# Patient Record
Sex: Female | Born: 1940
Health system: Southern US, Community
[De-identification: ages and names within clinical notes are randomized; demographics above are authoritative.]

## PROBLEM LIST (undated history)

## (undated) DIAGNOSIS — G8929 Other chronic pain: Secondary | ICD-10-CM

## (undated) DIAGNOSIS — E785 Hyperlipidemia, unspecified: Secondary | ICD-10-CM

## (undated) DIAGNOSIS — K219 Gastro-esophageal reflux disease without esophagitis: Secondary | ICD-10-CM

## (undated) DIAGNOSIS — M545 Low back pain, unspecified: Secondary | ICD-10-CM

## (undated) DIAGNOSIS — M810 Age-related osteoporosis without current pathological fracture: Secondary | ICD-10-CM

## (undated) DIAGNOSIS — C801 Malignant (primary) neoplasm, unspecified: Secondary | ICD-10-CM

## (undated) DIAGNOSIS — J449 Chronic obstructive pulmonary disease, unspecified: Secondary | ICD-10-CM

## (undated) DIAGNOSIS — I10 Essential (primary) hypertension: Secondary | ICD-10-CM

## (undated) DIAGNOSIS — R911 Solitary pulmonary nodule: Secondary | ICD-10-CM

## (undated) HISTORY — PX: ABDOMINAL HYSTERECTOMY: SHX81

## (undated) HISTORY — DX: Other chronic pain: G89.29

## (undated) HISTORY — DX: Chronic obstructive pulmonary disease, unspecified: J44.9

## (undated) HISTORY — DX: Essential (primary) hypertension: I10

## (undated) HISTORY — PX: TOTAL HIP ARTHROPLASTY: SHX124

## (undated) HISTORY — DX: Low back pain: M54.5

## (undated) HISTORY — DX: Low back pain, unspecified: M54.50

## (undated) HISTORY — DX: Age-related osteoporosis without current pathological fracture: M81.0

## (undated) HISTORY — DX: Hyperlipidemia, unspecified: E78.5

## (undated) HISTORY — DX: Gastro-esophageal reflux disease without esophagitis: K21.9

---

## 2012-12-15 ENCOUNTER — Telehealth: Payer: Self-pay | Admitting: Nurse Practitioner

## 2012-12-15 NOTE — Telephone Encounter (Signed)
Mmm to address 

## 2012-12-15 NOTE — Telephone Encounter (Signed)
Have patient call pharmacy and send fax- no list of meds in epic to order

## 2012-12-15 NOTE — Telephone Encounter (Signed)
Pt will have pharmacy send over request for refills.

## 2012-12-16 ENCOUNTER — Other Ambulatory Visit: Payer: Self-pay | Admitting: Nurse Practitioner

## 2013-01-21 ENCOUNTER — Encounter: Payer: Self-pay | Admitting: Nurse Practitioner

## 2013-01-21 ENCOUNTER — Ambulatory Visit (INDEPENDENT_AMBULATORY_CARE_PROVIDER_SITE_OTHER): Payer: Medicare Other | Admitting: Nurse Practitioner

## 2013-01-21 VITALS — BP 141/76 | HR 98 | Temp 98.0°F | Ht 61.0 in | Wt 180.0 lb

## 2013-01-21 DIAGNOSIS — I10 Essential (primary) hypertension: Secondary | ICD-10-CM | POA: Insufficient documentation

## 2013-01-21 MED ORDER — AMLODIPINE BESYLATE 2.5 MG PO TABS: 2.5000 mg | ORAL_TABLET | Freq: Every day | ORAL | Status: DC

## 2013-01-21 NOTE — Patient Instructions (Addendum)

## 2013-01-21 NOTE — Progress Notes (Signed)
  Subjective:    Patient ID: Jacqueline Kidd, female    DOB: [birth year], 72 y.o.   MRN: [id number]  Hypertension This is a chronic problem. The current episode started more than 1 year ago. The problem has been gradually improving since onset. The problem is controlled. Associated symptoms include palpitations. Pertinent negatives include no anxiety, blurred vision or shortness of breath. Risk factors for coronary artery disease include post-menopausal state. Past treatments include calcium channel blockers. The current treatment provides moderate improvement. There is no history of heart failure or a thyroid problem.      Review of Systems  Eyes: Negative for blurred vision.  Respiratory: Negative for shortness of breath.   Cardiovascular: Positive for palpitations.  All other systems reviewed and are negative.       Objective:   Physical Exam  Constitutional: She is oriented to person, place, and time. She appears well-developed and well-nourished.  HENT:  Head: Normocephalic.  Right Ear: External ear normal.  Left Ear: External ear normal.  Mouth/Throat: Oropharynx is clear and moist.  Eyes: Pupils are equal, round, and reactive to light.  Neck: Normal range of motion. Neck supple. No thyromegaly present.  Cardiovascular: Normal rate, regular rhythm, normal heart sounds and intact distal pulses.   Pulmonary/Chest: Effort normal and breath sounds normal.  Abdominal: Soft. Bowel sounds are normal. She exhibits no distension. There is no tenderness.  Musculoskeletal: Normal range of motion. She exhibits edema (trace amt of on LLE). She exhibits no tenderness.  Neurological: She is alert and oriented to person, place, and time.  Skin: Skin is warm and dry.  Psychiatric: She has a normal mood and affect. Her behavior is normal. Judgment and thought content normal.     BP 141/76  Pulse 98  Temp(Src) 98 F (36.7 C) (Oral)  Ht 5\' 1"  (1.549 m)  Wt 180 lb (81.647 kg)  BMI 34.03  kg/m2      Assessment & Plan:  1. Hypertension Low Na+ diet Low fat diet and exercise - amLODipine (NORVASC) 2.5 MG tablet; Take 1 tablet (2.5 mg total) by mouth daily.  Dispense: 90 tablet; Refill: 1 - COMPLETE METABOLIC PANEL WITH GFR - NMR Lipoprofile with Lipids  Mary-Margaret Daphine Deutscher, FNP

## 2013-01-22 LAB — COMPLETE METABOLIC PANEL WITH GFR
AST: 20 U/L (ref 0–37)
Albumin: 4.2 g/dL (ref 3.5–5.2)
Alkaline Phosphatase: 91 U/L (ref 39–117)
BUN: 9 mg/dL (ref 6–23)
Creat: 0.78 mg/dL (ref 0.50–1.10)
GFR, Est Non African American: 76 mL/min
Glucose, Bld: 120 mg/dL — ABNORMAL HIGH (ref 70–99)
Total Bilirubin: 0.6 mg/dL (ref 0.3–1.2)

## 2013-01-25 LAB — NMR LIPOPROFILE WITH LIPIDS
Cholesterol, Total: 171 mg/dL (ref <200)
HDL Size: 8.8 nm — ABNORMAL LOW (ref >=9.2)
LDL (calc): 83 mg/dL (ref <100)
LDL Particle Number: 1204 nmol/L — ABNORMAL HIGH (ref <1000)
LP-IR Score: 57 — ABNORMAL HIGH (ref <=45)
Large VLDL-P: 4.3 nmol/L — ABNORMAL HIGH (ref <=2.7)
Triglycerides: 230 mg/dL — ABNORMAL HIGH (ref <150)
VLDL Size: 43.8 nm (ref <=46.6)

## 2013-09-14 ENCOUNTER — Other Ambulatory Visit: Payer: Self-pay | Admitting: Nurse Practitioner

## 2013-09-16 NOTE — Telephone Encounter (Signed)
Last seen 07/14

## 2013-09-16 NOTE — Telephone Encounter (Signed)
Patient NTBS for follow up and lab work  

## 2013-12-13 ENCOUNTER — Telehealth: Payer: Self-pay | Admitting: Nurse Practitioner

## 2013-12-13 NOTE — Telephone Encounter (Signed)
Appt scheduled. Patient aware. 

## 2013-12-21 ENCOUNTER — Ambulatory Visit (INDEPENDENT_AMBULATORY_CARE_PROVIDER_SITE_OTHER): Payer: Medicare Other | Admitting: Nurse Practitioner

## 2013-12-21 ENCOUNTER — Encounter: Payer: Self-pay | Admitting: Nurse Practitioner

## 2013-12-21 VITALS — BP 132/70 | HR 98 | Temp 98.9°F | Ht 61.0 in | Wt 175.4 lb

## 2013-12-21 DIAGNOSIS — I10 Essential (primary) hypertension: Secondary | ICD-10-CM

## 2013-12-21 DIAGNOSIS — Z713 Dietary counseling and surveillance: Secondary | ICD-10-CM

## 2013-12-21 DIAGNOSIS — Z6833 Body mass index (BMI) 33.0-33.9, adult: Secondary | ICD-10-CM

## 2013-12-21 DIAGNOSIS — Z13820 Encounter for screening for osteoporosis: Secondary | ICD-10-CM

## 2013-12-21 MED ORDER — AMLODIPINE BESYLATE 2.5 MG PO TABS: ORAL_TABLET | ORAL | Status: DC

## 2013-12-21 NOTE — Progress Notes (Signed)
  Subjective:    Patient ID: Jacqueline Kidd, female    DOB: 05-Nov-1940, 73 y.o.   MRN: [id number]  Patient here today for follow up of chronic medical problems. No complaints today.  Hypertension This is a chronic problem. The current episode started more than 1 year ago. The problem has been gradually improving since onset. The problem is controlled. Associated symptoms include palpitations. Pertinent negatives include no anxiety, blurred vision or shortness of breath. Risk factors for coronary artery disease include post-menopausal state. Past treatments include calcium channel blockers. The current treatment provides moderate improvement. There is no history of heart failure or a thyroid problem.      Review of Systems  Eyes: Negative for blurred vision.  Respiratory: Negative for shortness of breath.   Cardiovascular: Positive for palpitations.  All other systems reviewed and are negative.      Objective:   Physical Exam  Constitutional: She is oriented to person, place, and time. She appears well-developed and well-nourished.  HENT:  Head: Normocephalic.  Right Ear: External ear normal.  Left Ear: External ear normal.  Mouth/Throat: Oropharynx is clear and moist.  Eyes: Pupils are equal, round, and reactive to light.  Neck: Normal range of motion. Neck supple. No thyromegaly present.  Cardiovascular: Normal rate, regular rhythm, normal heart sounds and intact distal pulses.   Pulmonary/Chest: Effort normal and breath sounds normal.  Abdominal: Soft. Bowel sounds are normal. She exhibits no distension. There is no tenderness.  Musculoskeletal: Normal range of motion. She exhibits edema (trace amt of on LLE). She exhibits no tenderness.  Neurological: She is alert and oriented to person, place, and time.  Skin: Skin is warm and dry.  Psychiatric: She has a normal mood and affect. Her behavior is normal. Judgment and thought content normal.     BP 132/70  Pulse 98  Temp(Src)  98.9 F (37.2 C) (Oral)  Ht _0  (1.549 m)  Wt 175 lb 6.4 oz (79.561 kg)  BMI 33.16 kg/m2      Assessment & Plan:   1. Hypertension   2. Osteoporosis screening   3. BMI 33.0-33.9,adult   4. Weight loss counseling, encounter for    Orders Placed This Encounter  Procedures  . DG Bone Density    Standing Status: Future     Number of Occurrences:      Standing Expiration Date: 02/20/2015    Order Specific Question:  Reason for Exam (SYMPTOM  OR DIAGNOSIS REQUIRED)    Answer:  screening    Order Specific Question:  Preferred imaging location?    Answer:  Internal  . CMP14+EGFR  . NMR, lipoprofile   Meds ordered this encounter  Medications  . amLODipine (NORVASC) 2.5 MG tablet    Sig: TAKE 1 TABLET (2.5 MG TOTAL) BY MOUTH DAILY.    Dispense:  90 tablet    Refill:  1    Order Specific Question:  Supervising Provider    Answer:  Joycelyn Man   To schedule mammo on way out Labs pending Health maintenance reviewed Diet and exercise encouraged Continue all meds Follow up  In 3 months    [place], FNP

## 2013-12-21 NOTE — Patient Instructions (Signed)
Osteoporosis Throughout your life, your body breaks down old bone and replaces it with new bone. As you get older, your body does not replace bone as quickly as it breaks it down. By the age of 30 years, most people begin to gradually lose bone because of the imbalance between bone loss and replacement. Some people lose more bone than others. Bone loss beyond a specified normal degree is considered osteoporosis.  Osteoporosis affects the strength and durability of your bones. The inside of the ends of your bones and your flat bones, like the bones of your pelvis, look like honeycomb, filled with tiny open spaces. As bone loss occurs, your bones become less dense. This means that the open spaces inside your bones become bigger and the walls between these spaces become thinner. This makes your bones weaker. Bones of a person with osteoporosis can become so weak that they can break (fracture) during minor accidents, such as a simple fall. CAUSES  The following factors have been associated with the development of osteoporosis:  Smoking.  Drinking more than 2 alcoholic drinks several days per week.  Long-term use of certain medicines:  Corticosteroids.  Chemotherapy medicines.  Thyroid medicines.  Antiepileptic medicines.  Gonadal hormone suppression medicine.  Immunosuppression medicine.  Being underweight.  Lack of physical activity.  Lack of exposure to the sun. This can lead to vitamin D deficiency.  Certain medical conditions:  Certain inflammatory bowel diseases, such as Crohn disease and ulcerative colitis.  Diabetes.  Hyperthyroidism.  Hyperparathyroidism. RISK FACTORS Anyone can develop osteoporosis. However, the following factors can increase your risk of developing osteoporosis:  Gender--Women are at higher risk than men.  Age--Being older than 50 years increases your risk.  Ethnicity--White and Asian people have an increased risk.  Weight --Being extremely  underweight can increase your risk of osteoporosis.  Family history of osteoporosis--Having a family member who has developed osteoporosis can increase your risk. SYMPTOMS  Usually, people with osteoporosis have no symptoms.  DIAGNOSIS  Signs during a physical exam that may prompt your caregiver to suspect osteoporosis include:  Decreased height. This is usually caused by the compression of the bones that form your spine (vertebrae) because they have weakened and become fractured.  A curving or rounding of the upper back (kyphosis). To confirm signs of osteoporosis, your caregiver may request a procedure that uses 2 low-dose X-ray beams with different levels of energy to measure your bone mineral density (dual-energy X-ray absorptiometry [DXA]). Also, your caregiver may check your level of vitamin D. TREATMENT  The goal of osteoporosis treatment is to strengthen bones in order to decrease the risk of bone fractures. There are different types of medicines available to help achieve this goal. Some of these medicines work by slowing the processes of bone loss. Some medicines work by increasing bone density. Treatment also involves making sure that your levels of calcium and vitamin D are adequate. PREVENTION  There are things you can do to help prevent osteoporosis. Adequate intake of calcium and vitamin D can help you achieve optimal bone mineral density. Regular exercise can also help, especially resistance and weight-bearing activities. If you smoke, quitting smoking is an important part of osteoporosis prevention. MAKE SURE YOU:  Understand these instructions.  Will watch your condition.  Will get help right away if you are not doing well or get worse. FOR MORE INFORMATION www.osteo.org and www.nof.org Document Released: 04/02/2005 Document Revised: 10/18/2012 Document Reviewed: 06/07/2011 [employer] Patient Information 2015 [employer]. This information is not   intended to replace advice  given to you by your health care provider. Make sure you discuss any questions you have with your health care provider.  

## 2013-12-22 LAB — CMP14+EGFR
A/G RATIO: 1.4 (ref 1.1–2.5)
ALT: 15 IU/L (ref 0–32)
AST: 20 IU/L (ref 0–40)
Albumin: 4.3 g/dL (ref 3.5–4.8)
Alkaline Phosphatase: 93 IU/L (ref 39–117)
BILIRUBIN TOTAL: 0.4 mg/dL (ref 0.0–1.2)
BUN/Creatinine Ratio: 12 (ref 11–26)
BUN: 10 mg/dL (ref 8–27)
CO2: 24 mmol/L (ref 18–29)
CREATININE: 0.84 mg/dL (ref 0.57–1.00)
Calcium: 9.3 mg/dL (ref 8.7–10.3)
Chloride: 100 mmol/L (ref 97–108)
GFR, EST AFRICAN AMERICAN: 80 mL/min/{1.73_m2} (ref >59)
GFR, EST NON AFRICAN AMERICAN: 69 mL/min/{1.73_m2} (ref >59)
GLOBULIN, TOTAL: 3.1 g/dL (ref 1.5–4.5)
GLUCOSE: 102 mg/dL — AB (ref 65–99)
POTASSIUM: 4.2 mmol/L (ref 3.5–5.2)
Sodium: 141 mmol/L (ref 134–144)
TOTAL PROTEIN: 7.4 g/dL (ref 6.0–8.5)

## 2013-12-22 LAB — NMR, LIPOPROFILE
CHOLESTEROL: 158 mg/dL (ref 100–199)
HDL Cholesterol by NMR: 39 mg/dL — ABNORMAL LOW (ref >39)
HDL PARTICLE NUMBER: 30.2 umol/L — AB (ref >=30.5)
LDL Particle Number: 959 nmol/L (ref <1000)
LDL SIZE: 20.8 nm (ref >20.5)
LDLC SERPL CALC-MCNC: 77 mg/dL (ref 0–99)
LP-IR Score: 50 — ABNORMAL HIGH (ref <=45)
SMALL LDL PARTICLE NUMBER: 463 nmol/L (ref <=527)
TRIGLYCERIDES BY NMR: 212 mg/dL — AB (ref 0–149)

## 2014-01-17 ENCOUNTER — Ambulatory Visit: Payer: Medicare Other

## 2014-05-27 ENCOUNTER — Ambulatory Visit: Payer: Medicare Other

## 2014-06-21 ENCOUNTER — Ambulatory Visit (INDEPENDENT_AMBULATORY_CARE_PROVIDER_SITE_OTHER): Payer: Medicare Other

## 2014-06-21 ENCOUNTER — Ambulatory Visit (INDEPENDENT_AMBULATORY_CARE_PROVIDER_SITE_OTHER): Payer: Medicare Other | Admitting: Pharmacist

## 2014-06-21 ENCOUNTER — Ambulatory Visit: Payer: Medicare Other | Admitting: *Deleted

## 2014-06-21 ENCOUNTER — Encounter: Payer: Self-pay | Admitting: Pharmacist

## 2014-06-21 VITALS — BP 130/72 | HR 77 | Ht 61.0 in | Wt 172.0 lb

## 2014-06-21 DIAGNOSIS — Z23 Encounter for immunization: Secondary | ICD-10-CM

## 2014-06-21 DIAGNOSIS — I10 Essential (primary) hypertension: Secondary | ICD-10-CM

## 2014-06-21 DIAGNOSIS — Z13820 Encounter for screening for osteoporosis: Secondary | ICD-10-CM

## 2014-06-21 DIAGNOSIS — M810 Age-related osteoporosis without current pathological fracture: Secondary | ICD-10-CM | POA: Insufficient documentation

## 2014-06-21 LAB — HM DEXA SCAN

## 2014-06-21 MED ORDER — AMLODIPINE BESYLATE 2.5 MG PO TABS: ORAL_TABLET | ORAL | Status: DC

## 2014-06-21 NOTE — Progress Notes (Signed)
Patient ID: Jacqueline Kidd, female   DOB: 1941-02-02, 73 y.o.   MRN: [id number]  [facility] Current Height: Height: 5\' 1"  (154.9 cm)      Max Lifetime Height:  5\' 2"  Current Weight: Weight: 172 lb (78.019 kg)       Ethnicity:Caucasian    HPI: Patient with ostoeporosis who has refused treatment in past.  She is here today to have DEXA repeated.    Back Pain?  No       Kyphosis?  No Prior fracture?  Yes - clavicle and spine / vertebrae Med(s) for Osteoporosis/Osteopenia:  none Med(s) previously tried for Osteoporosis/Osteopenia:  Fosamax - caused bone / muscle pain                                                             PMH: Age at menopause:  45's Hysterectomy?  Yes Oophorectomy?  Yes HRT? No Steroid Use?  No Thyroid med?  No History of cancer?  No History of digestive disorders (ie Crohn's)?  No Current or previous eating disorders?  No Last Vitamin D Result:  47 (2014) Last GFR Result:  69 (2015)   FH/SH: Family history of osteoporosis?  No Parent with history of hip fracture?  No Family history of breast cancer?  No Exercise?  No Smoking?  No - denies but smells like smoke - possbily second hand smoke Alcohol?  No    Calcium Assessment Calcium Intake  # of servings/day  Calcium mg  Milk (8 oz) 0  x  300  = 0  Yogurt (4 oz) 0 x  200 = 0  Cheese (1 oz) 0 x  200 = 0  Other Calcium sources   250mg   Ca supplement tid = 1500mg    Estimated calcium intake per day 1570mg     DEXA Results Date of Test T-Score for AP Spine L1-L4 T-Score for Total Left Hip T-Score for Total Right Hip  06/21/2014 -2.7 -2.8 --  06/16/2012 -2.7 -2.8 --  05/29/2010 -2.7 -2.8 --        Assessment: Osteoporosis - BMD stable but high risk for fracture  Recommendations: 1.  Discussed treatment options.  Patient continues to decline treatment for osteoporosis 2.  continue calcium 1200mg  daily through supplementation or diet.  3.  recommend weight bearing exercise - 30 minutes at  least 4 days per week.   4.  Counseled and educated about fall risk and prevention. 5.   Influenza and Prevnar 13 vaccines given in office today   Recheck DEXA:  2 years  Time spent counseling patient:  30 minutes   Cherre Robins, PharmD, CPP

## 2014-06-21 NOTE — Patient Instructions (Signed)

## 2014-09-13 ENCOUNTER — Other Ambulatory Visit: Payer: Self-pay | Admitting: Pharmacist

## 2014-12-19 ENCOUNTER — Encounter: Payer: Self-pay | Admitting: Family

## 2014-12-19 ENCOUNTER — Ambulatory Visit (INDEPENDENT_AMBULATORY_CARE_PROVIDER_SITE_OTHER): Payer: Medicare Other | Admitting: Family

## 2014-12-19 VITALS — BP 140/89 | HR 97 | Temp 98.0°F | Ht 61.0 in | Wt 177.8 lb

## 2014-12-19 DIAGNOSIS — I10 Essential (primary) hypertension: Secondary | ICD-10-CM | POA: Diagnosis not present

## 2014-12-19 DIAGNOSIS — M810 Age-related osteoporosis without current pathological fracture: Secondary | ICD-10-CM | POA: Diagnosis not present

## 2014-12-19 MED ORDER — AMLODIPINE BESYLATE 2.5 MG PO TABS: ORAL_TABLET | ORAL | Status: DC

## 2014-12-19 NOTE — Patient Instructions (Addendum)
Health Maintenance Adopting a healthy lifestyle and getting preventive care can go a long way to promote health and wellness. Talk with your health care provider about what schedule of regular examinations is right for you. This is a good chance for you to check in with your provider about disease prevention and staying healthy. In between checkups, there are plenty of things you can do on your own. Experts have done a lot of research about which lifestyle changes and preventive measures are most likely to keep you healthy. Ask your health care provider for more information. WEIGHT AND DIET  Eat a healthy diet  Be sure to include plenty of vegetables, fruits, low-fat dairy products, and lean protein.  Do not eat a lot of foods high in solid fats, added sugars, or salt.  Get regular exercise. This is one of the most important things you can do for your health.  Most adults should exercise for at least 150 minutes each week. The exercise should increase your heart rate and make you sweat (moderate-intensity exercise).  Most adults should also do strengthening exercises at least twice a week. This is in addition to the moderate-intensity exercise.  Maintain a healthy weight  Body mass index (BMI) is a measurement that can be used to identify possible weight problems. It estimates body fat based on height and weight. Your health care provider can help determine your BMI and help you achieve or maintain a healthy weight.  For females 25 years of age and older:   A BMI below 18.5 is considered underweight.  A BMI of 18.5 to 24.9 is normal.  A BMI of 25 to 29.9 is considered overweight.  A BMI of 30 and above is considered obese.  Watch levels of cholesterol and blood lipids  You should start having your blood tested for lipids and cholesterol at 75 years of age, then have this test every 5 years.  You may need to have your cholesterol levels checked more often if:  Your lipid or  cholesterol levels are high.  You are older than 75 years of age.  You are at high risk for heart disease.  CANCER SCREENING   Lung Cancer  Lung cancer screening is recommended for adults 97-92 years old who are at high risk for lung cancer because of a history of smoking.  A yearly low-dose CT scan of the lungs is recommended for people who:  Currently smoke.  Have quit within the past 15 years.  Have at least a 30-pack-year history of smoking. A pack year is smoking an average of one pack of cigarettes a day for 1 year.  Yearly screening should continue until it has been 15 years since you quit.  Yearly screening should stop if you develop a health problem that would prevent you from having lung cancer treatment.  Breast Cancer  Practice breast self-awareness. This means understanding how your breasts normally appear and feel.  It also means doing regular breast self-exams. Let your health care provider know about any changes, no matter how small.  If you are in your 20s or 30s, you should have a clinical breast exam (CBE) by a health care provider every 1-3 years as part of a regular health exam.  If you are 76 or older, have a CBE every year. Also consider having a breast X-ray (mammogram) every year.  If you have a family history of breast cancer, talk to your health care provider about genetic screening.  If you are  at high risk for breast cancer, talk to your health care provider about having an MRI and a mammogram every year.  Breast cancer gene (BRCA) assessment is recommended for women who have family members with BRCA-related cancers. BRCA-related cancers include:  Breast.  Ovarian.  Tubal.  Peritoneal cancers.  Results of the assessment will determine the need for genetic counseling and BRCA1 and BRCA2 testing. Cervical Cancer Routine pelvic examinations to screen for cervical cancer are no longer recommended for nonpregnant women who are considered low  risk for cancer of the pelvic organs (ovaries, uterus, and vagina) and who do not have symptoms. A pelvic examination may be necessary if you have symptoms including those associated with pelvic infections. Ask your health care provider if a screening pelvic exam is right for you.   The Pap test is the screening test for cervical cancer for women who are considered at risk.  If you had a hysterectomy for a problem that was not cancer or a condition that could lead to cancer, then you no longer need Pap tests.  If you are older than 65 years, and you have had normal Pap tests for the past 10 years, you no longer need to have Pap tests.  If you have had past treatment for cervical cancer or a condition that could lead to cancer, you need Pap tests and screening for cancer for at least 20 years after your treatment.  If you no longer get a Pap test, assess your risk factors if they change (such as having a new sexual partner). This can affect whether you should start being screened again.  Some women have medical problems that increase their chance of getting cervical cancer. If this is the case for you, your health care provider may recommend more frequent screening and Pap tests.  The human papillomavirus (HPV) test is another test that may be used for cervical cancer screening. The HPV test looks for the virus that can cause cell changes in the cervix. The cells collected during the Pap test can be tested for HPV.  The HPV test can be used to screen women 30 years of age and older. Getting tested for HPV can extend the interval between normal Pap tests from three to five years.  An HPV test also should be used to screen women of any age who have unclear Pap test results.  After 75 years of age, women should have HPV testing as often as Pap tests.  Colorectal Cancer  This type of cancer can be detected and often prevented.  Routine colorectal cancer screening usually begins at 75 years of  age and continues through 75 years of age.  Your health care provider may recommend screening at an earlier age if you have risk factors for colon cancer.  Your health care provider may also recommend using home test kits to check for hidden blood in the stool.  A small camera at the end of a tube can be used to examine your colon directly (sigmoidoscopy or colonoscopy). This is done to check for the earliest forms of colorectal cancer.  Routine screening usually begins at age 50.  Direct examination of the colon should be repeated every 5-10 years through 75 years of age. However, you may need to be screened more often if early forms of precancerous polyps or small growths are found. Skin Cancer  Check your skin from head to toe regularly.  Tell your health care provider about any new moles or changes in   moles, especially if there is a change in a mole's shape or color.  Also tell your health care provider if you have a mole that is larger than the size of a pencil eraser.  Always use sunscreen. Apply sunscreen liberally and repeatedly throughout the day.  Protect yourself by wearing long sleeves, pants, a wide-brimmed hat, and sunglasses whenever you are outside. HEART DISEASE, DIABETES, AND HIGH BLOOD PRESSURE   Have your blood pressure checked at least every 1-2 years. High blood pressure causes heart disease and increases the risk of stroke.  If you are between 75 years and 42 years old, ask your health care provider if you should take aspirin to prevent strokes.  Have regular diabetes screenings. This involves taking a blood sample to check your fasting blood sugar level.  If you are at a normal weight and have a low risk for diabetes, have this test once every three years after 75 years of age.  If you are overweight and have a high risk for diabetes, consider being tested at a younger age or more often. PREVENTING INFECTION  Hepatitis B  If you have a higher risk for  hepatitis B, you should be screened for this virus. You are considered at high risk for hepatitis B if:  You were born in a country where hepatitis B is common. Ask your health care provider which countries are considered high risk.  Your parents were born in a high-risk country, and you have not been immunized against hepatitis B (hepatitis B vaccine).  You have HIV or AIDS.  You use needles to inject street drugs.  You live with someone who has hepatitis B.  You have had sex with someone who has hepatitis B.  You get hemodialysis treatment.  You take certain medicines for conditions, including cancer, organ transplantation, and autoimmune conditions. Hepatitis C  Blood testing is recommended for:  Everyone born from [birth year] through 1966.  Anyone with known risk factors for hepatitis C. Sexually transmitted infections (STIs)  You should be screened for sexually transmitted infections (STIs) including gonorrhea and chlamydia if:  You are sexually active and are younger than 75 years of age.  You are older than 75 years of age and your health care provider tells you that you are at risk for this type of infection.  Your sexual activity has changed since you were last screened and you are at an increased risk for chlamydia or gonorrhea. Ask your health care provider if you are at risk.  If you do not have HIV, but are at risk, it may be recommended that you take a prescription medicine daily to prevent HIV infection. This is called pre-exposure prophylaxis (PrEP). You are considered at risk if:  You are sexually active and do not regularly use condoms or know the HIV status of your partner(s).  You take drugs by injection.  You are sexually active with a partner who has HIV. Talk with your health care provider about whether you are at high risk of being infected with HIV. If you choose to begin PrEP, you should first be tested for HIV. You should then be tested every 3 months for  as long as you are taking PrEP.  PREGNANCY   If you are premenopausal and you may become pregnant, ask your health care provider about preconception counseling.  If you may become pregnant, take 400 to 800 micrograms (mcg) of folic acid every day.  If you want to prevent pregnancy, talk to your  health care provider about birth control (contraception). OSTEOPOROSIS AND MENOPAUSE   Osteoporosis is a disease in which the bones lose minerals and strength with aging. This can result in serious bone fractures. Your risk for osteoporosis can be identified using a bone density scan.  If you are 65 years of age or older, or if you are at risk for osteoporosis and fractures, ask your health care provider if you should be screened.  Ask your health care provider whether you should take a calcium or vitamin D supplement to lower your risk for osteoporosis.  Menopause may have certain physical symptoms and risks.  Hormone replacement therapy may reduce some of these symptoms and risks. Talk to your health care provider about whether hormone replacement therapy is right for you.  HOME CARE INSTRUCTIONS   Schedule regular health, dental, and eye exams.  Stay current with your immunizations.   Do not use any tobacco products including cigarettes, chewing tobacco, or electronic cigarettes.  If you are pregnant, do not drink alcohol.  If you are breastfeeding, limit how much and how often you drink alcohol.  Limit alcohol intake to no more than 1 drink per day for nonpregnant women. One drink equals 12 ounces of beer, 5 ounces of wine, or 1 ounces of hard liquor.  Do not use street drugs.  Do not share needles.  Ask your health care provider for help if you need support or information about quitting drugs.  Tell your health care provider if you often feel depressed.  Tell your health care provider if you have ever been abused or do not feel safe at home. Document Released: 01/06/2011  Document Revised: 11/07/2013 Document Reviewed: 05/25/2013 Exit[employer] Patient Information 2015 [employer]. This information is not intended to replace advice given to you by your health care provider. Make sure you discuss any questions you have with your health care provider. Hypertension Hypertension, commonly called high blood pressure, is when the force of blood pumping through your arteries is too strong. Your arteries are the blood vessels that carry blood from your heart throughout your body. A blood pressure reading consists of a higher number over a lower number, such as 110/72. The higher number (systolic) is the pressure inside your arteries when your heart pumps. The lower number (diastolic) is the pressure inside your arteries when your heart relaxes. Ideally you want your blood pressure below 120/80. Hypertension forces your heart to work harder to pump blood. Your arteries may become narrow or stiff. Having hypertension puts you at risk for heart disease, stroke, and other problems.  RISK FACTORS Some risk factors for high blood pressure are controllable. Others are not.  Risk factors you cannot control include:   Race. You may be at higher risk if you are African American.  Age. Risk increases with age.  Gender. Men are at higher risk than women before age 45 years. After age 65, women are at higher risk than men. Risk factors you can control include:  Not getting enough exercise or physical activity.  Being overweight.  Getting too much fat, sugar, calories, or salt in your diet.  Drinking too much alcohol. SIGNS AND SYMPTOMS Hypertension does not usually cause signs or symptoms. Extremely high blood pressure (hypertensive crisis) may cause headache, anxiety, shortness of breath, and nosebleed. DIAGNOSIS  To check if you have hypertension, your health care provider will measure your blood pressure while you are seated, with your arm held at the level of your heart. It    should be measured at least twice using the same arm. Certain conditions can cause a difference in blood pressure between your right and left arms. A blood pressure reading that is higher than normal on one occasion does not mean that you need treatment. If one blood pressure reading is high, ask your health care provider about having it checked again. TREATMENT  Treating high blood pressure includes making lifestyle changes and possibly taking medicine. Living a healthy lifestyle can help lower high blood pressure. You may need to change some of your habits. Lifestyle changes may include:  Following the DASH diet. This diet is high in fruits, vegetables, and whole grains. It is low in salt, red meat, and added sugars.  Getting at least 2 hours of brisk physical activity every week.  Losing weight if necessary.  Not smoking.  Limiting alcoholic beverages.  Learning ways to reduce stress. If lifestyle changes are not enough to get your blood pressure under control, your health care provider may prescribe medicine. You may need to take more than one. Work closely with your health care provider to understand the risks and benefits. HOME CARE INSTRUCTIONS  Have your blood pressure rechecked as directed by your health care provider.   Take medicines only as directed by your health care provider. Follow the directions carefully. Blood pressure medicines must be taken as prescribed. The medicine does not work as well when you skip doses. Skipping doses also puts you at risk for problems.   Do not smoke.   Monitor your blood pressure at home as directed by your health care provider. SEEK MEDICAL CARE IF:   You think you are having a reaction to medicines taken.  You have recurrent headaches or feel dizzy.  You have swelling in your ankles.  You have trouble with your vision. SEEK IMMEDIATE MEDICAL CARE IF:  You develop a severe headache or confusion.  You have unusual weakness,  numbness, or feel faint.  You have severe chest or abdominal pain.  You vomit repeatedly.  You have trouble breathing. MAKE SURE YOU:   Understand these instructions.  Will watch your condition.  Will get help right away if you are not doing well or get worse. Document Released: 06/23/2005 Document Revised: 11/07/2013 Document Reviewed: 04/15/2013 Exit[employer] Patient Information 2015 [employer]. This information is not intended to replace advice given to you by your health care provider. Make sure you discuss any questions you have with your health care provider.  

## 2014-12-19 NOTE — Progress Notes (Signed)
   Subjective:    Patient ID: Jacqueline Kidd, female    DOB: [birth year], 74 y.o.   MRN: [id number]  Pt presents to the office today for chronic follow up.  Hypertension This is a chronic problem. The current episode started more than 1 year ago. The problem has been waxing and waning since onset. The problem is uncontrolled. Associated symptoms include peripheral edema ("at times"). Pertinent negatives include no anxiety, headaches, palpitations or shortness of breath. Risk factors for coronary artery disease include obesity and post-menopausal state. Past treatments include calcium channel blockers. The current treatment provides mild improvement. There is no history of kidney disease, CAD/MI, CVA, heart failure or a thyroid problem. There is no history of sleep apnea.      Review of Systems  Constitutional: Negative.   HENT: Negative.   Eyes: Negative.   Respiratory: Negative.  Negative for shortness of breath.   Cardiovascular: Negative.  Negative for palpitations.  Gastrointestinal: Negative.   Endocrine: Negative.   Genitourinary: Negative.   Musculoskeletal: Negative.   Neurological: Negative.  Negative for headaches.  Hematological: Negative.   Psychiatric/Behavioral: Negative.   All other systems reviewed and are negative.      Objective:   Physical Exam  Constitutional: She is oriented to person, place, and time. She appears well-developed and well-nourished. No distress.  HENT:  Head: Normocephalic and atraumatic.  Right Ear: External ear normal.  Left Ear: External ear normal.  Nose: Nose normal.  Mouth/Throat: Oropharynx is clear and moist.  Eyes: Pupils are equal, round, and reactive to light.  Neck: Normal range of motion. Neck supple. No thyromegaly present.  Cardiovascular: Normal rate, regular rhythm, normal heart sounds and intact distal pulses.   No murmur heard. Pulmonary/Chest: Effort normal and breath sounds normal. No respiratory distress. She has no  wheezes.  Abdominal: Soft. Bowel sounds are normal. She exhibits no distension. There is no tenderness.  Musculoskeletal: Normal range of motion. She exhibits edema. She exhibits no tenderness.  Neurological: She is alert and oriented to person, place, and time. She has normal reflexes. No cranial nerve deficit.  Skin: Skin is warm and dry.  Psychiatric: She has a normal mood and affect. Her behavior is normal. Judgment and thought content normal.  Vitals reviewed.   BP 140/89 mmHg  Pulse 97  Temp(Src) 98 F (36.7 C) (Oral)  Ht $R'5\' 1"'VF$  (1.549 m)  Wt 177 lb 12.8 oz (80.65 kg)  BMI 33.61 kg/m2       Assessment & Plan:  1. Essential hypertension - CMP14+EGFR - amLODipine (NORVASC) 2.5 MG tablet; TAKE 1 TABLET (2.5 MG TOTAL) BY MOUTH DAILY.  Dispense: 90 tablet; Refill: 4  2. Osteoporosis - CMP14+EGFR - Vit D  25 hydroxy (rtn osteoporosis monitoring)   Continue all meds Labs pending Health Maintenance reviewed Diet and exercise encouraged RTO 1 year  Evelina Dun, FNP

## 2014-12-20 LAB — CMP14+EGFR
ALT: 16 IU/L (ref 0–32)
AST: 20 IU/L (ref 0–40)
Albumin/Globulin Ratio: 1.4 (ref 1.1–2.5)
Albumin: 4.3 g/dL (ref 3.5–4.8)
Alkaline Phosphatase: 87 IU/L (ref 39–117)
BUN / CREAT RATIO: 13 (ref 11–26)
BUN: 10 mg/dL (ref 8–27)
Bilirubin Total: 0.4 mg/dL (ref 0.0–1.2)
CO2: 26 mmol/L (ref 18–29)
CREATININE: 0.8 mg/dL (ref 0.57–1.00)
Calcium: 9.8 mg/dL (ref 8.7–10.3)
Chloride: 100 mmol/L (ref 97–108)
GFR calc Af Amer: 84 mL/min/{1.73_m2} (ref >59)
GFR, EST NON AFRICAN AMERICAN: 73 mL/min/{1.73_m2} (ref >59)
GLOBULIN, TOTAL: 3 g/dL (ref 1.5–4.5)
Glucose: 105 mg/dL — ABNORMAL HIGH (ref 65–99)
Potassium: 4.6 mmol/L (ref 3.5–5.2)
Sodium: 141 mmol/L (ref 134–144)
Total Protein: 7.3 g/dL (ref 6.0–8.5)

## 2014-12-20 LAB — VITAMIN D 25 HYDROXY (VIT D DEFICIENCY, FRACTURES): Vit D, 25-Hydroxy: 38.3 ng/mL (ref 30.0–100.0)

## 2015-06-14 ENCOUNTER — Other Ambulatory Visit: Payer: Self-pay | Admitting: *Deleted

## 2015-06-14 DIAGNOSIS — Z1211 Encounter for screening for malignant neoplasm of colon: Secondary | ICD-10-CM

## 2015-08-07 ENCOUNTER — Other Ambulatory Visit: Payer: Medicare Other

## 2015-08-07 DIAGNOSIS — Z1231 Encounter for screening mammogram for malignant neoplasm of breast: Secondary | ICD-10-CM | POA: Diagnosis not present

## 2015-08-07 DIAGNOSIS — Z1212 Encounter for screening for malignant neoplasm of rectum: Secondary | ICD-10-CM | POA: Diagnosis not present

## 2015-08-07 LAB — HM MAMMOGRAPHY: HM Mammogram: NEGATIVE

## 2015-08-10 ENCOUNTER — Encounter: Payer: Self-pay | Admitting: *Deleted

## 2015-08-11 LAB — FECAL OCCULT BLOOD, IMMUNOCHEMICAL: Fecal Occult Bld: NEGATIVE

## 2016-03-09 ENCOUNTER — Other Ambulatory Visit: Payer: Self-pay | Admitting: Family

## 2016-03-09 DIAGNOSIS — I10 Essential (primary) hypertension: Secondary | ICD-10-CM

## 2016-03-21 ENCOUNTER — Encounter: Payer: Self-pay | Admitting: Nurse Practitioner

## 2016-03-21 ENCOUNTER — Ambulatory Visit (INDEPENDENT_AMBULATORY_CARE_PROVIDER_SITE_OTHER): Payer: Medicare Other | Admitting: Nurse Practitioner

## 2016-03-21 VITALS — BP 130/75 | HR 86 | Temp 97.8°F | Ht 61.0 in | Wt 176.0 lb

## 2016-03-21 DIAGNOSIS — I10 Essential (primary) hypertension: Secondary | ICD-10-CM

## 2016-03-21 DIAGNOSIS — R609 Edema, unspecified: Secondary | ICD-10-CM

## 2016-03-21 DIAGNOSIS — M810 Age-related osteoporosis without current pathological fracture: Secondary | ICD-10-CM

## 2016-03-21 DIAGNOSIS — Z6833 Body mass index (BMI) 33.0-33.9, adult: Secondary | ICD-10-CM

## 2016-03-21 MED ORDER — FUROSEMIDE 20 MG PO TABS
20.0000 mg | ORAL_TABLET | Freq: Every day | ORAL | 3 refills | Status: DC
Start: 2016-03-21 — End: 2016-07-19

## 2016-03-21 MED ORDER — AMLODIPINE BESYLATE 2.5 MG PO TABS: ORAL_TABLET | ORAL | 1 refills | Status: DC

## 2016-03-21 NOTE — Patient Instructions (Signed)

## 2016-03-21 NOTE — Addendum Note (Signed)
Addended by: Chevis Pretty on: 03/21/2016 03:17 PM   Modules accepted: Orders

## 2016-03-21 NOTE — Progress Notes (Signed)
Subjective:    Patient ID: [name], female    DOB: 03/03/1941, 75 y.o.   MRN: [id number]   Patient here today for follow up of chronic medical problems. No changes since last visit. No complaints  Outpatient Encounter Prescriptions as of 03/21/2016  Medication Sig  . amLODipine (NORVASC) 2.5 MG tablet TAKE 1 TABLET (2.5 MG TOTAL) BY MOUTH DAILY.  . calcium-vitamin D (CALCIUM + D) 250-125 MG-UNIT per tablet Take 1 tablet by mouth 3 (three) times daily.  . Omega-3 Fatty Acids (FISH OIL) 1000 MG CAPS Take 1 capsule by mouth daily.   No facility-administered encounter medications on file as of 03/21/2016.     Hypertension  This is a chronic problem. The current episode started more than 1 year ago. The problem is unchanged. The problem is controlled. Associated symptoms include malaise/fatigue. Pertinent negatives include no anxiety, chest pain or peripheral edema. There are no associated agents to hypertension. Risk factors for coronary artery disease include sedentary lifestyle. Past treatments include calcium channel blockers. The current treatment provides moderate improvement. Compliance problems include diet and exercise.  There is no history of CAD/MI, CVA or heart failure.  Osteoporosis   Patient is suppose to be on fosamax- was having trouble remembering to take sometimes, so she just quit taking it all together. Does very little exercise. Last dexa was 06/21/14.    Review of Systems  Constitutional: Positive for malaise/fatigue.  HENT: Negative.   Eyes: Negative.   Respiratory: Negative.   Cardiovascular: Negative.  Negative for chest pain.  Gastrointestinal: Negative.   Genitourinary: Negative.   Musculoskeletal: Negative for back pain.  Neurological: Negative.   Psychiatric/Behavioral: Negative.   All other systems reviewed and are negative.      Objective:   Physical Exam  Constitutional: She is oriented to person, place, and time. She appears well-developed and  well-nourished.  HENT:  Nose: Nose normal.  Mouth/Throat: Oropharynx is clear and moist.  Eyes: EOM are normal.  Neck: Trachea normal, normal range of motion and full passive range of motion without pain. Neck supple. No JVD present. Carotid bruit is not present. No thyromegaly present.  Cardiovascular: Normal rate, regular rhythm, normal heart sounds and intact distal pulses.  Exam reveals no gallop and no friction rub.   No murmur heard. Pulmonary/Chest: Effort normal and breath sounds normal.  Abdominal: Soft. Bowel sounds are normal. She exhibits no distension and no mass. There is no tenderness.  Musculoskeletal: Normal range of motion. She exhibits edema (1+ edema bil lower ext).  Lymphadenopathy:    She has no cervical adenopathy.  Neurological: She is alert and oriented to person, place, and time. She has normal reflexes.  Skin: Skin is warm and dry.  Psychiatric: She has a normal mood and affect. Her behavior is normal. Judgment and thought content normal.   BP 130/75   Pulse 86   Temp 97.8 F (36.6 C) (Oral)   Ht '5\' 1"'$  (1.549 m)   Wt 176 lb (79.8 kg)   BMI 33.25 kg/m        Assessment & Plan:  1. Essential hypertension Do not add salt to diet - amLODipine (NORVASC) 2.5 MG tablet; TAKE 1 TABLET (2.5 MG TOTAL) BY MOUTH DAILY.  Dispense: 90 tablet; Refill: 1  2. Osteoporosis Weight bearing exercises  3. BMI 33.0-33.9,adult Discussed diet and exercise for person with BMI >25 Will recheck weight in 3-6 months  4. Peripheral edema elevate legs when sitting - furosemide (LASIX) 20 MG  tablet; Take 1 tablet (20 mg total) by mouth daily.  Dispense: 30 tablet; Refill: 3    Labs pending Health maintenance reviewed Diet and exercise encouraged Continue all meds Follow up  In 6 month   [place], FNP

## 2016-03-22 LAB — CMP14+EGFR
A/G RATIO: 1.3 (ref 1.2–2.2)
ALBUMIN: 4.1 g/dL (ref 3.5–4.8)
ALK PHOS: 124 IU/L — AB (ref 39–117)
ALT: 15 IU/L (ref 0–32)
AST: 23 IU/L (ref 0–40)
BILIRUBIN TOTAL: 0.6 mg/dL (ref 0.0–1.2)
BUN / CREAT RATIO: 13 (ref 12–28)
BUN: 11 mg/dL (ref 8–27)
CHLORIDE: 102 mmol/L (ref 96–106)
CO2: 24 mmol/L (ref 18–29)
Calcium: 9.2 mg/dL (ref 8.7–10.3)
Creatinine, Ser: 0.83 mg/dL (ref 0.57–1.00)
GFR calc Af Amer: 79 mL/min/{1.73_m2} (ref >59)
GFR calc non Af Amer: 69 mL/min/{1.73_m2} (ref >59)
GLOBULIN, TOTAL: 3.2 g/dL (ref 1.5–4.5)
Glucose: 88 mg/dL (ref 65–99)
POTASSIUM: 4.3 mmol/L (ref 3.5–5.2)
SODIUM: 142 mmol/L (ref 134–144)
Total Protein: 7.3 g/dL (ref 6.0–8.5)

## 2016-03-22 LAB — LIPID PANEL
CHOLESTEROL TOTAL: 152 mg/dL (ref 100–199)
Chol/HDL Ratio: 3.5 ratio units (ref 0.0–4.4)
HDL: 43 mg/dL (ref >39)
LDL CALC: 64 mg/dL (ref 0–99)
Triglycerides: 227 mg/dL — ABNORMAL HIGH (ref 0–149)
VLDL Cholesterol Cal: 45 mg/dL — ABNORMAL HIGH (ref 5–40)

## 2016-03-24 ENCOUNTER — Other Ambulatory Visit: Payer: Self-pay | Admitting: *Deleted

## 2016-03-24 MED ORDER — FENOFIBRATE 145 MG PO TABS: 145.0000 mg | ORAL_TABLET | Freq: Every day | ORAL | 5 refills | Status: DC

## 2016-07-04 ENCOUNTER — Encounter: Payer: Self-pay | Admitting: Pediatrics

## 2016-07-04 ENCOUNTER — Encounter (INDEPENDENT_AMBULATORY_CARE_PROVIDER_SITE_OTHER): Payer: Self-pay

## 2016-07-04 ENCOUNTER — Ambulatory Visit (INDEPENDENT_AMBULATORY_CARE_PROVIDER_SITE_OTHER): Payer: Medicare Other | Admitting: Pediatrics

## 2016-07-04 VITALS — BP 133/80 | HR 96 | Temp 98.1°F | Ht 61.0 in | Wt 176.2 lb

## 2016-07-04 DIAGNOSIS — R52 Pain, unspecified: Secondary | ICD-10-CM

## 2016-07-04 DIAGNOSIS — R079 Chest pain, unspecified: Secondary | ICD-10-CM

## 2016-07-04 LAB — URINALYSIS, COMPLETE
BILIRUBIN UA: NEGATIVE
Glucose, UA: NEGATIVE
KETONES UA: NEGATIVE
Nitrite, UA: NEGATIVE
PROTEIN UA: NEGATIVE
SPEC GRAV UA: 1.015 (ref 1.005–1.030)
Urobilinogen, Ur: 0.2 mg/dL (ref 0.2–1.0)
pH, UA: 7.5 (ref 5.0–7.5)

## 2016-07-04 LAB — CBC WITH DIFFERENTIAL/PLATELET
BASOS ABS: 0 10*3/uL (ref 0.0–0.2)
Basos: 0 %
EOS (ABSOLUTE): 0.1 10*3/uL (ref 0.0–0.4)
Eos: 1 %
HEMOGLOBIN: 14.8 g/dL (ref 11.1–15.9)
Hematocrit: 43.9 % (ref 34.0–46.6)
Immature Grans (Abs): 0 10*3/uL (ref 0.0–0.1)
Immature Granulocytes: 0 %
LYMPHS ABS: 3.2 10*3/uL — AB (ref 0.7–3.1)
Lymphs: 36 %
MCH: 32.9 pg (ref 26.6–33.0)
MCHC: 33.7 g/dL (ref 31.5–35.7)
MCV: 98 fL — ABNORMAL HIGH (ref 79–97)
MONOCYTES: 7 %
MONOS ABS: 0.6 10*3/uL (ref 0.1–0.9)
NEUTROS PCT: 56 %
Neutrophils Absolute: 4.9 10*3/uL (ref 1.4–7.0)
Platelets: 242 10*3/uL (ref 150–379)
RBC: 4.5 x10E6/uL (ref 3.77–5.28)
RDW: 13.5 % (ref 12.3–15.4)
WBC: 8.8 10*3/uL (ref 3.4–10.8)

## 2016-07-04 LAB — MICROSCOPIC EXAMINATION: Renal Epithel, UA: NONE SEEN /hpf

## 2016-07-04 LAB — CMP14+EGFR
A/G RATIO: 1.2 (ref 1.2–2.2)
ALBUMIN: 4 g/dL (ref 3.5–4.8)
ALK PHOS: 109 IU/L (ref 39–117)
ALT: 27 IU/L (ref 0–32)
AST: 38 IU/L (ref 0–40)
BILIRUBIN TOTAL: 0.5 mg/dL (ref 0.0–1.2)
BUN / CREAT RATIO: 9 — AB (ref 12–28)
BUN: 7 mg/dL — ABNORMAL LOW (ref 8–27)
CHLORIDE: 101 mmol/L (ref 96–106)
CO2: 25 mmol/L (ref 18–29)
CREATININE: 0.75 mg/dL (ref 0.57–1.00)
Calcium: 9.8 mg/dL (ref 8.7–10.3)
GFR calc Af Amer: 90 mL/min/{1.73_m2} (ref >59)
GFR calc non Af Amer: 78 mL/min/{1.73_m2} (ref >59)
GLOBULIN, TOTAL: 3.4 g/dL (ref 1.5–4.5)
Glucose: 97 mg/dL (ref 65–99)
POTASSIUM: 4.1 mmol/L (ref 3.5–5.2)
SODIUM: 143 mmol/L (ref 134–144)
Total Protein: 7.4 g/dL (ref 6.0–8.5)

## 2016-07-04 NOTE — Progress Notes (Signed)
  Subjective:   Patient ID: [name], female    DOB: 1941-04-21, 75 y.o.   MRN: [id number] CC: Generalized Body Aches (Intermittent)  HPI: SEYMONE FORLENZA is a 75 y.o. female presenting for Generalized Body Aches (Intermittent)  Arm aching for a few days Had some aching in her chest down both arms for about ten minutes last week, was workin [employer] when it first started Has aching in both arms off and on since then Two nights ago aching all night, couldn't sleep, couldn't get comfortable in bed Random when it starts, sometimes when she is walking, sometimes at rest Sometimes walking helps it get better Cant concentrate on what she is doing when it happens No SOB, no nausea, no jaw pain No chest pain or aching in arms now No fevers, congestion, coughing, URI sx Otherwise feels well  Stopped fenofibrate and lasix, didn't help No h/o stroke/heart attacks  Relevant past medical, surgical, family and social history reviewed. Allergies and medications reviewed and updated. History  Smoking Status  . Former Smoker  Smokeless Tobacco  . Never Used   ROS: Per HPI   Objective:    BP 133/80   Pulse 96   Temp 98.1 F (36.7 C) (Oral)   Ht '5\' 1"'$  (1.549 m)   Wt 176 lb 3.2 oz (79.9 kg)   BMI 33.29 kg/m   Wt Readings from Last 3 Encounters:  07/04/16 176 lb 3.2 oz (79.9 kg)  03/21/16 176 lb (79.8 kg)  12/19/14 177 lb 12.8 oz (80.6 kg)    Gen: NAD, alert, cooperative with exam, NCAT EYES: EOMI, no conjunctival injection, or no icterus ENT:  TMs pearly gray b/l, OP without erythema LYMPH: no cervical LAD CV: NRRR, normal S1/S2, no murmur, distal pulses 2+ b/l Resp: CTABL, no wheezes, normal WOB Ext: No edema, warm Neuro: Alert and oriented, strength equal b/l UE and LE, coordination grossly normal MSK: normal ROM shoulders b/l, no TTP chest wall, shoulders  EKG: NSR, no Twave inversions, qwaves lead II, poor R wave progression  Assessment & Plan:  Kalina was seen today  for generalized body aches.  Diagnoses and all orders for this visit:  Aching pain -     CMP14+EGFR -     CBC with Differential/Platelet -     Urinalysis, Complete -     EKG 12-Lead  Chest pain, unspecified type Atypical chest pain, EKG with poor R wave progression, q waves inf leads No chest pain now Will refer to cardiology for CAD evaluation H/o HTN, SBP 130s today, no DM2, h/o smoking, not a current smoker Discussed return precautions -     Ambulatory referral to Cardiology  Follow up plan: 2 weeks Assunta Found, MD [place]

## 2016-07-19 ENCOUNTER — Other Ambulatory Visit: Payer: Self-pay | Admitting: Nurse Practitioner

## 2016-07-19 DIAGNOSIS — R609 Edema, unspecified: Secondary | ICD-10-CM

## 2016-08-11 NOTE — Progress Notes (Signed)
Cardiology Office Note   Date:  08/12/2016   ID:  Jacqueline Kidd, Jacqueline Kidd Feb 24, 1941, MRN [id number]  PCP:  Chevis Pretty, FNP  Cardiologist:   Jenkins Rouge, MD   No chief complaint on file.     History of Present Illness: Jacqueline Kidd is a 76 y.o. female who presents for evaluation for chest pain.  Seen by [facility] 07/04/16 for generalized Body aches.  Radiated to both arms.  Not related to exertion. Stopped fenofibrate and lasix with no help  CRF;s previous smoking , HTN Concern that ECG was abnormal  Labs ok but no TSH, no CPK, no ESR  Still with myalgias in both arms after stopping fenofibrate and lasix. No other arthritis or systemic connective tissue Disease Started smoking again. Denies palpitations dyspnea Compliant with meds  She has less chest pain and more arms now Pain worse at night in   better when she sits up      Past Medical History:  Diagnosis Date  . Chronic low back pain   . COPD (chronic obstructive pulmonary disease) ([place])   . GERD (gastroesophageal reflux disease)   . Hyperlipidemia   . Hypertension   . Osteoporosis     Past Surgical History:  Procedure Laterality Date  . ABDOMINAL HYSTERECTOMY    . TOTAL HIP ARTHROPLASTY     right     Current Outpatient Prescriptions  Medication Sig Dispense Refill  . amLODipine (NORVASC) 2.5 MG tablet TAKE 1 TABLET (2.5 MG TOTAL) BY MOUTH DAILY. 90 tablet 1  . aspirin EC 81 MG tablet Take 81 mg by mouth daily.    . cholecalciferol (VITAMIN D) 1000 units tablet Take 1,000 Units by mouth daily.    . Omega-3 Fatty Acids (FISH OIL) 1000 MG CAPS Take 1 capsule by mouth daily.     No current facility-administered medications for this visit.     Allergies:   Fosamax [alendronate] and Lisinopril-hydrochlorothiazide    Social History:  The patient  reports that she has quit smoking. She has never used smokeless tobacco. She reports that she does not drink alcohol or use drugs.   Family History:  The  patient's family history includes Cancer in her father and mother.    ROS:  Please see the history of present illness.   Otherwise, review of systems are positive for none.   All other systems are reviewed and negative.    PHYSICAL EXAM: VS:  BP 128/70   Pulse 95   Ht _0  (1.549 m)   Wt 175 lb (79.4 kg)   SpO2 96%   BMI 33.07 kg/m  , BMI Body mass index is 33.07 kg/m. Affect appropriate Healthy:  appears stated age 82: normal Neck supple with no adenopathy JVP normal no bruits no thyromegaly Lungs clear with no wheezing and good diaphragmatic motion Heart:  S1/S2 no murmur, no rub, gallop or click PMI normal Abdomen: benighn, BS positve, no tenderness, no AAA no bruit.  No HSM or HJR Distal pulses intact with no bruits No edema Neuro non-focal Skin warm and dry No muscular weakness    EKG:   SR low voltage Q in lead 3 no old to compare    Recent Labs: 07/04/2016: ALT 27; BUN 7; Creatinine, Ser 0.75; Platelets 242; Potassium 4.1; Sodium 143    Lipid Panel    Component Value Date/Time   CHOL 152 03/21/2016 1518   CHOL 171 01/21/2013 1526   TRIG 227 (H) 03/21/2016 1518  TRIG 212 (H) 12/21/2013 1658   TRIG 230 (H) 01/21/2013 1526   HDL 43 03/21/2016 1518   HDL 39 (L) 12/21/2013 1658   HDL 42 01/21/2013 1526   CHOLHDL 3.5 03/21/2016 1518   LDLCALC 64 03/21/2016 1518   LDLCALC 77 12/21/2013 1658   LDLCALC 83 01/21/2013 1526      Wt Readings from Last 3 Encounters:  08/12/16 175 lb (79.4 kg)  07/04/16 176 lb 3.2 oz (79.9 kg)  03/21/16 176 lb (79.8 kg)      Other studies Reviewed: Additional studies/ records that were reviewed today include: Notes primary ECG and labs .    ASSESSMENT AND PLAN:  1.  Chest Pain atypical abnormal ECG smoker f/u lexiscan myovue  2. Abnormal ECG doubt old MI f/u myovue with EF 3. HTN Well controlled.  Continue current medications and low sodium Dash type diet.   4. Myalgias Check TSH, ESR, CPK and RF, ANA  5. Chol:   LDL 64 Triglycerides 227   Current medicines are reviewed at length with the patient today.  The patient does not have concerns regarding medicines.  The following changes have been made:  no change  Labs/ tests ordered today include: see above   Orders Placed This Encounter  Procedures  . US Carotid Duplex Bilateral  . NM Myocar Multi W/Spect W/Wall Motion / EF  . Sed Rate (ESR)  . TSH  . Antinuclear Antib (ANA)  . CKMB     Disposition:   FU with cards PRN     Signed, Jenkins Rouge, MD  08/12/2016 1:41 PM    [facility] [place], [place], [place]  [address] Phone: [phone]; Fax: [phone]

## 2016-08-12 ENCOUNTER — Ambulatory Visit (INDEPENDENT_AMBULATORY_CARE_PROVIDER_SITE_OTHER): Payer: Medicare Other | Admitting: Cardiovascular Disease

## 2016-08-12 ENCOUNTER — Encounter: Payer: Self-pay | Admitting: Cardiovascular Disease

## 2016-08-12 ENCOUNTER — Other Ambulatory Visit (HOSPITAL_COMMUNITY)
Admission: RE | Admit: 2016-08-12 | Discharge: 2016-08-12 | Disposition: A | Discharge status: Home / Self Care | Payer: Medicare Other | Source: Ambulatory Visit | Attending: Cardiovascular Disease | Admitting: Cardiovascular Disease

## 2016-08-12 VITALS — BP 128/70 | HR 95 | Ht 61.0 in | Wt 175.0 lb

## 2016-08-12 DIAGNOSIS — R002 Palpitations: Secondary | ICD-10-CM | POA: Insufficient documentation

## 2016-08-12 DIAGNOSIS — R079 Chest pain, unspecified: Secondary | ICD-10-CM

## 2016-08-12 DIAGNOSIS — R0989 Other specified symptoms and signs involving the circulatory and respiratory systems: Secondary | ICD-10-CM | POA: Diagnosis not present

## 2016-08-12 LAB — CK TOTAL AND CKMB (NOT AT ARMC)
CK, MB: 1.5 ng/mL (ref 0.5–5.0)
Relative Index: INVALID (ref 0.0–2.5)
Total CK: 61 U/L (ref 38–234)

## 2016-08-12 LAB — SEDIMENTATION RATE: SED RATE: 24 mm/h — AB (ref 0–22)

## 2016-08-12 LAB — TSH: TSH: 1.003 u[IU]/mL (ref 0.350–4.500)

## 2016-08-12 NOTE — Patient Instructions (Signed)
Medication Instructions:  Your physician recommends that you continue on your current medications as directed. Please refer to the Current Medication list given to you today.   Labwork: Your physician recommends that you return for lab work in: TODAY   Testing/Procedures: Your physician has requested that you have a lexiscan myoview. For further information please visit [email or web address]. Please follow instruction sheet, as given.  Your physician has requested that you have a carotid duplex. This test is an ultrasound of the carotid arteries in your neck. It looks at blood flow through these arteries that supply the brain with blood. Allow one hour for this exam. There are no restrictions or special instructions.    Follow-Up: Your physician recommends that you schedule a follow-up appointment in: TO BE DETERMINED WILL CALL WITH TEST RESULTS   Any Other Special Instructions Will Be Listed Below (If Applicable).     If you need a refill on your cardiac medications before your next appointment, please call your pharmacy.

## 2016-08-13 LAB — FANA STAINING PATTERNS: Speckled Pattern: 1:160 {titer} — ABNORMAL HIGH

## 2016-08-13 LAB — ANTINUCLEAR ANTIBODIES, IFA: ANA Ab, IFA: POSITIVE — AB

## 2016-08-21 ENCOUNTER — Encounter (HOSPITAL_BASED_OUTPATIENT_CLINIC_OR_DEPARTMENT_OTHER)
Admission: RE | Admit: 2016-08-21 | Discharge: 2016-08-21 | Disposition: A | Discharge status: Home / Self Care | Payer: Medicare Other | Source: Ambulatory Visit | Attending: Cardiovascular Disease | Admitting: Cardiovascular Disease

## 2016-08-21 ENCOUNTER — Ambulatory Visit (HOSPITAL_COMMUNITY)
Admission: RE | Admit: 2016-08-21 | Discharge: 2016-08-21 | Disposition: A | Discharge status: Home / Self Care | Payer: Medicare Other | Source: Ambulatory Visit | Attending: Cardiovascular Disease | Admitting: Cardiovascular Disease

## 2016-08-21 ENCOUNTER — Encounter (HOSPITAL_COMMUNITY): Payer: Self-pay

## 2016-08-21 ENCOUNTER — Inpatient Hospital Stay (HOSPITAL_COMMUNITY): Admission: RE | Admit: 2016-08-21 | Payer: Medicare Other | Source: Ambulatory Visit

## 2016-08-21 DIAGNOSIS — R0989 Other specified symptoms and signs involving the circulatory and respiratory systems: Secondary | ICD-10-CM | POA: Diagnosis not present

## 2016-08-21 DIAGNOSIS — R079 Chest pain, unspecified: Secondary | ICD-10-CM | POA: Diagnosis not present

## 2016-08-21 DIAGNOSIS — I6529 Occlusion and stenosis of unspecified carotid artery: Secondary | ICD-10-CM | POA: Insufficient documentation

## 2016-08-21 DIAGNOSIS — I6523 Occlusion and stenosis of bilateral carotid arteries: Secondary | ICD-10-CM | POA: Diagnosis not present

## 2016-08-21 LAB — NM MYOCAR MULTI W/SPECT W/WALL MOTION / EF
CHL CUP NUCLEAR SDS: 0
CHL CUP NUCLEAR SSS: 1
LHR: 0.51
LV dias vol: 54 mL (ref 46–106)
LV sys vol: 11 mL
Peak HR: 107 {beats}/min
Rest HR: 81 {beats}/min
SRS: 1
TID: 1.04

## 2016-08-21 MED ORDER — TECHNETIUM TC 99M TETROFOSMIN IV KIT
10.0000 | PACK | Freq: Once | INTRAVENOUS | Status: AC | PRN
  Administered 2016-08-21: 10.5 via INTRAVENOUS

## 2016-08-21 MED ORDER — REGADENOSON 0.4 MG/5ML IV SOLN
INTRAVENOUS | Status: AC
  Administered 2016-08-21: 0.4 mg via INTRAVENOUS
  Filled 2016-08-21: qty 5

## 2016-08-21 MED ORDER — SODIUM CHLORIDE 0.9% FLUSH
INTRAVENOUS | Status: AC
  Administered 2016-08-21: 10 mL via INTRAVENOUS
  Filled 2016-08-21: qty 10

## 2016-08-21 MED ORDER — TECHNETIUM TC 99M TETROFOSMIN IV KIT
30.0000 | PACK | Freq: Once | INTRAVENOUS | Status: AC | PRN
  Administered 2016-08-21: 29.1 via INTRAVENOUS

## 2016-09-02 DIAGNOSIS — H2513 Age-related nuclear cataract, bilateral: Secondary | ICD-10-CM | POA: Diagnosis not present

## 2016-09-02 DIAGNOSIS — H40033 Anatomical narrow angle, bilateral: Secondary | ICD-10-CM | POA: Diagnosis not present

## 2016-09-12 DIAGNOSIS — H2512 Age-related nuclear cataract, left eye: Secondary | ICD-10-CM | POA: Diagnosis not present

## 2016-09-12 DIAGNOSIS — H2511 Age-related nuclear cataract, right eye: Secondary | ICD-10-CM | POA: Diagnosis not present

## 2016-09-15 ENCOUNTER — Telehealth: Payer: Self-pay

## 2016-09-15 ENCOUNTER — Other Ambulatory Visit: Payer: Self-pay | Admitting: Nurse Practitioner

## 2016-09-15 DIAGNOSIS — I10 Essential (primary) hypertension: Secondary | ICD-10-CM

## 2016-09-15 NOTE — Telephone Encounter (Signed)
Patient is scheduled for Cataract surgery tomorrow at Dr. Trena Platt office [facility] ). Patient is inquiring about the fax that was sent giving her permission to have this surgery. Please advise.

## 2016-09-16 DIAGNOSIS — H2512 Age-related nuclear cataract, left eye: Secondary | ICD-10-CM | POA: Diagnosis not present

## 2016-09-16 NOTE — Telephone Encounter (Signed)
She is fine to have cataract surgery

## 2016-09-16 NOTE — Telephone Encounter (Signed)
Will fax note to [facility] at [phone].

## 2016-09-23 DIAGNOSIS — H04123 Dry eye syndrome of bilateral lacrimal glands: Secondary | ICD-10-CM | POA: Diagnosis not present

## 2016-10-09 DIAGNOSIS — H2511 Age-related nuclear cataract, right eye: Secondary | ICD-10-CM | POA: Diagnosis not present

## 2016-10-16 ENCOUNTER — Other Ambulatory Visit: Payer: Self-pay | Admitting: Pediatrics

## 2016-10-16 DIAGNOSIS — I10 Essential (primary) hypertension: Secondary | ICD-10-CM

## 2016-12-14 ENCOUNTER — Other Ambulatory Visit: Payer: Self-pay | Admitting: Pediatrics

## 2016-12-14 DIAGNOSIS — I10 Essential (primary) hypertension: Secondary | ICD-10-CM

## 2017-01-12 ENCOUNTER — Other Ambulatory Visit: Payer: Self-pay | Admitting: Pediatrics

## 2017-01-12 DIAGNOSIS — I10 Essential (primary) hypertension: Secondary | ICD-10-CM

## 2017-01-12 NOTE — Telephone Encounter (Signed)
Approving med, needs appt to be seen for further refills

## 2017-01-12 NOTE — Telephone Encounter (Signed)
Last seen 07/04/16  Dr Evette Doffing

## 2017-01-13 ENCOUNTER — Other Ambulatory Visit: Payer: Self-pay | Admitting: Pediatrics

## 2017-01-13 DIAGNOSIS — I10 Essential (primary) hypertension: Secondary | ICD-10-CM

## 2017-01-13 NOTE — Telephone Encounter (Signed)
Pt aware rx is approved and she scheduled a f/u appt with Dr Evette Doffing 7/26 at 2:10.

## 2017-01-14 NOTE — Telephone Encounter (Signed)
Last seen 07/04/17  Dr Evette Doffing

## 2017-01-29 ENCOUNTER — Ambulatory Visit (INDEPENDENT_AMBULATORY_CARE_PROVIDER_SITE_OTHER): Payer: Medicare Other | Admitting: Pediatrics

## 2017-01-29 ENCOUNTER — Encounter: Payer: Self-pay | Admitting: Pediatrics

## 2017-01-29 ENCOUNTER — Encounter (INDEPENDENT_AMBULATORY_CARE_PROVIDER_SITE_OTHER): Payer: Self-pay

## 2017-01-29 VITALS — BP 134/75 | HR 93 | Temp 97.7°F | Ht 61.0 in | Wt 173.2 lb

## 2017-01-29 DIAGNOSIS — R490 Dysphonia: Secondary | ICD-10-CM | POA: Diagnosis not present

## 2017-01-29 DIAGNOSIS — Z720 Tobacco use: Secondary | ICD-10-CM

## 2017-01-29 DIAGNOSIS — I10 Essential (primary) hypertension: Secondary | ICD-10-CM

## 2017-01-29 DIAGNOSIS — E785 Hyperlipidemia, unspecified: Secondary | ICD-10-CM

## 2017-01-29 MED ORDER — AMLODIPINE BESYLATE 2.5 MG PO TABS: 2.5000 mg | ORAL_TABLET | Freq: Every day | ORAL | 1 refills | Status: DC

## 2017-01-29 MED ORDER — CETIRIZINE HCL 10 MG PO TABS: 10.0000 mg | ORAL_TABLET | Freq: Every day | ORAL | 11 refills | Status: DC

## 2017-01-29 NOTE — Progress Notes (Signed)
  Subjective:   Patient ID: Jacqueline Kidd, female    DOB: June 25, 1941, 76 y.o.   MRN: [id number] CC: Follow-up and Hoarse  HPI: Jacqueline Kidd is a 76 y.o. female presenting for Follow-up and Hoarse  About a week ago started feeling more hoarse If she talks loud she loses her voice No ear pain, sore throat, runny nose  Had recent negative stress test Carotid u/s with plaque no stenosis   Recently spread pine mulch bark   Smoking 3-4 cig a day Says she could quit if she wants to, she doesn't want to  Doesn't want to be on cholesterol medicine  No Cp, no SOB BPs at home sometimes 110s/50s  Relevant past medical, surgical, family and social history reviewed. Allergies and medications reviewed and updated. History  Smoking Status  . Former Smoker  Smokeless Tobacco  . Never Used   ROS: Per HPI   Objective:    BP 134/75   Pulse 93   Temp 97.7 F (36.5 C) (Oral)   Ht 5\' 1"  (1.549 m)   Wt 173 lb 3.2 oz (78.6 kg)   BMI 32.73 kg/m   Wt Readings from Last 3 Encounters:  01/29/17 173 lb 3.2 oz (78.6 kg)  08/12/16 175 lb (79.4 kg)  07/04/16 176 lb 3.2 oz (79.9 kg)    Gen: NAD, alert, cooperative with exam, NCAT EYES: EOMI, no conjunctival injection, or no icterus ENT:  TMs slightly injected b/l, OP without erythema LYMPH: no cervical LAD CV: NRRR, normal S1/S2, no murmur, distal pulses 2+ b/l Resp: CTABL, no wheezes, normal WOB Ext: No edema, warm Neuro: Alert and oriented  Assessment & Plan:  Jacqueline Kidd was seen today for follow-up and hoarse.  Diagnoses and all orders for this visit:  Hoarseness Trial of below, if not improving will send to ENT -     cetirizine (ZYRTEC) 10 MG tablet; Take 1 tablet (10 mg total) by mouth daily.  Essential hypertension Adequate control today, improved at home -     amLODipine (NORVASC) 2.5 MG tablet; Take 1 tablet (2.5 mg total) by mouth daily.  Tobacco use Not interested in cessation  HLD Carotid plaque, no stenosis ASCVD risk  33% based on 2017cholesterol numbers Pt not interested in cholesterol medication at this time  Follow up plan: Return in about 6 months (around 08/01/2017) for blood pressure. Assunta Found, MD [place]

## 2017-02-04 ENCOUNTER — Other Ambulatory Visit: Payer: Self-pay | Admitting: *Deleted

## 2017-02-04 ENCOUNTER — Telehealth: Payer: Self-pay | Admitting: Pediatrics

## 2017-02-04 DIAGNOSIS — R490 Dysphonia: Secondary | ICD-10-CM

## 2017-02-04 NOTE — Telephone Encounter (Signed)
Patient seen Dr. Evette Doffing 7/26 for hoarseness and sob. Patient states that she is no better and they zyrtec is not helping. Please advise

## 2017-02-04 NOTE — Telephone Encounter (Signed)
Referral placed to ENT patient aware

## 2017-02-09 DIAGNOSIS — R490 Dysphonia: Secondary | ICD-10-CM | POA: Diagnosis not present

## 2017-02-11 ENCOUNTER — Other Ambulatory Visit: Payer: Self-pay | Admitting: Otolaryngology

## 2017-02-11 DIAGNOSIS — R490 Dysphonia: Secondary | ICD-10-CM

## 2017-02-13 ENCOUNTER — Ambulatory Visit
Admission: RE | Admit: 2017-02-13 | Discharge: 2017-02-13 | Disposition: A | Discharge status: Home / Self Care | Payer: Medicare Other | Source: Ambulatory Visit | Attending: Otolaryngology | Admitting: Otolaryngology

## 2017-02-13 DIAGNOSIS — R490 Dysphonia: Secondary | ICD-10-CM | POA: Diagnosis not present

## 2017-02-13 MED ORDER — IOPAMIDOL (ISOVUE-300) INJECTION 61%
75.0000 mL | Freq: Once | INTRAVENOUS | Status: AC | PRN
  Administered 2017-02-13: 75 mL via INTRAVENOUS

## 2017-02-16 ENCOUNTER — Ambulatory Visit (INDEPENDENT_AMBULATORY_CARE_PROVIDER_SITE_OTHER): Payer: Medicare Other | Admitting: Pediatrics

## 2017-02-16 ENCOUNTER — Telehealth: Payer: Self-pay | Admitting: *Deleted

## 2017-02-16 ENCOUNTER — Encounter: Payer: Self-pay | Admitting: Pediatrics

## 2017-02-16 VITALS — BP 136/82 | HR 93 | Temp 97.9°F | Ht 61.0 in | Wt 171.4 lb

## 2017-02-16 DIAGNOSIS — R918 Other nonspecific abnormal finding of lung field: Secondary | ICD-10-CM

## 2017-02-16 NOTE — Telephone Encounter (Signed)
Appt made to see Dr. Evette Doffing today at 11:45 to discuss CT scan

## 2017-02-16 NOTE — Progress Notes (Signed)
Here today with husband, Fritz Pickerel to discuss recent CT results. Pt was referred to ENT for new hoarseness three weeks ago. Seen by Dr Lucia Gaskins, had paralysis of L vocal cord. CT scan of neck was done which showed 4.7x5.2x5.5 cm mass in L upper lobe involving the chest wall, concerning for cancer.   Discussed results with pt and husband. Ordered Chest CT scan, will refer to Dr Earlie Server.  If she has not heard about appt date and time within the week, she knows to contact our office.  Follow up with me 3 weeks.

## 2017-02-17 ENCOUNTER — Telehealth: Payer: Self-pay | Admitting: *Deleted

## 2017-02-17 DIAGNOSIS — R918 Other nonspecific abnormal finding of lung field: Secondary | ICD-10-CM

## 2017-02-17 NOTE — Telephone Encounter (Signed)
Oncology Nurse Navigator Documentation  Oncology Nurse Navigator Flowsheets 02/17/2017  Navigator Location [facility]  Referral date to RadOnc/MedOnc 02/17/2017  Navigator Encounter Type Telephone/I received referral today on Ms. Manasco.  I called and scheduled her to be seen with Dr. Julien Nordmann on 02/19/17.  She verbalized understanding of appt time and place.  Telephone Outgoing Call  Treatment Phase Abnormal Scans  Barriers/Navigation Needs Coordination of Care;Education  Education Other  Interventions Education;Coordination of Care  Coordination of Care Appts  Education Method Verbal  Acuity Level 2  Acuity Level 2 Educational needs;Assistance expediting appointments  Time Spent with Patient 30

## 2017-02-19 ENCOUNTER — Ambulatory Visit: Payer: Medicare Other | Attending: Internal Medicine | Admitting: Physical Therapy

## 2017-02-19 ENCOUNTER — Ambulatory Visit (HOSPITAL_BASED_OUTPATIENT_CLINIC_OR_DEPARTMENT_OTHER): Payer: Medicare Other | Admitting: Internal Medicine

## 2017-02-19 ENCOUNTER — Encounter: Payer: Self-pay | Admitting: Internal Medicine

## 2017-02-19 ENCOUNTER — Encounter: Payer: Self-pay | Admitting: *Deleted

## 2017-02-19 ENCOUNTER — Telehealth: Payer: Self-pay | Admitting: Internal Medicine

## 2017-02-19 ENCOUNTER — Other Ambulatory Visit (HOSPITAL_BASED_OUTPATIENT_CLINIC_OR_DEPARTMENT_OTHER): Payer: Medicare Other

## 2017-02-19 VITALS — BP 133/65 | HR 87 | Temp 98.6°F | Resp 18 | Ht 61.5 in | Wt 170.4 lb

## 2017-02-19 DIAGNOSIS — Z87891 Personal history of nicotine dependence: Secondary | ICD-10-CM | POA: Diagnosis not present

## 2017-02-19 DIAGNOSIS — Z803 Family history of malignant neoplasm of breast: Secondary | ICD-10-CM

## 2017-02-19 DIAGNOSIS — R29898 Other symptoms and signs involving the musculoskeletal system: Secondary | ICD-10-CM | POA: Diagnosis not present

## 2017-02-19 DIAGNOSIS — R599 Enlarged lymph nodes, unspecified: Secondary | ICD-10-CM | POA: Diagnosis not present

## 2017-02-19 DIAGNOSIS — R918 Other nonspecific abnormal finding of lung field: Secondary | ICD-10-CM

## 2017-02-19 DIAGNOSIS — R490 Dysphonia: Secondary | ICD-10-CM

## 2017-02-19 DIAGNOSIS — I10 Essential (primary) hypertension: Secondary | ICD-10-CM

## 2017-02-19 DIAGNOSIS — J449 Chronic obstructive pulmonary disease, unspecified: Secondary | ICD-10-CM | POA: Diagnosis not present

## 2017-02-19 DIAGNOSIS — Z808 Family history of malignant neoplasm of other organs or systems: Secondary | ICD-10-CM

## 2017-02-19 DIAGNOSIS — C3412 Malignant neoplasm of upper lobe, left bronchus or lung: Secondary | ICD-10-CM | POA: Insufficient documentation

## 2017-02-19 DIAGNOSIS — R293 Abnormal posture: Secondary | ICD-10-CM | POA: Diagnosis not present

## 2017-02-19 DIAGNOSIS — M810 Age-related osteoporosis without current pathological fracture: Secondary | ICD-10-CM | POA: Diagnosis not present

## 2017-02-19 DIAGNOSIS — D491 Neoplasm of unspecified behavior of respiratory system: Secondary | ICD-10-CM | POA: Diagnosis not present

## 2017-02-19 LAB — CBC WITH DIFFERENTIAL/PLATELET
BASO%: 0.7 % (ref 0.0–2.0)
BASOS ABS: 0.1 10*3/uL (ref 0.0–0.1)
EOS ABS: 0.1 10*3/uL (ref 0.0–0.5)
EOS%: 1 % (ref 0.0–7.0)
HEMATOCRIT: 42.4 % (ref 34.8–46.6)
HEMOGLOBIN: 14.2 g/dL (ref 11.6–15.9)
LYMPH%: 40 % (ref 14.0–49.7)
MCH: 31.8 pg (ref 25.1–34.0)
MCHC: 33.6 g/dL (ref 31.5–36.0)
MCV: 94.7 fL (ref 79.5–101.0)
MONO#: 0.6 10*3/uL (ref 0.1–0.9)
MONO%: 7.7 % (ref 0.0–14.0)
NEUT#: 4.2 10*3/uL (ref 1.5–6.5)
NEUT%: 50.6 % (ref 38.4–76.8)
Platelets: 239 10*3/uL (ref 145–400)
RBC: 4.47 10*6/uL (ref 3.70–5.45)
RDW: 14.1 % (ref 11.2–14.5)
WBC: 8.3 10*3/uL (ref 3.9–10.3)
lymph#: 3.3 10*3/uL (ref 0.9–3.3)

## 2017-02-19 LAB — COMPREHENSIVE METABOLIC PANEL
ALBUMIN: 3.6 g/dL (ref 3.5–5.0)
ALK PHOS: 110 U/L (ref 40–150)
ALT: 34 U/L (ref 0–55)
AST: 47 U/L — ABNORMAL HIGH (ref 5–34)
Anion Gap: 9 mEq/L (ref 3–11)
BUN: 8.3 mg/dL (ref 7.0–26.0)
CALCIUM: 9.7 mg/dL (ref 8.4–10.4)
CO2: 26 mEq/L (ref 22–29)
CREATININE: 0.8 mg/dL (ref 0.6–1.1)
Chloride: 105 mEq/L (ref 98–109)
EGFR: 72 mL/min/{1.73_m2} — AB (ref >90)
Glucose: 90 mg/dl (ref 70–140)
POTASSIUM: 4.1 meq/L (ref 3.5–5.1)
Sodium: 140 mEq/L (ref 136–145)
Total Bilirubin: 0.69 mg/dL (ref 0.20–1.20)
Total Protein: 8.1 g/dL (ref 6.4–8.3)

## 2017-02-19 NOTE — Telephone Encounter (Signed)
Gave patient avs and calendars for her upcoming appts in Aug.

## 2017-02-19 NOTE — Progress Notes (Signed)
[facility] Clinical Social Work  Clinical Social Work met with patient/family and Futures trader at [facility] appointment to offer support and assess for psychosocial needs.  Medical oncologist reviewed patient's CT scan of the neck and recommended additional testing to determine diagnosis/treatment plan with patient/family.  Jacqueline Kidd was accompanied by her spouse of 72 years.  She lives in [place].  She shared she feels somewhat overwhelmed, but feels "it's in Gods hands".   The patient is eager to complete testing to determine a plan.  ONCBCN DISTRESS SCREENING 02/19/2017  Screening Type Initial Screening  Distress experienced in past week (1-10) 2  Information Concerns Type Lack of info about diagnosis;Lack of info about treatment;Lack of info about complementary therapy choices      Clinical Social Work briefly discussed Clinical Social Work role and [employer] support programs/services.  Clinical Social Work encouraged patient to call with any additional questions or concerns.   Jacqueline Kidd, MSW, LCSW, OSW-C Clinical Social Worker [facility] [phone]

## 2017-02-19 NOTE — Progress Notes (Signed)
[place] [phone]   [phone] Multidisciplinary thoracic oncology clinic  CONSULT NOTE  REFERRING PHYSICIAN: Dr. Melony Overly  REASON FOR CONSULTATION:  76 years old white female with highly suspicious lung cancer.  HPI DELBRA ZELLARS is a 76 y.o. female with past medical history significant for COPD, GERD, hypertension, dyslipidemia, osteoporosis, right hip replacement as well as long history of smoking. In months ago the patient workup in the morning and had hoarseness of her voice. She was seen by her primary care physician at that time and was treated for allergy with no improvement. She weeks later she presented to Dr. Lucia Gaskins for evaluation. She had laryngoscopy that showed paralysis of the left vocal cord. Dr. Lucia Gaskins ordered CT scan of the neck which was performed on 02/13/2017 and it showed left upper lobe lung mass measuring 5.5 x 5.2 x 4.7 cm, highly concerning for a primary bronchogenic neoplasm. There was also left hilar and mediastinal adenopathy with soft tissue subjacent to the aortic arch and likely impacting the left recurrent laryngeal nerve. There was also U displaced left true vocal cord and irregularity of the left. Cord likely related to vocal cord paralysis is associated with the mediastinal tumor. The patient is scheduled to have CT scan of the chest on 02/23/2017. She was referred to me today for evaluation and recommendation regarding her condition. When seen today the patient continues to have hoarseness of her voice and making it hard to cough. She also has shortness breath with exertion but no significant chest pain or hemoptysis. She lost around 5 pounds in the last few months. She denied having any nausea, vomiting, diarrhea or constipation. She denied having any headache or visual changes. Family history significant for a mother with breast cancer, father had head and neck cancer and sister had breast cancer. The  patient is married and has 2 children. She used to work as Insurance claims handler she has a history of smoking less than one pack per day for around 60 years and she quit 2 days ago. She has no history of alcohol or drug abuse.  HPI  Past Medical History:  Diagnosis Date  . Chronic low back pain   . COPD (chronic obstructive pulmonary disease) ([place])   . GERD (gastroesophageal reflux disease)   . Hyperlipidemia   . Hypertension   . Osteoporosis     Past Surgical History:  Procedure Laterality Date  . ABDOMINAL HYSTERECTOMY    . TOTAL HIP ARTHROPLASTY     right    Family History  Problem Relation Age of Onset  . Cancer Mother   . Cancer Father     Social History Social History  Substance Use Topics  . Smoking status: Former Smoker    Packs/day: 1.00    Years: 57.00    Quit date: 02/16/2017  . Smokeless tobacco: Never Used  . Alcohol use No    Allergies  Allergen Reactions  . Fosamax [Alendronate]   . Lisinopril-Hydrochlorothiazide     Current Outpatient Prescriptions  Medication Sig Dispense Refill  . amLODipine (NORVASC) 2.5 MG tablet Take 1 tablet (2.5 mg total) by mouth daily. 90 tablet 1  . aspirin EC 81 MG tablet Take 81 mg by mouth daily.    . cetirizine (ZYRTEC) 10 MG tablet Take 1 tablet (10 mg total) by mouth daily. 30 tablet 11  . cholecalciferol (VITAMIN D) 1000 units tablet Take 1,000 Units by mouth daily.    . Omega-3 Fatty Acids (FISH  OIL) 1000 MG CAPS Take 1 capsule by mouth daily.     No current facility-administered medications for this visit.     Review of Systems  Constitutional: negative Eyes: negative Ears, nose, mouth, throat, and face: positive for hoarseness Respiratory: positive for cough and dyspnea on exertion Cardiovascular: negative Gastrointestinal: negative Genitourinary:negative Integument/breast: negative Hematologic/lymphatic: negative Musculoskeletal:negative Neurological: negative Behavioral/Psych: negative Endocrine:  negative Allergic/Immunologic: negative  Physical Exam  BLT:JQZES, healthy, no distress, well nourished, well developed and anxious SKIN: skin color, texture, turgor are normal, no rashes or significant lesions HEAD: Normocephalic, No masses, lesions, tenderness or abnormalities EYES: normal, PERRLA, Conjunctiva are pink and non-injected EARS: External ears normal, Canals clear OROPHARYNX:no exudate, no erythema and lips, buccal mucosa, and tongue normal  NECK: supple, no adenopathy, no JVD LYMPH:  no palpable lymphadenopathy, no hepatosplenomegaly BREAST:not examined LUNGS: clear to auscultation , and palpation HEART: regular rate & rhythm, no murmurs and no gallops ABDOMEN:abdomen soft, non-tender, normal bowel sounds and no masses or organomegaly BACK: Back symmetric, no curvature., No CVA tenderness, Range of motion is normal EXTREMITIES:no joint deformities, effusion, or inflammation, no edema, no skin discoloration  NEURO: alert & oriented x 3 with fluent speech, no focal motor/sensory deficits  PERFORMANCE STATUS: ECOG 1  LABORATORY DATA: Lab Results  Component Value Date   WBC 8.3 02/19/2017   HGB 14.2 02/19/2017   HCT 42.4 02/19/2017   MCV 94.7 02/19/2017   PLT 239 02/19/2017      Chemistry      Component Value Date/Time   NA 140 02/19/2017 1314   K 4.1 02/19/2017 1314   CL 101 07/04/2016 1211   CO2 26 02/19/2017 1314   BUN 8.3 02/19/2017 1314   CREATININE 0.8 02/19/2017 1314      Component Value Date/Time   CALCIUM 9.7 02/19/2017 1314   ALKPHOS 110 02/19/2017 1314   AST 47 (H) 02/19/2017 1314   ALT 34 02/19/2017 1314   BILITOT 0.69 02/19/2017 1314       RADIOGRAPHIC STUDIES: Ct Soft Tissue Neck W Contrast  Result Date: 02/13/2017 CLINICAL DATA:  Dysphonia for 3 weeks.  Smoking history. Creatinine was obtained on site at [facility] at [address]Results: Creatinine 0.7 mg/dL. EXAM: CT NECK WITH CONTRAST TECHNIQUE: Multidetector CT  imaging of the neck was performed using the standard protocol following the bolus administration of intravenous contrast. CONTRAST:  38mL ISOVUE-300 IOPAMIDOL (ISOVUE-300) INJECTION 61% COMPARISON:  None. FINDINGS: Pharynx and larynx: Asymmetric soft tissue and irregularity is present at the posterior larynx and false cord on the left. The left vocal cord is deviated laterally. There is mild diffuse prominence of soft tissue involving the palatine tonsils and lingual tonsils. A discrete mass lesion is not identified. The nasopharynx is unremarkable. No other focal mucosal lesions are present. Salivary glands: The submandibular glands are normal bilaterally. 2 posterior inferior left parotid lesions are noted. The larger measures 10 x 15 mm on axial images. Both lesions are approximately 8 mm on coronal imaging. A third lesion measures 5 mm maximally. The submandibular glands are within normal limits bilaterally. Thyroid: Negative Lymph nodes: Subcentimeter posterior left level 3 lymph nodes are present. There is no significant cervical adenopathy. Vascular: Atherosclerotic calcifications are present at the aortic arch. An aberrant left subclavian artery is noted. Limited intracranial: Within normal limits. Visualized orbits: Bilateral lens replacements are present. The visualize globes and orbits are normal. Mastoids and visualized paranasal sinuses: The visualized paranasal sinuses and mastoid air cells are clear. Skeleton:  Multilevel uncovertebral spurring is present. Vertebral body heights alignment are maintained. No focal lytic or blastic lesions are present. Osseous foraminal narrowing is greatest on the right at C5-6 and C6-7 and on the left at C3-4. Upper chest: An irregular left upper lobe mass measures 5.2 x 4.7 x 5.5 cm. Left prevascular lymph nodes measure up to 6 mm in short axis. Diffuse lymphoid tissue is present at the left hilum, the AP window, and extending into the subcarinal space. A pretracheal  lymph node measures 14 mm. Tumor involves the chest wall. No definite osseous destruction is evident. IMPRESSION: 1. Left upper lobe lung mass measures 5.5 x 5.2 x 4.7 cm, highly concerning for a primary bronchogenic neoplasm. Recommend CT of the chest with contrast for further evaluation and staging. Tumor does involve the chest wall. 2. Left hilar and mediastinal adenopathy with soft tissue subjacent to the aortic arch, likely impacting the left recurrent laryngeal nerve. 3. Laterally displaced left true vocal cord and irregularity of the left false cord likely related to vocal cord paralysis associated with the mediastinal tumor. 4. Moderate prominence of lymphoid tissue involving the palatine and lingual tonsils. Recommend direct visualization. Electronically Signed   By: San Morelle M.D.   On: 02/13/2017 15:59    ASSESSMENT: This is a very pleasant 76 years old white female was highly suspicious at least stage III a lung cancer pending further staging workup and tissue diagnosis. The patient presented with large left upper lobe lung mass in addition to left hilar and mediastinal lymphadenopathy and left true vocal cord paralysis.   PLAN: I had a lengthy discussion with the patient and her husband today about her current condition and further investigation to confirm her diagnosis as well as the staging workup. I recommended for the patient to complete the staging workup by ordering a PET scan as well as MRI of the brain. I will also arrange for the patient to have CT-guided core biopsy of the left upper lobe lung mass by interventional radiology for confirmation of her diagnosis. I will also refer the patient to radiation oncology for evaluation and consideration of radiotherapy. The patient will come back for follow-up visit in 2 weeks for reevaluation and more detailed discussion of her treatment options based on the final pathology as well as staging workup. For smoking cessation, I  strongly encouraged the patient to quit smoking and offered her smoke cessation program. The patient was seen during the multidisciplinary thoracic oncology clinic by medical oncology, thoracic navigator, social worker and physical therapist. The patient was advised to call immediately if she has any concerning symptoms in the interval. The patient voices understanding of current disease status and treatment options and is in agreement with the current care plan.  All questions were answered. The patient knows to call the clinic with any problems, questions or concerns. We can certainly see the patient much sooner if necessary.  Thank you so much for allowing me to participate in the care of Ieesha J Tomas. I will continue to follow up the patient with you and assist in her care.  I spent 40 minutes counseling the patient face to face. The total time spent in the appointment was 60 minutes.  Disclaimer: This note was dictated with voice recognition software. Similar sounding words can inadvertently be transcribed and may not be corrected upon review.   Eilleen Kempf February 19, 2017, 2:17 PM

## 2017-02-19 NOTE — Therapy (Signed)
[place], [place], [address] Phone: [phone]   Fax:  [phone]  Physical Therapy Evaluation  Patient Details  Name: Jacqueline Kidd MRN: [id number] Date of Birth: Apr 02, 1941 Referring Provider: Dr. Curt Bears  Encounter Date: 02/19/2017      PT End of Session - 02/19/17 1449    Visit Number 1   Number of Visits 1   PT Start Time 1408   PT Stop Time 1430   PT Time Calculation (min) 22 min   Activity Tolerance Patient tolerated treatment well   Behavior During Therapy [facility] for tasks assessed/performed      Past Medical History:  Diagnosis Date  . Chronic low back pain   . COPD (chronic obstructive pulmonary disease) ([place])   . GERD (gastroesophageal reflux disease)   . Hyperlipidemia   . Hypertension   . Osteoporosis     Past Surgical History:  Procedure Laterality Date  . ABDOMINAL HYSTERECTOMY    . TOTAL HIP ARTHROPLASTY     right    There were no vitals filed for this visit.       Subjective Assessment - 02/19/17 1438    Subjective "We don't even know what this is yet." "I feel like my airway is restricted."   Patient is accompained by: Family member  husband   Pertinent History Pt. had an abnormal lung scan when being worked up after she developed hoarseness.  She needs further workup, but may have stage III lung cancer. h/o right THA 2012; COPD; chronic LBP, osteoporosis; HTN.   Patient Stated Goals get info from all lung clinic providers   Currently in Pain? No/denies            [facility] PT Assessment - 02/19/17 0001      Assessment   Medical Diagnosis lung nodule found on scan; not yet with tissue diagnosis   Referring Provider Dr. Curt Bears   Onset Date/Surgical Date 01/18/17  approx.; noticed hoarse voice one morning   Prior Therapy none     Precautions   Precautions None     Restrictions   Weight Bearing Restrictions No     Balance Screen   Has  the patient fallen in the past 6 months No   Has the patient had a decrease in activity level because of a fear of falling?  No   Is the patient reluctant to leave their home because of a fear of falling?  No     Home Ecologist residence   Living Arrangements Spouse/significant other   Type of [place] to enter   Entrance Stairs-Number of Steps 2   [place] One level     Prior Function   Level of Independence Independent   Vocation Unemployed   Vocation Requirements stays active with all types of housework, including pressure washing   Leisure no regular exercise other than household chores     Cognition   Overall Cognitive Status Within Functional Limits for tasks assessed     Observation/Other Assessments   Observations woman who looks a little younger than her age and appears anxious today     Programmer, applications Movements are Fluid and Coordinated Yes     Functional Tests   Functional tests Sit to Stand     Sit to Stand   Comments 11 times in 30 seconds, about average for age  mild dyspnea following  Posture/Postural Control   Posture/Postural Control Postural limitations   Postural Limitations Increased thoracic kyphosis;Rounded Shoulders     ROM / Strength   AROM / PROM / Strength AROM     AROM   Overall AROM Comments standing trunk AROM WFL in all directions, but pt. reported getting a "catch" in her side with left rotation and a cramp in her side with right rotation     Ambulation/Gait   Ambulation/Gait Yes   Ambulation/Gait Assistance 7: Independent     Balance   Balance Assessed Yes     Dynamic Standing Balance   Dynamic Standing - Comments reaches forward 12 inches in standing, above average for age            Objective measurements completed on examination: See above findings.                  PT Education - 02/19/17 1448    Education provided Yes   Education  Details energy conservation, posture, walking program, CURE article on staying active, info on reasons to exercise, cough splinting, PT info, breathing   Person(s) Educated Patient;Spouse   Methods Explanation;Handout   Comprehension Verbalized understanding               Lung Clinic Goals - 02/19/17 1455      Patient will be able to verbalize understanding of the benefit of exercise to decrease fatigue.   Status Achieved     Patient will be able to verbalize the importance of posture.   Status Achieved     Patient will be able to demonstrate diaphragmatic breathing for improved lung function.   Status Achieved     Patient will be able to verbalize understanding of the role of physical therapy to prevent functional decline and who to contact if physical therapy is needed.   Status Achieved              Plan - 02/19/17 1449    Clinical Impression Statement This is a pleasant woman who appears anxious today, and is at the cancer center with abnormal scan results indicating possible lung cancer.  She does not yet have tissue diagnosis.  She did have mild dyspnea both with 30 second sit to stand and with active trunk ROM in standing; posture is slightly abnormal.    History and Personal Factors relevant to plan of care: COPD, h/o right THA, chronic LBP, osteoporosis   Clinical Presentation Evolving   Clinical Presentation due to: still being worked up for possible lung cancer, stage III   Clinical Decision Making Moderate   Rehab Potential Good   PT Frequency One time visit   PT Treatment/Interventions Patient/family education   PT Next Visit Plan None at this time; patient may need therapy going forward if she undergoes cancer treatment.   Consulted and Agree with Plan of Care Patient      Patient will benefit from skilled therapeutic intervention in order to improve the following deficits and impairments:  Cardiopulmonary status limiting activity, Postural  dysfunction  Visit Diagnosis: Abnormal posture - Plan: PT plan of care cert/re-cert  Neoplasm of lung - Plan: PT plan of care cert/re-cert  Other symptoms and signs involving the musculoskeletal system - Plan: PT plan of care cert/re-cert      G-Codes - 63/87/56 1456    Functional Assessment Tool Used (Outpatient Only) clinical judgement   Functional Limitation Mobility: Walking and moving around  related to dyspnea after mild activity   Mobility: Walking  and Moving Around Current Status [phone]) At least 1 percent but less than 20 percent impaired, limited or restricted   Mobility: Walking and Moving Around Goal Status [phone]) At least 1 percent but less than 20 percent impaired, limited or restricted   Mobility: Walking and Moving Around Discharge Status [phone]) At least 1 percent but less than 20 percent impaired, limited or restricted       Problem List Patient Active Problem List   Diagnosis Date Noted  . Mass of upper lobe of left lung 02/19/2017  . Lung mass 02/16/2017  . BMI 33.0-33.9,adult 03/21/2016  . Peripheral edema 03/21/2016  . Osteoporosis 06/21/2014  . Hypertension 01/21/2013    Crucita Lacorte 02/19/2017, 2:58 PM  [place], [place], [address] Phone: [phone]   Fax:  [phone]  Name: VIRDIA ZIESMER MRN: [id number] Date of Birth: September 20, 1940  Serafina Royals, PT 02/19/17 2:58 PM

## 2017-02-20 ENCOUNTER — Telehealth: Payer: Self-pay | Admitting: *Deleted

## 2017-02-20 ENCOUNTER — Encounter: Payer: Self-pay | Admitting: Radiation Oncology

## 2017-02-20 DIAGNOSIS — R918 Other nonspecific abnormal finding of lung field: Secondary | ICD-10-CM

## 2017-02-20 NOTE — Telephone Encounter (Signed)
"  Zigmund Daniel with [facility] calling to notify Dr. Julien Nordmann to order MRI brain with and without contrast.  This is protocol since she does have a mass.  Needs orders before the scheduled appointment on 03-05-2017."

## 2017-02-23 ENCOUNTER — Ambulatory Visit (HOSPITAL_COMMUNITY)
Admission: RE | Admit: 2017-02-23 | Discharge: 2017-02-23 | Disposition: A | Discharge status: Home / Self Care | Payer: Medicare Other | Source: Ambulatory Visit | Attending: Pediatrics | Admitting: Pediatrics

## 2017-02-23 DIAGNOSIS — J449 Chronic obstructive pulmonary disease, unspecified: Secondary | ICD-10-CM | POA: Diagnosis not present

## 2017-02-23 DIAGNOSIS — C781 Secondary malignant neoplasm of mediastinum: Secondary | ICD-10-CM | POA: Diagnosis not present

## 2017-02-23 DIAGNOSIS — R590 Localized enlarged lymph nodes: Secondary | ICD-10-CM | POA: Insufficient documentation

## 2017-02-23 DIAGNOSIS — R918 Other nonspecific abnormal finding of lung field: Secondary | ICD-10-CM | POA: Insufficient documentation

## 2017-02-23 DIAGNOSIS — C801 Malignant (primary) neoplasm, unspecified: Secondary | ICD-10-CM | POA: Insufficient documentation

## 2017-02-23 MED ORDER — IOPAMIDOL (ISOVUE-300) INJECTION 61%
75.0000 mL | Freq: Once | INTRAVENOUS | Status: AC | PRN
  Administered 2017-02-23: 75 mL via INTRAVENOUS

## 2017-02-26 NOTE — Progress Notes (Signed)
Thoracic Location of Tumor / Histology: Suspicious bronchogenic neoplasm of the left upper lobe lung and left hilar and mediastinal adenopathy  Patient report that at Christmas she felt an aching in her chest that radiated down her arm. She reports her stress test came back normal. Then, July 26 she presented to her PCP, Jacqueline Kidd with hoarseness. She reports Jacqueline Kidd put her on Zyrtec. Patient reports after a week on Zyrtec without relief he PCP referred her to [facility].Patient presented to Dr. Lucia Gaskins with paralysis of Left vocal cord which was Kidd to be the result of a left lung mass.  Patient was seen by Dr. Julien Nordmann in the Multidisciplinary Thoracic Oncology clinic on 02/19/2017.  Guided CT biopsy ordered but not scheduled yet, PET Scan scheduled for 06-06-2017, MRI Brain scheduled for 03/05/2017.  Biopsies ordered by Dr. Julien Nordmann but not scheduled yet at this time.     Tobacco/Marijuana/Snuff/ETOH use: one pack per day for around 60 years and quit 02/17/2017  Past/Anticipated interventions by cardiothoracic surgery, if any: n/a  Past/Anticipated interventions by medical oncology, if any: pending pathology and scan results, will see Dr. Julien Nordmann to reevaluate once those are in  Signs/Symptoms  Weight changes, if any: yes, 10 lb in three weeks  Respiratory complaints, if any: Reports a dry cough. States, "I feel congestion in my chest but I can't cough it up." Reports  dyspnea on exertion. Reports hoarseness.  Hemoptysis, if any: no  Pain issues, if any:  Low back radiating down left leg x 2 days worse with movement.   SAFETY ISSUES:  Prior radiation? no  Pacemaker/ICD? no   Possible current pregnancy?no  Is the patient on methotrexate? no  Current Complaints / other details: 76 year old female. Married.

## 2017-03-01 DIAGNOSIS — C3412 Malignant neoplasm of upper lobe, left bronchus or lung: Secondary | ICD-10-CM | POA: Diagnosis not present

## 2017-03-02 ENCOUNTER — Encounter: Payer: Self-pay | Admitting: Radiation Oncology

## 2017-03-02 ENCOUNTER — Ambulatory Visit
Admission: RE | Admit: 2017-03-02 | Discharge: 2017-03-02 | Disposition: A | Discharge status: Home / Self Care | Payer: Medicare Other | Source: Ambulatory Visit | Attending: Radiation Oncology | Admitting: Radiation Oncology

## 2017-03-02 VITALS — BP 132/79 | HR 92 | Temp 98.6°F | Resp 20 | Ht 63.0 in | Wt 169.4 lb

## 2017-03-02 DIAGNOSIS — Z510 Encounter for antineoplastic radiation therapy: Secondary | ICD-10-CM | POA: Diagnosis not present

## 2017-03-02 DIAGNOSIS — G8929 Other chronic pain: Secondary | ICD-10-CM | POA: Diagnosis not present

## 2017-03-02 DIAGNOSIS — K219 Gastro-esophageal reflux disease without esophagitis: Secondary | ICD-10-CM | POA: Diagnosis not present

## 2017-03-02 DIAGNOSIS — Z87891 Personal history of nicotine dependence: Secondary | ICD-10-CM | POA: Insufficient documentation

## 2017-03-02 DIAGNOSIS — I10 Essential (primary) hypertension: Secondary | ICD-10-CM | POA: Diagnosis not present

## 2017-03-02 DIAGNOSIS — M545 Low back pain: Secondary | ICD-10-CM | POA: Diagnosis not present

## 2017-03-02 DIAGNOSIS — Z96641 Presence of right artificial hip joint: Secondary | ICD-10-CM | POA: Diagnosis not present

## 2017-03-02 DIAGNOSIS — E785 Hyperlipidemia, unspecified: Secondary | ICD-10-CM | POA: Insufficient documentation

## 2017-03-02 DIAGNOSIS — Z90710 Acquired absence of both cervix and uterus: Secondary | ICD-10-CM | POA: Insufficient documentation

## 2017-03-02 DIAGNOSIS — D219 Benign neoplasm of connective and other soft tissue, unspecified: Secondary | ICD-10-CM | POA: Diagnosis not present

## 2017-03-02 DIAGNOSIS — C7931 Secondary malignant neoplasm of brain: Secondary | ICD-10-CM | POA: Insufficient documentation

## 2017-03-02 DIAGNOSIS — Z7982 Long term (current) use of aspirin: Secondary | ICD-10-CM | POA: Insufficient documentation

## 2017-03-02 DIAGNOSIS — Z79899 Other long term (current) drug therapy: Secondary | ICD-10-CM | POA: Diagnosis not present

## 2017-03-02 DIAGNOSIS — R918 Other nonspecific abnormal finding of lung field: Secondary | ICD-10-CM

## 2017-03-02 DIAGNOSIS — M810 Age-related osteoporosis without current pathological fracture: Secondary | ICD-10-CM | POA: Diagnosis not present

## 2017-03-02 DIAGNOSIS — Z800 Family history of malignant neoplasm of digestive organs: Secondary | ICD-10-CM | POA: Insufficient documentation

## 2017-03-02 DIAGNOSIS — C771 Secondary and unspecified malignant neoplasm of intrathoracic lymph nodes: Secondary | ICD-10-CM | POA: Diagnosis not present

## 2017-03-02 DIAGNOSIS — J449 Chronic obstructive pulmonary disease, unspecified: Secondary | ICD-10-CM | POA: Insufficient documentation

## 2017-03-02 DIAGNOSIS — C3412 Malignant neoplasm of upper lobe, left bronchus or lung: Secondary | ICD-10-CM | POA: Insufficient documentation

## 2017-03-02 DIAGNOSIS — Z803 Family history of malignant neoplasm of breast: Secondary | ICD-10-CM | POA: Diagnosis not present

## 2017-03-02 DIAGNOSIS — D381 Neoplasm of uncertain behavior of trachea, bronchus and lung: Secondary | ICD-10-CM

## 2017-03-02 HISTORY — DX: Solitary pulmonary nodule: R91.1

## 2017-03-02 NOTE — Progress Notes (Signed)
See progress note under physician encounter. 

## 2017-03-02 NOTE — Progress Notes (Signed)
Radiation Oncology         [phone] ________________________________  Initial Outpatient Consultation  Name: Jacqueline Kidd MRN: [id number]  Date of Service: 03/02/2017 DOB: 1940-09-06  PN:TIRWERX, [place] Hun, MD  Tyler Pita, MD   REFERRING PHYSICIAN: Curt Bears, MD, PhD  DIAGNOSIS: 76 yo woman with probable stage III nsclc pending PET on 8/29 and MRI on 8/30 followed by biopsy    ICD-10-CM   1. Mass of upper lobe of left lung R91.8   2. Lung mass R91.8     HISTORY OF PRESENT ILLNESS: Jacqueline Kidd is a 76 y.o. female seen at the request of Dr. Julien Nordmann for evaluation and consideration of radiotherapy for her highly suspicious lung cancer. She presented to her PCP, Dr. Evette Doffing, two months ago with symptoms of hoarseness of her voice. She noted no improvement with conservative measures.  A few weeks later she presented to Dr. Lucia Gaskins in ENT for evaluation. She had laryngoscopy that showed paralysis of the left vocal cord. Dr. Lucia Gaskins ordered a CT scan of the neck, performed on 02/13/2017, which showed a left upper lobe lung mass measuring 5.5 x 5.2 x 4.7 cm, highly concerning for a primary bronchogenic neoplasm. There was left hilar and mediastinal adenopathy with soft tissue subjacent to the aortic arch, likely impacting the left recurrent laryngeal nerve. There was also laterally displaced left true vocal cord and irregularity of the left false cord, likely related to vocal cord paralysis associated with the mediastinal tumor.   The patient was referred to medical oncology for evaluation. She was seen by Dr. Julien Nordmann in the Multidisciplinary Thoracic Oncology clinic on 02/19/2017 who ordered a PET scan for 2020/08/1416 for staging and biopsy planning and MRI of the brain for 03/05/2017. She will revisit Dr. Julien Nordmann once her results are in for further recommendation regarding her systemic options.  The patient then had CT scan of the chest on 02/23/2017 which showed a large left upper  lobe mass extending to the pleural surface of the mediastinum at the level of the AP window, suspicious for mediastinal invasion. There was irregular metastatic left lower paratracheal and AP window adenopathy as well as subcarinal adenopathy. Findings were consistent with bronchogenic carcinoma with mediastinal disease. The patient presents today for evaluation of her disease and to discuss potential radiotherapy options.    PREVIOUS RADIATION THERAPY: No  PAST MEDICAL HISTORY:  Past Medical History:  Diagnosis Date  . Chronic low back pain   . COPD (chronic obstructive pulmonary disease) ([place])   . GERD (gastroesophageal reflux disease)   . Hyperlipidemia   . Hypertension   . Lesion of left lung   . Osteoporosis       PAST SURGICAL HISTORY: Past Surgical History:  Procedure Laterality Date  . ABDOMINAL HYSTERECTOMY    . TOTAL HIP ARTHROPLASTY     right    FAMILY HISTORY:  Family History  Problem Relation Age of Onset  . Cancer Mother        breast  . Cancer Father        esophageal  . Cancer Sister        breast    SOCIAL HISTORY:  Social History   Social History  . Marital status: Married    Spouse name: N/A  . Number of children: N/A  . Years of education: N/A   Occupational History  . Not on file.   Social History Main Topics  . Smoking status: Former Smoker    Packs/day: 1.00  Years: 57.00    Quit date: 02/16/2017  . Smokeless tobacco: Never Used  . Alcohol use No  . Drug use: No  . Sexual activity: No   Other Topics Concern  . Not on file   Social History Narrative  . No narrative on file  The patient is married and lives in [place].  ALLERGIES: Fosamax [alendronate] and Lisinopril-hydrochlorothiazide  MEDICATIONS:  Current Outpatient Prescriptions  Medication Sig Dispense Refill  . acetaminophen (TYLENOL) 325 MG tablet Take 650 mg by mouth every 6 (six) hours as needed.    Marland Kitchen amLODipine (NORVASC) 2.5 MG tablet Take 1 tablet (2.5 mg  total) by mouth daily. 90 tablet 1  . aspirin EC 81 MG tablet Take 81 mg by mouth daily.    . cholecalciferol (VITAMIN D) 1000 units tablet Take 1,000 Units by mouth daily.    . Omega-3 Fatty Acids (FISH OIL) 1000 MG CAPS Take 1 capsule by mouth daily.    . cetirizine (ZYRTEC) 10 MG tablet Take 1 tablet (10 mg total) by mouth daily. (Patient not taking: Reported on 03/02/2017) 30 tablet 11   No current facility-administered medications for this encounter.     REVIEW OF SYSTEMS:  On review of systems, the patient reports that she is doing well overall. She denies any chest pain, shortness of breath, fevers, chills, or night sweats. She reports unintended weight loss in the last 3 weeks. She also reports a dry cough, dyspnea on exertion, and hoarseness. She denies hemoptysis. She denies any bowel or bladder disturbances, and denies abdominal pain, nausea or vomiting. She reports low back pain the past 2 days that radiates down her left leg, exacerbated by movement. A complete review of systems is obtained and is otherwise negative.    PHYSICAL EXAM:  Wt Readings from Last 3 Encounters:  03/02/17 169 lb 6.4 oz (76.8 kg)  02/19/17 170 lb 6.4 oz (77.3 kg)  02/16/17 171 lb 6.4 oz (77.7 kg)   Temp Readings from Last 3 Encounters:  03/02/17 98.6 F (37 C) (Oral)  02/19/17 98.6 F (37 C) (Oral)  02/16/17 97.9 F (36.6 C) (Oral)   BP Readings from Last 3 Encounters:  03/02/17 132/79  02/19/17 133/65  02/16/17 136/82   Pulse Readings from Last 3 Encounters:  03/02/17 92  02/19/17 87  02/16/17 93   Pain Assessment Pain Score: 0-No pain/10  In general this is a well appearing Caucasian woman in no acute distress. She is alert and oriented x4 and appropriate throughout the examination. HEENT reveals that the patient is normocephalic, atraumatic. EOMs are intact. PERRLA. Skin is intact without any evidence of gross lesions. Cardiovascular exam reveals a regular rate and rhythm, no clicks rubs  or murmurs are auscultated. Chest is clear to auscultation bilaterally. Lymphatic assessment is performed and does not reveal any adenopathy in the cervical, supraclavicular, axillary, or inguinal chains. Abdomen has active bowel sounds in all quadrants and is intact. The abdomen is soft, non tender, non distended. Lower extremities are negative for pretibial pitting edema, deep calf tenderness, cyanosis or clubbing.   KPS = 90  100 - Normal; no complaints; no evidence of disease. 90   - Able to carry on normal activity; minor signs or symptoms of disease. 80   - Normal activity with effort; some signs or symptoms of disease. 84   - Cares for self; unable to carry on normal activity or to do active work. 60   - Requires occasional assistance, but is able to  care for most of his personal needs. 50   - Requires considerable assistance and frequent medical care. 27   - Disabled; requires special care and assistance. 55   - Severely disabled; hospital admission is indicated although death not imminent. 30   - Very sick; hospital admission necessary; active supportive treatment necessary. 10   - Moribund; fatal processes progressing rapidly. 0     - Dead  Karnofsky DA, Abelmann [place], Craver LS and Burchenal [place] [phone]) The use of the nitrogen mustards in the palliative treatment of carcinoma: with particular reference to bronchogenic carcinoma Cancer 1 [id number]  LABORATORY DATA:  Lab Results  Component Value Date   WBC 8.3 02/19/2017   HGB 14.2 02/19/2017   HCT 42.4 02/19/2017   MCV 94.7 02/19/2017   PLT 239 02/19/2017   Lab Results  Component Value Date   NA 140 02/19/2017   K 4.1 02/19/2017   CL 101 07/04/2016   CO2 26 02/19/2017   Lab Results  Component Value Date   ALT 34 02/19/2017   AST 47 (H) 02/19/2017   ALKPHOS 110 02/19/2017   BILITOT 0.69 02/19/2017     RADIOGRAPHY: Ct Soft Tissue Neck W Contrast  Result Date: 02/13/2017 CLINICAL DATA:  Dysphonia for 3 weeks.  Smoking  history. Creatinine was obtained on site at [facility] at [address]Results: Creatinine 0.7 mg/dL. EXAM: CT NECK WITH CONTRAST TECHNIQUE: Multidetector CT imaging of the neck was performed using the standard protocol following the bolus administration of intravenous contrast. CONTRAST:  67mL ISOVUE-300 IOPAMIDOL (ISOVUE-300) INJECTION 61% COMPARISON:  None. FINDINGS: Pharynx and larynx: Asymmetric soft tissue and irregularity is present at the posterior larynx and false cord on the left. The left vocal cord is deviated laterally. There is mild diffuse prominence of soft tissue involving the palatine tonsils and lingual tonsils. A discrete mass lesion is not identified. The nasopharynx is unremarkable. No other focal mucosal lesions are present. Salivary glands: The submandibular glands are normal bilaterally. 2 posterior inferior left parotid lesions are noted. The larger measures 10 x 15 mm on axial images. Both lesions are approximately 8 mm on coronal imaging. A third lesion measures 5 mm maximally. The submandibular glands are within normal limits bilaterally. Thyroid: Negative Lymph nodes: Subcentimeter posterior left level 3 lymph nodes are present. There is no significant cervical adenopathy. Vascular: Atherosclerotic calcifications are present at the aortic arch. An aberrant left subclavian artery is noted. Limited intracranial: Within normal limits. Visualized orbits: Bilateral lens replacements are present. The visualize globes and orbits are normal. Mastoids and visualized paranasal sinuses: The visualized paranasal sinuses and mastoid air cells are clear. Skeleton: Multilevel uncovertebral spurring is present. Vertebral body heights alignment are maintained. No focal lytic or blastic lesions are present. Osseous foraminal narrowing is greatest on the right at C5-6 and C6-7 and on the left at C3-4. Upper chest: An irregular left upper lobe mass measures 5.2 x 4.7 x 5.5 cm. Left  prevascular lymph nodes measure up to 6 mm in short axis. Diffuse lymphoid tissue is present at the left hilum, the AP window, and extending into the subcarinal space. A pretracheal lymph node measures 14 mm. Tumor involves the chest wall. No definite osseous destruction is evident. IMPRESSION: 1. Left upper lobe lung mass measures 5.5 x 5.2 x 4.7 cm, highly concerning for a primary bronchogenic neoplasm. Recommend CT of the chest with contrast for further evaluation and staging. Tumor does involve the chest wall. 2. Left hilar and mediastinal adenopathy  with soft tissue subjacent to the aortic arch, likely impacting the left recurrent laryngeal nerve. 3. Laterally displaced left true vocal cord and irregularity of the left false cord likely related to vocal cord paralysis associated with the mediastinal tumor. 4. Moderate prominence of lymphoid tissue involving the palatine and lingual tonsils. Recommend direct visualization. Electronically Signed   By: San Morelle M.D.   On: 02/13/2017 15:59   Ct Chest W Contrast  Result Date: 02/23/2017 CLINICAL DATA:  Hoarseness for 1 month, COPD, ex-smoker EXAM: CT CHEST WITH CONTRAST TECHNIQUE: Multidetector CT imaging of the chest was performed during intravenous contrast administration. CONTRAST:  45mL ISOVUE-300 IOPAMIDOL (ISOVUE-300) INJECTION 61% COMPARISON:  Neck CT 02/13/2017 FINDINGS: Cardiovascular: Apparent RIGHT subclavian artery. No pericardial fluid. Coronary calcifications Mediastinum/Nodes: No axillary supraclavicular adenopathy. Regular adenopathy in the RIGHT lower paratracheal LEFT lower paratracheal and AP window measuring up to 2 cm short axis. Enlarged subcarinal lymph node with central calcification measures 2.2 cm short axis. No enlarged supraclavicular nodes. Lungs/Pleura: LEFT upper lobe mass measures 5.1 by 4.3 cm. Mass extends into the AP window. No lower lobe nodularity the LEFT.  The RIGHT lung is clear. Upper Abdomen: Limited view of  the liver, kidneys, pancreas are unremarkable. Normal adrenal glands. Musculoskeletal: No aggressive osseous lesion. IMPRESSION: 1. Large LEFT upper lobe mass extends to the pleural surface of the mediastinum at the level of the AP window. Suspect mediastinal invasion at the level of the AP window 2. Irregular metastatic LEFT lower paratracheal and AP window adenopathy as well subcarinal adenopathy. 3. Findings consistent bronchogenic carcinoma with mediastinal metastasis. Recommend FDG PET scan for staging and biopsy planning. These results will be called to the ordering clinician or representative by the Radiologist Assistant, and communication documented in the PACS or zVision Dashboard. Electronically Signed   By: Suzy Bouchard M.D.   On: 02/23/2017 17:03      IMPRESSION/PLAN: 1.  76 y.o. woman with at least Stage III lung cancer. Today, we talked to the patient and family about the findings and work-up thus far.  We discussed the natural history of lung cancer and general treatment, highlighting the role of radiotherapy in the management.  We discussed the available radiation techniques, and focused on the details of logistics and delivery. Provided that no additional disease is identified, she would be a good candidate for concurrent radiotherapy with chemotherapy medication.  We reviewed the anticipated acute and late sequelae associated with radiation in this setting.   The patient would like to proceed with radiation and will be scheduled for CT simulation once she has undergone PET, and biopsy.     Carola Rhine, PAC Seen with ------------------------------------------------   Tyler Pita, MD [facility] Medical Director and Director of Stereotactic Radiosurgery Direct Dial: [phone]  Fax: [phone] [email or web address]  Skype  [email or web address]  This document serves as a record of services personally performed by Tyler Pita, MD and Shona Simpson, PA-C.  It was created on their behalf by Rae Lips, a trained medical scribe. The creation of this record is based on the scribe's personal observations and the providers' statements to them. This document has been checked and approved by the attending providers.

## 2017-03-04 ENCOUNTER — Ambulatory Visit (HOSPITAL_COMMUNITY)
Admission: RE | Admit: 2017-03-04 | Discharge: 2017-03-04 | Disposition: A | Discharge status: Home / Self Care | Payer: Medicare Other | Source: Ambulatory Visit | Attending: Internal Medicine | Admitting: Internal Medicine

## 2017-03-04 ENCOUNTER — Ambulatory Visit (HOSPITAL_BASED_OUTPATIENT_CLINIC_OR_DEPARTMENT_OTHER): Payer: Medicare Other | Admitting: Internal Medicine

## 2017-03-04 ENCOUNTER — Encounter: Payer: Self-pay | Admitting: Internal Medicine

## 2017-03-04 VITALS — BP 127/55 | HR 88 | Temp 98.2°F | Resp 17 | Wt 169.3 lb

## 2017-03-04 DIAGNOSIS — R0609 Other forms of dyspnea: Secondary | ICD-10-CM

## 2017-03-04 DIAGNOSIS — R918 Other nonspecific abnormal finding of lung field: Secondary | ICD-10-CM | POA: Diagnosis not present

## 2017-03-04 DIAGNOSIS — I700 Atherosclerosis of aorta: Secondary | ICD-10-CM | POA: Insufficient documentation

## 2017-03-04 DIAGNOSIS — R221 Localized swelling, mass and lump, neck: Secondary | ICD-10-CM | POA: Diagnosis not present

## 2017-03-04 DIAGNOSIS — K118 Other diseases of salivary glands: Secondary | ICD-10-CM

## 2017-03-04 DIAGNOSIS — I2510 Atherosclerotic heart disease of native coronary artery without angina pectoris: Secondary | ICD-10-CM | POA: Diagnosis not present

## 2017-03-04 DIAGNOSIS — K8020 Calculus of gallbladder without cholecystitis without obstruction: Secondary | ICD-10-CM | POA: Diagnosis not present

## 2017-03-04 DIAGNOSIS — M545 Low back pain: Secondary | ICD-10-CM | POA: Diagnosis not present

## 2017-03-04 DIAGNOSIS — C778 Secondary and unspecified malignant neoplasm of lymph nodes of multiple regions: Secondary | ICD-10-CM | POA: Diagnosis not present

## 2017-03-04 DIAGNOSIS — M8580 Other specified disorders of bone density and structure, unspecified site: Secondary | ICD-10-CM | POA: Insufficient documentation

## 2017-03-04 DIAGNOSIS — R05 Cough: Secondary | ICD-10-CM | POA: Diagnosis not present

## 2017-03-04 DIAGNOSIS — R599 Enlarged lymph nodes, unspecified: Secondary | ICD-10-CM | POA: Diagnosis not present

## 2017-03-04 DIAGNOSIS — C3412 Malignant neoplasm of upper lobe, left bronchus or lung: Secondary | ICD-10-CM | POA: Diagnosis not present

## 2017-03-04 LAB — GLUCOSE, CAPILLARY: Glucose-Capillary: 117 mg/dL — ABNORMAL HIGH (ref 65–99)

## 2017-03-04 MED ORDER — FLUDEOXYGLUCOSE F - 18 (FDG) INJECTION
9.9000 | Freq: Once | INTRAVENOUS | Status: AC | PRN
  Administered 2017-03-04: 9.9 via INTRAVENOUS

## 2017-03-04 NOTE — Progress Notes (Signed)
START ON PATHWAY REGIMEN - Non-Small Cell Lung     Administer weekly:     Paclitaxel      Carboplatin   **Always confirm dose/schedule in your pharmacy ordering system**    Patient Characteristics: Stage III - Unresectable, PS = 0, 1 AJCC T Category: T2b Current Disease Status: No Distant Mets or Local Recurrence AJCC N Category: N3 AJCC M Category: M0 AJCC 8 Stage Grouping: IIIB Performance Status: PS = 0, 1 Intent of Therapy: Curative Intent, Discussed with Patient

## 2017-03-04 NOTE — Progress Notes (Signed)
[place] Telephone:([phone]) [phone]   Fax:(336) [phone]  OFFICE PROGRESS NOTE  Jacqueline Maize, MD [address] [place] [address]  DIAGNOSIS: stage IIIB(T2b, N3, M0)  lung cancer pending further staging workup and tissue diagnosis. The patient presented with large left upper lobe lung mass in addition to left hilar and mediastinal lymphadenopathy and left true vocal cord paralysis.  PRIOR THERAPY: None  CURRENT THERAPY: Concurrent chemoradiation with weekly carboplatin for AUC of 2 and paclitaxel 45 MG/M2. First dose 03/16/2017.  INTERVAL HISTORY: Jacqueline Kidd 76 y.o. female returns to the clinic today for follow-up visit accompanied by her husband. The patient is feeling fine today with no specific complaints except for low back pain. She denied having any significant chest pain but continues to have shortness breath with exertion and mild cough with no hemoptysis. She denied having any current fever or chills. She has no nausea, vomiting, diarrhea or constipation. She denied having any significant weight loss or night sweats. She had a PET scan performed earlier today and she is here for evaluation and discussion of her scan results and treatment options. She was seen recently by Dr. Tammi Kidd for evaluation and discussion of the radiotherapy option.  MEDICAL HISTORY: Past Medical History:  Diagnosis Date  . Chronic low back pain   . COPD (chronic obstructive pulmonary disease) ([place])   . GERD (gastroesophageal reflux disease)   . Hyperlipidemia   . Hypertension   . Lesion of left lung   . Osteoporosis     ALLERGIES:  is allergic to fosamax [alendronate] and lisinopril-hydrochlorothiazide.  MEDICATIONS:  Current Outpatient Prescriptions  Medication Sig Dispense Refill  . acetaminophen (TYLENOL) 325 MG tablet Take 650 mg by mouth every 6 (six) hours as needed.    Marland Kitchen amLODipine (NORVASC) 2.5 MG tablet Take 1 tablet (2.5 mg total) by mouth daily. 90 tablet 1    . aspirin EC 81 MG tablet Take 81 mg by mouth daily.    . cholecalciferol (VITAMIN D) 1000 units tablet Take 1,000 Units by mouth daily.    . Omega-3 Fatty Acids (FISH OIL) 1000 MG CAPS Take 1 capsule by mouth daily.    . cetirizine (ZYRTEC) 10 MG tablet Take 1 tablet (10 mg total) by mouth daily. (Patient not taking: Reported on 03/02/2017) 30 tablet 11   No current facility-administered medications for this visit.     SURGICAL HISTORY:  Past Surgical History:  Procedure Laterality Date  . ABDOMINAL HYSTERECTOMY    . TOTAL HIP ARTHROPLASTY     right    REVIEW OF SYSTEMS:  Constitutional: positive for fatigue Eyes: negative Ears, nose, mouth, throat, and face: negative Respiratory: positive for cough and dyspnea on exertion Cardiovascular: negative Gastrointestinal: negative Genitourinary:negative Integument/breast: negative Hematologic/lymphatic: negative Musculoskeletal:positive for back pain Neurological: negative Behavioral/Psych: negative Endocrine: negative Allergic/Immunologic: negative   PHYSICAL EXAMINATION: General appearance: alert, cooperative, fatigued and no distress Head: Normocephalic, without obvious abnormality, atraumatic Neck: no adenopathy, no JVD, supple, symmetrical, trachea midline and thyroid not enlarged, symmetric, no tenderness/mass/nodules Lymph nodes: Cervical, supraclavicular, and axillary nodes normal. Resp: wheezes bilaterally Back: symmetric, no curvature. ROM normal. No CVA tenderness. Cardio: regular rate and rhythm, S1, S2 normal, no murmur, click, rub or gallop GI: soft, non-tender; bowel sounds normal; no masses,  no organomegaly Extremities: extremities normal, atraumatic, no cyanosis or edema Neurologic: Alert and oriented X 3, normal strength and tone. Normal symmetric reflexes. Normal coordination and gait  ECOG PERFORMANCE STATUS: 1 -  Symptomatic but completely ambulatory  Blood pressure (!) 127/55, pulse 88, temperature 98.2 F  (36.8 C), temperature source Oral, resp. rate 17, weight 169 lb 4.8 oz (76.8 kg), SpO2 98 %.  LABORATORY DATA: Lab Results  Component Value Date   WBC 8.3 02/19/2017   HGB 14.2 02/19/2017   HCT 42.4 02/19/2017   MCV 94.7 02/19/2017   PLT 239 02/19/2017      Chemistry      Component Value Date/Time   NA 140 02/19/2017 1314   K 4.1 02/19/2017 1314   CL 101 07/04/2016 1211   CO2 26 02/19/2017 1314   BUN 8.3 02/19/2017 1314   CREATININE 0.8 02/19/2017 1314      Component Value Date/Time   CALCIUM 9.7 02/19/2017 1314   ALKPHOS 110 02/19/2017 1314   AST 47 (H) 02/19/2017 1314   ALT 34 02/19/2017 1314   BILITOT 0.69 02/19/2017 1314       RADIOGRAPHIC STUDIES: Ct Soft Tissue Neck W Contrast  Result Date: 02/13/2017 CLINICAL DATA:  Dysphonia for 3 weeks.  Smoking history. Creatinine was obtained on site at [facility] at [address]Results: Creatinine 0.7 mg/dL. EXAM: CT NECK WITH CONTRAST TECHNIQUE: Multidetector CT imaging of the neck was performed using the standard protocol following the bolus administration of intravenous contrast. CONTRAST:  77mL ISOVUE-300 IOPAMIDOL (ISOVUE-300) INJECTION 61% COMPARISON:  None. FINDINGS: Pharynx and larynx: Asymmetric soft tissue and irregularity is present at the posterior larynx and false cord on the left. The left vocal cord is deviated laterally. There is mild diffuse prominence of soft tissue involving the palatine tonsils and lingual tonsils. A discrete mass lesion is not identified. The nasopharynx is unremarkable. No other focal mucosal lesions are present. Salivary glands: The submandibular glands are normal bilaterally. 2 posterior inferior left parotid lesions are noted. The larger measures 10 x 15 mm on axial images. Both lesions are approximately 8 mm on coronal imaging. A third lesion measures 5 mm maximally. The submandibular glands are within normal limits bilaterally. Thyroid: Negative Lymph nodes: Subcentimeter  posterior left level 3 lymph nodes are present. There is no significant cervical adenopathy. Vascular: Atherosclerotic calcifications are present at the aortic arch. An aberrant left subclavian artery is noted. Limited intracranial: Within normal limits. Visualized orbits: Bilateral lens replacements are present. The visualize globes and orbits are normal. Mastoids and visualized paranasal sinuses: The visualized paranasal sinuses and mastoid air cells are clear. Skeleton: Multilevel uncovertebral spurring is present. Vertebral body heights alignment are maintained. No focal lytic or blastic lesions are present. Osseous foraminal narrowing is greatest on the right at C5-6 and C6-7 and on the left at C3-4. Upper chest: An irregular left upper lobe mass measures 5.2 x 4.7 x 5.5 cm. Left prevascular lymph nodes measure up to 6 mm in short axis. Diffuse lymphoid tissue is present at the left hilum, the AP window, and extending into the subcarinal space. A pretracheal lymph node measures 14 mm. Tumor involves the chest wall. No definite osseous destruction is evident. IMPRESSION: 1. Left upper lobe lung mass measures 5.5 x 5.2 x 4.7 cm, highly concerning for a primary bronchogenic neoplasm. Recommend CT of the chest with contrast for further evaluation and staging. Tumor does involve the chest wall. 2. Left hilar and mediastinal adenopathy with soft tissue subjacent to the aortic arch, likely impacting the left recurrent laryngeal nerve. 3. Laterally displaced left true vocal cord and irregularity of the left false cord likely related to vocal cord paralysis associated with  the mediastinal tumor. 4. Moderate prominence of lymphoid tissue involving the palatine and lingual tonsils. Recommend direct visualization. Electronically Signed   By: San Morelle M.D.   On: 02/13/2017 15:59   Ct Chest W Contrast  Result Date: 02/23/2017 CLINICAL DATA:  Hoarseness for 1 month, COPD, ex-smoker EXAM: CT CHEST WITH CONTRAST  TECHNIQUE: Multidetector CT imaging of the chest was performed during intravenous contrast administration. CONTRAST:  71mL ISOVUE-300 IOPAMIDOL (ISOVUE-300) INJECTION 61% COMPARISON:  Neck CT 02/13/2017 FINDINGS: Cardiovascular: Apparent RIGHT subclavian artery. No pericardial fluid. Coronary calcifications Mediastinum/Nodes: No axillary supraclavicular adenopathy. Regular adenopathy in the RIGHT lower paratracheal LEFT lower paratracheal and AP window measuring up to 2 cm short axis. Enlarged subcarinal lymph node with central calcification measures 2.2 cm short axis. No enlarged supraclavicular nodes. Lungs/Pleura: LEFT upper lobe mass measures 5.1 by 4.3 cm. Mass extends into the AP window. No lower lobe nodularity the LEFT.  The RIGHT lung is clear. Upper Abdomen: Limited view of the liver, kidneys, pancreas are unremarkable. Normal adrenal glands. Musculoskeletal: No aggressive osseous lesion. IMPRESSION: 1. Large LEFT upper lobe mass extends to the pleural surface of the mediastinum at the level of the AP window. Suspect mediastinal invasion at the level of the AP window 2. Irregular metastatic LEFT lower paratracheal and AP window adenopathy as well subcarinal adenopathy. 3. Findings consistent bronchogenic carcinoma with mediastinal metastasis. Recommend FDG PET scan for staging and biopsy planning. These results will be called to the ordering clinician or representative by the Radiologist Assistant, and communication documented in the PACS or zVision Dashboard. Electronically Signed   By: Suzy Bouchard M.D.   On: 02/23/2017 17:03   Nm Pet Image Initial (pi) Skull Base To Thigh  Result Date: 2020/12/316 CLINICAL DATA:  Initial treatment strategy for staging of left upper lobe lung mass. EXAM: NUCLEAR MEDICINE PET SKULL BASE TO THIGH TECHNIQUE: 9.9 mCi F-18 FDG was injected intravenously. Full-ring PET imaging was performed from the skull base to thigh after the radiotracer. CT data was obtained and  used for attenuation correction and anatomic localization. FASTING BLOOD GLUCOSE:  Value: 117 mg/dl COMPARISON:  Chest CT 02/23/2017 FINDINGS: NECK: A deep left parotid nodule measures 1.3 x 0.8 cm and a S.U.V. max of 9.4 on image 21/series 4. Right true cord hypermetabolism is without well-defined CT correlate. This measures a S.U.V. max of 5.6 on image 36/series. Low left jugular hypermetabolic node measures 8 mm and a S.U.V. max of 6.5 on image 41/series 4. Bilateral carotid atherosclerosis. CHEST: Hypermetabolism corresponding to the previously described left upper lobe lung mass. Example at 5.1 x 4.2 cm and a S.U.V. max of 17.6. Hypermetabolic mediastinal nodes, including an index right paratracheal node which measures 11 mm and a S.U.V. max of 9.4 on image 53/series 4. Subcarinal adenopathy including at 1.5 cm and a S.U.V. max of 12.3. Chest findings otherwise deferred to recent diagnostic CT. Aortic and branch vessel atherosclerosis. Multivessel coronary artery atherosclerosis. ABDOMEN/PELVIS: No abdominopelvic nodal or parenchymal hypermetabolism identified. Normal adrenal glands. Small gallstones. Mild pancreatic atrophy. Tiny hiatal hernia. Retroaortic left renal vein. Degraded evaluation of the pelvis, secondary to beam hardening artifact from right hip arthroplasty. Pelvic floor laxity. SKELETON: Hypermetabolism corresponding to a mild superior endplate compression deformity at L3. No underlying osseous lesion identified. Osteopenia. IMPRESSION: 1. Left upper lobe primary bronchogenic carcinoma. 2. Nodal metastasis within the chest and low neck, as detailed above. 3. Deep left parotid hypermetabolic nodule could represent a metastatic node or a synchronous primary parotid neoplasm.  4. Coronary artery atherosclerosis. Aortic Atherosclerosis (ICD10-I70.0). 5. Mild L3 compression deformity, likely due to osteopenia. 6. Probable physiologic right sided vocal cord hypermetabolism. This could be re-evaluated  at followup or correlated with direct visualization. 7. Cholelithiasis. Electronically Signed   By: Abigail Miyamoto M.D.   On: 10/22/202018 15:18    ASSESSMENT AND PLAN: This is a very pleasant 76 years old white female with highly suspicious stage IIIB lung cancer, pending tissue diagnosis. The patient had a recent PET scan. I personally and independently reviewed the scan images and discuss the results with the patient and her husband. The patient is also scheduled to have MRI of the brain tomorrow to complete the staging workup of her disease. Her PET scan showed stage IIIB disease likely non-small cell lung cancer but tissue diagnosis is still pending. I will arrange for the patient to have CT-guided core biopsy of the left upper lobe lung mass in addition to ultrasound guided biopsy of the left gland. I discussed with the patient her treatment options. I recommended for the patient a course of concurrent chemoradiation with weekly carboplatin for AUC of 2 and paclitaxel 45 MG/M2. I discussed with the patient adverse effect of the chemotherapy including but not limited to alopecia, myelosuppression, nausea and vomiting, peripheral neuropathy, liver or renal dysfunction. I will arrange for the patient to have a chemotherapy education class before starting the first dose of his chemotherapy. She is expected to start the first cycle of this treatment on 03/16/2017. I will call her pharmacy with prescription for Compazine 10 mg by mouth every 6 hours as needed for nausea. She will come back for follow-up visit in 2 weeks for evaluation and management of any adverse effect of her treatment. For the back pain this is likely secondary to degenerative disc disease and the mild L3 compression deformity seen on the recent PET scan. She was advised to take Tylenol or ibuprofen as needed for her pain management. The patient was also advised to call immediately if she has any concerning symptoms in the  interval. The patient voices understanding of current disease status and treatment options and is in agreement with the current care plan. All questions were answered. The patient knows to call the clinic with any problems, questions or concerns. We can certainly see the patient much sooner if necessary.  I spent 15 minutes counseling the patient face to face. The total time spent in the appointment was 25 minutes.  Disclaimer: This note was dictated with voice recognition software. Similar sounding words can inadvertently be transcribed and may not be corrected upon review.

## 2017-03-05 ENCOUNTER — Ambulatory Visit
Admission: RE | Admit: 2017-03-05 | Discharge: 2017-03-05 | Disposition: A | Discharge status: Home / Self Care | Payer: Medicare Other | Source: Ambulatory Visit | Attending: Internal Medicine | Admitting: Internal Medicine

## 2017-03-05 DIAGNOSIS — C50919 Malignant neoplasm of unspecified site of unspecified female breast: Secondary | ICD-10-CM | POA: Diagnosis not present

## 2017-03-05 DIAGNOSIS — C7931 Secondary malignant neoplasm of brain: Secondary | ICD-10-CM | POA: Diagnosis not present

## 2017-03-05 DIAGNOSIS — R918 Other nonspecific abnormal finding of lung field: Secondary | ICD-10-CM

## 2017-03-05 MED ORDER — GADOBENATE DIMEGLUMINE 529 MG/ML IV SOLN
17.0000 mL | Freq: Once | INTRAVENOUS | Status: AC | PRN
  Administered 2017-03-05: 17 mL via INTRAVENOUS

## 2017-03-06 ENCOUNTER — Ambulatory Visit: Payer: Medicare Other | Admitting: Radiation Oncology

## 2017-03-06 ENCOUNTER — Telehealth: Payer: Self-pay

## 2017-03-06 NOTE — Telephone Encounter (Signed)
Call report on MRI brain done 8/30

## 2017-03-07 NOTE — Telephone Encounter (Signed)
This is Dr. Worthy Flank patient. I reviewed the report, she has brain mets, but small. I have not called pt yet.   Mohamed, please call pt when you have a chance. Thanks   Truitt Merle MD

## 2017-03-08 NOTE — Telephone Encounter (Signed)
I did update Dr. [name] with her MRI. She is seeing him for XRT. Thank you.

## 2017-03-10 ENCOUNTER — Other Ambulatory Visit: Payer: Self-pay | Admitting: Radiation Therapy

## 2017-03-10 ENCOUNTER — Telehealth: Payer: Self-pay | Admitting: Radiation Oncology

## 2017-03-10 DIAGNOSIS — C7949 Secondary malignant neoplasm of other parts of nervous system: Principal | ICD-10-CM

## 2017-03-10 DIAGNOSIS — C7931 Secondary malignant neoplasm of brain: Secondary | ICD-10-CM

## 2017-03-10 NOTE — Telephone Encounter (Addendum)
I spoke with the patient to let her know that we would still simulate tomorrow for the chest for palliative treatment over 10 fractions. She will be going for her CT biopsy on 9/12, and is to start chemo on 9/17 currently. Her brain MRI was reviewed and we discussed the plan to move forward with 3T MRI for [facility] treatment planning. She is aware of needing to meet with a neurosurgeon as well as a simulation session. She will be contacted by Mont Dutton our brain navigator to coordinate this.

## 2017-03-11 ENCOUNTER — Other Ambulatory Visit: Payer: Self-pay | Admitting: Urology

## 2017-03-11 ENCOUNTER — Other Ambulatory Visit: Payer: Self-pay | Admitting: Pediatrics

## 2017-03-11 ENCOUNTER — Ambulatory Visit
Admission: RE | Admit: 2017-03-11 | Discharge: 2017-03-11 | Disposition: A | Discharge status: Home / Self Care | Payer: Medicare Other | Source: Ambulatory Visit | Attending: Radiation Oncology | Admitting: Radiation Oncology

## 2017-03-11 DIAGNOSIS — Z7982 Long term (current) use of aspirin: Secondary | ICD-10-CM | POA: Diagnosis not present

## 2017-03-11 DIAGNOSIS — C7931 Secondary malignant neoplasm of brain: Secondary | ICD-10-CM | POA: Diagnosis not present

## 2017-03-11 DIAGNOSIS — G8929 Other chronic pain: Secondary | ICD-10-CM | POA: Diagnosis not present

## 2017-03-11 DIAGNOSIS — Z800 Family history of malignant neoplasm of digestive organs: Secondary | ICD-10-CM | POA: Diagnosis not present

## 2017-03-11 DIAGNOSIS — E785 Hyperlipidemia, unspecified: Secondary | ICD-10-CM | POA: Diagnosis not present

## 2017-03-11 DIAGNOSIS — M545 Low back pain: Secondary | ICD-10-CM | POA: Diagnosis not present

## 2017-03-11 DIAGNOSIS — Z79899 Other long term (current) drug therapy: Secondary | ICD-10-CM | POA: Diagnosis not present

## 2017-03-11 DIAGNOSIS — R918 Other nonspecific abnormal finding of lung field: Secondary | ICD-10-CM

## 2017-03-11 DIAGNOSIS — I10 Essential (primary) hypertension: Secondary | ICD-10-CM

## 2017-03-11 DIAGNOSIS — M810 Age-related osteoporosis without current pathological fracture: Secondary | ICD-10-CM | POA: Diagnosis not present

## 2017-03-11 DIAGNOSIS — C771 Secondary and unspecified malignant neoplasm of intrathoracic lymph nodes: Secondary | ICD-10-CM | POA: Diagnosis not present

## 2017-03-11 DIAGNOSIS — J449 Chronic obstructive pulmonary disease, unspecified: Secondary | ICD-10-CM | POA: Diagnosis not present

## 2017-03-11 DIAGNOSIS — C3412 Malignant neoplasm of upper lobe, left bronchus or lung: Secondary | ICD-10-CM

## 2017-03-11 DIAGNOSIS — K219 Gastro-esophageal reflux disease without esophagitis: Secondary | ICD-10-CM | POA: Diagnosis not present

## 2017-03-11 DIAGNOSIS — Z803 Family history of malignant neoplasm of breast: Secondary | ICD-10-CM | POA: Diagnosis not present

## 2017-03-11 DIAGNOSIS — Z96641 Presence of right artificial hip joint: Secondary | ICD-10-CM | POA: Diagnosis not present

## 2017-03-11 DIAGNOSIS — Z87891 Personal history of nicotine dependence: Secondary | ICD-10-CM | POA: Diagnosis not present

## 2017-03-11 DIAGNOSIS — D219 Benign neoplasm of connective and other soft tissue, unspecified: Secondary | ICD-10-CM | POA: Diagnosis not present

## 2017-03-11 DIAGNOSIS — Z510 Encounter for antineoplastic radiation therapy: Secondary | ICD-10-CM | POA: Diagnosis not present

## 2017-03-11 MED ORDER — LORAZEPAM 0.5 MG PO TABS: 0.5000 mg | ORAL_TABLET | ORAL | 0 refills | Status: DC | PRN

## 2017-03-11 NOTE — Progress Notes (Signed)
  Radiation Oncology         [phone] ________________________________  Name: OFELIA PODOLSKI MRN: [id number]  Date: 03/11/2017  DOB: [birth year]  SIMULATION AND TREATMENT PLANNING NOTE    ICD-10-CM   1. Primary cancer of left upper lobe of lung (HCC) C34.12     DIAGNOSIS:  76 yo woman with probable stage III nsclc of the right upper lung with right neck nodal disease and 2 brain metastases  NARRATIVE:  The patient was brought to the [facility].  Identity was confirmed.  All relevant records and images related to the planned course of therapy were reviewed.  The patient freely provided informed written consent to proceed with treatment after reviewing the details related to the planned course of therapy. The consent form was witnessed and verified by the simulation staff.  Then, the patient was set-up in a stable reproducible  supine position for radiation therapy.  CT images were obtained.  Surface markings were placed.  The CT images were loaded into the planning software.  Then the target and avoidance structures were contoured.  Treatment planning then occurred.  The radiation prescription was entered and confirmed.  Then, I designed and supervised the construction of a total of 6 medically necessary complex treatment devices, including a BodyFix immobilization mold custom fitted to the patient along with 5 multileaf collimators conformally shaped radiation around the treatment target while shielding critical structures such as the heart and spinal cord maximally.  I have requested : 3D Simulation  I have requested a DVH of the following structures: Left lung, right lung, spinal cord, heart, esophagus, and target.  I have ordered:Nutrition Consult  SPECIAL TREATMENT PROCEDURE:  The planned course of therapy using radiation constitutes a special treatment procedure. Special care is required in the management of this patient for the following reasons.  The patient will be receiving  concurrent chemotherapy requiring careful monitoring for increased toxicities of treatment including periodic laboratory values.  The special nature of the planned course of radiotherapy will require increased physician supervision and oversight to ensure patient's safety with optimal treatment outcomes.  PLAN:  The patient will receive 66 Gy in 33 fractions.  ________________________________  Sheral Apley [name], M.D.

## 2017-03-12 ENCOUNTER — Encounter: Payer: Self-pay | Admitting: *Deleted

## 2017-03-12 NOTE — Progress Notes (Signed)
Oncology Nurse Navigator Documentation  Oncology Nurse Navigator Flowsheets 03/12/2017  Navigator Location [facility]  Navigator Encounter Type Other/I followed up on Jacqueline Kidd's schedule. I also reviewed with Dr. Julien Nordmann. Per Dr. Julien Nordmann, I contacted scheduling to have her labs and chemo ed cancelled on 03/16/17.  I also requested them to call patient to re-schedule chemo ed after 03/18/17 and before 03/23/17.   Treatment Phase Pre-Tx/Tx Discussion  Barriers/Navigation Needs Coordination of Care  Interventions Coordination of Care  Coordination of Care Other  Acuity Level 2  Time Spent with Patient 15

## 2017-03-13 ENCOUNTER — Other Ambulatory Visit: Payer: Self-pay | Admitting: *Deleted

## 2017-03-13 ENCOUNTER — Telehealth: Payer: Self-pay | Admitting: Internal Medicine

## 2017-03-13 DIAGNOSIS — C3412 Malignant neoplasm of upper lobe, left bronchus or lung: Secondary | ICD-10-CM

## 2017-03-13 NOTE — Telephone Encounter (Signed)
sw pt to confirm 9/13 chemo class at 10am per sch msg

## 2017-03-16 ENCOUNTER — Encounter: Payer: Self-pay | Admitting: Pediatrics

## 2017-03-16 ENCOUNTER — Other Ambulatory Visit: Payer: Medicare Other

## 2017-03-16 ENCOUNTER — Ambulatory Visit (INDEPENDENT_AMBULATORY_CARE_PROVIDER_SITE_OTHER): Payer: Medicare Other | Admitting: Pediatrics

## 2017-03-16 VITALS — BP 119/68 | HR 97 | Temp 98.7°F | Resp 20 | Ht 63.0 in | Wt 163.0 lb

## 2017-03-16 DIAGNOSIS — C3412 Malignant neoplasm of upper lobe, left bronchus or lung: Secondary | ICD-10-CM | POA: Diagnosis not present

## 2017-03-16 DIAGNOSIS — J449 Chronic obstructive pulmonary disease, unspecified: Secondary | ICD-10-CM | POA: Diagnosis not present

## 2017-03-16 DIAGNOSIS — M545 Low back pain: Secondary | ICD-10-CM | POA: Diagnosis not present

## 2017-03-16 DIAGNOSIS — Z96641 Presence of right artificial hip joint: Secondary | ICD-10-CM | POA: Diagnosis not present

## 2017-03-16 DIAGNOSIS — I10 Essential (primary) hypertension: Secondary | ICD-10-CM

## 2017-03-16 DIAGNOSIS — Z800 Family history of malignant neoplasm of digestive organs: Secondary | ICD-10-CM | POA: Diagnosis not present

## 2017-03-16 DIAGNOSIS — E785 Hyperlipidemia, unspecified: Secondary | ICD-10-CM | POA: Diagnosis not present

## 2017-03-16 DIAGNOSIS — Z87891 Personal history of nicotine dependence: Secondary | ICD-10-CM | POA: Diagnosis not present

## 2017-03-16 DIAGNOSIS — M810 Age-related osteoporosis without current pathological fracture: Secondary | ICD-10-CM | POA: Diagnosis not present

## 2017-03-16 DIAGNOSIS — S22000D Wedge compression fracture of unspecified thoracic vertebra, subsequent encounter for fracture with routine healing: Secondary | ICD-10-CM

## 2017-03-16 DIAGNOSIS — G8929 Other chronic pain: Secondary | ICD-10-CM | POA: Diagnosis not present

## 2017-03-16 DIAGNOSIS — Z803 Family history of malignant neoplasm of breast: Secondary | ICD-10-CM | POA: Diagnosis not present

## 2017-03-16 DIAGNOSIS — Z79899 Other long term (current) drug therapy: Secondary | ICD-10-CM | POA: Diagnosis not present

## 2017-03-16 DIAGNOSIS — C771 Secondary and unspecified malignant neoplasm of intrathoracic lymph nodes: Secondary | ICD-10-CM | POA: Diagnosis not present

## 2017-03-16 DIAGNOSIS — K219 Gastro-esophageal reflux disease without esophagitis: Secondary | ICD-10-CM | POA: Diagnosis not present

## 2017-03-16 DIAGNOSIS — C7931 Secondary malignant neoplasm of brain: Secondary | ICD-10-CM | POA: Diagnosis not present

## 2017-03-16 DIAGNOSIS — Z7982 Long term (current) use of aspirin: Secondary | ICD-10-CM | POA: Diagnosis not present

## 2017-03-16 DIAGNOSIS — Z510 Encounter for antineoplastic radiation therapy: Secondary | ICD-10-CM | POA: Diagnosis not present

## 2017-03-16 DIAGNOSIS — D219 Benign neoplasm of connective and other soft tissue, unspecified: Secondary | ICD-10-CM | POA: Diagnosis not present

## 2017-03-16 MED ORDER — TRAMADOL HCL 50 MG PO TABS: 50.0000 mg | ORAL_TABLET | Freq: Three times a day (TID) | ORAL | 0 refills | Status: DC | PRN

## 2017-03-16 MED ORDER — AMLODIPINE BESYLATE 2.5 MG PO TABS: 2.5000 mg | ORAL_TABLET | Freq: Every day | ORAL | 1 refills | Status: DC

## 2017-03-16 NOTE — Progress Notes (Signed)
  Subjective:   Patient ID: Jacqueline Kidd, female    DOB: [birth year], 76 y.o.   MRN: [id number] CC: Follow-up (3 week); Arthritis; and Back Pain  HPI: Jacqueline Kidd is a 76 y.o. female presenting for Follow-up (3 week); Arthritis; and Back Pain  Has markings for her radiation therapy  MRI of brain with mets  Having a lot of pain in her back PET scan didn't show metastasis in back Does have h/o compression fracture Has tried ibuprofen, didn't help Tylenol with minimal relief at higher doses Getting up and down is hard Once she is up can keep moving, pain is manageable  Has biopsies scheduled for this week  Relevant past medical, surgical, family and social history reviewed. Allergies and medications reviewed and updated. History  Smoking Status  . Former Smoker  . Packs/day: 1.00  . Years: 57.00  . Quit date: 02/16/2017  Smokeless Tobacco  . Never Used   ROS: Per HPI   Objective:    BP 119/68   Pulse 97   Temp 98.7 F (37.1 C) (Oral)   Resp 20   Ht 5\' 3"  (1.6 m)   Wt 163 lb (73.9 kg)   SpO2 97%   BMI 28.87 kg/m   Wt Readings from Last 3 Encounters:  03/16/17 163 lb (73.9 kg)  03/04/17 169 lb 4.8 oz (76.8 kg)  03/02/17 169 lb 6.4 oz (76.8 kg)    Gen: NAD, alert, cooperative with exam, NCAT EYES: EOMI, no conjunctival injection, or no icterus CV: NRRR, normal S1/S2, no murmur, distal pulses 2+ b/l Resp: CTABL, no wheezes, normal WOB Abd: +BS, soft, NTND Ext: No edema, warm Neuro: Alert and oriented, strength equal b/l UE and LE, coordination grossly normal MSK: ttp lower lumbar spine, no ttp paraspinal muslces b/l  Assessment & Plan:  Hendy was seen today for follow-up, arthritis and back pain.  Diagnoses and all orders for this visit:  Closed compression fracture of thoracic vertebra with routine healing, subsequent encounter Ongoing pain in back, recently flared Trial of below Cont tylenol -     traMADol (ULTRAM) 50 MG tablet; Take 1 tablet (50 mg  total) by mouth every 8 (eight) hours as needed.  Essential hypertension Stable, cont medicine -     amLODipine (NORVASC) 2.5 MG tablet; Take 1 tablet (2.5 mg total) by mouth daily.  Primary cancer of left upper lobe of lung [facility]) Following with radiation oncology and oncology Not yet started treatment Feeling well other than back pain as above  Follow up plan: 6 mo, sooner if needed or pain in back not improving Assunta Found, MD [place]

## 2017-03-17 ENCOUNTER — Other Ambulatory Visit: Payer: Self-pay | Admitting: General Surgery

## 2017-03-17 ENCOUNTER — Encounter: Payer: Self-pay | Admitting: *Deleted

## 2017-03-17 ENCOUNTER — Other Ambulatory Visit: Payer: Self-pay | Admitting: Radiology

## 2017-03-17 ENCOUNTER — Other Ambulatory Visit: Payer: Self-pay | Admitting: Student

## 2017-03-18 ENCOUNTER — Ambulatory Visit: Payer: Medicare Other | Admitting: Radiation Oncology

## 2017-03-18 ENCOUNTER — Other Ambulatory Visit: Payer: Self-pay | Admitting: Internal Medicine

## 2017-03-18 ENCOUNTER — Ambulatory Visit (HOSPITAL_COMMUNITY)
Admission: RE | Admit: 2017-03-18 | Discharge: 2017-03-18 | Disposition: A | Discharge status: Home / Self Care | Payer: Medicare Other | Source: Ambulatory Visit | Attending: Internal Medicine | Admitting: Internal Medicine

## 2017-03-18 ENCOUNTER — Encounter (HOSPITAL_COMMUNITY): Payer: Self-pay

## 2017-03-18 DIAGNOSIS — C3412 Malignant neoplasm of upper lobe, left bronchus or lung: Secondary | ICD-10-CM | POA: Insufficient documentation

## 2017-03-18 DIAGNOSIS — R918 Other nonspecific abnormal finding of lung field: Secondary | ICD-10-CM

## 2017-03-18 DIAGNOSIS — K119 Disease of salivary gland, unspecified: Secondary | ICD-10-CM | POA: Diagnosis not present

## 2017-03-18 DIAGNOSIS — K118 Other diseases of salivary glands: Secondary | ICD-10-CM | POA: Diagnosis not present

## 2017-03-18 LAB — CBC
HEMATOCRIT: 42.2 % (ref 36.0–46.0)
HEMOGLOBIN: 13.9 g/dL (ref 12.0–15.0)
MCH: 31.2 pg (ref 26.0–34.0)
MCHC: 32.9 g/dL (ref 30.0–36.0)
MCV: 94.6 fL (ref 78.0–100.0)
Platelets: 242 10*3/uL (ref 150–400)
RBC: 4.46 MIL/uL (ref 3.87–5.11)
RDW: 13.9 % (ref 11.5–15.5)
WBC: 8.5 10*3/uL (ref 4.0–10.5)

## 2017-03-18 LAB — PROTIME-INR
INR: 1.04
Prothrombin Time: 13.5 seconds (ref 11.4–15.2)

## 2017-03-18 LAB — APTT: aPTT: 33 seconds (ref 24–36)

## 2017-03-18 MED ORDER — MIDAZOLAM HCL 2 MG/2ML IJ SOLN
INTRAMUSCULAR | Status: AC | PRN
Start: 2017-03-18 — End: 2017-03-18
  Administered 2017-03-18: 1 mg via INTRAVENOUS

## 2017-03-18 MED ORDER — ONDANSETRON HCL 4 MG/2ML IJ SOLN
4.0000 mg | Freq: Once | INTRAMUSCULAR | Status: AC
  Administered 2017-03-18: 4 mg via INTRAVENOUS
  Filled 2017-03-18: qty 2

## 2017-03-18 MED ORDER — LIDOCAINE HCL (PF) 1 % IJ SOLN
INTRAMUSCULAR | Status: AC
  Filled 2017-03-18: qty 30

## 2017-03-18 MED ORDER — SODIUM CHLORIDE 0.9 % IV SOLN: INTRAVENOUS | Status: DC

## 2017-03-18 MED ORDER — FENTANYL CITRATE (PF) 100 MCG/2ML IJ SOLN
INTRAMUSCULAR | Status: AC | PRN
  Administered 2017-03-18: 50 ug via INTRAVENOUS

## 2017-03-18 MED ORDER — FENTANYL CITRATE (PF) 100 MCG/2ML IJ SOLN
INTRAMUSCULAR | Status: AC | PRN
  Administered 2017-03-18: 25 ug via INTRAVENOUS

## 2017-03-18 MED ORDER — MIDAZOLAM HCL 2 MG/2ML IJ SOLN
INTRAMUSCULAR | Status: AC
  Filled 2017-03-18: qty 6

## 2017-03-18 MED ORDER — ONDANSETRON HCL 4 MG/2ML IJ SOLN
INTRAMUSCULAR | Status: AC
  Administered 2017-03-18: 4 mg via INTRAVENOUS
  Filled 2017-03-18: qty 2

## 2017-03-18 MED ORDER — FENTANYL CITRATE (PF) 100 MCG/2ML IJ SOLN
INTRAMUSCULAR | Status: AC
  Filled 2017-03-18: qty 4

## 2017-03-18 MED ORDER — MIDAZOLAM HCL 2 MG/2ML IJ SOLN
INTRAMUSCULAR | Status: AC | PRN
  Administered 2017-03-18 (×2): 1 mg via INTRAVENOUS

## 2017-03-18 NOTE — Procedures (Signed)
Interventional Radiology Procedure Note  Procedure:  CT guided core biopsy of LUL lung mass  Complications: None  Estimated Blood Loss: < 10 mL  18 G core biopsy x 2 via 17 G needle at level of LUL lung mass.  Solid tissue obtained.  No PTX on post biopsy CT.  Venetia Night. Kathlene Cote, M.D Pager:  [phone]

## 2017-03-18 NOTE — H&P (Signed)
Chief Complaint: Lung Cancer  Referring Physician(s): Mohamed,Mohamed  Supervising Physician: Corrie Mckusick  Patient Status: [name] - Out-pt  History of Present Illness: Jacqueline Kidd is a 76 y.o. female with lung cancer and metastatic disease to the brain.  She presented with a large left upper lobe mass and left vocal cord paralysis.  PET scan done 03-17-2017 showed left upper lobe primary bronchogenic carcinoma, nodal metastasis within the chest and low neck, and deep left parotid hypermetabolic nodule could represent a metastatic node or a synchronous primary parotid neoplasm.  She is here today for CT guided biopsy of the lung mass and FNA of the parotid mass.  She is NPO. No blood thinners.  Past Medical History:  Diagnosis Date  . Chronic low back pain   . COPD (chronic obstructive pulmonary disease) ([place])   . GERD (gastroesophageal reflux disease)   . Hyperlipidemia   . Hypertension   . Lesion of left lung   . Osteoporosis     Past Surgical History:  Procedure Laterality Date  . ABDOMINAL HYSTERECTOMY    . TOTAL HIP ARTHROPLASTY     right    Allergies: Fosamax [alendronate]; Hydrocodone-acetaminophen; and Lisinopril-hydrochlorothiazide  Medications: Prior to Admission medications   Medication Sig Start Date End Date Taking? Authorizing Provider  acetaminophen (TYLENOL) 325 MG tablet Take 650 mg by mouth every 6 (six) hours as needed for mild pain or moderate pain.    Yes [provider]  aspirin EC 81 MG tablet Take 81 mg by mouth daily.   Yes [provider]  cholecalciferol (VITAMIN D) 1000 units tablet Take 1,000 Units by mouth daily.   Yes [provider]  Omega-3 Fatty Acids (FISH OIL) 1000 MG CAPS Take 1 capsule by mouth daily.   Yes [provider]  amLODipine (NORVASC) 2.5 MG tablet Take 1 tablet (2.5 mg total) by mouth daily. 03/16/17   [name], MD  LORazepam (ATIVAN) 0.5 MG tablet Take 1 tablet (0.5 mg  total) by mouth as needed for anxiety (30 min prior to MRI; may repeat after 30 min if needed). 03/11/17   Bruning, Ashlyn, PA-C  traMADol (ULTRAM) 50 MG tablet Take 1 tablet (50 mg total) by mouth every 8 (eight) hours as needed. 03/16/17   [name], MD     Family History  Problem Relation Age of Onset  . Cancer Mother        breast  . Cancer Father        esophageal  . Cancer Sister        breast    Social History   Social History  . Marital status: Married    Spouse name: N/A  . Number of children: N/A  . Years of education: N/A   Social History Main Topics  . Smoking status: Former Smoker    Packs/day: 1.00    Years: 57.00    Quit date: 02/16/2017  . Smokeless tobacco: Never Used  . Alcohol use No  . Drug use: No  . Sexual activity: No   Other Topics Concern  . None   Social History Narrative  . None      Review of Systems: A 12 point ROS discussed  Review of Systems  Constitutional: Positive for activity change, appetite change, fatigue and unexpected weight change.  HENT: Positive for voice change.   Respiratory: Positive for shortness of breath.   Cardiovascular: Negative.   Gastrointestinal: Negative.   Genitourinary: Negative.   Musculoskeletal:  Positive for back pain.  Skin: Negative.   Hematological: Negative.   Psychiatric/Behavioral: Negative.     Vital Signs: There were no vitals taken for this visit.  Physical Exam  Constitutional: She is oriented to person, place, and time. She appears well-developed.  HENT:  Head: Normocephalic and atraumatic.  Eyes: EOM are normal.  Neck: Normal range of motion.  Cardiovascular: Normal rate, regular rhythm and normal heart sounds.   Pulmonary/Chest: Effort normal and breath sounds normal. No respiratory distress. She has no wheezes.  Abdominal: Soft. She exhibits no distension. There is no tenderness.  Musculoskeletal: Normal range of motion.  Neurological: She is alert and oriented to person,  place, and time.  Skin: Skin is warm and dry.  Psychiatric: She has a normal mood and affect. Her behavior is normal. Judgment and thought content normal.    Mallampati Score:  MD Evaluation Airway: WNL Heart: WNL Abdomen: WNL Chest/ Lungs: WNL ASA  Classification: 3 Mallampati/Airway Score: Two  Imaging: Ct Chest W Contrast  Result Date: 02/23/2017 CLINICAL DATA:  Hoarseness for 1 month, COPD, ex-smoker EXAM: CT CHEST WITH CONTRAST TECHNIQUE: Multidetector CT imaging of the chest was performed during intravenous contrast administration. CONTRAST:  41mL ISOVUE-300 IOPAMIDOL (ISOVUE-300) INJECTION 61% COMPARISON:  Neck CT 02/13/2017 FINDINGS: Cardiovascular: Apparent RIGHT subclavian artery. No pericardial fluid. Coronary calcifications Mediastinum/Nodes: No axillary supraclavicular adenopathy. Regular adenopathy in the RIGHT lower paratracheal LEFT lower paratracheal and AP window measuring up to 2 cm short axis. Enlarged subcarinal lymph node with central calcification measures 2.2 cm short axis. No enlarged supraclavicular nodes. Lungs/Pleura: LEFT upper lobe mass measures 5.1 by 4.3 cm. Mass extends into the AP window. No lower lobe nodularity the LEFT.  The RIGHT lung is clear. Upper Abdomen: Limited view of the liver, kidneys, pancreas are unremarkable. Normal adrenal glands. Musculoskeletal: No aggressive osseous lesion. IMPRESSION: 1. Large LEFT upper lobe mass extends to the pleural surface of the mediastinum at the level of the AP window. Suspect mediastinal invasion at the level of the AP window 2. Irregular metastatic LEFT lower paratracheal and AP window adenopathy as well subcarinal adenopathy. 3. Findings consistent bronchogenic carcinoma with mediastinal metastasis. Recommend FDG PET scan for staging and biopsy planning. These results will be called to the ordering clinician or representative by the Radiologist Assistant, and communication documented in the PACS or zVision Dashboard.  Electronically Signed   By: Suzy Bouchard M.D.   On: 02/23/2017 17:03   Mr [name] MV Contrast  Result Date: 03/06/2017 CLINICAL DATA:  Lung cancer. Here for staging. History of hypertension. EXAM: MRI HEAD WITHOUT AND WITH CONTRAST TECHNIQUE: Multiplanar, multiecho pulse sequences of the brain and surrounding structures were obtained without and with intravenous contrast. CONTRAST:  44mL MULTIHANCE GADOBENATE DIMEGLUMINE 529 MG/ML IV SOLN COMPARISON:  PET- CT March 04, 2017 FINDINGS: BRAIN: Reduced diffusion associated with homogeneously enhancing 6 x 7 mm LEFT posterior temporal lobe metastasis. Cystic 8 x 8 mm LEFT posterior cerebellar metastasis with surrounding vasogenic edema. No reduced diffusion to suggest acute ischemia. No susceptibility artifact to suggest hemorrhage. Moderate moderate parenchymal brain volume loss, no hydrocephalus. Patchy to confluent supratentorial and pontine white matter FLAIR T2 hyperintensities. Prominent basal ganglia perivascular spaces associated with chronic small vessel ischemic disease. Old LEFT basal ganglia and LEFT thalamus lacunar infarcts. No midline shift or mass effect. No abnormal extra-axial fluid collections nor abnormal extra-axial enhancement. VASCULAR: Major intracranial vascular flow voids present at skull base ; dolichoectasia is seen with chronic hypertension. SKULL AND  UPPER CERVICAL SPINE: No abnormal sellar expansion. No suspicious calvarial bone marrow signal. Craniocervical junction maintained. SINUSES/ORBITS: The mastoid air-cells and included paranasal sinuses are well-aerated. The included ocular globes and orbital contents are non-suspicious. OTHER: 2 hypoenhancing LEFT parotid nodules measuring to 12 mm 15 mm. IMPRESSION: 1. Two subcentimeter intracranial metastasis within LEFT temporal lobe and LEFT cerebellum. 2. Moderate parenchymal brain volume loss. 3. Moderate to severe chronic small vessel ischemic disease. Old LEFT basal ganglia and  thalamus lacunar infarcts. 4. LEFT parotid hypoenhancing nodules: Nodal metastatic disease versus primary parotid tumors. These results will be called to the ordering clinician or representative by the Radiologist Assistant, and communication documented in the PACS or zVision Dashboard. Electronically Signed   By: Elon Alas M.D.   On: 03/06/2017 03:34   Nm Pet Image Initial (pi) Skull Base To Thigh  Result Date: Nov 03, 202018 CLINICAL DATA:  Initial treatment strategy for staging of left upper lobe lung mass. EXAM: NUCLEAR MEDICINE PET SKULL BASE TO THIGH TECHNIQUE: 9.9 mCi F-18 FDG was injected intravenously. Full-ring PET imaging was performed from the skull base to thigh after the radiotracer. CT data was obtained and used for attenuation correction and anatomic localization. FASTING BLOOD GLUCOSE:  Value: 117 mg/dl COMPARISON:  Chest CT 02/23/2017 FINDINGS: NECK: A deep left parotid nodule measures 1.3 x 0.8 cm and a S.U.V. max of 9.4 on image 21/series 4. Right true cord hypermetabolism is without well-defined CT correlate. This measures a S.U.V. max of 5.6 on image 36/series. Low left jugular hypermetabolic node measures 8 mm and a S.U.V. max of 6.5 on image 41/series 4. Bilateral carotid atherosclerosis. CHEST: Hypermetabolism corresponding to the previously described left upper lobe lung mass. Example at 5.1 x 4.2 cm and a S.U.V. max of 17.6. Hypermetabolic mediastinal nodes, including an index right paratracheal node which measures 11 mm and a S.U.V. max of 9.4 on image 53/series 4. Subcarinal adenopathy including at 1.5 cm and a S.U.V. max of 12.3. Chest findings otherwise deferred to recent diagnostic CT. Aortic and branch vessel atherosclerosis. Multivessel coronary artery atherosclerosis. ABDOMEN/PELVIS: No abdominopelvic nodal or parenchymal hypermetabolism identified. Normal adrenal glands. Small gallstones. Mild pancreatic atrophy. Tiny hiatal hernia. Retroaortic left renal vein. Degraded  evaluation of the pelvis, secondary to beam hardening artifact from right hip arthroplasty. Pelvic floor laxity. SKELETON: Hypermetabolism corresponding to a mild superior endplate compression deformity at L3. No underlying osseous lesion identified. Osteopenia. IMPRESSION: 1. Left upper lobe primary bronchogenic carcinoma. 2. Nodal metastasis within the chest and low neck, as detailed above. 3. Deep left parotid hypermetabolic nodule could represent a metastatic node or a synchronous primary parotid neoplasm. 4. Coronary artery atherosclerosis. Aortic Atherosclerosis (ICD10-I70.0). 5. Mild L3 compression deformity, likely due to osteopenia. 6. Probable physiologic right sided vocal cord hypermetabolism. This could be re-evaluated at followup or correlated with direct visualization. 7. Cholelithiasis. Electronically Signed   By: Abigail Miyamoto M.D.   On: 0Nov 03, 202018 15:18    Labs:  CBC:  Recent Labs  07/04/16 1211 02/19/17 1314  WBC 8.8 8.3  HGB 14.8 14.2  HCT 43.9 42.4  PLT 242 239    COAGS: No results for input(s): INR, APTT in the last 8760 hours.  BMP:  Recent Labs  03/21/16 1518 07/04/16 1211 02/19/17 1314  NA 142 143 140  K 4.3 4.1 4.1  CL 102 101  --   CO2 24 25 26   GLUCOSE 88 97 90  BUN 11 7* 8.3  CALCIUM 9.2 9.8 9.7  CREATININE 0.83 0.75  0.8  GFRNONAA 69 78  --   GFRAA 79 90  --     LIVER FUNCTION TESTS:  Recent Labs  03/21/16 1518 07/04/16 1211 02/19/17 1314  BILITOT 0.6 0.5 0.69  AST 23 38 47*  ALT 15 27 34  ALKPHOS 124* 109 110  PROT 7.3 7.4 8.1  ALBUMIN 4.1 4.0 3.6    TUMOR MARKERS: No results for input(s): AFPTM, CEA, CA199, CHROMGRNA in the last 8760 hours.  Assessment and Plan:  Left upper lobe primary bronchogenic carcinoma, nodal metastasis within the chest and low neck, and deep left parotid hypermetabolic nodule could represent a metastatic node or a synchronous primary parotid neoplasm.  Will proceed with CT guided left lung biopsy and [place]  FNA biopsy of the left parotid mass.  Risks and benefits of the lung biopsy were discussed with the patient including, but not limited to bleeding, hemoptysis, respiratory failure requiring intubation, infection, pneumothorax requiring chest tube placement, stroke from air embolism or even death.  Risks and benefits of the parotid biopsy were discussed with the patient including, but not limited to bleeding, infection, damage to adjacent structures or low yield requiring additional tests.  All of the patient's questions were answered, patient is agreeable to proceed. Consent signed and in chart.  Thank you for this interesting consult.  I greatly enjoyed meeting Jacqueline Kidd and look forward to participating in their care.  A copy of this report was sent to the requesting provider on this date.  Electronically Signed: Murrell Redden, PA-C 03/18/2017, 10:20 AM   I spent a total of 30 Minutes   in face to face in clinical consultation, greater than 50% of which was counseling/coordinating care for lung and parotid biopsy.

## 2017-03-18 NOTE — Procedures (Signed)
Interventional Radiology Procedure Note  Procedure: US guided fine needle aspiration of left parotid mass  Complications: None  Estimated Blood Loss: < 10 mL  Findings:  1.3 cm left parotid mass.  22G FNA x 3 and 25 G FNA x 1 for cytology.  Venetia Night. Kathlene Cote, M.D Pager:  [phone]

## 2017-03-18 NOTE — Discharge Instructions (Signed)
Needle Biopsy of the Lung, Care After This sheet gives you information about how to care for yourself after your procedure. Your health care provider may also give you more specific instructions. If you have problems or questions, contact your health care provider. What can I expect after the procedure? After the procedure, it is common to have:  Soreness, pain, and tenderness where a tissue sample was taken (biopsy site).  A cough.  A sore throat.  Follow these instructions at home: Biopsy site care  Follow instructions from your health care provider about when to remove the bandage that was placed on the biopsy site.  Keep the bandage dry until it has been removed.  Check your biopsy site every day for signs of infection. Check for: ? More redness, swelling, or pain. ? More fluid or blood. ? Warmth to the touch. ? Pus or a bad smell. General instructions  Rest as directed by your health care provider. Ask your health care provider what activities are safe for you.  Do not take baths, swim, or use a hot tub until your health care provider approves.  Take over-the-counter and prescription medicines only as told by your health care provider.  If you have airplane travel scheduled, talk with your health care provider about when it is safe for you to travel by airplane.  It is up to you to get the results of your procedure. Ask your health care provider, or the department that is doing the procedure, when your results will be ready.  Keep all follow-up visits as told by your health care provider. This is important. Contact a health care provider if:  You have more redness, swelling, or pain around your biopsy site.  You have more fluid or blood coming from your biopsy site.  Your biopsy site feels warm to the touch.  You have pus or a bad smell coming from your biopsy site.  You have a fever.  You have pain that does not get better with medicine. Get help right away  if:  You have problems breathing.  You have chest pain.  You cough up blood.  You faint.  You have a fast heart rate. Summary  After a needle biopsy of the lung, it is common to have a cough, a sore throat, or soreness, pain, and tenderness where a tissue sample was taken (biopsy site).  You should check your biopsy area every day for signs of infection, including pus or a bad smell, warmth, more fluid or blood, or more redness, swelling, or pain.  You should not take baths, swim, or use a hot tub until your health care provider approves.  It is up to you to get the results of your procedure. Ask your health care provider, or the department that is doing the procedure, when your results will be ready. This information is not intended to replace advice given to you by your health care provider. Make sure you discuss any questions you have with your health care provider. Document Released: 04/20/2007 Document Revised: 05/14/2016 Document Reviewed: 05/14/2016 Elsevier Interactive Patient Education  2017 [employer] Needle Biopsy, Care After These instructions give you information about caring for yourself after your procedure. Your doctor may also give you more specific instructions. Call your doctor if you have any problems or questions after your procedure. Follow these instructions at home:  Rest as told by your doctor.  Take medicines only as told by your doctor.  There are many different ways to  close and cover the biopsy site, including stitches (sutures), skin glue, and adhesive strips. Follow instructions from your doctor about: ? How to take care of your biopsy site. ? When and how you should change your bandage (dressing). ? When you should remove your dressing. ? Removing whatever was used to close your biopsy site.  Check your biopsy site every day for signs of infection. Watch for: ? Redness, swelling, or pain. ? Fluid, blood, or pus. Contact a doctor if:  You  have a fever.  You have redness, swelling, or pain at the biopsy site, and it lasts longer than a few days.  You have fluid, blood, or pus coming from the biopsy site.  You feel sick to your stomach (nauseous).  You throw up (vomit). Get help right away if:  You are short of breath.  You have trouble breathing.  Your chest hurts.  You feel dizzy or you pass out (faint).  You have bleeding that does not stop with pressure or a bandage.  You cough up blood.  Your belly (abdomen) hurts. This information is not intended to replace advice given to you by your health care provider. Make sure you discuss any questions you have with your health care provider. Document Released: 06/05/2008 Document Revised: 11/29/2015 Document Reviewed: 06/19/2014 Elsevier Interactive Patient Education  2018 [place]. Moderate Conscious Sedation, Adult, Care After These instructions provide you with information about caring for yourself after your procedure. Your health care provider may also give you more specific instructions. Your treatment has been planned according to current medical practices, but problems sometimes occur. Call your health care provider if you have any problems or questions after your procedure. What can I expect after the procedure? After your procedure, it is common:  To feel sleepy for several hours.  To feel clumsy and have poor balance for several hours.  To have poor judgment for several hours.  To vomit if you eat too soon.  Follow these instructions at home: For at least 24 hours after the procedure:   Do not: ? Participate in activities where you could fall or become injured. ? Drive. ? Use heavy machinery. ? Drink alcohol. ? Take sleeping pills or medicines that cause drowsiness. ? Make important decisions or sign legal documents. ? Take care of children on your own.  Rest. Eating and drinking  Follow the diet recommended by your health care  provider.  If you vomit: ? Drink water, juice, or soup when you can drink without vomiting. ? Make sure you have little or no nausea before eating solid foods. General instructions  Have a responsible adult stay with you until you are awake and alert.  Take over-the-counter and prescription medicines only as told by your health care provider.  If you smoke, do not smoke without supervision.  Keep all follow-up visits as told by your health care provider. This is important. Contact a health care provider if:  You keep feeling nauseous or you keep vomiting.  You feel light-headed.  You develop a rash.  You have a fever. Get help right away if:  You have trouble breathing. This information is not intended to replace advice given to you by your health care provider. Make sure you discuss any questions you have with your health care provider. Document Released: 04/13/2013 Document Revised: 11/26/2015 Document Reviewed: 10/13/2015 Elsevier Interactive Patient Education  2018 [place]. Biopsy Discharge Instructions  The procedure you just had is called a biopsy.  You may  feel some discomfort after the local anesthetic wears off.  Your discomfort should improve over the next several days.  AFTER YOUR BIOPSY  Rest for the remainder of the day.  Avoid heavy lifting (more than 10 lb/4.5 kg).  If you have been given a general anesthetic or other medications to help you relax, you should not operate machinery, drive or make legal decisions for 24 hours after your procedure.  Additionally, someone must be available to drive you home.  Only take over-the-counter or prescription medicines for pain, discomfort, or fever as directed by your caregiver.  This can make bleeding worse.  You may resume your usual diet after the procedure.  Avoid alcoholic beverages for 24 hours after your procedure.  Keep the skin around your biopsy site clean and dry.  You may shower after 24 hours.   Cleanse and dry the biopsy site completely after you shower.  Avoid baths and swimming for 72 hours.  Complications are very uncommon after this procedure.  Go to the nearest Emergency Department or contact your caregiver if you develop any of the following symptoms:  Worsening pain  Bleeding  Swelling at the biopsy site  Light headedness or dizziness  Shortness of Breath  Fever or chills  Redness or increased pain or swelling at the biopsy site

## 2017-03-19 ENCOUNTER — Ambulatory Visit: Payer: Medicare Other

## 2017-03-19 ENCOUNTER — Encounter: Payer: Self-pay | Admitting: Internal Medicine

## 2017-03-19 ENCOUNTER — Other Ambulatory Visit (HOSPITAL_BASED_OUTPATIENT_CLINIC_OR_DEPARTMENT_OTHER): Payer: Medicare Other

## 2017-03-19 ENCOUNTER — Other Ambulatory Visit: Payer: Medicare Other

## 2017-03-19 ENCOUNTER — Encounter: Payer: Self-pay | Admitting: *Deleted

## 2017-03-19 DIAGNOSIS — C3412 Malignant neoplasm of upper lobe, left bronchus or lung: Secondary | ICD-10-CM

## 2017-03-19 DIAGNOSIS — R918 Other nonspecific abnormal finding of lung field: Secondary | ICD-10-CM | POA: Diagnosis not present

## 2017-03-19 DIAGNOSIS — R599 Enlarged lymph nodes, unspecified: Secondary | ICD-10-CM

## 2017-03-19 LAB — CBC WITH DIFFERENTIAL/PLATELET
BASO%: 0.6 % (ref 0.0–2.0)
BASOS ABS: 0 10*3/uL (ref 0.0–0.1)
EOS%: 1.8 % (ref 0.0–7.0)
Eosinophils Absolute: 0.2 10*3/uL (ref 0.0–0.5)
HEMATOCRIT: 40.9 % (ref 34.8–46.6)
HEMOGLOBIN: 13.7 g/dL (ref 11.6–15.9)
LYMPH#: 2.5 10*3/uL (ref 0.9–3.3)
LYMPH%: 30.3 % (ref 14.0–49.7)
MCH: 31.5 pg (ref 25.1–34.0)
MCHC: 33.5 g/dL (ref 31.5–36.0)
MCV: 94.1 fL (ref 79.5–101.0)
MONO#: 0.7 10*3/uL (ref 0.1–0.9)
MONO%: 8.7 % (ref 0.0–14.0)
NEUT#: 4.8 10*3/uL (ref 1.5–6.5)
NEUT%: 58.6 % (ref 38.4–76.8)
Platelets: 234 10*3/uL (ref 145–400)
RBC: 4.34 10*6/uL (ref 3.70–5.45)
RDW: 13.9 % (ref 11.2–14.5)
WBC: 8.3 10*3/uL (ref 3.9–10.3)

## 2017-03-19 LAB — COMPREHENSIVE METABOLIC PANEL
ALBUMIN: 3.6 g/dL (ref 3.5–5.0)
ALT: 18 U/L (ref 0–55)
AST: 26 U/L (ref 5–34)
Alkaline Phosphatase: 142 U/L (ref 40–150)
Anion Gap: 9 mEq/L (ref 3–11)
BUN: 9.8 mg/dL (ref 7.0–26.0)
CALCIUM: 9.8 mg/dL (ref 8.4–10.4)
CHLORIDE: 101 meq/L (ref 98–109)
CO2: 27 mEq/L (ref 22–29)
CREATININE: 0.8 mg/dL (ref 0.6–1.1)
EGFR: 70 mL/min/{1.73_m2} — ABNORMAL LOW (ref >90)
GLUCOSE: 101 mg/dL (ref 70–140)
POTASSIUM: 4.1 meq/L (ref 3.5–5.1)
Sodium: 138 mEq/L (ref 136–145)
Total Bilirubin: 0.63 mg/dL (ref 0.20–1.20)
Total Protein: 8.2 g/dL (ref 6.4–8.3)

## 2017-03-19 NOTE — Progress Notes (Signed)
Unfortunately there aren't any foundations offering copay assistance for pt's Dx and the type of ins she has so I contacted Jenny Reichmann in the radiation dept requesting she reach out to the pt to inform her of the [place] since pt is coming from [place] for treatment.

## 2017-03-20 ENCOUNTER — Ambulatory Visit: Payer: Medicare Other | Admitting: Radiation Oncology

## 2017-03-20 ENCOUNTER — Other Ambulatory Visit: Payer: Self-pay | Admitting: *Deleted

## 2017-03-20 ENCOUNTER — Encounter: Payer: Self-pay | Admitting: *Deleted

## 2017-03-20 ENCOUNTER — Other Ambulatory Visit: Payer: Medicare Other

## 2017-03-20 ENCOUNTER — Telehealth: Payer: Self-pay

## 2017-03-20 ENCOUNTER — Ambulatory Visit: Payer: Medicare Other

## 2017-03-20 DIAGNOSIS — C3412 Malignant neoplasm of upper lobe, left bronchus or lung: Secondary | ICD-10-CM

## 2017-03-20 MED ORDER — PROCHLORPERAZINE MALEATE 10 MG PO TABS: 10.0000 mg | ORAL_TABLET | Freq: Four times a day (QID) | ORAL | 1 refills | Status: DC | PRN

## 2017-03-20 NOTE — Telephone Encounter (Signed)
alleta in chemo education called that no antinausea meds are ordered and pt starts taxol/carbo on Monday. S/w Dr Julien Nordmann and ordered compazine.  S/w pt and let her know

## 2017-03-20 NOTE — Progress Notes (Signed)
Oncology Nurse Navigator Documentation  Oncology Nurse Navigator Flowsheets 03/20/2017  Navigator Location [facility]  Navigator Encounter Type Other/I received a call from pathology. Tissue DX NSCLC squamous cell.  I updated Dr. Julien Nordmann. He requested PDL 1 to be sent. I updated pathology dept to send.   Treatment Phase Treatment  Barriers/Navigation Needs Coordination of Care  Interventions Coordination of Care  Coordination of Care Other  Acuity Level 2  Time Spent with Patient 15

## 2017-03-23 ENCOUNTER — Encounter: Payer: Self-pay | Admitting: Internal Medicine

## 2017-03-23 ENCOUNTER — Other Ambulatory Visit (HOSPITAL_BASED_OUTPATIENT_CLINIC_OR_DEPARTMENT_OTHER): Payer: Medicare Other

## 2017-03-23 ENCOUNTER — Other Ambulatory Visit: Payer: Medicare Other

## 2017-03-23 ENCOUNTER — Ambulatory Visit: Payer: Medicare Other

## 2017-03-23 ENCOUNTER — Ambulatory Visit (HOSPITAL_BASED_OUTPATIENT_CLINIC_OR_DEPARTMENT_OTHER): Payer: Medicare Other | Admitting: Internal Medicine

## 2017-03-23 ENCOUNTER — Telehealth: Payer: Self-pay | Admitting: Internal Medicine

## 2017-03-23 ENCOUNTER — Ambulatory Visit
Admission: RE | Admit: 2017-03-23 | Discharge: 2017-03-23 | Disposition: A | Discharge status: Home / Self Care | Payer: Medicare Other | Source: Ambulatory Visit | Attending: Radiation Oncology | Admitting: Radiation Oncology

## 2017-03-23 VITALS — BP 146/69 | HR 99 | Temp 98.5°F | Resp 19 | Ht 61.5 in | Wt 165.5 lb

## 2017-03-23 DIAGNOSIS — Z510 Encounter for antineoplastic radiation therapy: Secondary | ICD-10-CM | POA: Diagnosis not present

## 2017-03-23 DIAGNOSIS — Z7982 Long term (current) use of aspirin: Secondary | ICD-10-CM | POA: Diagnosis not present

## 2017-03-23 DIAGNOSIS — Z96641 Presence of right artificial hip joint: Secondary | ICD-10-CM | POA: Diagnosis not present

## 2017-03-23 DIAGNOSIS — I10 Essential (primary) hypertension: Secondary | ICD-10-CM | POA: Diagnosis not present

## 2017-03-23 DIAGNOSIS — Z803 Family history of malignant neoplasm of breast: Secondary | ICD-10-CM | POA: Diagnosis not present

## 2017-03-23 DIAGNOSIS — Z87891 Personal history of nicotine dependence: Secondary | ICD-10-CM | POA: Diagnosis not present

## 2017-03-23 DIAGNOSIS — C3412 Malignant neoplasm of upper lobe, left bronchus or lung: Secondary | ICD-10-CM

## 2017-03-23 DIAGNOSIS — J449 Chronic obstructive pulmonary disease, unspecified: Secondary | ICD-10-CM | POA: Diagnosis not present

## 2017-03-23 DIAGNOSIS — D219 Benign neoplasm of connective and other soft tissue, unspecified: Secondary | ICD-10-CM | POA: Diagnosis not present

## 2017-03-23 DIAGNOSIS — K219 Gastro-esophageal reflux disease without esophagitis: Secondary | ICD-10-CM | POA: Diagnosis not present

## 2017-03-23 DIAGNOSIS — Z800 Family history of malignant neoplasm of digestive organs: Secondary | ICD-10-CM | POA: Diagnosis not present

## 2017-03-23 DIAGNOSIS — C3492 Malignant neoplasm of unspecified part of left bronchus or lung: Secondary | ICD-10-CM

## 2017-03-23 DIAGNOSIS — Z7189 Other specified counseling: Secondary | ICD-10-CM | POA: Insufficient documentation

## 2017-03-23 DIAGNOSIS — Z5111 Encounter for antineoplastic chemotherapy: Secondary | ICD-10-CM

## 2017-03-23 DIAGNOSIS — Z79899 Other long term (current) drug therapy: Secondary | ICD-10-CM | POA: Diagnosis not present

## 2017-03-23 DIAGNOSIS — G8929 Other chronic pain: Secondary | ICD-10-CM | POA: Diagnosis not present

## 2017-03-23 DIAGNOSIS — C771 Secondary and unspecified malignant neoplasm of intrathoracic lymph nodes: Secondary | ICD-10-CM | POA: Diagnosis not present

## 2017-03-23 DIAGNOSIS — E785 Hyperlipidemia, unspecified: Secondary | ICD-10-CM | POA: Diagnosis not present

## 2017-03-23 DIAGNOSIS — C7931 Secondary malignant neoplasm of brain: Secondary | ICD-10-CM | POA: Diagnosis not present

## 2017-03-23 DIAGNOSIS — M545 Low back pain: Secondary | ICD-10-CM | POA: Diagnosis not present

## 2017-03-23 DIAGNOSIS — M810 Age-related osteoporosis without current pathological fracture: Secondary | ICD-10-CM | POA: Diagnosis not present

## 2017-03-23 LAB — COMPREHENSIVE METABOLIC PANEL
ALBUMIN: 3.5 g/dL (ref 3.5–5.0)
ALK PHOS: 137 U/L (ref 40–150)
ALT: 27 U/L (ref 0–55)
AST: 37 U/L — ABNORMAL HIGH (ref 5–34)
Anion Gap: 10 mEq/L (ref 3–11)
BILIRUBIN TOTAL: 0.66 mg/dL (ref 0.20–1.20)
BUN: 8.5 mg/dL (ref 7.0–26.0)
CO2: 23 meq/L (ref 22–29)
CREATININE: 0.8 mg/dL (ref 0.6–1.1)
Calcium: 9.9 mg/dL (ref 8.4–10.4)
Chloride: 104 mEq/L (ref 98–109)
EGFR: 69 mL/min/{1.73_m2} — AB (ref >90)
GLUCOSE: 166 mg/dL — AB (ref 70–140)
Potassium: 3.5 mEq/L (ref 3.5–5.1)
SODIUM: 138 meq/L (ref 136–145)
Total Protein: 8.1 g/dL (ref 6.4–8.3)

## 2017-03-23 LAB — CBC WITH DIFFERENTIAL/PLATELET
BASO%: 0.6 % (ref 0.0–2.0)
Basophils Absolute: 0 10*3/uL (ref 0.0–0.1)
EOS ABS: 0.1 10*3/uL (ref 0.0–0.5)
EOS%: 1.1 % (ref 0.0–7.0)
HCT: 43 % (ref 34.8–46.6)
HEMOGLOBIN: 14.7 g/dL (ref 11.6–15.9)
LYMPH%: 32.2 % (ref 14.0–49.7)
MCH: 31.7 pg (ref 25.1–34.0)
MCHC: 34.2 g/dL (ref 31.5–36.0)
MCV: 92.7 fL (ref 79.5–101.0)
MONO#: 0.8 10*3/uL (ref 0.1–0.9)
MONO%: 9.8 % (ref 0.0–14.0)
NEUT%: 56.3 % (ref 38.4–76.8)
NEUTROS ABS: 4.3 10*3/uL (ref 1.5–6.5)
PLATELETS: 222 10*3/uL (ref 145–400)
RBC: 4.63 10*6/uL (ref 3.70–5.45)
RDW: 13.7 % (ref 11.2–14.5)
WBC: 7.7 10*3/uL (ref 3.9–10.3)
lymph#: 2.5 10*3/uL (ref 0.9–3.3)

## 2017-03-23 NOTE — Telephone Encounter (Signed)
Gave avs and calendar for October  °

## 2017-03-23 NOTE — Progress Notes (Signed)
[place] [phone]   Fax:([phone]) [place] [phone]  DIAGNOSIS: stage IV(T2b, N3, M1c)  lung cancer pending further staging workup and tissue diagnosis. The patient presented with large left upper lobe lung mass in addition to left hilar and mediastinal lymphadenopathy and left true vocal cord paralysis.  PRIOR THERAPY: None  CURRENT THERAPY:  1) palliative radiotherapy to the large left upper lobe lung mass as well as metastatic brain lesions. 2) systemic chemotherapy with carboplatin for AUC of 5 and paclitaxel 175 MG/M2 every 3 weeks. First dose on 04/15/2017.  INTERVAL HISTORY: Jacqueline Kidd 76 y.o. female returns to the clinic today for follow-up visit accompanied by her husband. The patient is feeling fine today with no specific complaints except for the shortness breath at baseline and increased with exertion and mild cough with no hemoptysis. She denied having any recent weight loss or night sweats. She has no nausea, vomiting, diarrhea or constipation. She denied having any headache or visual changes. She underwent several studies recently including MRI of the brain that showed questionable brain metastasis. The patient also underwent ultrasound-guided biopsy of the left parotid gland and the final pathology was consistent with Warthin's tumor. The patient also ha CT-guided core biopsy of the left upper lobe lung mass and the final pathology ([id number]) was consistent with squamous cell carcinoma.   MEDICAL HISTORY: Past Medical History:  Diagnosis Date  . Chronic low back pain   . COPD (chronic obstructive pulmonary disease) ([place])   . GERD (gastroesophageal reflux disease)   . Hyperlipidemia   . Hypertension   . Lesion of left lung   . Osteoporosis     ALLERGIES:  is allergic to fosamax [alendronate]; hydrocodone-acetaminophen; and  lisinopril-hydrochlorothiazide.  MEDICATIONS:  Current Outpatient Prescriptions  Medication Sig Dispense Refill  . acetaminophen (TYLENOL) 325 MG tablet Take 650 mg by mouth every 6 (six) hours as needed for mild pain or moderate pain.     Marland Kitchen amLODipine (NORVASC) 2.5 MG tablet Take 1 tablet (2.5 mg total) by mouth daily. 90 tablet 1  . aspirin EC 81 MG tablet Take 81 mg by mouth daily.    . cholecalciferol (VITAMIN D) 1000 units tablet Take 1,000 Units by mouth daily.    Marland Kitchen LORazepam (ATIVAN) 0.5 MG tablet Take 1 tablet (0.5 mg total) by mouth as needed for anxiety (30 min prior to MRI; may repeat after 30 min if needed). 2 tablet 0  . Omega-3 Fatty Acids (FISH OIL) 1000 MG CAPS Take 1 capsule by mouth daily.    . prochlorperazine (COMPAZINE) 10 MG tablet Take 1 tablet (10 mg total) by mouth every 6 (six) hours as needed for nausea or vomiting. 30 tablet 1  . traMADol (ULTRAM) 50 MG tablet Take 1 tablet (50 mg total) by mouth every 8 (eight) hours as needed. 15 tablet 0   No current facility-administered medications for this visit.     SURGICAL HISTORY:  Past Surgical History:  Procedure Laterality Date  . ABDOMINAL HYSTERECTOMY    . TOTAL HIP ARTHROPLASTY     right    REVIEW OF SYSTEMS:  Constitutional: positive for fatigue Eyes: negative Ears, nose, mouth, throat, and face: negative Respiratory: positive for cough and dyspnea on exertion Cardiovascular: negative Gastrointestinal: negative Genitourinary:negative Integument/breast: negative Hematologic/lymphatic: negative Musculoskeletal:positive for back pain Neurological: negative Behavioral/Psych: negative Endocrine: negative Allergic/Immunologic: negative   PHYSICAL EXAMINATION: General  appearance: alert, cooperative, fatigued and no distress Head: Normocephalic, without obvious abnormality, atraumatic Neck: no adenopathy, no JVD, supple, symmetrical, trachea midline and thyroid not enlarged, symmetric, no  tenderness/mass/nodules Lymph nodes: Cervical, supraclavicular, and axillary nodes normal. Resp: wheezes bilaterally Back: symmetric, no curvature. ROM normal. No CVA tenderness. Cardio: regular rate and rhythm, S1, S2 normal, no murmur, click, rub or gallop GI: soft, non-tender; bowel sounds normal; no masses,  no organomegaly Extremities: extremities normal, atraumatic, no cyanosis or edema Neurologic: Alert and oriented X 3, normal strength and tone. Normal symmetric reflexes. Normal coordination and gait  ECOG PERFORMANCE STATUS: 1 - Symptomatic but completely ambulatory  Blood pressure (!) 146/69, pulse 99, temperature 98.5 F (36.9 C), temperature source Oral, resp. rate 19, height 5' 1.5" (1.562 m), weight 165 lb 8 oz (75.1 kg), SpO2 97 %.  LABORATORY DATA: Lab Results  Component Value Date   WBC 7.7 03/23/2017   HGB 14.7 03/23/2017   HCT 43.0 03/23/2017   MCV 92.7 03/23/2017   PLT 222 03/23/2017      Chemistry      Component Value Date/Time   NA 138 03/19/2017 1059   K 4.1 03/19/2017 1059   CL 101 07/04/2016 1211   CO2 27 03/19/2017 1059   BUN 9.8 03/19/2017 1059   CREATININE 0.8 03/19/2017 1059      Component Value Date/Time   CALCIUM 9.8 03/19/2017 1059   ALKPHOS 142 03/19/2017 1059   AST 26 03/19/2017 1059   ALT 18 03/19/2017 1059   BILITOT 0.63 03/19/2017 1059       RADIOGRAPHIC STUDIES: Ct Chest W Contrast  Result Date: 02/23/2017 CLINICAL DATA:  Hoarseness for 1 month, COPD, ex-smoker EXAM: CT CHEST WITH CONTRAST TECHNIQUE: Multidetector CT imaging of the chest was performed during intravenous contrast administration. CONTRAST:  61mL ISOVUE-300 IOPAMIDOL (ISOVUE-300) INJECTION 61% COMPARISON:  Neck CT 02/13/2017 FINDINGS: Cardiovascular: Apparent RIGHT subclavian artery. No pericardial fluid. Coronary calcifications Mediastinum/Nodes: No axillary supraclavicular adenopathy. Regular adenopathy in the RIGHT lower paratracheal LEFT lower paratracheal and AP  window measuring up to 2 cm short axis. Enlarged subcarinal lymph node with central calcification measures 2.2 cm short axis. No enlarged supraclavicular nodes. Lungs/Pleura: LEFT upper lobe mass measures 5.1 by 4.3 cm. Mass extends into the AP window. No lower lobe nodularity the LEFT.  The RIGHT lung is clear. Upper Abdomen: Limited view of the liver, kidneys, pancreas are unremarkable. Normal adrenal glands. Musculoskeletal: No aggressive osseous lesion. IMPRESSION: 1. Large LEFT upper lobe mass extends to the pleural surface of the mediastinum at the level of the AP window. Suspect mediastinal invasion at the level of the AP window 2. Irregular metastatic LEFT lower paratracheal and AP window adenopathy as well subcarinal adenopathy. 3. Findings consistent bronchogenic carcinoma with mediastinal metastasis. Recommend FDG PET scan for staging and biopsy planning. These results will be called to the ordering clinician or representative by the Radiologist Assistant, and communication documented in the PACS or zVision Dashboard. Electronically Signed   By: Suzy Bouchard M.D.   On: 02/23/2017 17:03   Mr [name] VW Contrast  Result Date: 03/06/2017 CLINICAL DATA:  Lung cancer. Here for staging. History of hypertension. EXAM: MRI HEAD WITHOUT AND WITH CONTRAST TECHNIQUE: Multiplanar, multiecho pulse sequences of the brain and surrounding structures were obtained without and with intravenous contrast. CONTRAST:  65mL MULTIHANCE GADOBENATE DIMEGLUMINE 529 MG/ML IV SOLN COMPARISON:  PET- CT March 04, 2017 FINDINGS: BRAIN: Reduced diffusion associated with homogeneously enhancing 6 x 7 mm LEFT posterior  temporal lobe metastasis. Cystic 8 x 8 mm LEFT posterior cerebellar metastasis with surrounding vasogenic edema. No reduced diffusion to suggest acute ischemia. No susceptibility artifact to suggest hemorrhage. Moderate moderate parenchymal brain volume loss, no hydrocephalus. Patchy to confluent supratentorial and  pontine white matter FLAIR T2 hyperintensities. Prominent basal ganglia perivascular spaces associated with chronic small vessel ischemic disease. Old LEFT basal ganglia and LEFT thalamus lacunar infarcts. No midline shift or mass effect. No abnormal extra-axial fluid collections nor abnormal extra-axial enhancement. VASCULAR: Major intracranial vascular flow voids present at skull base ; dolichoectasia is seen with chronic hypertension. SKULL AND UPPER CERVICAL SPINE: No abnormal sellar expansion. No suspicious calvarial bone marrow signal. Craniocervical junction maintained. SINUSES/ORBITS: The mastoid air-cells and included paranasal sinuses are well-aerated. The included ocular globes and orbital contents are non-suspicious. OTHER: 2 hypoenhancing LEFT parotid nodules measuring to 12 mm 15 mm. IMPRESSION: 1. Two subcentimeter intracranial metastasis within LEFT temporal lobe and LEFT cerebellum. 2. Moderate parenchymal brain volume loss. 3. Moderate to severe chronic small vessel ischemic disease. Old LEFT basal ganglia and thalamus lacunar infarcts. 4. LEFT parotid hypoenhancing nodules: Nodal metastatic disease versus primary parotid tumors. These results will be called to the ordering clinician or representative by the Radiologist Assistant, and communication documented in the PACS or zVision Dashboard. Electronically Signed   By: Elon Alas M.D.   On: 03/06/2017 03:34   Nm Pet Image Initial (pi) Skull Base To Thigh  Result Date: Mar 18, 202018 CLINICAL DATA:  Initial treatment strategy for staging of left upper lobe lung mass. EXAM: NUCLEAR MEDICINE PET SKULL BASE TO THIGH TECHNIQUE: 9.9 mCi F-18 FDG was injected intravenously. Full-ring PET imaging was performed from the skull base to thigh after the radiotracer. CT data was obtained and used for attenuation correction and anatomic localization. FASTING BLOOD GLUCOSE:  Value: 117 mg/dl COMPARISON:  Chest CT 02/23/2017 FINDINGS: NECK: A deep left  parotid nodule measures 1.3 x 0.8 cm and a S.U.V. max of 9.4 on image 21/series 4. Right true cord hypermetabolism is without well-defined CT correlate. This measures a S.U.V. max of 5.6 on image 36/series. Low left jugular hypermetabolic node measures 8 mm and a S.U.V. max of 6.5 on image 41/series 4. Bilateral carotid atherosclerosis. CHEST: Hypermetabolism corresponding to the previously described left upper lobe lung mass. Example at 5.1 x 4.2 cm and a S.U.V. max of 17.6. Hypermetabolic mediastinal nodes, including an index right paratracheal node which measures 11 mm and a S.U.V. max of 9.4 on image 53/series 4. Subcarinal adenopathy including at 1.5 cm and a S.U.V. max of 12.3. Chest findings otherwise deferred to recent diagnostic CT. Aortic and branch vessel atherosclerosis. Multivessel coronary artery atherosclerosis. ABDOMEN/PELVIS: No abdominopelvic nodal or parenchymal hypermetabolism identified. Normal adrenal glands. Small gallstones. Mild pancreatic atrophy. Tiny hiatal hernia. Retroaortic left renal vein. Degraded evaluation of the pelvis, secondary to beam hardening artifact from right hip arthroplasty. Pelvic floor laxity. SKELETON: Hypermetabolism corresponding to a mild superior endplate compression deformity at L3. No underlying osseous lesion identified. Osteopenia. IMPRESSION: 1. Left upper lobe primary bronchogenic carcinoma. 2. Nodal metastasis within the chest and low neck, as detailed above. 3. Deep left parotid hypermetabolic nodule could represent a metastatic node or a synchronous primary parotid neoplasm. 4. Coronary artery atherosclerosis. Aortic Atherosclerosis (ICD10-I70.0). 5. Mild L3 compression deformity, likely due to osteopenia. 6. Probable physiologic right sided vocal cord hypermetabolism. This could be re-evaluated at followup or correlated with direct visualization. 7. Cholelithiasis. Electronically Signed   By: Adria Devon.D.  On: 04-23-2017 15:18   Ct  Biopsy  Result Date: 03/18/2017 CLINICAL DATA:  Large, 5 cm left upper lobe lung mass with nodal metastases by PET scan. The patient presents for CT-guided biopsy of the left upper lobe lung mass. EXAM: CT GUIDED CORE BIOPSY OF LEFT UPPER LOBE LUNG MASS ANESTHESIA/SEDATION: 2.0 mg IV Versed; 50 mcg IV Fentanyl Total Moderate Sedation Time:  30 minutes. The patient's level of consciousness and physiologic status were continuously monitored during the procedure by Radiology nursing. PROCEDURE: The procedure risks, benefits, and alternatives were explained to the patient. Questions regarding the procedure were encouraged and answered. The patient understands and consents to the procedure. A time-out was performed prior to initiating the procedure. The left anterior chest wall was prepped with chlorhexidine in a sterile fashion, and a sterile drape was applied covering the operative field. A sterile gown and sterile gloves were used for the procedure. Local anesthesia was provided with 1% Lidocaine. Under CT guidance, a 17 gauge needle was advanced into the anterior aspect of a left upper lobe lung mass. After confirming needle tip position, coaxial 18 gauge core biopsy samples were obtained. Two separate biopsy samples were submitted in formalin. The Biosentry device was utilized in placing a plug at the pleural entry site on completion of the biopsy. Postprocedural CT images were performed. COMPLICATIONS: None FINDINGS: Large and spiculated left upper lobe lung mass measures approximately 6 cm in greatest diameter. Solid tissue was obtained. Post biopsy imaging shows no evidence of pneumothorax. There is no evidence of visible surrounding hemorrhage and no hemoptysis was encountered. IMPRESSION: CT-guided core biopsy performed of an approximately 6 cm left upper lobe lung mass. Electronically Signed   By: Aletta Edouard M.D.   On: 03/18/2017 14:06   [place] Fna Salivary Gland/parotid Gland  Result Date:  03/18/2017 CLINICAL DATA:  Left upper lobe lung mass. Staging PET scan also demonstrated a hypermetabolic parotid mass in the posterior aspect of the left parotid gland measuring approximately 1.3 cm. Biopsy of this lesion has also been requested. EXAM: ULTRASOUND GUIDED FINE-NEEDLE ASPIRATION BIOPSY OF LEFT PAROTID MASS MEDICATIONS: 1.0 mg IV Versed; 25 mcg IV Fentanyl Total Moderate Sedation Time: 14 minutes. The patient's level of consciousness and physiologic status were continuously monitored during the procedure by Radiology nursing. PROCEDURE: The procedure, risks, benefits, and alternatives were explained to the patient. Questions regarding the procedure were encouraged and answered. The patient understands and consents to the procedure. A time out was performed prior to initiating the procedure. Ultrasound was utilized to localize a left parotid mass. The left face was prepped with chlorhexidine in a sterile fashion, and a sterile drape was applied covering the operative field. A sterile gown and sterile gloves were used for the procedure. Local anesthesia was provided with 1% Lidocaine. A total of three 22 gauge and a single 25 gauge needle aspirate biopsy sample were obtained through a left parotid mass. Material was submitted for cytologic analysis. COMPLICATIONS: None. FINDINGS: Hypoechoic nodular lesion within the posterior and deep aspect of the left parotid gland measures approximately 1.3 cm in greatest diameter. Adjacent similar area of hypoechoic nodularity is seen measuring approximately 0.9 cm. IMPRESSION: Ultrasound-guided fine-needle aspiration performed of a 1.3 cm nodular lesion within the left parotid gland. Electronically Signed   By: Aletta Edouard M.D.   On: 03/18/2017 19:26    ASSESSMENT AND PLAN: This is a very pleasant 76 years old white female with stage IV (T2b, N3, M1 C) non-small cell lung cancer,  squamous cell carcinoma presented with large left upper lobe lung mass in  addition to mediastinal and supraclavicular lymphadenopathy as well as metastatic brain lesions diagnosed in September 2018. I had a lengthy discussion with the patient and her husband about her current disease stage, prognosis and treatment options. I recommended for the patient to continue with the palliative radiotherapy to the left upper lobe lung mass as well as the metastatic brain lesions under the care of Dr. [name]. The patient has incurable condition and the treatment will be off palliative nature. I discussed with her her treatment options including palliative care versus palliative systemic chemotherapy. She is interested in treatment. I discussed with her changing the care plan to follow her systemic chemotherapy with carboplatin for AUC of 5 and paclitaxel 175 MG/M2 every 3 weeks with Neulasta support. I will also send the tissue block for PDL 1 expression and over 50%, she will be considered for treatment with single agent immunotherapy. I discussed with the patient adverse effect of this treatment including but not limited to alopecia, myelosuppression, nausea and vomiting, liver or renal dysfunction. She is expected to start the first cycle of this treatment on 04/15/2017. I will arrange for the patient to come back for follow-up visit at that time. The patient and her husband agreed to the current plan. The patient voices understanding of current disease status and treatment options and is in agreement with the current care plan. All questions were answered. The patient knows to call the clinic with any problems, questions or concerns. We can certainly see the patient much sooner if necessary.  I spent 15 minutes counseling the patient face to face. The total time spent in the appointment was 25 minutes.  Disclaimer: This note was dictated with voice recognition software. Similar sounding words can inadvertently be transcribed and may not be corrected upon review.

## 2017-03-23 NOTE — Progress Notes (Signed)
DISCONTINUE ON PATHWAY REGIMEN - Non-Small Cell Lung     Administer weekly:     Paclitaxel      Carboplatin   **Always confirm dose/schedule in your pharmacy ordering system**    REASON: Other Reason PRIOR TREATMENT: GRE479: Carboplatin AUC=2 + Paclitaxel 45 mg/m2 Weekly During Radiation TREATMENT RESPONSE: Unable to Evaluate  START ON PATHWAY REGIMEN - Non-Small Cell Lung     A cycle is every 21 days:     Paclitaxel      Carboplatin   **Always confirm dose/schedule in your pharmacy ordering system**    Patient Characteristics: Stage IV Metastatic, Squamous, PS = 0, 1, First Line, PD-L1 Expression Positive 1-49% (TPS) / Negative / Not Tested / Not a Candidate for Immunotherapy/Awaiting Test Results AJCC T Category: T2b Current Disease Status: Distant Metastases AJCC N Category: N3 AJCC M Category: M1c AJCC 8 Stage Grouping: IVB Histology: Squamous Cell Line of therapy: First Line PD-L1 Expression Status: Awaiting Test Results Performance Status: PS = 0, 1 Would you be surprised if this patient died  in the next year<= I would NOT be surprised if this patient died in the next year Intent of Therapy: Non-Curative / Palliative Intent, Discussed with Patient

## 2017-03-24 ENCOUNTER — Ambulatory Visit
Admission: RE | Admit: 2017-03-24 | Discharge: 2017-03-24 | Disposition: A | Discharge status: Home / Self Care | Payer: Medicare Other | Source: Ambulatory Visit | Attending: Radiation Oncology | Admitting: Radiation Oncology

## 2017-03-24 ENCOUNTER — Ambulatory Visit: Payer: Medicare Other

## 2017-03-24 DIAGNOSIS — D219 Benign neoplasm of connective and other soft tissue, unspecified: Secondary | ICD-10-CM | POA: Diagnosis not present

## 2017-03-24 DIAGNOSIS — Z800 Family history of malignant neoplasm of digestive organs: Secondary | ICD-10-CM | POA: Diagnosis not present

## 2017-03-24 DIAGNOSIS — Z7982 Long term (current) use of aspirin: Secondary | ICD-10-CM | POA: Diagnosis not present

## 2017-03-24 DIAGNOSIS — Z87891 Personal history of nicotine dependence: Secondary | ICD-10-CM | POA: Diagnosis not present

## 2017-03-24 DIAGNOSIS — G8929 Other chronic pain: Secondary | ICD-10-CM | POA: Diagnosis not present

## 2017-03-24 DIAGNOSIS — Z79899 Other long term (current) drug therapy: Secondary | ICD-10-CM | POA: Diagnosis not present

## 2017-03-24 DIAGNOSIS — J449 Chronic obstructive pulmonary disease, unspecified: Secondary | ICD-10-CM | POA: Diagnosis not present

## 2017-03-24 DIAGNOSIS — Z803 Family history of malignant neoplasm of breast: Secondary | ICD-10-CM | POA: Diagnosis not present

## 2017-03-24 DIAGNOSIS — Z510 Encounter for antineoplastic radiation therapy: Secondary | ICD-10-CM | POA: Diagnosis not present

## 2017-03-24 DIAGNOSIS — K219 Gastro-esophageal reflux disease without esophagitis: Secondary | ICD-10-CM | POA: Diagnosis not present

## 2017-03-24 DIAGNOSIS — E785 Hyperlipidemia, unspecified: Secondary | ICD-10-CM | POA: Diagnosis not present

## 2017-03-24 DIAGNOSIS — I10 Essential (primary) hypertension: Secondary | ICD-10-CM | POA: Diagnosis not present

## 2017-03-24 DIAGNOSIS — C7931 Secondary malignant neoplasm of brain: Secondary | ICD-10-CM | POA: Diagnosis not present

## 2017-03-24 DIAGNOSIS — C3412 Malignant neoplasm of upper lobe, left bronchus or lung: Secondary | ICD-10-CM | POA: Diagnosis not present

## 2017-03-24 DIAGNOSIS — Z96641 Presence of right artificial hip joint: Secondary | ICD-10-CM | POA: Diagnosis not present

## 2017-03-24 DIAGNOSIS — C3490 Malignant neoplasm of unspecified part of unspecified bronchus or lung: Secondary | ICD-10-CM | POA: Diagnosis not present

## 2017-03-24 DIAGNOSIS — M810 Age-related osteoporosis without current pathological fracture: Secondary | ICD-10-CM | POA: Diagnosis not present

## 2017-03-24 DIAGNOSIS — M545 Low back pain: Secondary | ICD-10-CM | POA: Diagnosis not present

## 2017-03-24 DIAGNOSIS — C771 Secondary and unspecified malignant neoplasm of intrathoracic lymph nodes: Secondary | ICD-10-CM | POA: Diagnosis not present

## 2017-03-25 ENCOUNTER — Ambulatory Visit
Admission: RE | Admit: 2017-03-25 | Discharge: 2017-03-25 | Disposition: A | Discharge status: Home / Self Care | Payer: Medicare Other | Source: Ambulatory Visit | Attending: Radiation Oncology | Admitting: Radiation Oncology

## 2017-03-25 DIAGNOSIS — Z803 Family history of malignant neoplasm of breast: Secondary | ICD-10-CM | POA: Diagnosis not present

## 2017-03-25 DIAGNOSIS — Z510 Encounter for antineoplastic radiation therapy: Secondary | ICD-10-CM | POA: Diagnosis not present

## 2017-03-25 DIAGNOSIS — Z79899 Other long term (current) drug therapy: Secondary | ICD-10-CM | POA: Diagnosis not present

## 2017-03-25 DIAGNOSIS — M810 Age-related osteoporosis without current pathological fracture: Secondary | ICD-10-CM | POA: Diagnosis not present

## 2017-03-25 DIAGNOSIS — C3412 Malignant neoplasm of upper lobe, left bronchus or lung: Secondary | ICD-10-CM | POA: Diagnosis not present

## 2017-03-25 DIAGNOSIS — Z96641 Presence of right artificial hip joint: Secondary | ICD-10-CM | POA: Diagnosis not present

## 2017-03-25 DIAGNOSIS — Z800 Family history of malignant neoplasm of digestive organs: Secondary | ICD-10-CM | POA: Diagnosis not present

## 2017-03-25 DIAGNOSIS — G8929 Other chronic pain: Secondary | ICD-10-CM | POA: Diagnosis not present

## 2017-03-25 DIAGNOSIS — Z7982 Long term (current) use of aspirin: Secondary | ICD-10-CM | POA: Diagnosis not present

## 2017-03-25 DIAGNOSIS — E785 Hyperlipidemia, unspecified: Secondary | ICD-10-CM | POA: Diagnosis not present

## 2017-03-25 DIAGNOSIS — M545 Low back pain: Secondary | ICD-10-CM | POA: Diagnosis not present

## 2017-03-25 DIAGNOSIS — Z87891 Personal history of nicotine dependence: Secondary | ICD-10-CM | POA: Diagnosis not present

## 2017-03-25 DIAGNOSIS — K219 Gastro-esophageal reflux disease without esophagitis: Secondary | ICD-10-CM | POA: Diagnosis not present

## 2017-03-25 DIAGNOSIS — C771 Secondary and unspecified malignant neoplasm of intrathoracic lymph nodes: Secondary | ICD-10-CM | POA: Diagnosis not present

## 2017-03-25 DIAGNOSIS — J449 Chronic obstructive pulmonary disease, unspecified: Secondary | ICD-10-CM | POA: Diagnosis not present

## 2017-03-25 DIAGNOSIS — C7931 Secondary malignant neoplasm of brain: Secondary | ICD-10-CM | POA: Diagnosis not present

## 2017-03-25 DIAGNOSIS — D219 Benign neoplasm of connective and other soft tissue, unspecified: Secondary | ICD-10-CM | POA: Diagnosis not present

## 2017-03-25 DIAGNOSIS — I10 Essential (primary) hypertension: Secondary | ICD-10-CM | POA: Diagnosis not present

## 2017-03-26 ENCOUNTER — Ambulatory Visit: Payer: Medicare Other | Admitting: Radiation Oncology

## 2017-03-26 ENCOUNTER — Ambulatory Visit
Admission: RE | Admit: 2017-03-26 | Discharge: 2017-03-26 | Disposition: A | Discharge status: Home / Self Care | Payer: Medicare Other | Source: Ambulatory Visit | Attending: Radiation Oncology | Admitting: Radiation Oncology

## 2017-03-26 VITALS — BP 121/62 | HR 110 | Temp 97.8°F | Resp 16 | Wt 165.0 lb

## 2017-03-26 DIAGNOSIS — K219 Gastro-esophageal reflux disease without esophagitis: Secondary | ICD-10-CM | POA: Diagnosis not present

## 2017-03-26 DIAGNOSIS — M545 Low back pain: Secondary | ICD-10-CM | POA: Diagnosis not present

## 2017-03-26 DIAGNOSIS — I10 Essential (primary) hypertension: Secondary | ICD-10-CM | POA: Diagnosis not present

## 2017-03-26 DIAGNOSIS — Z87891 Personal history of nicotine dependence: Secondary | ICD-10-CM | POA: Diagnosis not present

## 2017-03-26 DIAGNOSIS — Z803 Family history of malignant neoplasm of breast: Secondary | ICD-10-CM | POA: Diagnosis not present

## 2017-03-26 DIAGNOSIS — Z7982 Long term (current) use of aspirin: Secondary | ICD-10-CM | POA: Diagnosis not present

## 2017-03-26 DIAGNOSIS — C7931 Secondary malignant neoplasm of brain: Secondary | ICD-10-CM

## 2017-03-26 DIAGNOSIS — C7949 Secondary malignant neoplasm of other parts of nervous system: Principal | ICD-10-CM

## 2017-03-26 DIAGNOSIS — C771 Secondary and unspecified malignant neoplasm of intrathoracic lymph nodes: Secondary | ICD-10-CM | POA: Diagnosis not present

## 2017-03-26 DIAGNOSIS — M810 Age-related osteoporosis without current pathological fracture: Secondary | ICD-10-CM | POA: Diagnosis not present

## 2017-03-26 DIAGNOSIS — Z96641 Presence of right artificial hip joint: Secondary | ICD-10-CM | POA: Diagnosis not present

## 2017-03-26 DIAGNOSIS — Z79899 Other long term (current) drug therapy: Secondary | ICD-10-CM | POA: Diagnosis not present

## 2017-03-26 DIAGNOSIS — Z800 Family history of malignant neoplasm of digestive organs: Secondary | ICD-10-CM | POA: Diagnosis not present

## 2017-03-26 DIAGNOSIS — E785 Hyperlipidemia, unspecified: Secondary | ICD-10-CM | POA: Diagnosis not present

## 2017-03-26 DIAGNOSIS — Z510 Encounter for antineoplastic radiation therapy: Secondary | ICD-10-CM | POA: Diagnosis not present

## 2017-03-26 DIAGNOSIS — C3412 Malignant neoplasm of upper lobe, left bronchus or lung: Secondary | ICD-10-CM | POA: Diagnosis not present

## 2017-03-26 DIAGNOSIS — J449 Chronic obstructive pulmonary disease, unspecified: Secondary | ICD-10-CM | POA: Diagnosis not present

## 2017-03-26 DIAGNOSIS — I639 Cerebral infarction, unspecified: Secondary | ICD-10-CM | POA: Diagnosis not present

## 2017-03-26 DIAGNOSIS — D219 Benign neoplasm of connective and other soft tissue, unspecified: Secondary | ICD-10-CM | POA: Diagnosis not present

## 2017-03-26 DIAGNOSIS — G8929 Other chronic pain: Secondary | ICD-10-CM | POA: Diagnosis not present

## 2017-03-26 MED ORDER — GADOBENATE DIMEGLUMINE 529 MG/ML IV SOLN
15.0000 mL | Freq: Once | INTRAVENOUS | Status: AC | PRN
  Administered 2017-03-26: 15 mL via INTRAVENOUS

## 2017-03-26 NOTE — Progress Notes (Signed)
  Radiation Oncology         [phone] ________________________________  Name: KHLOEY CHERN MRN: [id number]  Date: 03/26/2017  DOB: 1940-09-02  SIMULATION AND TREATMENT PLANNING NOTE    ICD-10-CM   1. Brain metastases (Ramirez-Perez) C79.31     DIAGNOSIS:  76 yo woman with probable stage III nsclc of the right upper lung with right neck nodal disease and 2 brain metastases  NARRATIVE:  The patient was brought to the [facility].  Identity was confirmed.  All relevant records and images related to the planned course of therapy were reviewed.  The patient freely provided informed written consent to proceed with treatment after reviewing the details related to the planned course of therapy. The consent form was witnessed and verified by the simulation staff. Intravenous access was established for contrast administration. Then, the patient was set-up in a stable reproducible supine position for radiation therapy.  A relocatable thermoplastic stereotactic head frame was fabricated for precise immobilization.  CT images were obtained.  Surface markings were placed.  The CT images were loaded into the planning software and fused with the patient's targeting MRI scan.  Then the target and avoidance structures were contoured.  Treatment planning then occurred.  The radiation prescription was entered and confirmed.  I have requested 3D planning  I have requested a DVH of the following structures: Brain stem, brain, left eye, right eye, lenses, optic chiasm, target volumes, uninvolved brain, and normal tissue.    SPECIAL TREATMENT PROCEDURE:  The planned course of therapy using radiation constitutes a special treatment procedure. Special care is required in the management of this patient for the following reasons. This treatment constitutes a Special Treatment Procedure for the following reason: High dose per fraction requiring special monitoring for increased toxicities of treatment including daily  imaging.  The special nature of the planned course of radiotherapy will require increased physician supervision and oversight to ensure patient's safety with optimal treatment outcomes.  PLAN:  The patient will receive 20 Gy in 1 fraction.  ________________________________  Sheral Apley [name], M.D.   This document serves as a record of services personally performed by Tyler Pita, MD. It was created on his behalf by Bethann Humble, a trained medical scribe. The creation of this record is based on the scribe's personal observations and the provider's statements to them. This document has been checked and approved by the attending provider.

## 2017-03-27 ENCOUNTER — Ambulatory Visit
Admission: RE | Admit: 2017-03-27 | Discharge: 2017-03-27 | Disposition: A | Discharge status: Home / Self Care | Payer: Medicare Other | Source: Ambulatory Visit | Attending: Radiation Oncology | Admitting: Radiation Oncology

## 2017-03-27 DIAGNOSIS — C7931 Secondary malignant neoplasm of brain: Secondary | ICD-10-CM | POA: Diagnosis not present

## 2017-03-27 DIAGNOSIS — Z96641 Presence of right artificial hip joint: Secondary | ICD-10-CM | POA: Diagnosis not present

## 2017-03-27 DIAGNOSIS — Z510 Encounter for antineoplastic radiation therapy: Secondary | ICD-10-CM | POA: Diagnosis not present

## 2017-03-27 DIAGNOSIS — Z800 Family history of malignant neoplasm of digestive organs: Secondary | ICD-10-CM | POA: Diagnosis not present

## 2017-03-27 DIAGNOSIS — Z7982 Long term (current) use of aspirin: Secondary | ICD-10-CM | POA: Diagnosis not present

## 2017-03-27 DIAGNOSIS — Z79899 Other long term (current) drug therapy: Secondary | ICD-10-CM | POA: Diagnosis not present

## 2017-03-27 DIAGNOSIS — K219 Gastro-esophageal reflux disease without esophagitis: Secondary | ICD-10-CM | POA: Diagnosis not present

## 2017-03-27 DIAGNOSIS — D219 Benign neoplasm of connective and other soft tissue, unspecified: Secondary | ICD-10-CM | POA: Diagnosis not present

## 2017-03-27 DIAGNOSIS — Z87891 Personal history of nicotine dependence: Secondary | ICD-10-CM | POA: Diagnosis not present

## 2017-03-27 DIAGNOSIS — C3412 Malignant neoplasm of upper lobe, left bronchus or lung: Secondary | ICD-10-CM | POA: Diagnosis not present

## 2017-03-27 DIAGNOSIS — M545 Low back pain: Secondary | ICD-10-CM | POA: Diagnosis not present

## 2017-03-27 DIAGNOSIS — M810 Age-related osteoporosis without current pathological fracture: Secondary | ICD-10-CM | POA: Diagnosis not present

## 2017-03-27 DIAGNOSIS — C771 Secondary and unspecified malignant neoplasm of intrathoracic lymph nodes: Secondary | ICD-10-CM | POA: Diagnosis not present

## 2017-03-27 DIAGNOSIS — I10 Essential (primary) hypertension: Secondary | ICD-10-CM | POA: Diagnosis not present

## 2017-03-27 DIAGNOSIS — G8929 Other chronic pain: Secondary | ICD-10-CM | POA: Diagnosis not present

## 2017-03-27 DIAGNOSIS — J449 Chronic obstructive pulmonary disease, unspecified: Secondary | ICD-10-CM | POA: Diagnosis not present

## 2017-03-27 DIAGNOSIS — Z803 Family history of malignant neoplasm of breast: Secondary | ICD-10-CM | POA: Diagnosis not present

## 2017-03-27 DIAGNOSIS — E785 Hyperlipidemia, unspecified: Secondary | ICD-10-CM | POA: Diagnosis not present

## 2017-03-28 ENCOUNTER — Ambulatory Visit: Payer: Medicare Other

## 2017-03-30 ENCOUNTER — Other Ambulatory Visit: Payer: Medicare Other

## 2017-03-30 ENCOUNTER — Ambulatory Visit: Payer: Medicare Other

## 2017-03-30 ENCOUNTER — Ambulatory Visit: Payer: Medicare Other | Admitting: Oncology

## 2017-03-30 ENCOUNTER — Encounter (HOSPITAL_COMMUNITY): Payer: Self-pay

## 2017-03-30 ENCOUNTER — Ambulatory Visit
Admission: RE | Admit: 2017-03-30 | Discharge: 2017-03-30 | Disposition: A | Discharge status: Home / Self Care | Payer: Medicare Other | Source: Ambulatory Visit | Attending: Radiation Oncology | Admitting: Radiation Oncology

## 2017-03-30 ENCOUNTER — Encounter: Payer: Self-pay | Admitting: *Deleted

## 2017-03-30 DIAGNOSIS — C7931 Secondary malignant neoplasm of brain: Secondary | ICD-10-CM | POA: Diagnosis not present

## 2017-03-30 DIAGNOSIS — C771 Secondary and unspecified malignant neoplasm of intrathoracic lymph nodes: Secondary | ICD-10-CM | POA: Diagnosis not present

## 2017-03-30 DIAGNOSIS — J449 Chronic obstructive pulmonary disease, unspecified: Secondary | ICD-10-CM | POA: Diagnosis not present

## 2017-03-30 DIAGNOSIS — Z800 Family history of malignant neoplasm of digestive organs: Secondary | ICD-10-CM | POA: Diagnosis not present

## 2017-03-30 DIAGNOSIS — Z96641 Presence of right artificial hip joint: Secondary | ICD-10-CM | POA: Diagnosis not present

## 2017-03-30 DIAGNOSIS — M810 Age-related osteoporosis without current pathological fracture: Secondary | ICD-10-CM | POA: Diagnosis not present

## 2017-03-30 DIAGNOSIS — M545 Low back pain: Secondary | ICD-10-CM | POA: Diagnosis not present

## 2017-03-30 DIAGNOSIS — Z7982 Long term (current) use of aspirin: Secondary | ICD-10-CM | POA: Diagnosis not present

## 2017-03-30 DIAGNOSIS — D219 Benign neoplasm of connective and other soft tissue, unspecified: Secondary | ICD-10-CM | POA: Diagnosis not present

## 2017-03-30 DIAGNOSIS — Z510 Encounter for antineoplastic radiation therapy: Secondary | ICD-10-CM | POA: Diagnosis not present

## 2017-03-30 DIAGNOSIS — E785 Hyperlipidemia, unspecified: Secondary | ICD-10-CM | POA: Diagnosis not present

## 2017-03-30 DIAGNOSIS — Z79899 Other long term (current) drug therapy: Secondary | ICD-10-CM | POA: Diagnosis not present

## 2017-03-30 DIAGNOSIS — G8929 Other chronic pain: Secondary | ICD-10-CM | POA: Diagnosis not present

## 2017-03-30 DIAGNOSIS — Z87891 Personal history of nicotine dependence: Secondary | ICD-10-CM | POA: Diagnosis not present

## 2017-03-30 DIAGNOSIS — K219 Gastro-esophageal reflux disease without esophagitis: Secondary | ICD-10-CM | POA: Diagnosis not present

## 2017-03-30 DIAGNOSIS — Z803 Family history of malignant neoplasm of breast: Secondary | ICD-10-CM | POA: Diagnosis not present

## 2017-03-30 DIAGNOSIS — C3412 Malignant neoplasm of upper lobe, left bronchus or lung: Secondary | ICD-10-CM | POA: Diagnosis not present

## 2017-03-30 DIAGNOSIS — I10 Essential (primary) hypertension: Secondary | ICD-10-CM | POA: Diagnosis not present

## 2017-03-30 NOTE — Progress Notes (Signed)
Oncology Nurse Navigator Documentation  Oncology Nurse Navigator Flowsheets 03/30/2017  Navigator Location [facility]  Navigator Encounter Type Other/PDL 1 results completed, Dr. Julien Nordmann updated.   Treatment Phase Treatment  Barriers/Navigation Needs Coordination of Care  Interventions Coordination of Care  Coordination of Care Other  Acuity Level 1  Time Spent with Patient 15

## 2017-03-30 NOTE — Progress Notes (Signed)
Late entry from 03/26/17 at 1324.   Has armband been applied?  Yes.    Does patient have an allergy to IV contrast dye?: No.   Has patient ever received premedication for IV contrast dye?: No.   Does patient take metformin?: No.  If patient does take metformin when was the last dose: n/a  Date of lab work: 03/23/17 BUN: 8.5 CR: 0.8  IV site: antecubital left, condition no redness  Has IV site been added to flowsheet?  Yes.    BP 121/62   Pulse (!) 110   Temp 97.8 F (36.6 C) (Oral)   Resp 16   Wt 165 lb (74.8 kg)   SpO2 96%   BMI 30.67 kg/m

## 2017-03-31 ENCOUNTER — Telehealth: Payer: Self-pay | Admitting: Radiation Oncology

## 2017-03-31 ENCOUNTER — Other Ambulatory Visit: Payer: Self-pay | Admitting: Urology

## 2017-03-31 ENCOUNTER — Telehealth: Payer: Self-pay | Admitting: Medical Oncology

## 2017-03-31 ENCOUNTER — Ambulatory Visit
Admission: RE | Admit: 2017-03-31 | Discharge: 2017-03-31 | Disposition: A | Discharge status: Home / Self Care | Payer: Medicare Other | Source: Ambulatory Visit | Attending: Radiation Oncology | Admitting: Radiation Oncology

## 2017-03-31 DIAGNOSIS — Z79899 Other long term (current) drug therapy: Secondary | ICD-10-CM | POA: Diagnosis not present

## 2017-03-31 DIAGNOSIS — E785 Hyperlipidemia, unspecified: Secondary | ICD-10-CM | POA: Diagnosis not present

## 2017-03-31 DIAGNOSIS — J449 Chronic obstructive pulmonary disease, unspecified: Secondary | ICD-10-CM | POA: Diagnosis not present

## 2017-03-31 DIAGNOSIS — G8929 Other chronic pain: Secondary | ICD-10-CM | POA: Diagnosis not present

## 2017-03-31 DIAGNOSIS — C7931 Secondary malignant neoplasm of brain: Secondary | ICD-10-CM

## 2017-03-31 DIAGNOSIS — Z96641 Presence of right artificial hip joint: Secondary | ICD-10-CM | POA: Diagnosis not present

## 2017-03-31 DIAGNOSIS — Z7982 Long term (current) use of aspirin: Secondary | ICD-10-CM | POA: Diagnosis not present

## 2017-03-31 DIAGNOSIS — Z87891 Personal history of nicotine dependence: Secondary | ICD-10-CM | POA: Diagnosis not present

## 2017-03-31 DIAGNOSIS — C771 Secondary and unspecified malignant neoplasm of intrathoracic lymph nodes: Secondary | ICD-10-CM | POA: Diagnosis not present

## 2017-03-31 DIAGNOSIS — D219 Benign neoplasm of connective and other soft tissue, unspecified: Secondary | ICD-10-CM | POA: Diagnosis not present

## 2017-03-31 DIAGNOSIS — K219 Gastro-esophageal reflux disease without esophagitis: Secondary | ICD-10-CM | POA: Diagnosis not present

## 2017-03-31 DIAGNOSIS — I10 Essential (primary) hypertension: Secondary | ICD-10-CM | POA: Diagnosis not present

## 2017-03-31 DIAGNOSIS — Z510 Encounter for antineoplastic radiation therapy: Secondary | ICD-10-CM | POA: Diagnosis not present

## 2017-03-31 DIAGNOSIS — M545 Low back pain: Secondary | ICD-10-CM | POA: Diagnosis not present

## 2017-03-31 DIAGNOSIS — Z800 Family history of malignant neoplasm of digestive organs: Secondary | ICD-10-CM | POA: Diagnosis not present

## 2017-03-31 DIAGNOSIS — M810 Age-related osteoporosis without current pathological fracture: Secondary | ICD-10-CM | POA: Diagnosis not present

## 2017-03-31 DIAGNOSIS — Z803 Family history of malignant neoplasm of breast: Secondary | ICD-10-CM | POA: Diagnosis not present

## 2017-03-31 DIAGNOSIS — C3412 Malignant neoplasm of upper lobe, left bronchus or lung: Secondary | ICD-10-CM | POA: Diagnosis not present

## 2017-03-31 MED ORDER — LORAZEPAM 0.5 MG PO TABS: 0.5000 mg | ORAL_TABLET | ORAL | 0 refills | Status: DC | PRN

## 2017-03-31 NOTE — Telephone Encounter (Signed)
Per Ashlyn Bruning's order call in Ativan 0.5 mg to [facility], [place].

## 2017-03-31 NOTE — Telephone Encounter (Signed)
-----   Message from Freeman Caldron, [place] sent at 03/31/2017 11:31 AM EDT ----- Regarding: RE: ativan refill request prior to procedure Sam, Please call Rx for Ativan to [facility] [place] and let patient know once this has been completed.  I have placed the order in EPIC. Thank you! -Ashlyn ----- Message ----- From: Ardeen Garland, RN Sent: 03/31/2017  10:42 AM To: Tyler Pita, MD, Freeman Caldron, PA-C, # Subject: ativan refill request prior to procedure       Pt request refill ativan for premed for srs

## 2017-03-31 NOTE — Telephone Encounter (Signed)
Request refill ativan for pre med for [facility].

## 2017-03-31 NOTE — Telephone Encounter (Signed)
Phoned patient's cell to inform her script has been called in. No answer. Voicemail not setup. Next, phoned patient's home and left message that script was waiting at [place], [place].

## 2017-04-01 ENCOUNTER — Ambulatory Visit
Admission: RE | Admit: 2017-04-01 | Discharge: 2017-04-01 | Disposition: A | Discharge status: Home / Self Care | Payer: Medicare Other | Source: Ambulatory Visit | Attending: Radiation Oncology | Admitting: Radiation Oncology

## 2017-04-01 DIAGNOSIS — K219 Gastro-esophageal reflux disease without esophagitis: Secondary | ICD-10-CM | POA: Diagnosis not present

## 2017-04-01 DIAGNOSIS — Z510 Encounter for antineoplastic radiation therapy: Secondary | ICD-10-CM | POA: Diagnosis not present

## 2017-04-01 DIAGNOSIS — G8929 Other chronic pain: Secondary | ICD-10-CM | POA: Diagnosis not present

## 2017-04-01 DIAGNOSIS — Z7982 Long term (current) use of aspirin: Secondary | ICD-10-CM | POA: Diagnosis not present

## 2017-04-01 DIAGNOSIS — J449 Chronic obstructive pulmonary disease, unspecified: Secondary | ICD-10-CM | POA: Diagnosis not present

## 2017-04-01 DIAGNOSIS — C771 Secondary and unspecified malignant neoplasm of intrathoracic lymph nodes: Secondary | ICD-10-CM | POA: Diagnosis not present

## 2017-04-01 DIAGNOSIS — C7931 Secondary malignant neoplasm of brain: Secondary | ICD-10-CM | POA: Diagnosis not present

## 2017-04-01 DIAGNOSIS — D219 Benign neoplasm of connective and other soft tissue, unspecified: Secondary | ICD-10-CM | POA: Diagnosis not present

## 2017-04-01 DIAGNOSIS — Z79899 Other long term (current) drug therapy: Secondary | ICD-10-CM | POA: Diagnosis not present

## 2017-04-01 DIAGNOSIS — M545 Low back pain: Secondary | ICD-10-CM | POA: Diagnosis not present

## 2017-04-01 DIAGNOSIS — Z87891 Personal history of nicotine dependence: Secondary | ICD-10-CM | POA: Diagnosis not present

## 2017-04-01 DIAGNOSIS — Z803 Family history of malignant neoplasm of breast: Secondary | ICD-10-CM | POA: Diagnosis not present

## 2017-04-01 DIAGNOSIS — M810 Age-related osteoporosis without current pathological fracture: Secondary | ICD-10-CM | POA: Diagnosis not present

## 2017-04-01 DIAGNOSIS — Z96641 Presence of right artificial hip joint: Secondary | ICD-10-CM | POA: Diagnosis not present

## 2017-04-01 DIAGNOSIS — C3412 Malignant neoplasm of upper lobe, left bronchus or lung: Secondary | ICD-10-CM | POA: Diagnosis not present

## 2017-04-01 DIAGNOSIS — I10 Essential (primary) hypertension: Secondary | ICD-10-CM | POA: Diagnosis not present

## 2017-04-01 DIAGNOSIS — Z800 Family history of malignant neoplasm of digestive organs: Secondary | ICD-10-CM | POA: Diagnosis not present

## 2017-04-01 DIAGNOSIS — E785 Hyperlipidemia, unspecified: Secondary | ICD-10-CM | POA: Diagnosis not present

## 2017-04-02 ENCOUNTER — Ambulatory Visit
Admission: RE | Admit: 2017-04-02 | Discharge: 2017-04-02 | Disposition: A | Discharge status: Home / Self Care | Payer: Medicare Other | Source: Ambulatory Visit | Attending: Radiation Oncology | Admitting: Radiation Oncology

## 2017-04-02 DIAGNOSIS — Z96641 Presence of right artificial hip joint: Secondary | ICD-10-CM | POA: Diagnosis not present

## 2017-04-02 DIAGNOSIS — M810 Age-related osteoporosis without current pathological fracture: Secondary | ICD-10-CM | POA: Diagnosis not present

## 2017-04-02 DIAGNOSIS — Z7982 Long term (current) use of aspirin: Secondary | ICD-10-CM | POA: Diagnosis not present

## 2017-04-02 DIAGNOSIS — C7931 Secondary malignant neoplasm of brain: Secondary | ICD-10-CM | POA: Diagnosis not present

## 2017-04-02 DIAGNOSIS — Z79899 Other long term (current) drug therapy: Secondary | ICD-10-CM | POA: Diagnosis not present

## 2017-04-02 DIAGNOSIS — G8929 Other chronic pain: Secondary | ICD-10-CM | POA: Diagnosis not present

## 2017-04-02 DIAGNOSIS — Z87891 Personal history of nicotine dependence: Secondary | ICD-10-CM | POA: Diagnosis not present

## 2017-04-02 DIAGNOSIS — C771 Secondary and unspecified malignant neoplasm of intrathoracic lymph nodes: Secondary | ICD-10-CM | POA: Diagnosis not present

## 2017-04-02 DIAGNOSIS — D219 Benign neoplasm of connective and other soft tissue, unspecified: Secondary | ICD-10-CM | POA: Diagnosis not present

## 2017-04-02 DIAGNOSIS — C3412 Malignant neoplasm of upper lobe, left bronchus or lung: Secondary | ICD-10-CM | POA: Diagnosis not present

## 2017-04-02 DIAGNOSIS — Z510 Encounter for antineoplastic radiation therapy: Secondary | ICD-10-CM | POA: Diagnosis not present

## 2017-04-02 DIAGNOSIS — Z803 Family history of malignant neoplasm of breast: Secondary | ICD-10-CM | POA: Diagnosis not present

## 2017-04-02 DIAGNOSIS — J449 Chronic obstructive pulmonary disease, unspecified: Secondary | ICD-10-CM | POA: Diagnosis not present

## 2017-04-02 DIAGNOSIS — E785 Hyperlipidemia, unspecified: Secondary | ICD-10-CM | POA: Diagnosis not present

## 2017-04-02 DIAGNOSIS — I10 Essential (primary) hypertension: Secondary | ICD-10-CM | POA: Diagnosis not present

## 2017-04-02 DIAGNOSIS — M545 Low back pain: Secondary | ICD-10-CM | POA: Diagnosis not present

## 2017-04-02 DIAGNOSIS — K219 Gastro-esophageal reflux disease without esophagitis: Secondary | ICD-10-CM | POA: Diagnosis not present

## 2017-04-02 DIAGNOSIS — Z800 Family history of malignant neoplasm of digestive organs: Secondary | ICD-10-CM | POA: Diagnosis not present

## 2017-04-03 ENCOUNTER — Ambulatory Visit
Admission: RE | Admit: 2017-04-03 | Discharge: 2017-04-03 | Disposition: A | Discharge status: Home / Self Care | Payer: Medicare Other | Source: Ambulatory Visit | Attending: Radiation Oncology | Admitting: Radiation Oncology

## 2017-04-03 VITALS — BP 126/86 | HR 102 | Temp 98.6°F

## 2017-04-03 DIAGNOSIS — C3412 Malignant neoplasm of upper lobe, left bronchus or lung: Secondary | ICD-10-CM | POA: Diagnosis not present

## 2017-04-03 DIAGNOSIS — M545 Low back pain: Secondary | ICD-10-CM | POA: Diagnosis not present

## 2017-04-03 DIAGNOSIS — Z510 Encounter for antineoplastic radiation therapy: Secondary | ICD-10-CM | POA: Diagnosis not present

## 2017-04-03 DIAGNOSIS — Z7982 Long term (current) use of aspirin: Secondary | ICD-10-CM | POA: Diagnosis not present

## 2017-04-03 DIAGNOSIS — Z87891 Personal history of nicotine dependence: Secondary | ICD-10-CM | POA: Diagnosis not present

## 2017-04-03 DIAGNOSIS — C771 Secondary and unspecified malignant neoplasm of intrathoracic lymph nodes: Secondary | ICD-10-CM | POA: Diagnosis not present

## 2017-04-03 DIAGNOSIS — Z96641 Presence of right artificial hip joint: Secondary | ICD-10-CM | POA: Diagnosis not present

## 2017-04-03 DIAGNOSIS — M810 Age-related osteoporosis without current pathological fracture: Secondary | ICD-10-CM | POA: Diagnosis not present

## 2017-04-03 DIAGNOSIS — I10 Essential (primary) hypertension: Secondary | ICD-10-CM | POA: Diagnosis not present

## 2017-04-03 DIAGNOSIS — K219 Gastro-esophageal reflux disease without esophagitis: Secondary | ICD-10-CM | POA: Diagnosis not present

## 2017-04-03 DIAGNOSIS — G8929 Other chronic pain: Secondary | ICD-10-CM | POA: Diagnosis not present

## 2017-04-03 DIAGNOSIS — C7931 Secondary malignant neoplasm of brain: Secondary | ICD-10-CM | POA: Diagnosis not present

## 2017-04-03 DIAGNOSIS — Z79899 Other long term (current) drug therapy: Secondary | ICD-10-CM | POA: Diagnosis not present

## 2017-04-03 DIAGNOSIS — C3490 Malignant neoplasm of unspecified part of unspecified bronchus or lung: Secondary | ICD-10-CM | POA: Diagnosis not present

## 2017-04-03 DIAGNOSIS — D219 Benign neoplasm of connective and other soft tissue, unspecified: Secondary | ICD-10-CM | POA: Diagnosis not present

## 2017-04-03 DIAGNOSIS — Z800 Family history of malignant neoplasm of digestive organs: Secondary | ICD-10-CM | POA: Diagnosis not present

## 2017-04-03 DIAGNOSIS — E785 Hyperlipidemia, unspecified: Secondary | ICD-10-CM | POA: Diagnosis not present

## 2017-04-03 DIAGNOSIS — Z803 Family history of malignant neoplasm of breast: Secondary | ICD-10-CM | POA: Diagnosis not present

## 2017-04-03 DIAGNOSIS — J449 Chronic obstructive pulmonary disease, unspecified: Secondary | ICD-10-CM | POA: Diagnosis not present

## 2017-04-03 NOTE — Progress Notes (Addendum)
Patient continues to deny having a headache or nausea.  Advised her to rest this afternoon.  Patient escorted to the elevator with her family.

## 2017-04-03 NOTE — Progress Notes (Signed)
Patient here for post [facility] [place] [facility] monitoring.  She denies having a headache or nausea.  She does report feeling "woozy" but did take Ativan before her treatment.  Will continue to monitor and family present in the room.

## 2017-04-03 NOTE — Op Note (Signed)
Stereotactic Radiosurgery Operative Note  Name: Jacqueline Kidd MRN: [id number]  Date: 04/03/2017  DOB: [birth year]  Op Note  Pre Operative Diagnosis:  Metastatic lung cancer with multiple brain metastases  Post Operative Diagnois:  Metastatic lung cancer with multiple brain metastases  3D TREATMENT PLANNING AND DOSIMETRY:  The patient's radiation plan was reviewed and approved by myself (neurosurgery) and Dr. Ledon Kidd (radiation oncology) prior to treatment.  It showed 3-dimensional radiation distributions overlaid onto the planning CT/MRI image set.  The [facility] for the target structures as well as the organs at risk were reviewed. The documentation of the 3D plan and dosimetry are filed in the radiation oncology EMR.  NARRATIVE:  Jacqueline Kidd was brought to the TrueBeam stereotactic radiation treatment machine and placed supine on the CT couch. The head frame was applied, and the patient was set up for stereotactic radiosurgery.  I was present for the set-up and delivery.  SIMULATION VERIFICATION:  In the couch zero-angle position, the patient underwent Exactrac imaging using the Brainlab system with orthogonal KV images.  These were carefully aligned and repeated to confirm treatment position for each of the isocenters.  The Exactrac snap film verification was repeated at each couch angle.  SPECIAL TREATMENT PROCEDURE: Jacqueline Kidd received stereotactic radiosurgery to the following targets: Left insula target (7 mm) was treated using 3 Dynamic Conformal Arcs to a prescription dose of 20 Gy.  ExacTrac registration was performed for each couch angle.  The 80.3% isodose line was prescribed. Left cerebellar target (12 mm) was treated using 3 Dynamic Conformal Arcs to a prescription dose of 20 Gy.  ExacTrac registration was performed for each couch angle.  The 81% isodose line was prescribed.  STEREOTACTIC TREATMENT MANAGEMENT:  Following delivery, the patient was transported to nursing in stable  condition and monitored for possible acute effects.  Vital signs were recorded. The patient tolerated treatment without significant acute effects, and was discharged to home in stable condition.    PLAN: Follow-up in one month.

## 2017-04-04 ENCOUNTER — Ambulatory Visit: Payer: Medicare Other

## 2017-04-05 NOTE — Progress Notes (Signed)
  Radiation Oncology         [phone] ________________________________  Stereotactic Treatment Procedure Note  Name: CARRIGAN DELAFUENTE MRN: [id number]  Date: 04/03/2017  DOB: Mar 10, 1941  SPECIAL TREATMENT PROCEDURE    ICD-10-CM   1. Brain metastases ([place]) C79.31     3D TREATMENT PLANNING AND DOSIMETRY:  The patient's radiation plan was reviewed and approved by neurosurgery and radiation oncology prior to treatment.  It showed 3-dimensional radiation distributions overlaid onto the planning CT/MRI image set.  The [facility] for the target structures as well as the organs at risk were reviewed. The documentation of the 3D plan and dosimetry are filed in the radiation oncology EMR.  NARRATIVE:  LAMONICA TRUEBA was brought to the TrueBeam stereotactic radiation treatment machine and placed supine on the CT couch. The head frame was applied, and the patient was set up for stereotactic radiosurgery.  Neurosurgery was present for the set-up and delivery  SIMULATION VERIFICATION:  In the couch zero-angle position, the patient underwent Exactrac imaging using the Brainlab system with orthogonal KV images.  These were carefully aligned and repeated to confirm treatment position for each of the isocenters.  The Exactrac snap film verification was repeated at each couch angle.  PROCEDURE: [name] received stereotactic radiosurgery to the following targets: Left cerebellar 12 mm target was treated using 3 Dynamic Conformal Arcs to a prescription dose of 20 Gy.  ExacTrac registration was performed for each couch angle.  The 80.3% isodose line was prescribed.  6 MV X-rays were delivered in the flattening filter free beam mode. Left insular 7 mm target was treated using 3 Dynamic Conformal Arcs to a prescription dose of 20 Gy.  ExacTrac registration was performed for each couch angle.  The 81.0% isodose line was prescribed.  6 MV X-rays were delivered in the flattening filter free beam mode.  STEREOTACTIC  TREATMENT MANAGEMENT:  Following delivery, the patient was transported to nursing in stable condition and monitored for possible acute effects.  Vital signs were recorded BP 126/86 (BP Location: Left Arm, Patient Position: Sitting)   Pulse (!) 102   Temp 98.6 F (37 C) (Oral)   SpO2 98% . The patient tolerated treatment without significant acute effects, and was discharged to home in stable condition.    PLAN: Follow-up in one month.  ________________________________  Sheral Apley. [name], M.D.

## 2017-04-06 ENCOUNTER — Ambulatory Visit
Admission: RE | Admit: 2017-04-06 | Discharge: 2017-04-06 | Disposition: A | Discharge status: Home / Self Care | Payer: Medicare Other | Source: Ambulatory Visit | Attending: Radiation Oncology | Admitting: Radiation Oncology

## 2017-04-06 DIAGNOSIS — Z79899 Other long term (current) drug therapy: Secondary | ICD-10-CM | POA: Diagnosis not present

## 2017-04-06 DIAGNOSIS — I10 Essential (primary) hypertension: Secondary | ICD-10-CM | POA: Diagnosis not present

## 2017-04-06 DIAGNOSIS — M545 Low back pain: Secondary | ICD-10-CM | POA: Diagnosis not present

## 2017-04-06 DIAGNOSIS — M810 Age-related osteoporosis without current pathological fracture: Secondary | ICD-10-CM | POA: Diagnosis not present

## 2017-04-06 DIAGNOSIS — C7931 Secondary malignant neoplasm of brain: Secondary | ICD-10-CM | POA: Diagnosis not present

## 2017-04-06 DIAGNOSIS — Z96641 Presence of right artificial hip joint: Secondary | ICD-10-CM | POA: Diagnosis not present

## 2017-04-06 DIAGNOSIS — Z7982 Long term (current) use of aspirin: Secondary | ICD-10-CM | POA: Diagnosis not present

## 2017-04-06 DIAGNOSIS — K219 Gastro-esophageal reflux disease without esophagitis: Secondary | ICD-10-CM | POA: Diagnosis not present

## 2017-04-06 DIAGNOSIS — Z510 Encounter for antineoplastic radiation therapy: Secondary | ICD-10-CM | POA: Diagnosis not present

## 2017-04-06 DIAGNOSIS — Z803 Family history of malignant neoplasm of breast: Secondary | ICD-10-CM | POA: Diagnosis not present

## 2017-04-06 DIAGNOSIS — C3412 Malignant neoplasm of upper lobe, left bronchus or lung: Secondary | ICD-10-CM | POA: Diagnosis not present

## 2017-04-06 DIAGNOSIS — G8929 Other chronic pain: Secondary | ICD-10-CM | POA: Diagnosis not present

## 2017-04-06 DIAGNOSIS — Z800 Family history of malignant neoplasm of digestive organs: Secondary | ICD-10-CM | POA: Diagnosis not present

## 2017-04-06 DIAGNOSIS — Z87891 Personal history of nicotine dependence: Secondary | ICD-10-CM | POA: Diagnosis not present

## 2017-04-06 DIAGNOSIS — J449 Chronic obstructive pulmonary disease, unspecified: Secondary | ICD-10-CM | POA: Diagnosis not present

## 2017-04-06 DIAGNOSIS — D219 Benign neoplasm of connective and other soft tissue, unspecified: Secondary | ICD-10-CM | POA: Diagnosis not present

## 2017-04-06 DIAGNOSIS — C771 Secondary and unspecified malignant neoplasm of intrathoracic lymph nodes: Secondary | ICD-10-CM | POA: Diagnosis not present

## 2017-04-06 DIAGNOSIS — E785 Hyperlipidemia, unspecified: Secondary | ICD-10-CM | POA: Diagnosis not present

## 2017-04-07 ENCOUNTER — Ambulatory Visit
Admission: RE | Admit: 2017-04-07 | Discharge: 2017-04-07 | Disposition: A | Discharge status: Home / Self Care | Payer: Medicare Other | Source: Ambulatory Visit | Attending: Radiation Oncology | Admitting: Radiation Oncology

## 2017-04-07 DIAGNOSIS — Z803 Family history of malignant neoplasm of breast: Secondary | ICD-10-CM | POA: Diagnosis not present

## 2017-04-07 DIAGNOSIS — I10 Essential (primary) hypertension: Secondary | ICD-10-CM | POA: Diagnosis not present

## 2017-04-07 DIAGNOSIS — Z79899 Other long term (current) drug therapy: Secondary | ICD-10-CM | POA: Diagnosis not present

## 2017-04-07 DIAGNOSIS — M810 Age-related osteoporosis without current pathological fracture: Secondary | ICD-10-CM | POA: Diagnosis not present

## 2017-04-07 DIAGNOSIS — M545 Low back pain: Secondary | ICD-10-CM | POA: Diagnosis not present

## 2017-04-07 DIAGNOSIS — C3412 Malignant neoplasm of upper lobe, left bronchus or lung: Secondary | ICD-10-CM | POA: Diagnosis not present

## 2017-04-07 DIAGNOSIS — D219 Benign neoplasm of connective and other soft tissue, unspecified: Secondary | ICD-10-CM | POA: Diagnosis not present

## 2017-04-07 DIAGNOSIS — Z96641 Presence of right artificial hip joint: Secondary | ICD-10-CM | POA: Diagnosis not present

## 2017-04-07 DIAGNOSIS — K219 Gastro-esophageal reflux disease without esophagitis: Secondary | ICD-10-CM | POA: Diagnosis not present

## 2017-04-07 DIAGNOSIS — G8929 Other chronic pain: Secondary | ICD-10-CM | POA: Diagnosis not present

## 2017-04-07 DIAGNOSIS — C771 Secondary and unspecified malignant neoplasm of intrathoracic lymph nodes: Secondary | ICD-10-CM | POA: Diagnosis not present

## 2017-04-07 DIAGNOSIS — Z510 Encounter for antineoplastic radiation therapy: Secondary | ICD-10-CM | POA: Diagnosis not present

## 2017-04-07 DIAGNOSIS — Z7982 Long term (current) use of aspirin: Secondary | ICD-10-CM | POA: Diagnosis not present

## 2017-04-07 DIAGNOSIS — C7931 Secondary malignant neoplasm of brain: Secondary | ICD-10-CM | POA: Diagnosis not present

## 2017-04-07 DIAGNOSIS — E785 Hyperlipidemia, unspecified: Secondary | ICD-10-CM | POA: Diagnosis not present

## 2017-04-07 DIAGNOSIS — Z87891 Personal history of nicotine dependence: Secondary | ICD-10-CM | POA: Diagnosis not present

## 2017-04-07 DIAGNOSIS — Z800 Family history of malignant neoplasm of digestive organs: Secondary | ICD-10-CM | POA: Diagnosis not present

## 2017-04-07 DIAGNOSIS — J449 Chronic obstructive pulmonary disease, unspecified: Secondary | ICD-10-CM | POA: Diagnosis not present

## 2017-04-08 ENCOUNTER — Ambulatory Visit
Admission: RE | Admit: 2017-04-08 | Discharge: 2017-04-08 | Disposition: A | Discharge status: Home / Self Care | Payer: Medicare Other | Source: Ambulatory Visit | Attending: Radiation Oncology | Admitting: Radiation Oncology

## 2017-04-08 DIAGNOSIS — Z510 Encounter for antineoplastic radiation therapy: Secondary | ICD-10-CM | POA: Diagnosis not present

## 2017-04-08 DIAGNOSIS — G8929 Other chronic pain: Secondary | ICD-10-CM | POA: Diagnosis not present

## 2017-04-08 DIAGNOSIS — Z96641 Presence of right artificial hip joint: Secondary | ICD-10-CM | POA: Diagnosis not present

## 2017-04-08 DIAGNOSIS — M810 Age-related osteoporosis without current pathological fracture: Secondary | ICD-10-CM | POA: Diagnosis not present

## 2017-04-08 DIAGNOSIS — M545 Low back pain: Secondary | ICD-10-CM | POA: Diagnosis not present

## 2017-04-08 DIAGNOSIS — Z87891 Personal history of nicotine dependence: Secondary | ICD-10-CM | POA: Diagnosis not present

## 2017-04-08 DIAGNOSIS — C771 Secondary and unspecified malignant neoplasm of intrathoracic lymph nodes: Secondary | ICD-10-CM | POA: Diagnosis not present

## 2017-04-08 DIAGNOSIS — Z800 Family history of malignant neoplasm of digestive organs: Secondary | ICD-10-CM | POA: Diagnosis not present

## 2017-04-08 DIAGNOSIS — Z7982 Long term (current) use of aspirin: Secondary | ICD-10-CM | POA: Diagnosis not present

## 2017-04-08 DIAGNOSIS — C7931 Secondary malignant neoplasm of brain: Secondary | ICD-10-CM | POA: Diagnosis not present

## 2017-04-08 DIAGNOSIS — E785 Hyperlipidemia, unspecified: Secondary | ICD-10-CM | POA: Diagnosis not present

## 2017-04-08 DIAGNOSIS — C3412 Malignant neoplasm of upper lobe, left bronchus or lung: Secondary | ICD-10-CM | POA: Diagnosis not present

## 2017-04-08 DIAGNOSIS — K219 Gastro-esophageal reflux disease without esophagitis: Secondary | ICD-10-CM | POA: Diagnosis not present

## 2017-04-08 DIAGNOSIS — J449 Chronic obstructive pulmonary disease, unspecified: Secondary | ICD-10-CM | POA: Diagnosis not present

## 2017-04-08 DIAGNOSIS — Z79899 Other long term (current) drug therapy: Secondary | ICD-10-CM | POA: Diagnosis not present

## 2017-04-08 DIAGNOSIS — Z803 Family history of malignant neoplasm of breast: Secondary | ICD-10-CM | POA: Diagnosis not present

## 2017-04-08 DIAGNOSIS — I10 Essential (primary) hypertension: Secondary | ICD-10-CM | POA: Diagnosis not present

## 2017-04-08 DIAGNOSIS — D219 Benign neoplasm of connective and other soft tissue, unspecified: Secondary | ICD-10-CM | POA: Diagnosis not present

## 2017-04-09 ENCOUNTER — Ambulatory Visit
Admission: RE | Admit: 2017-04-09 | Discharge: 2017-04-09 | Disposition: A | Discharge status: Home / Self Care | Payer: Medicare Other | Source: Ambulatory Visit | Attending: Radiation Oncology | Admitting: Radiation Oncology

## 2017-04-09 DIAGNOSIS — Z510 Encounter for antineoplastic radiation therapy: Secondary | ICD-10-CM | POA: Diagnosis not present

## 2017-04-09 DIAGNOSIS — D219 Benign neoplasm of connective and other soft tissue, unspecified: Secondary | ICD-10-CM | POA: Diagnosis not present

## 2017-04-09 DIAGNOSIS — Z96641 Presence of right artificial hip joint: Secondary | ICD-10-CM | POA: Diagnosis not present

## 2017-04-09 DIAGNOSIS — E785 Hyperlipidemia, unspecified: Secondary | ICD-10-CM | POA: Diagnosis not present

## 2017-04-09 DIAGNOSIS — I10 Essential (primary) hypertension: Secondary | ICD-10-CM | POA: Diagnosis not present

## 2017-04-09 DIAGNOSIS — Z800 Family history of malignant neoplasm of digestive organs: Secondary | ICD-10-CM | POA: Diagnosis not present

## 2017-04-09 DIAGNOSIS — Z803 Family history of malignant neoplasm of breast: Secondary | ICD-10-CM | POA: Diagnosis not present

## 2017-04-09 DIAGNOSIS — K219 Gastro-esophageal reflux disease without esophagitis: Secondary | ICD-10-CM | POA: Diagnosis not present

## 2017-04-09 DIAGNOSIS — Z7982 Long term (current) use of aspirin: Secondary | ICD-10-CM | POA: Diagnosis not present

## 2017-04-09 DIAGNOSIS — M545 Low back pain: Secondary | ICD-10-CM | POA: Diagnosis not present

## 2017-04-09 DIAGNOSIS — J449 Chronic obstructive pulmonary disease, unspecified: Secondary | ICD-10-CM | POA: Diagnosis not present

## 2017-04-09 DIAGNOSIS — Z79899 Other long term (current) drug therapy: Secondary | ICD-10-CM | POA: Diagnosis not present

## 2017-04-09 DIAGNOSIS — Z87891 Personal history of nicotine dependence: Secondary | ICD-10-CM | POA: Diagnosis not present

## 2017-04-09 DIAGNOSIS — M810 Age-related osteoporosis without current pathological fracture: Secondary | ICD-10-CM | POA: Diagnosis not present

## 2017-04-09 DIAGNOSIS — C7931 Secondary malignant neoplasm of brain: Secondary | ICD-10-CM | POA: Diagnosis not present

## 2017-04-09 DIAGNOSIS — G8929 Other chronic pain: Secondary | ICD-10-CM | POA: Diagnosis not present

## 2017-04-09 DIAGNOSIS — C3412 Malignant neoplasm of upper lobe, left bronchus or lung: Secondary | ICD-10-CM | POA: Diagnosis not present

## 2017-04-09 DIAGNOSIS — C771 Secondary and unspecified malignant neoplasm of intrathoracic lymph nodes: Secondary | ICD-10-CM | POA: Diagnosis not present

## 2017-04-10 ENCOUNTER — Other Ambulatory Visit: Payer: Self-pay | Admitting: Radiation Oncology

## 2017-04-10 ENCOUNTER — Ambulatory Visit
Admission: RE | Admit: 2017-04-10 | Discharge: 2017-04-10 | Disposition: A | Discharge status: Home / Self Care | Payer: Medicare Other | Source: Ambulatory Visit | Attending: Radiation Oncology | Admitting: Radiation Oncology

## 2017-04-10 DIAGNOSIS — C771 Secondary and unspecified malignant neoplasm of intrathoracic lymph nodes: Secondary | ICD-10-CM | POA: Diagnosis not present

## 2017-04-10 DIAGNOSIS — M810 Age-related osteoporosis without current pathological fracture: Secondary | ICD-10-CM | POA: Diagnosis not present

## 2017-04-10 DIAGNOSIS — Z7982 Long term (current) use of aspirin: Secondary | ICD-10-CM | POA: Diagnosis not present

## 2017-04-10 DIAGNOSIS — I10 Essential (primary) hypertension: Secondary | ICD-10-CM | POA: Diagnosis not present

## 2017-04-10 DIAGNOSIS — C7931 Secondary malignant neoplasm of brain: Secondary | ICD-10-CM | POA: Diagnosis not present

## 2017-04-10 DIAGNOSIS — C3412 Malignant neoplasm of upper lobe, left bronchus or lung: Secondary | ICD-10-CM

## 2017-04-10 DIAGNOSIS — J449 Chronic obstructive pulmonary disease, unspecified: Secondary | ICD-10-CM | POA: Diagnosis not present

## 2017-04-10 DIAGNOSIS — K219 Gastro-esophageal reflux disease without esophagitis: Secondary | ICD-10-CM | POA: Diagnosis not present

## 2017-04-10 DIAGNOSIS — Z800 Family history of malignant neoplasm of digestive organs: Secondary | ICD-10-CM | POA: Diagnosis not present

## 2017-04-10 DIAGNOSIS — Z510 Encounter for antineoplastic radiation therapy: Secondary | ICD-10-CM | POA: Diagnosis not present

## 2017-04-10 DIAGNOSIS — G8929 Other chronic pain: Secondary | ICD-10-CM | POA: Diagnosis not present

## 2017-04-10 DIAGNOSIS — D219 Benign neoplasm of connective and other soft tissue, unspecified: Secondary | ICD-10-CM | POA: Diagnosis not present

## 2017-04-10 DIAGNOSIS — Z96641 Presence of right artificial hip joint: Secondary | ICD-10-CM | POA: Diagnosis not present

## 2017-04-10 DIAGNOSIS — M545 Low back pain: Secondary | ICD-10-CM | POA: Diagnosis not present

## 2017-04-10 DIAGNOSIS — Z79899 Other long term (current) drug therapy: Secondary | ICD-10-CM | POA: Diagnosis not present

## 2017-04-10 DIAGNOSIS — Z803 Family history of malignant neoplasm of breast: Secondary | ICD-10-CM | POA: Diagnosis not present

## 2017-04-10 DIAGNOSIS — Z87891 Personal history of nicotine dependence: Secondary | ICD-10-CM | POA: Diagnosis not present

## 2017-04-10 DIAGNOSIS — E785 Hyperlipidemia, unspecified: Secondary | ICD-10-CM | POA: Diagnosis not present

## 2017-04-10 MED ORDER — SUCRALFATE 1 G PO TABS: 1.0000 g | ORAL_TABLET | Freq: Three times a day (TID) | ORAL | 2 refills | Status: DC

## 2017-04-11 ENCOUNTER — Ambulatory Visit: Payer: Medicare Other

## 2017-04-13 ENCOUNTER — Ambulatory Visit
Admission: RE | Admit: 2017-04-13 | Discharge: 2017-04-13 | Disposition: A | Discharge status: Home / Self Care | Payer: Medicare Other | Source: Ambulatory Visit | Attending: Radiation Oncology | Admitting: Radiation Oncology

## 2017-04-13 DIAGNOSIS — E785 Hyperlipidemia, unspecified: Secondary | ICD-10-CM | POA: Diagnosis not present

## 2017-04-13 DIAGNOSIS — I10 Essential (primary) hypertension: Secondary | ICD-10-CM | POA: Diagnosis not present

## 2017-04-13 DIAGNOSIS — J449 Chronic obstructive pulmonary disease, unspecified: Secondary | ICD-10-CM | POA: Diagnosis not present

## 2017-04-13 DIAGNOSIS — Z87891 Personal history of nicotine dependence: Secondary | ICD-10-CM | POA: Diagnosis not present

## 2017-04-13 DIAGNOSIS — D219 Benign neoplasm of connective and other soft tissue, unspecified: Secondary | ICD-10-CM | POA: Diagnosis not present

## 2017-04-13 DIAGNOSIS — C771 Secondary and unspecified malignant neoplasm of intrathoracic lymph nodes: Secondary | ICD-10-CM | POA: Diagnosis not present

## 2017-04-13 DIAGNOSIS — K219 Gastro-esophageal reflux disease without esophagitis: Secondary | ICD-10-CM | POA: Diagnosis not present

## 2017-04-13 DIAGNOSIS — Z7982 Long term (current) use of aspirin: Secondary | ICD-10-CM | POA: Diagnosis not present

## 2017-04-13 DIAGNOSIS — G8929 Other chronic pain: Secondary | ICD-10-CM | POA: Diagnosis not present

## 2017-04-13 DIAGNOSIS — Z79899 Other long term (current) drug therapy: Secondary | ICD-10-CM | POA: Diagnosis not present

## 2017-04-13 DIAGNOSIS — M810 Age-related osteoporosis without current pathological fracture: Secondary | ICD-10-CM | POA: Diagnosis not present

## 2017-04-13 DIAGNOSIS — C3412 Malignant neoplasm of upper lobe, left bronchus or lung: Secondary | ICD-10-CM | POA: Diagnosis not present

## 2017-04-13 DIAGNOSIS — Z510 Encounter for antineoplastic radiation therapy: Secondary | ICD-10-CM | POA: Diagnosis not present

## 2017-04-13 DIAGNOSIS — Z96641 Presence of right artificial hip joint: Secondary | ICD-10-CM | POA: Diagnosis not present

## 2017-04-13 DIAGNOSIS — Z800 Family history of malignant neoplasm of digestive organs: Secondary | ICD-10-CM | POA: Diagnosis not present

## 2017-04-13 DIAGNOSIS — M545 Low back pain: Secondary | ICD-10-CM | POA: Diagnosis not present

## 2017-04-13 DIAGNOSIS — Z803 Family history of malignant neoplasm of breast: Secondary | ICD-10-CM | POA: Diagnosis not present

## 2017-04-13 DIAGNOSIS — C7931 Secondary malignant neoplasm of brain: Secondary | ICD-10-CM | POA: Diagnosis not present

## 2017-04-14 ENCOUNTER — Ambulatory Visit
Admission: RE | Admit: 2017-04-14 | Discharge: 2017-04-14 | Disposition: A | Discharge status: Home / Self Care | Payer: Medicare Other | Source: Ambulatory Visit | Attending: Radiation Oncology | Admitting: Radiation Oncology

## 2017-04-14 DIAGNOSIS — I10 Essential (primary) hypertension: Secondary | ICD-10-CM | POA: Diagnosis not present

## 2017-04-14 DIAGNOSIS — C3412 Malignant neoplasm of upper lobe, left bronchus or lung: Secondary | ICD-10-CM | POA: Diagnosis not present

## 2017-04-14 DIAGNOSIS — C771 Secondary and unspecified malignant neoplasm of intrathoracic lymph nodes: Secondary | ICD-10-CM | POA: Diagnosis not present

## 2017-04-14 DIAGNOSIS — K219 Gastro-esophageal reflux disease without esophagitis: Secondary | ICD-10-CM | POA: Diagnosis not present

## 2017-04-14 DIAGNOSIS — Z7982 Long term (current) use of aspirin: Secondary | ICD-10-CM | POA: Diagnosis not present

## 2017-04-14 DIAGNOSIS — D219 Benign neoplasm of connective and other soft tissue, unspecified: Secondary | ICD-10-CM | POA: Diagnosis not present

## 2017-04-14 DIAGNOSIS — E785 Hyperlipidemia, unspecified: Secondary | ICD-10-CM | POA: Diagnosis not present

## 2017-04-14 DIAGNOSIS — Z96641 Presence of right artificial hip joint: Secondary | ICD-10-CM | POA: Diagnosis not present

## 2017-04-14 DIAGNOSIS — Z803 Family history of malignant neoplasm of breast: Secondary | ICD-10-CM | POA: Diagnosis not present

## 2017-04-14 DIAGNOSIS — C7931 Secondary malignant neoplasm of brain: Secondary | ICD-10-CM | POA: Diagnosis not present

## 2017-04-14 DIAGNOSIS — Z87891 Personal history of nicotine dependence: Secondary | ICD-10-CM | POA: Diagnosis not present

## 2017-04-14 DIAGNOSIS — Z79899 Other long term (current) drug therapy: Secondary | ICD-10-CM | POA: Diagnosis not present

## 2017-04-14 DIAGNOSIS — Z800 Family history of malignant neoplasm of digestive organs: Secondary | ICD-10-CM | POA: Diagnosis not present

## 2017-04-14 DIAGNOSIS — M810 Age-related osteoporosis without current pathological fracture: Secondary | ICD-10-CM | POA: Diagnosis not present

## 2017-04-14 DIAGNOSIS — M545 Low back pain: Secondary | ICD-10-CM | POA: Diagnosis not present

## 2017-04-14 DIAGNOSIS — Z510 Encounter for antineoplastic radiation therapy: Secondary | ICD-10-CM | POA: Diagnosis not present

## 2017-04-14 DIAGNOSIS — J449 Chronic obstructive pulmonary disease, unspecified: Secondary | ICD-10-CM | POA: Diagnosis not present

## 2017-04-14 DIAGNOSIS — G8929 Other chronic pain: Secondary | ICD-10-CM | POA: Diagnosis not present

## 2017-04-15 ENCOUNTER — Ambulatory Visit (HOSPITAL_BASED_OUTPATIENT_CLINIC_OR_DEPARTMENT_OTHER): Payer: Medicare Other | Admitting: Oncology

## 2017-04-15 ENCOUNTER — Ambulatory Visit (HOSPITAL_BASED_OUTPATIENT_CLINIC_OR_DEPARTMENT_OTHER): Payer: Medicare Other

## 2017-04-15 ENCOUNTER — Ambulatory Visit
Admission: RE | Admit: 2017-04-15 | Discharge: 2017-04-15 | Disposition: A | Discharge status: Home / Self Care | Payer: Medicare Other | Source: Ambulatory Visit | Attending: Radiation Oncology | Admitting: Radiation Oncology

## 2017-04-15 ENCOUNTER — Other Ambulatory Visit (HOSPITAL_BASED_OUTPATIENT_CLINIC_OR_DEPARTMENT_OTHER): Payer: Medicare Other

## 2017-04-15 VITALS — BP 122/66 | HR 106 | Temp 98.3°F | Resp 17 | Ht 61.5 in | Wt 159.8 lb

## 2017-04-15 VITALS — BP 111/65 | HR 92 | Temp 98.3°F | Resp 18

## 2017-04-15 DIAGNOSIS — G8929 Other chronic pain: Secondary | ICD-10-CM | POA: Diagnosis not present

## 2017-04-15 DIAGNOSIS — C3412 Malignant neoplasm of upper lobe, left bronchus or lung: Secondary | ICD-10-CM

## 2017-04-15 DIAGNOSIS — K219 Gastro-esophageal reflux disease without esophagitis: Secondary | ICD-10-CM | POA: Diagnosis not present

## 2017-04-15 DIAGNOSIS — R05 Cough: Secondary | ICD-10-CM

## 2017-04-15 DIAGNOSIS — R1110 Vomiting, unspecified: Secondary | ICD-10-CM

## 2017-04-15 DIAGNOSIS — Z510 Encounter for antineoplastic radiation therapy: Secondary | ICD-10-CM | POA: Diagnosis not present

## 2017-04-15 DIAGNOSIS — D219 Benign neoplasm of connective and other soft tissue, unspecified: Secondary | ICD-10-CM | POA: Diagnosis not present

## 2017-04-15 DIAGNOSIS — K5900 Constipation, unspecified: Secondary | ICD-10-CM | POA: Diagnosis not present

## 2017-04-15 DIAGNOSIS — Z5111 Encounter for antineoplastic chemotherapy: Secondary | ICD-10-CM | POA: Diagnosis not present

## 2017-04-15 DIAGNOSIS — E785 Hyperlipidemia, unspecified: Secondary | ICD-10-CM | POA: Diagnosis not present

## 2017-04-15 DIAGNOSIS — Z7982 Long term (current) use of aspirin: Secondary | ICD-10-CM | POA: Diagnosis not present

## 2017-04-15 DIAGNOSIS — M545 Low back pain: Secondary | ICD-10-CM | POA: Diagnosis not present

## 2017-04-15 DIAGNOSIS — Z87891 Personal history of nicotine dependence: Secondary | ICD-10-CM | POA: Diagnosis not present

## 2017-04-15 DIAGNOSIS — J449 Chronic obstructive pulmonary disease, unspecified: Secondary | ICD-10-CM | POA: Diagnosis not present

## 2017-04-15 DIAGNOSIS — Z800 Family history of malignant neoplasm of digestive organs: Secondary | ICD-10-CM | POA: Diagnosis not present

## 2017-04-15 DIAGNOSIS — Z803 Family history of malignant neoplasm of breast: Secondary | ICD-10-CM | POA: Diagnosis not present

## 2017-04-15 DIAGNOSIS — Z23 Encounter for immunization: Secondary | ICD-10-CM | POA: Diagnosis not present

## 2017-04-15 DIAGNOSIS — C771 Secondary and unspecified malignant neoplasm of intrathoracic lymph nodes: Secondary | ICD-10-CM | POA: Diagnosis not present

## 2017-04-15 DIAGNOSIS — M810 Age-related osteoporosis without current pathological fracture: Secondary | ICD-10-CM | POA: Diagnosis not present

## 2017-04-15 DIAGNOSIS — C3492 Malignant neoplasm of unspecified part of left bronchus or lung: Secondary | ICD-10-CM

## 2017-04-15 DIAGNOSIS — C778 Secondary and unspecified malignant neoplasm of lymph nodes of multiple regions: Secondary | ICD-10-CM | POA: Diagnosis not present

## 2017-04-15 DIAGNOSIS — C7931 Secondary malignant neoplasm of brain: Secondary | ICD-10-CM | POA: Diagnosis not present

## 2017-04-15 DIAGNOSIS — I10 Essential (primary) hypertension: Secondary | ICD-10-CM | POA: Diagnosis not present

## 2017-04-15 DIAGNOSIS — Z96641 Presence of right artificial hip joint: Secondary | ICD-10-CM | POA: Diagnosis not present

## 2017-04-15 DIAGNOSIS — Z79899 Other long term (current) drug therapy: Secondary | ICD-10-CM | POA: Diagnosis not present

## 2017-04-15 LAB — COMPREHENSIVE METABOLIC PANEL
ALBUMIN: 3.5 g/dL (ref 3.5–5.0)
ALT: 24 U/L (ref 0–55)
AST: 32 U/L (ref 5–34)
Alkaline Phosphatase: 115 U/L (ref 40–150)
Anion Gap: 11 mEq/L (ref 3–11)
BILIRUBIN TOTAL: 0.93 mg/dL (ref 0.20–1.20)
BUN: 6.8 mg/dL — AB (ref 7.0–26.0)
CO2: 24 meq/L (ref 22–29)
CREATININE: 0.8 mg/dL (ref 0.6–1.1)
Calcium: 9.5 mg/dL (ref 8.4–10.4)
Chloride: 105 mEq/L (ref 98–109)
EGFR: >60 mL/min/{1.73_m2} (ref >60)
GLUCOSE: 125 mg/dL (ref 70–140)
Potassium: 3.7 mEq/L (ref 3.5–5.1)
SODIUM: 139 meq/L (ref 136–145)
TOTAL PROTEIN: 8 g/dL (ref 6.4–8.3)

## 2017-04-15 LAB — CBC WITH DIFFERENTIAL/PLATELET
BASO%: 0.3 % (ref 0.0–2.0)
Basophils Absolute: 0 10*3/uL (ref 0.0–0.1)
EOS%: 2 % (ref 0.0–7.0)
Eosinophils Absolute: 0.1 10*3/uL (ref 0.0–0.5)
HCT: 41.6 % (ref 34.8–46.6)
HEMOGLOBIN: 13.8 g/dL (ref 11.6–15.9)
LYMPH%: 12.1 % — AB (ref 14.0–49.7)
MCH: 31.8 pg (ref 25.1–34.0)
MCHC: 33.2 g/dL (ref 31.5–36.0)
MCV: 95.9 fL (ref 79.5–101.0)
MONO#: 0.5 10*3/uL (ref 0.1–0.9)
MONO%: 13.8 % (ref 0.0–14.0)
NEUT%: 71.8 % (ref 38.4–76.8)
NEUTROS ABS: 2.5 10*3/uL (ref 1.5–6.5)
Platelets: 155 10*3/uL (ref 145–400)
RBC: 4.34 10*6/uL (ref 3.70–5.45)
RDW: 14.2 % (ref 11.2–14.5)
WBC: 3.5 10*3/uL — AB (ref 3.9–10.3)
lymph#: 0.4 10*3/uL — ABNORMAL LOW (ref 0.9–3.3)

## 2017-04-15 MED ORDER — DEXAMETHASONE SODIUM PHOSPHATE 10 MG/ML IJ SOLN
INTRAMUSCULAR | Status: AC
  Filled 2017-04-15: qty 1

## 2017-04-15 MED ORDER — INFLUENZA VAC SPLIT HIGH-DOSE 0.5 ML IM SUSY: 0.5000 mL | PREFILLED_SYRINGE | INTRAMUSCULAR | Status: DC

## 2017-04-15 MED ORDER — PALONOSETRON HCL INJECTION 0.25 MG/5ML
0.2500 mg | Freq: Once | INTRAVENOUS | Status: AC
  Administered 2017-04-15: 0.25 mg via INTRAVENOUS

## 2017-04-15 MED ORDER — PEGFILGRASTIM 6 MG/0.6ML ~~LOC~~ PSKT
6.0000 mg | PREFILLED_SYRINGE | Freq: Once | SUBCUTANEOUS | Status: DC
  Filled 2017-04-15: qty 0.6

## 2017-04-15 MED ORDER — SODIUM CHLORIDE 0.9 % IV SOLN
300.0000 mg | Freq: Once | INTRAVENOUS | Status: AC
  Administered 2017-04-15: 300 mg via INTRAVENOUS
  Filled 2017-04-15: qty 50

## 2017-04-15 MED ORDER — FAMOTIDINE IN NACL 20-0.9 MG/50ML-% IV SOLN
20.0000 mg | Freq: Once | INTRAVENOUS | Status: AC
  Administered 2017-04-15: 20 mg via INTRAVENOUS

## 2017-04-15 MED ORDER — DIPHENHYDRAMINE HCL 50 MG/ML IJ SOLN
50.0000 mg | Freq: Once | INTRAMUSCULAR | Status: AC
  Administered 2017-04-15: 50 mg via INTRAVENOUS

## 2017-04-15 MED ORDER — FAMOTIDINE IN NACL 20-0.9 MG/50ML-% IV SOLN
INTRAVENOUS | Status: AC
  Filled 2017-04-15: qty 50

## 2017-04-15 MED ORDER — DIPHENHYDRAMINE HCL 50 MG/ML IJ SOLN
INTRAMUSCULAR | Status: AC
  Filled 2017-04-15: qty 1

## 2017-04-15 MED ORDER — SODIUM CHLORIDE 0.9 % IV SOLN
Freq: Once | INTRAVENOUS | Status: AC
  Administered 2017-04-15: 10:00:00 via INTRAVENOUS

## 2017-04-15 MED ORDER — CARBOPLATIN CHEMO INJECTION 600 MG/60ML
404.5000 mg | Freq: Once | INTRAVENOUS | Status: AC
  Administered 2017-04-15: 400 mg via INTRAVENOUS
  Filled 2017-04-15: qty 40

## 2017-04-15 MED ORDER — PALONOSETRON HCL INJECTION 0.25 MG/5ML
INTRAVENOUS | Status: AC
  Filled 2017-04-15: qty 5

## 2017-04-15 MED ORDER — INFLUENZA VAC SPLIT HIGH-DOSE 0.5 ML IM SUSY
0.5000 mL | PREFILLED_SYRINGE | Freq: Once | INTRAMUSCULAR | Status: AC
  Administered 2017-04-15: 0.5 mL via INTRAMUSCULAR
  Filled 2017-04-15: qty 0.5

## 2017-04-15 MED ORDER — SODIUM CHLORIDE 0.9 % IV SOLN
20.0000 mg | Freq: Once | INTRAVENOUS | Status: AC
  Administered 2017-04-15: 20 mg via INTRAVENOUS
  Filled 2017-04-15: qty 2

## 2017-04-15 NOTE — Patient Instructions (Addendum)
[place] Discharge Instructions for Patients Receiving Chemotherapy  Today you received the following chemotherapy agents Carboplatin and  Taxol  To help prevent nausea and vomiting after your treatment, we encourage you to take your nausea medication as directed   If you develop nausea and vomiting that is not controlled by your nausea medication, call the clinic.   BELOW ARE SYMPTOMS THAT SHOULD BE REPORTED IMMEDIATELY:  *FEVER GREATER THAN 100.5 F  *CHILLS WITH OR WITHOUT FEVER  NAUSEA AND VOMITING THAT IS NOT CONTROLLED WITH YOUR NAUSEA MEDICATION  *UNUSUAL SHORTNESS OF BREATH  *UNUSUAL BRUISING OR BLEEDING  TENDERNESS IN MOUTH AND THROAT WITH OR WITHOUT PRESENCE OF ULCERS  *URINARY PROBLEMS  *BOWEL PROBLEMS  UNUSUAL RASH Items with * indicate a potential emergency and should be followed up as soon as possible.  Feel free to call the clinic should you have any questions or concerns. The clinic phone number is [phone].  Please show the [place] at check-in to the Emergency Department and triage nurse.   Paclitaxel (Taxol) injection What is this medicine? PACLITAXEL (PAK li TAX el) is a chemotherapy drug. It targets fast dividing cells, like cancer cells, and causes these cells to die. This medicine is used to treat ovarian cancer, breast cancer, and other cancers. This medicine may be used for other purposes; ask your health care provider or pharmacist if you have questions. COMMON BRAND NAME(S): Onxol, Taxol What should I tell my health care provider before I take this medicine? They need to know if you have any of these conditions: -blood disorders -irregular heartbeat -infection (especially a virus infection such as chickenpox, cold sores, or herpes) -liver disease -previous or ongoing radiation therapy -an unusual or allergic reaction to paclitaxel, alcohol, polyoxyethylated castor oil, other chemotherapy agents, other medicines,  foods, dyes, or preservatives -pregnant or trying to get pregnant -breast-feeding How should I use this medicine? This drug is given as an infusion into a vein. It is administered in a hospital or clinic by a specially trained health care professional. Talk to your pediatrician regarding the use of this medicine in children. Special care may be needed. Overdosage: If you think you have taken too much of this medicine contact a poison control center or emergency room at once. NOTE: This medicine is only for you. Do not share this medicine with others. What if I miss a dose? It is important not to miss your dose. Call your doctor or health care professional if you are unable to keep an appointment. What may interact with this medicine? Do not take this medicine with any of the following medications: -disulfiram -metronidazole This medicine may also interact with the following medications: -cyclosporine -diazepam -ketoconazole -medicines to increase blood counts like filgrastim, pegfilgrastim, sargramostim -other chemotherapy drugs like cisplatin, doxorubicin, epirubicin, etoposide, teniposide, vincristine -quinidine -testosterone -vaccines -verapamil Talk to your doctor or health care professional before taking any of these medicines: -acetaminophen -aspirin -ibuprofen -ketoprofen -naproxen This list may not describe all possible interactions. Give your health care provider a list of all the medicines, herbs, non-prescription drugs, or dietary supplements you use. Also tell them if you smoke, drink alcohol, or use illegal drugs. Some items may interact with your medicine. What should I watch for while using this medicine? Your condition will be monitored carefully while you are receiving this medicine. You will need important blood work done while you are taking this medicine. This medicine can cause serious allergic reactions. To reduce your risk  you will need to take other  medicine(s) before treatment with this medicine. If you experience allergic reactions like skin rash, itching or hives, swelling of the face, lips, or tongue, tell your doctor or health care professional right away. In some cases, you may be given additional medicines to help with side effects. Follow all directions for their use. This drug may make you feel generally unwell. This is not uncommon, as chemotherapy can affect healthy cells as well as cancer cells. Report any side effects. Continue your course of treatment even though you feel ill unless your doctor tells you to stop. Call your doctor or health care professional for advice if you get a fever, chills or sore throat, or other symptoms of a cold or flu. Do not treat yourself. This drug decreases your body's ability to fight infections. Try to avoid being around people who are sick. This medicine may increase your risk to bruise or bleed. Call your doctor or health care professional if you notice any unusual bleeding. Be careful brushing and flossing your teeth or using a toothpick because you may get an infection or bleed more easily. If you have any dental work done, tell your dentist you are receiving this medicine. Avoid taking products that contain aspirin, acetaminophen, ibuprofen, naproxen, or ketoprofen unless instructed by your doctor. These medicines may hide a fever. Do not become pregnant while taking this medicine. Women should inform their doctor if they wish to become pregnant or think they might be pregnant. There is a potential for serious side effects to an unborn child. Talk to your health care professional or pharmacist for more information. Do not breast-feed an infant while taking this medicine. Men are advised not to father a child while receiving this medicine. This product may contain alcohol. Ask your pharmacist or healthcare provider if this medicine contains alcohol. Be sure to tell all healthcare providers you are  taking this medicine. Certain medicines, like metronidazole and disulfiram, can cause an unpleasant reaction when taken with alcohol. The reaction includes flushing, headache, nausea, vomiting, sweating, and increased thirst. The reaction can last from 30 minutes to several hours. What side effects may I notice from receiving this medicine? Side effects that you should report to your doctor or health care professional as soon as possible: -allergic reactions like skin rash, itching or hives, swelling of the face, lips, or tongue -low blood counts - This drug may decrease the number of white blood cells, red blood cells and platelets. You may be at increased risk for infections and bleeding. -signs of infection - fever or chills, cough, sore throat, pain or difficulty passing urine -signs of decreased platelets or bleeding - bruising, pinpoint red spots on the skin, black, tarry stools, nosebleeds -signs of decreased red blood cells - unusually weak or tired, fainting spells, lightheadedness -breathing problems -chest pain -high or low blood pressure -mouth sores -nausea and vomiting -pain, swelling, redness or irritation at the injection site -pain, tingling, numbness in the hands or feet -slow or irregular heartbeat -swelling of the ankle, feet, hands Side effects that usually do not require medical attention (report to your doctor or health care professional if they continue or are bothersome): -bone pain -complete hair loss including hair on your head, underarms, pubic hair, eyebrows, and eyelashes -changes in the color of fingernails -diarrhea -loosening of the fingernails -loss of appetite -muscle or joint pain -red flush to skin -sweating This list may not describe all possible side effects. Call your doctor  for medical advice about side effects. You may report side effects to FDA at 1-800-FDA-1088. Where should I keep my medicine? This drug is given in a hospital or clinic and  will not be stored at home. NOTE: This sheet is a summary. It may not cover all possible information. If you have questions about this medicine, talk to your doctor, pharmacist, or health care provider.  2018 Elsevier/Gold Standard (2015-04-24 19:58:00)    Carboplatin injection What is this medicine? CARBOPLATIN (KAR boe pla tin) is a chemotherapy drug. It targets fast dividing cells, like cancer cells, and causes these cells to die. This medicine is used to treat ovarian cancer and many other cancers. This medicine may be used for other purposes; ask your health care provider or pharmacist if you have questions. COMMON BRAND NAME(S): Paraplatin What should I tell my health care provider before I take this medicine? They need to know if you have any of these conditions: -blood disorders -hearing problems -kidney disease -recent or ongoing radiation therapy -an unusual or allergic reaction to carboplatin, cisplatin, other chemotherapy, other medicines, foods, dyes, or preservatives -pregnant or trying to get pregnant -breast-feeding How should I use this medicine? This drug is usually given as an infusion into a vein. It is administered in a hospital or clinic by a specially trained health care professional. Talk to your pediatrician regarding the use of this medicine in children. Special care may be needed. Overdosage: If you think you have taken too much of this medicine contact a poison control center or emergency room at once. NOTE: This medicine is only for you. Do not share this medicine with others. What if I miss a dose? It is important not to miss a dose. Call your doctor or health care professional if you are unable to keep an appointment. What may interact with this medicine? -medicines for seizures -medicines to increase blood counts like filgrastim, pegfilgrastim, sargramostim -some antibiotics like amikacin, gentamicin, neomycin, streptomycin, tobramycin -vaccines Talk  to your doctor or health care professional before taking any of these medicines: -acetaminophen -aspirin -ibuprofen -ketoprofen -naproxen This list may not describe all possible interactions. Give your health care provider a list of all the medicines, herbs, non-prescription drugs, or dietary supplements you use. Also tell them if you smoke, drink alcohol, or use illegal drugs. Some items may interact with your medicine. What should I watch for while using this medicine? Your condition will be monitored carefully while you are receiving this medicine. You will need important blood work done while you are taking this medicine. This drug may make you feel generally unwell. This is not uncommon, as chemotherapy can affect healthy cells as well as cancer cells. Report any side effects. Continue your course of treatment even though you feel ill unless your doctor tells you to stop. In some cases, you may be given additional medicines to help with side effects. Follow all directions for their use. Call your doctor or health care professional for advice if you get a fever, chills or sore throat, or other symptoms of a cold or flu. Do not treat yourself. This drug decreases your body's ability to fight infections. Try to avoid being around people who are sick. This medicine may increase your risk to bruise or bleed. Call your doctor or health care professional if you notice any unusual bleeding. Be careful brushing and flossing your teeth or using a toothpick because you may get an infection or bleed more easily. If you have any dental  work done, tell your dentist you are receiving this medicine. Avoid taking products that contain aspirin, acetaminophen, ibuprofen, naproxen, or ketoprofen unless instructed by your doctor. These medicines may hide a fever. Do not become pregnant while taking this medicine. Women should inform their doctor if they wish to become pregnant or think they might be pregnant. There  is a potential for serious side effects to an unborn child. Talk to your health care professional or pharmacist for more information. Do not breast-feed an infant while taking this medicine. What side effects may I notice from receiving this medicine? Side effects that you should report to your doctor or health care professional as soon as possible: -allergic reactions like skin rash, itching or hives, swelling of the face, lips, or tongue -signs of infection - fever or chills, cough, sore throat, pain or difficulty passing urine -signs of decreased platelets or bleeding - bruising, pinpoint red spots on the skin, black, tarry stools, nosebleeds -signs of decreased red blood cells - unusually weak or tired, fainting spells, lightheadedness -breathing problems -changes in hearing -changes in vision -chest pain -high blood pressure -low blood counts - This drug may decrease the number of white blood cells, red blood cells and platelets. You may be at increased risk for infections and bleeding. -nausea and vomiting -pain, swelling, redness or irritation at the injection site -pain, tingling, numbness in the hands or feet -problems with balance, talking, walking -trouble passing urine or change in the amount of urine Side effects that usually do not require medical attention (report to your doctor or health care professional if they continue or are bothersome): -hair loss -loss of appetite -metallic taste in the mouth or changes in taste This list may not describe all possible side effects. Call your doctor for medical advice about side effects. You may report side effects to FDA at 1-800-FDA-1088. Where should I keep my medicine? This drug is given in a hospital or clinic and will not be stored at home. NOTE: This sheet is a summary. It may not cover all possible information. If you have questions about this medicine, talk to your doctor, pharmacist, or health care provider.  2018  Elsevier/Gold Standard (2007-09-28 14:38:05)

## 2017-04-15 NOTE — Assessment & Plan Note (Addendum)
This is a very pleasant 76 year old white female with stage IV (T2b, N3, M1 C) non-small cell lung cancer, squamous cell carcinoma presented with large left upper lobe lung mass in addition to mediastinal and supraclavicular lymphadenopathy as well as metastatic brain lesions diagnosed in September 2018.  The patient was seen with Dr. Julien Nordmann. Recommended for the patient to continue with the palliative radiotherapy to the left upper lobe lung mass and monitoring of the metastatic brain lesions under the care of Dr. [name].  The patient will proceed with palliative systemic chemotherapy. She will receive carboplatin for AUC of 5 and paclitaxel 175 MG/M2 every 3 weeks with Neulasta support.  Adverse effect of this treatment were again reviewed including but not limited to alopecia, myelosuppression, nausea and vomiting, liver or renal dysfunction.  The patient will have a weekly lab while on treatment. The patient will return in 3 weeks for evaluation prior to cycle 2 of her chemotherapy.  For constipation, the patient may begin Senokot S 1 tablet twice a day. If not effective, she may increase this to 2 tablets twice a day.  Flu vaccine was administered today per the patient request.  The patient voices understanding of current disease status and treatment options and is in agreement with the current care plan. All questions were answered. The patient knows to call the clinic with any problems, questions or concerns. We can certainly see the patient much sooner if necessary.

## 2017-04-15 NOTE — Progress Notes (Signed)
Pt concerned about her out of pocket cost for Neulasta or Onpro. Because of her concerns  Julien Nordmann said she may go without it for this treatment.

## 2017-04-15 NOTE — Progress Notes (Signed)
[facility] Follow up:    Jacqueline Maize, MD [place] [id number]   DIAGNOSIS: Stage IV(T2b, N3, M1c)  lung cancer pending further staging workup and tissue diagnosis. The patient presented with large left upper lobe lung mass in addition to left hilar and mediastinal lymphadenopathy and left true vocal cord paralysis.  SUMMARY OF ONCOLOGIC HISTORY:  No history exists.    CURRENT THERAPY: 1) palliative radiotherapy to the large left upper lobe lung mass as well as metastatic brain lesions. 2) systemic chemotherapy with carboplatin for AUC of 5 and paclitaxel 175 MG/M2 every 3 weeks. First dose on 04/15/2017.  INTERVAL HISTORY: Jacqueline Kidd 76 y.o. female returns for routine follow-up with her daughter-in-law. The patient is feeling fine today except for having intermittent vomiting when she takes her Carafate. The patient has some sensation of food getting stuck when she swallows. She is not really having any pain. The patient reports that her voice is now stronger. She continues on radiation to her chest until the end of the month. She has completed SRS to the brain. Denies fevers and chills. Reports a dry cough. Denies chest pain, shortness of breath, and hemoptysis. He does not have the sensation of nausea, but does not vomit with the Carafate. Denies diarrhea. Reports constipation. She took a stool softener a few days ago. The patient is here for evaluation prior to cycle 1 of her chemotherapy.   Patient Active Problem List   Diagnosis Date Noted  . Need for influenza vaccination 04/15/2017  . Brain metastases ([place]) 03/30/2017  . Goals of care, counseling/discussion 03/23/2017  . Encounter for antineoplastic chemotherapy 03/23/2017  . Mass of parotid gland Apr 28, 202018  . Primary cancer of left upper lobe of lung ([place]) 02/19/2017  . Lung mass 02/16/2017  . BMI 33.0-33.9,adult 03/21/2016  . Peripheral edema 03/21/2016  . Osteoporosis 06/21/2014   . Hypertension 01/21/2013    is allergic to fosamax [alendronate]; hydrocodone-acetaminophen; and lisinopril-hydrochlorothiazide.  MEDICAL HISTORY: Past Medical History:  Diagnosis Date  . Chronic low back pain   . COPD (chronic obstructive pulmonary disease) ([place])   . GERD (gastroesophageal reflux disease)   . Hyperlipidemia   . Hypertension   . Lesion of left lung   . Osteoporosis     SURGICAL HISTORY: Past Surgical History:  Procedure Laterality Date  . ABDOMINAL HYSTERECTOMY    . TOTAL HIP ARTHROPLASTY     right    SOCIAL HISTORY: Social History   Social History  . Marital status: Married    Spouse name: N/A  . Number of children: N/A  . Years of education: N/A   Occupational History  . Not on file.   Social History Main Topics  . Smoking status: Former Smoker    Packs/day: 1.00    Years: 57.00    Quit date: 02/16/2017  . Smokeless tobacco: Never Used  . Alcohol use No  . Drug use: No  . Sexual activity: No   Other Topics Concern  . Not on file   Social History Narrative  . No narrative on file    FAMILY HISTORY: Family History  Problem Relation Age of Onset  . Cancer Mother        breast  . Cancer Father        esophageal  . Cancer Sister        breast    Review of Systems  Constitutional: Negative.   HENT:  Negative.   Eyes:  Negative.   Respiratory: Positive for cough. Negative for hemoptysis and shortness of breath.   Cardiovascular: Negative.   Gastrointestinal: Positive for constipation. Negative for diarrhea and nausea.       Vomits after taking Carafate.  Genitourinary: Negative.    Musculoskeletal: Negative.   Skin: Negative.   Neurological: Negative.   Hematological: Negative.   Psychiatric/Behavioral: Negative.       PHYSICAL EXAMINATION  ECOG PERFORMANCE STATUS: 1 - Symptomatic but completely ambulatory  Vitals:   04/15/17 0823  BP: 122/66  Pulse: (!) 106  Resp: 17  Temp: 98.3 F (36.8 C)  SpO2: 96%     Physical Exam  Constitutional: She is oriented to person, place, and time and well-developed, well-nourished, and in no distress. No distress.  HENT:  Head: Normocephalic.  Mouth/Throat: Oropharynx is clear and moist. No oropharyngeal exudate.  Eyes: Conjunctivae are normal. Right eye exhibits no discharge. Left eye exhibits no discharge. No scleral icterus.  Neck: Normal range of motion. Neck supple.  Cardiovascular: Normal rate, regular rhythm, normal heart sounds and intact distal pulses.   Pulmonary/Chest: Effort normal and breath sounds normal. No respiratory distress. She has no wheezes. She has no rales.  Abdominal: Soft. Bowel sounds are normal. She exhibits no distension and no mass. There is no tenderness.  Musculoskeletal: Normal range of motion. She exhibits no edema.  Lymphadenopathy:    She has no cervical adenopathy.  Neurological: She is alert and oriented to person, place, and time. She exhibits normal muscle tone. Coordination normal.  Skin: Skin is warm and dry. No rash noted. She is not diaphoretic. No erythema. No pallor.  Psychiatric: Mood, memory, affect and judgment normal.  Vitals reviewed.   LABORATORY DATA:  CBC    Component Value Date/Time   WBC 3.5 (L) 04/15/2017 0801   WBC 8.5 03/18/2017 1007   RBC 4.34 04/15/2017 0801   RBC 4.46 03/18/2017 1007   HGB 13.8 04/15/2017 0801   HCT 41.6 04/15/2017 0801   PLT 155 04/15/2017 0801   PLT 242 07/04/2016 1211   MCV 95.9 04/15/2017 0801   MCH 31.8 04/15/2017 0801   MCH 31.2 03/18/2017 1007   MCHC 33.2 04/15/2017 0801   MCHC 32.9 03/18/2017 1007   RDW 14.2 04/15/2017 0801   LYMPHSABS 0.4 (L) 04/15/2017 0801   MONOABS 0.5 04/15/2017 0801   EOSABS 0.1 04/15/2017 0801   EOSABS 0.1 07/04/2016 1211   BASOSABS 0.0 04/15/2017 0801    CMP     Component Value Date/Time   NA 139 04/15/2017 0801   K 3.7 04/15/2017 0801   CL 101 07/04/2016 1211   CO2 24 04/15/2017 0801   GLUCOSE 125 04/15/2017 0801    BUN 6.8 (L) 04/15/2017 0801   CREATININE 0.8 04/15/2017 0801   CALCIUM 9.5 04/15/2017 0801   PROT 8.0 04/15/2017 0801   ALBUMIN 3.5 04/15/2017 0801   AST 32 04/15/2017 0801   ALT 24 04/15/2017 0801   ALKPHOS 115 04/15/2017 0801   BILITOT 0.93 04/15/2017 0801   GFRNONAA 78 07/04/2016 1211   GFRNONAA 76 01/21/2013 1526   GFRAA 90 07/04/2016 1211   GFRAA 88 01/21/2013 1526    RADIOGRAPHIC STUDIES:  No results found. ASSESSMENT and THERAPY PLAN:   Primary cancer of left upper lobe of lung ([place]) This is a very pleasant 76 year old white female with stage IV (T2b, N3, M1 C) non-small cell lung cancer, squamous cell carcinoma presented with large left upper lobe lung mass in addition to mediastinal  and supraclavicular lymphadenopathy as well as metastatic brain lesions diagnosed in September 2018.  The patient was seen with Dr. Julien Nordmann. Recommended for the patient to continue with the palliative radiotherapy to the left upper lobe lung mass and monitoring of the metastatic brain lesions under the care of Dr. [name].  The patient will proceed with palliative systemic chemotherapy. She will receive carboplatin for AUC of 5 and paclitaxel 175 MG/M2 every 3 weeks with Neulasta support.  Adverse effect of this treatment were again reviewed including but not limited to alopecia, myelosuppression, nausea and vomiting, liver or renal dysfunction.  The patient will have a weekly lab while on treatment. The patient will return in 3 weeks for evaluation prior to cycle 2 of her chemotherapy.  For constipation, the patient may begin Senokot S 1 tablet twice a day. If not effective, she may increase this to 2 tablets twice a day.  Flu vaccine was administered today per the patient request.  The patient voices understanding of current disease status and treatment options and is in agreement with the current care plan. All questions were answered. The patient knows to call the clinic with any problems,  questions or concerns. We can certainly see the patient much sooner if necessary.   No orders of the defined types were placed in this encounter.   All questions were answered. The patient knows to call the clinic with any problems, questions or concerns. We can certainly see the patient much sooner if necessary.  Mikey Bussing, NP  04/15/2017   ADDENDUM: Hematology/Oncology Attending: I had a face to face encounter with the patient. I recommended her care plan. This is a very pleasant 76 years old white female with a stage IV non-small cell lung cancer, squamous cell carcinoma presented with large left upper lobe lung mass in addition to mediastinal and supraclavicular lymphadenopathy as well as brain metastasis diagnosed in September 2018. The patient is status post palliative radiotherapy to the brain and lung mass. She is here today to start the first cycle of palliative systemic chemotherapy was carboplatin and paclitaxel. The patient is feeling fine with no specific complaints. We'll proceed with her treatment today as a scheduled. She was reminded of the adverse effect of the chemotherapy and the patient would like to proceed with treatment as planned. She would come back for follow-up visit in 3 weeks for evaluation before starting cycle #2. The patient was advised to call immediately if she has any concerning symptoms in the interval.  Disclaimer: This note was dictated with voice recognition software. Similar sounding words can inadvertently be transcribed and may be missed upon review. Eilleen Kempf, MD 04/16/17

## 2017-04-16 ENCOUNTER — Other Ambulatory Visit: Payer: Self-pay | Admitting: Radiation Oncology

## 2017-04-16 ENCOUNTER — Telehealth: Payer: Self-pay | Admitting: *Deleted

## 2017-04-16 ENCOUNTER — Telehealth: Payer: Self-pay | Admitting: Medical Oncology

## 2017-04-16 ENCOUNTER — Ambulatory Visit
Admission: RE | Admit: 2017-04-16 | Discharge: 2017-04-16 | Disposition: A | Discharge status: Home / Self Care | Payer: Medicare Other | Source: Ambulatory Visit | Attending: Radiation Oncology | Admitting: Radiation Oncology

## 2017-04-16 ENCOUNTER — Encounter: Payer: Self-pay | Admitting: Oncology

## 2017-04-16 DIAGNOSIS — C7931 Secondary malignant neoplasm of brain: Secondary | ICD-10-CM | POA: Diagnosis not present

## 2017-04-16 DIAGNOSIS — Z7982 Long term (current) use of aspirin: Secondary | ICD-10-CM | POA: Diagnosis not present

## 2017-04-16 DIAGNOSIS — J449 Chronic obstructive pulmonary disease, unspecified: Secondary | ICD-10-CM | POA: Diagnosis not present

## 2017-04-16 DIAGNOSIS — Z510 Encounter for antineoplastic radiation therapy: Secondary | ICD-10-CM | POA: Diagnosis not present

## 2017-04-16 DIAGNOSIS — C771 Secondary and unspecified malignant neoplasm of intrathoracic lymph nodes: Secondary | ICD-10-CM | POA: Diagnosis not present

## 2017-04-16 DIAGNOSIS — Z87891 Personal history of nicotine dependence: Secondary | ICD-10-CM | POA: Diagnosis not present

## 2017-04-16 DIAGNOSIS — C3412 Malignant neoplasm of upper lobe, left bronchus or lung: Secondary | ICD-10-CM

## 2017-04-16 DIAGNOSIS — Z800 Family history of malignant neoplasm of digestive organs: Secondary | ICD-10-CM | POA: Diagnosis not present

## 2017-04-16 DIAGNOSIS — K219 Gastro-esophageal reflux disease without esophagitis: Secondary | ICD-10-CM | POA: Diagnosis not present

## 2017-04-16 DIAGNOSIS — M545 Low back pain: Secondary | ICD-10-CM | POA: Diagnosis not present

## 2017-04-16 DIAGNOSIS — Z803 Family history of malignant neoplasm of breast: Secondary | ICD-10-CM | POA: Diagnosis not present

## 2017-04-16 DIAGNOSIS — G8929 Other chronic pain: Secondary | ICD-10-CM | POA: Diagnosis not present

## 2017-04-16 DIAGNOSIS — Z96641 Presence of right artificial hip joint: Secondary | ICD-10-CM | POA: Diagnosis not present

## 2017-04-16 DIAGNOSIS — E785 Hyperlipidemia, unspecified: Secondary | ICD-10-CM | POA: Diagnosis not present

## 2017-04-16 DIAGNOSIS — D219 Benign neoplasm of connective and other soft tissue, unspecified: Secondary | ICD-10-CM | POA: Diagnosis not present

## 2017-04-16 DIAGNOSIS — Z79899 Other long term (current) drug therapy: Secondary | ICD-10-CM | POA: Diagnosis not present

## 2017-04-16 DIAGNOSIS — I10 Essential (primary) hypertension: Secondary | ICD-10-CM | POA: Diagnosis not present

## 2017-04-16 DIAGNOSIS — M810 Age-related osteoporosis without current pathological fracture: Secondary | ICD-10-CM | POA: Diagnosis not present

## 2017-04-16 MED ORDER — LIDOCAINE VISCOUS 2 % MT SOLN: 5.0000 mL | OROMUCOSAL | 5 refills | Status: DC | PRN

## 2017-04-16 NOTE — Telephone Encounter (Addendum)
Called to f/u tx. She is doing fine except for arthritis pain.Can she take Tylenol ES for her arthritis pain. I told her she can take some ,but not regularly.

## 2017-04-16 NOTE — Telephone Encounter (Signed)
FYI "This is Production assistant, radio from [employer].  The neulasta ordered for this patient requires proir authorization per insurance formulary.  Will this be billed under Part B or Part D?  It in formula it should be part "D" but I will prepare the member with cost expectation for coverage under both.  If any questions call the customer service number."

## 2017-04-17 ENCOUNTER — Ambulatory Visit
Admission: RE | Admit: 2017-04-17 | Discharge: 2017-04-17 | Disposition: A | Discharge status: Home / Self Care | Payer: Medicare Other | Source: Ambulatory Visit | Attending: Radiation Oncology | Admitting: Radiation Oncology

## 2017-04-17 ENCOUNTER — Telehealth: Payer: Self-pay | Admitting: Oncology

## 2017-04-17 DIAGNOSIS — E785 Hyperlipidemia, unspecified: Secondary | ICD-10-CM | POA: Diagnosis not present

## 2017-04-17 DIAGNOSIS — Z510 Encounter for antineoplastic radiation therapy: Secondary | ICD-10-CM | POA: Diagnosis not present

## 2017-04-17 DIAGNOSIS — G8929 Other chronic pain: Secondary | ICD-10-CM | POA: Diagnosis not present

## 2017-04-17 DIAGNOSIS — C7931 Secondary malignant neoplasm of brain: Secondary | ICD-10-CM | POA: Diagnosis not present

## 2017-04-17 DIAGNOSIS — M810 Age-related osteoporosis without current pathological fracture: Secondary | ICD-10-CM | POA: Diagnosis not present

## 2017-04-17 DIAGNOSIS — Z800 Family history of malignant neoplasm of digestive organs: Secondary | ICD-10-CM | POA: Diagnosis not present

## 2017-04-17 DIAGNOSIS — Z79899 Other long term (current) drug therapy: Secondary | ICD-10-CM | POA: Diagnosis not present

## 2017-04-17 DIAGNOSIS — Z96641 Presence of right artificial hip joint: Secondary | ICD-10-CM | POA: Diagnosis not present

## 2017-04-17 DIAGNOSIS — M545 Low back pain: Secondary | ICD-10-CM | POA: Diagnosis not present

## 2017-04-17 DIAGNOSIS — C3412 Malignant neoplasm of upper lobe, left bronchus or lung: Secondary | ICD-10-CM | POA: Diagnosis not present

## 2017-04-17 DIAGNOSIS — Z803 Family history of malignant neoplasm of breast: Secondary | ICD-10-CM | POA: Diagnosis not present

## 2017-04-17 DIAGNOSIS — J449 Chronic obstructive pulmonary disease, unspecified: Secondary | ICD-10-CM | POA: Diagnosis not present

## 2017-04-17 DIAGNOSIS — Z7982 Long term (current) use of aspirin: Secondary | ICD-10-CM | POA: Diagnosis not present

## 2017-04-17 DIAGNOSIS — I10 Essential (primary) hypertension: Secondary | ICD-10-CM | POA: Diagnosis not present

## 2017-04-17 DIAGNOSIS — K219 Gastro-esophageal reflux disease without esophagitis: Secondary | ICD-10-CM | POA: Diagnosis not present

## 2017-04-17 DIAGNOSIS — D219 Benign neoplasm of connective and other soft tissue, unspecified: Secondary | ICD-10-CM | POA: Diagnosis not present

## 2017-04-17 DIAGNOSIS — C771 Secondary and unspecified malignant neoplasm of intrathoracic lymph nodes: Secondary | ICD-10-CM | POA: Diagnosis not present

## 2017-04-17 DIAGNOSIS — Z87891 Personal history of nicotine dependence: Secondary | ICD-10-CM | POA: Diagnosis not present

## 2017-04-17 NOTE — Telephone Encounter (Signed)
Called regarding 10/31 but will send a letter

## 2017-04-18 ENCOUNTER — Ambulatory Visit: Payer: Medicare Other

## 2017-04-20 ENCOUNTER — Ambulatory Visit
Admission: RE | Admit: 2017-04-20 | Discharge: 2017-04-20 | Disposition: A | Discharge status: Home / Self Care | Payer: Medicare Other | Source: Ambulatory Visit | Attending: Radiation Oncology | Admitting: Radiation Oncology

## 2017-04-20 ENCOUNTER — Telehealth: Payer: Self-pay

## 2017-04-20 DIAGNOSIS — E785 Hyperlipidemia, unspecified: Secondary | ICD-10-CM | POA: Diagnosis not present

## 2017-04-20 DIAGNOSIS — C771 Secondary and unspecified malignant neoplasm of intrathoracic lymph nodes: Secondary | ICD-10-CM | POA: Diagnosis not present

## 2017-04-20 DIAGNOSIS — Z800 Family history of malignant neoplasm of digestive organs: Secondary | ICD-10-CM | POA: Diagnosis not present

## 2017-04-20 DIAGNOSIS — K219 Gastro-esophageal reflux disease without esophagitis: Secondary | ICD-10-CM | POA: Diagnosis not present

## 2017-04-20 DIAGNOSIS — Z7982 Long term (current) use of aspirin: Secondary | ICD-10-CM | POA: Diagnosis not present

## 2017-04-20 DIAGNOSIS — Z510 Encounter for antineoplastic radiation therapy: Secondary | ICD-10-CM | POA: Diagnosis not present

## 2017-04-20 DIAGNOSIS — I10 Essential (primary) hypertension: Secondary | ICD-10-CM | POA: Diagnosis not present

## 2017-04-20 DIAGNOSIS — Z96641 Presence of right artificial hip joint: Secondary | ICD-10-CM | POA: Diagnosis not present

## 2017-04-20 DIAGNOSIS — M545 Low back pain: Secondary | ICD-10-CM | POA: Diagnosis not present

## 2017-04-20 DIAGNOSIS — G8929 Other chronic pain: Secondary | ICD-10-CM | POA: Diagnosis not present

## 2017-04-20 DIAGNOSIS — Z87891 Personal history of nicotine dependence: Secondary | ICD-10-CM | POA: Diagnosis not present

## 2017-04-20 DIAGNOSIS — Z79899 Other long term (current) drug therapy: Secondary | ICD-10-CM | POA: Diagnosis not present

## 2017-04-20 DIAGNOSIS — C7931 Secondary malignant neoplasm of brain: Secondary | ICD-10-CM | POA: Diagnosis not present

## 2017-04-20 DIAGNOSIS — C3412 Malignant neoplasm of upper lobe, left bronchus or lung: Secondary | ICD-10-CM | POA: Diagnosis not present

## 2017-04-20 DIAGNOSIS — J449 Chronic obstructive pulmonary disease, unspecified: Secondary | ICD-10-CM | POA: Diagnosis not present

## 2017-04-20 DIAGNOSIS — M810 Age-related osteoporosis without current pathological fracture: Secondary | ICD-10-CM | POA: Diagnosis not present

## 2017-04-20 DIAGNOSIS — Z803 Family history of malignant neoplasm of breast: Secondary | ICD-10-CM | POA: Diagnosis not present

## 2017-04-20 DIAGNOSIS — D219 Benign neoplasm of connective and other soft tissue, unspecified: Secondary | ICD-10-CM | POA: Diagnosis not present

## 2017-04-20 NOTE — Telephone Encounter (Signed)
Daughter in law Evelena Peat called that medicare advantage will be sending a repeat PA request for the neulasta onpro. Pt had concerns about it's cost at C1 chemo 04/15/17 and onpro was held for that cycle.  Evelena Peat had talked with [facility] and then when called again there was no notes of prior conversation.  Plan B will be much less expensive for the pt. She does have a plan D as well. The PA line at [facility] advantage is [phone]. Evelena Peat phone is [phone].

## 2017-04-21 ENCOUNTER — Ambulatory Visit
Admission: RE | Admit: 2017-04-21 | Discharge: 2017-04-21 | Disposition: A | Discharge status: Home / Self Care | Payer: Medicare Other | Source: Ambulatory Visit | Attending: Radiation Oncology | Admitting: Radiation Oncology

## 2017-04-21 DIAGNOSIS — M545 Low back pain: Secondary | ICD-10-CM | POA: Diagnosis not present

## 2017-04-21 DIAGNOSIS — M810 Age-related osteoporosis without current pathological fracture: Secondary | ICD-10-CM | POA: Diagnosis not present

## 2017-04-21 DIAGNOSIS — C3412 Malignant neoplasm of upper lobe, left bronchus or lung: Secondary | ICD-10-CM | POA: Diagnosis not present

## 2017-04-21 DIAGNOSIS — Z7982 Long term (current) use of aspirin: Secondary | ICD-10-CM | POA: Diagnosis not present

## 2017-04-21 DIAGNOSIS — D219 Benign neoplasm of connective and other soft tissue, unspecified: Secondary | ICD-10-CM | POA: Diagnosis not present

## 2017-04-21 DIAGNOSIS — I10 Essential (primary) hypertension: Secondary | ICD-10-CM | POA: Diagnosis not present

## 2017-04-21 DIAGNOSIS — J449 Chronic obstructive pulmonary disease, unspecified: Secondary | ICD-10-CM | POA: Diagnosis not present

## 2017-04-21 DIAGNOSIS — Z803 Family history of malignant neoplasm of breast: Secondary | ICD-10-CM | POA: Diagnosis not present

## 2017-04-21 DIAGNOSIS — E785 Hyperlipidemia, unspecified: Secondary | ICD-10-CM | POA: Diagnosis not present

## 2017-04-21 DIAGNOSIS — Z510 Encounter for antineoplastic radiation therapy: Secondary | ICD-10-CM | POA: Diagnosis not present

## 2017-04-21 DIAGNOSIS — C7931 Secondary malignant neoplasm of brain: Secondary | ICD-10-CM | POA: Diagnosis not present

## 2017-04-21 DIAGNOSIS — Z87891 Personal history of nicotine dependence: Secondary | ICD-10-CM | POA: Diagnosis not present

## 2017-04-21 DIAGNOSIS — G8929 Other chronic pain: Secondary | ICD-10-CM | POA: Diagnosis not present

## 2017-04-21 DIAGNOSIS — Z79899 Other long term (current) drug therapy: Secondary | ICD-10-CM | POA: Diagnosis not present

## 2017-04-21 DIAGNOSIS — Z96641 Presence of right artificial hip joint: Secondary | ICD-10-CM | POA: Diagnosis not present

## 2017-04-21 DIAGNOSIS — K219 Gastro-esophageal reflux disease without esophagitis: Secondary | ICD-10-CM | POA: Diagnosis not present

## 2017-04-21 DIAGNOSIS — C771 Secondary and unspecified malignant neoplasm of intrathoracic lymph nodes: Secondary | ICD-10-CM | POA: Diagnosis not present

## 2017-04-21 DIAGNOSIS — Z800 Family history of malignant neoplasm of digestive organs: Secondary | ICD-10-CM | POA: Diagnosis not present

## 2017-04-22 ENCOUNTER — Ambulatory Visit: Payer: Medicare Other | Admitting: Oncology

## 2017-04-22 ENCOUNTER — Ambulatory Visit
Admission: RE | Admit: 2017-04-22 | Discharge: 2017-04-22 | Disposition: A | Discharge status: Home / Self Care | Payer: Medicare Other | Source: Ambulatory Visit | Attending: Radiation Oncology | Admitting: Radiation Oncology

## 2017-04-22 ENCOUNTER — Other Ambulatory Visit (HOSPITAL_BASED_OUTPATIENT_CLINIC_OR_DEPARTMENT_OTHER): Payer: Medicare Other

## 2017-04-22 ENCOUNTER — Telehealth: Payer: Self-pay | Admitting: Medical Oncology

## 2017-04-22 DIAGNOSIS — M545 Low back pain: Secondary | ICD-10-CM | POA: Diagnosis not present

## 2017-04-22 DIAGNOSIS — Z79899 Other long term (current) drug therapy: Secondary | ICD-10-CM | POA: Diagnosis not present

## 2017-04-22 DIAGNOSIS — C7931 Secondary malignant neoplasm of brain: Secondary | ICD-10-CM | POA: Diagnosis not present

## 2017-04-22 DIAGNOSIS — C771 Secondary and unspecified malignant neoplasm of intrathoracic lymph nodes: Secondary | ICD-10-CM | POA: Diagnosis not present

## 2017-04-22 DIAGNOSIS — C3412 Malignant neoplasm of upper lobe, left bronchus or lung: Secondary | ICD-10-CM

## 2017-04-22 DIAGNOSIS — Z803 Family history of malignant neoplasm of breast: Secondary | ICD-10-CM | POA: Diagnosis not present

## 2017-04-22 DIAGNOSIS — Z87891 Personal history of nicotine dependence: Secondary | ICD-10-CM | POA: Diagnosis not present

## 2017-04-22 DIAGNOSIS — M810 Age-related osteoporosis without current pathological fracture: Secondary | ICD-10-CM | POA: Diagnosis not present

## 2017-04-22 DIAGNOSIS — Z7982 Long term (current) use of aspirin: Secondary | ICD-10-CM | POA: Diagnosis not present

## 2017-04-22 DIAGNOSIS — E785 Hyperlipidemia, unspecified: Secondary | ICD-10-CM | POA: Diagnosis not present

## 2017-04-22 DIAGNOSIS — Z96641 Presence of right artificial hip joint: Secondary | ICD-10-CM | POA: Diagnosis not present

## 2017-04-22 DIAGNOSIS — K219 Gastro-esophageal reflux disease without esophagitis: Secondary | ICD-10-CM | POA: Diagnosis not present

## 2017-04-22 DIAGNOSIS — Z510 Encounter for antineoplastic radiation therapy: Secondary | ICD-10-CM | POA: Diagnosis not present

## 2017-04-22 DIAGNOSIS — J449 Chronic obstructive pulmonary disease, unspecified: Secondary | ICD-10-CM | POA: Diagnosis not present

## 2017-04-22 DIAGNOSIS — D219 Benign neoplasm of connective and other soft tissue, unspecified: Secondary | ICD-10-CM | POA: Diagnosis not present

## 2017-04-22 DIAGNOSIS — Z800 Family history of malignant neoplasm of digestive organs: Secondary | ICD-10-CM | POA: Diagnosis not present

## 2017-04-22 DIAGNOSIS — G8929 Other chronic pain: Secondary | ICD-10-CM | POA: Diagnosis not present

## 2017-04-22 DIAGNOSIS — I10 Essential (primary) hypertension: Secondary | ICD-10-CM | POA: Diagnosis not present

## 2017-04-22 DIAGNOSIS — C3492 Malignant neoplasm of unspecified part of left bronchus or lung: Secondary | ICD-10-CM

## 2017-04-22 LAB — COMPREHENSIVE METABOLIC PANEL
ALBUMIN: 3.4 g/dL — AB (ref 3.5–5.0)
ALK PHOS: 100 U/L (ref 40–150)
ALT: 41 U/L (ref 0–55)
AST: 45 U/L — AB (ref 5–34)
Anion Gap: 8 mEq/L (ref 3–11)
BILIRUBIN TOTAL: 1.19 mg/dL (ref 0.20–1.20)
BUN: 9.3 mg/dL (ref 7.0–26.0)
CO2: 28 mEq/L (ref 22–29)
Calcium: 9.2 mg/dL (ref 8.4–10.4)
Chloride: 102 mEq/L (ref 98–109)
Creatinine: 0.8 mg/dL (ref 0.6–1.1)
EGFR: >60 mL/min/{1.73_m2} (ref >60)
GLUCOSE: 138 mg/dL (ref 70–140)
Potassium: 3.9 mEq/L (ref 3.5–5.1)
SODIUM: 138 meq/L (ref 136–145)
TOTAL PROTEIN: 7.5 g/dL (ref 6.4–8.3)

## 2017-04-22 LAB — CBC WITH DIFFERENTIAL/PLATELET
BASO%: 0.6 % (ref 0.0–2.0)
BASOS ABS: 0 10*3/uL (ref 0.0–0.1)
EOS%: 2 % (ref 0.0–7.0)
Eosinophils Absolute: 0 10*3/uL (ref 0.0–0.5)
HCT: 38.4 % (ref 34.8–46.6)
HEMOGLOBIN: 12.9 g/dL (ref 11.6–15.9)
LYMPH#: 0.2 10*3/uL — AB (ref 0.9–3.3)
LYMPH%: 14.1 % (ref 14.0–49.7)
MCH: 31.5 pg (ref 25.1–34.0)
MCHC: 33.7 g/dL (ref 31.5–36.0)
MCV: 93.4 fL (ref 79.5–101.0)
MONO#: 0.1 10*3/uL (ref 0.1–0.9)
MONO%: 4.3 % (ref 0.0–14.0)
NEUT%: 79 % — ABNORMAL HIGH (ref 38.4–76.8)
NEUTROS ABS: 1.4 10*3/uL — AB (ref 1.5–6.5)
Platelets: 101 10*3/uL — ABNORMAL LOW (ref 145–400)
RBC: 4.11 10*6/uL (ref 3.70–5.45)
RDW: 13.9 % (ref 11.2–14.5)
WBC: 1.7 10*3/uL — AB (ref 3.9–10.3)

## 2017-04-22 NOTE — Telephone Encounter (Signed)
Jacqueline Kidd is concerned about neulasta- Prior [name] is necessary -and pt cost, because pt is concerned about it. I sent message to Louann to check on this.

## 2017-04-23 ENCOUNTER — Other Ambulatory Visit: Payer: Self-pay | Admitting: Internal Medicine

## 2017-04-23 ENCOUNTER — Ambulatory Visit
Admission: RE | Admit: 2017-04-23 | Discharge: 2017-04-23 | Disposition: A | Discharge status: Home / Self Care | Payer: Medicare Other | Source: Ambulatory Visit | Attending: Radiation Oncology | Admitting: Radiation Oncology

## 2017-04-23 DIAGNOSIS — Z800 Family history of malignant neoplasm of digestive organs: Secondary | ICD-10-CM | POA: Diagnosis not present

## 2017-04-23 DIAGNOSIS — Z803 Family history of malignant neoplasm of breast: Secondary | ICD-10-CM | POA: Diagnosis not present

## 2017-04-23 DIAGNOSIS — K219 Gastro-esophageal reflux disease without esophagitis: Secondary | ICD-10-CM | POA: Diagnosis not present

## 2017-04-23 DIAGNOSIS — C7931 Secondary malignant neoplasm of brain: Secondary | ICD-10-CM | POA: Diagnosis not present

## 2017-04-23 DIAGNOSIS — J449 Chronic obstructive pulmonary disease, unspecified: Secondary | ICD-10-CM | POA: Diagnosis not present

## 2017-04-23 DIAGNOSIS — M545 Low back pain: Secondary | ICD-10-CM | POA: Diagnosis not present

## 2017-04-23 DIAGNOSIS — Z96641 Presence of right artificial hip joint: Secondary | ICD-10-CM | POA: Diagnosis not present

## 2017-04-23 DIAGNOSIS — D219 Benign neoplasm of connective and other soft tissue, unspecified: Secondary | ICD-10-CM | POA: Diagnosis not present

## 2017-04-23 DIAGNOSIS — Z7982 Long term (current) use of aspirin: Secondary | ICD-10-CM | POA: Diagnosis not present

## 2017-04-23 DIAGNOSIS — Z87891 Personal history of nicotine dependence: Secondary | ICD-10-CM | POA: Diagnosis not present

## 2017-04-23 DIAGNOSIS — Z510 Encounter for antineoplastic radiation therapy: Secondary | ICD-10-CM | POA: Diagnosis not present

## 2017-04-23 DIAGNOSIS — I10 Essential (primary) hypertension: Secondary | ICD-10-CM | POA: Diagnosis not present

## 2017-04-23 DIAGNOSIS — C3412 Malignant neoplasm of upper lobe, left bronchus or lung: Secondary | ICD-10-CM | POA: Diagnosis not present

## 2017-04-23 DIAGNOSIS — C771 Secondary and unspecified malignant neoplasm of intrathoracic lymph nodes: Secondary | ICD-10-CM | POA: Diagnosis not present

## 2017-04-23 DIAGNOSIS — M810 Age-related osteoporosis without current pathological fracture: Secondary | ICD-10-CM | POA: Diagnosis not present

## 2017-04-23 DIAGNOSIS — G8929 Other chronic pain: Secondary | ICD-10-CM | POA: Diagnosis not present

## 2017-04-23 DIAGNOSIS — Z79899 Other long term (current) drug therapy: Secondary | ICD-10-CM | POA: Diagnosis not present

## 2017-04-23 DIAGNOSIS — E785 Hyperlipidemia, unspecified: Secondary | ICD-10-CM | POA: Diagnosis not present

## 2017-04-24 ENCOUNTER — Ambulatory Visit
Admission: RE | Admit: 2017-04-24 | Discharge: 2017-04-24 | Disposition: A | Discharge status: Home / Self Care | Payer: Medicare Other | Source: Ambulatory Visit | Attending: Radiation Oncology | Admitting: Radiation Oncology

## 2017-04-24 ENCOUNTER — Telehealth: Payer: Self-pay | Admitting: Radiation Oncology

## 2017-04-24 DIAGNOSIS — Z803 Family history of malignant neoplasm of breast: Secondary | ICD-10-CM | POA: Diagnosis not present

## 2017-04-24 DIAGNOSIS — G8929 Other chronic pain: Secondary | ICD-10-CM | POA: Diagnosis not present

## 2017-04-24 DIAGNOSIS — C7931 Secondary malignant neoplasm of brain: Secondary | ICD-10-CM | POA: Diagnosis not present

## 2017-04-24 DIAGNOSIS — Z79899 Other long term (current) drug therapy: Secondary | ICD-10-CM | POA: Diagnosis not present

## 2017-04-24 DIAGNOSIS — K219 Gastro-esophageal reflux disease without esophagitis: Secondary | ICD-10-CM | POA: Diagnosis not present

## 2017-04-24 DIAGNOSIS — Z96641 Presence of right artificial hip joint: Secondary | ICD-10-CM | POA: Diagnosis not present

## 2017-04-24 DIAGNOSIS — Z510 Encounter for antineoplastic radiation therapy: Secondary | ICD-10-CM | POA: Diagnosis not present

## 2017-04-24 DIAGNOSIS — M545 Low back pain: Secondary | ICD-10-CM | POA: Diagnosis not present

## 2017-04-24 DIAGNOSIS — M810 Age-related osteoporosis without current pathological fracture: Secondary | ICD-10-CM | POA: Diagnosis not present

## 2017-04-24 DIAGNOSIS — Z87891 Personal history of nicotine dependence: Secondary | ICD-10-CM | POA: Diagnosis not present

## 2017-04-24 DIAGNOSIS — E785 Hyperlipidemia, unspecified: Secondary | ICD-10-CM | POA: Diagnosis not present

## 2017-04-24 DIAGNOSIS — Z7982 Long term (current) use of aspirin: Secondary | ICD-10-CM | POA: Diagnosis not present

## 2017-04-24 DIAGNOSIS — D219 Benign neoplasm of connective and other soft tissue, unspecified: Secondary | ICD-10-CM | POA: Diagnosis not present

## 2017-04-24 DIAGNOSIS — Z800 Family history of malignant neoplasm of digestive organs: Secondary | ICD-10-CM | POA: Diagnosis not present

## 2017-04-24 DIAGNOSIS — I10 Essential (primary) hypertension: Secondary | ICD-10-CM | POA: Diagnosis not present

## 2017-04-24 DIAGNOSIS — J449 Chronic obstructive pulmonary disease, unspecified: Secondary | ICD-10-CM | POA: Diagnosis not present

## 2017-04-24 DIAGNOSIS — C771 Secondary and unspecified malignant neoplasm of intrathoracic lymph nodes: Secondary | ICD-10-CM | POA: Diagnosis not present

## 2017-04-24 DIAGNOSIS — C3412 Malignant neoplasm of upper lobe, left bronchus or lung: Secondary | ICD-10-CM | POA: Diagnosis not present

## 2017-04-24 NOTE — Progress Notes (Signed)
  Radiation Oncology         [phone] ________________________________  Simulation Procedure Note  Name: Jacqueline Kidd MRN: [id number]  Date: 04/24/2017  DOB: 02/21/1941  Patient re-scanned for planning adjustment.  ________________________________  Sheral Apley. [name], M.D.  This document serves as a record of services personally performed by Tyler Pita, MD. It was created on his behalf by Rae Lips, a trained medical scribe. The creation of this record is based on the scribe's personal observations and the provider's statements to them. This document has been checked and approved by the attending provider.

## 2017-04-24 NOTE — Telephone Encounter (Signed)
Received patient in the clinic for PUT appointment with Dr. [name]. Patient reports [facility] in [place] denies receiving lidocaine script. Phoned pharmacy to inquire. Despite epic confirmation of receipt pharmacy denies having script on file. Provided pharmacist, [place], with verbal order. Explains patient plans to pick up script within the hour.

## 2017-04-25 ENCOUNTER — Ambulatory Visit: Payer: Medicare Other

## 2017-04-27 ENCOUNTER — Ambulatory Visit
Admission: RE | Admit: 2017-04-27 | Discharge: 2017-04-27 | Disposition: A | Discharge status: Home / Self Care | Payer: Medicare Other | Source: Ambulatory Visit | Attending: Radiation Oncology | Admitting: Radiation Oncology

## 2017-04-27 ENCOUNTER — Telehealth: Payer: Self-pay | Admitting: Radiation Oncology

## 2017-04-27 DIAGNOSIS — Z803 Family history of malignant neoplasm of breast: Secondary | ICD-10-CM | POA: Diagnosis not present

## 2017-04-27 DIAGNOSIS — C7931 Secondary malignant neoplasm of brain: Secondary | ICD-10-CM | POA: Diagnosis not present

## 2017-04-27 DIAGNOSIS — Z510 Encounter for antineoplastic radiation therapy: Secondary | ICD-10-CM | POA: Diagnosis not present

## 2017-04-27 DIAGNOSIS — M810 Age-related osteoporosis without current pathological fracture: Secondary | ICD-10-CM | POA: Diagnosis not present

## 2017-04-27 DIAGNOSIS — Z7982 Long term (current) use of aspirin: Secondary | ICD-10-CM | POA: Diagnosis not present

## 2017-04-27 DIAGNOSIS — E785 Hyperlipidemia, unspecified: Secondary | ICD-10-CM | POA: Diagnosis not present

## 2017-04-27 DIAGNOSIS — I10 Essential (primary) hypertension: Secondary | ICD-10-CM | POA: Diagnosis not present

## 2017-04-27 DIAGNOSIS — Z96641 Presence of right artificial hip joint: Secondary | ICD-10-CM | POA: Diagnosis not present

## 2017-04-27 DIAGNOSIS — G8929 Other chronic pain: Secondary | ICD-10-CM | POA: Diagnosis not present

## 2017-04-27 DIAGNOSIS — Z87891 Personal history of nicotine dependence: Secondary | ICD-10-CM | POA: Diagnosis not present

## 2017-04-27 DIAGNOSIS — J449 Chronic obstructive pulmonary disease, unspecified: Secondary | ICD-10-CM | POA: Diagnosis not present

## 2017-04-27 DIAGNOSIS — C771 Secondary and unspecified malignant neoplasm of intrathoracic lymph nodes: Secondary | ICD-10-CM | POA: Diagnosis not present

## 2017-04-27 DIAGNOSIS — M545 Low back pain: Secondary | ICD-10-CM | POA: Diagnosis not present

## 2017-04-27 DIAGNOSIS — D219 Benign neoplasm of connective and other soft tissue, unspecified: Secondary | ICD-10-CM | POA: Diagnosis not present

## 2017-04-27 DIAGNOSIS — Z79899 Other long term (current) drug therapy: Secondary | ICD-10-CM | POA: Diagnosis not present

## 2017-04-27 DIAGNOSIS — C3412 Malignant neoplasm of upper lobe, left bronchus or lung: Secondary | ICD-10-CM | POA: Diagnosis not present

## 2017-04-27 DIAGNOSIS — K219 Gastro-esophageal reflux disease without esophagitis: Secondary | ICD-10-CM | POA: Diagnosis not present

## 2017-04-27 DIAGNOSIS — Z800 Family history of malignant neoplasm of digestive organs: Secondary | ICD-10-CM | POA: Diagnosis not present

## 2017-04-27 NOTE — Telephone Encounter (Signed)
Received voicemail message from patient's daughter in law, Carter Kitten, inquiring if it would be OK if the patient took Mylanta to aid in relief of esophagitis. Phoned back and explained that Mylanta is fine and could be mixed with the prescription lidocaine for addition relief. She verbalized understanding and expressed appreciation for the return call.

## 2017-04-28 ENCOUNTER — Ambulatory Visit
Admission: RE | Admit: 2017-04-28 | Discharge: 2017-04-28 | Disposition: A | Discharge status: Home / Self Care | Payer: Medicare Other | Source: Ambulatory Visit | Attending: Radiation Oncology | Admitting: Radiation Oncology

## 2017-04-28 DIAGNOSIS — M810 Age-related osteoporosis without current pathological fracture: Secondary | ICD-10-CM | POA: Diagnosis not present

## 2017-04-28 DIAGNOSIS — M545 Low back pain: Secondary | ICD-10-CM | POA: Diagnosis not present

## 2017-04-28 DIAGNOSIS — C771 Secondary and unspecified malignant neoplasm of intrathoracic lymph nodes: Secondary | ICD-10-CM | POA: Diagnosis not present

## 2017-04-28 DIAGNOSIS — G8929 Other chronic pain: Secondary | ICD-10-CM | POA: Diagnosis not present

## 2017-04-28 DIAGNOSIS — Z96641 Presence of right artificial hip joint: Secondary | ICD-10-CM | POA: Diagnosis not present

## 2017-04-28 DIAGNOSIS — C3412 Malignant neoplasm of upper lobe, left bronchus or lung: Secondary | ICD-10-CM | POA: Diagnosis not present

## 2017-04-28 DIAGNOSIS — I10 Essential (primary) hypertension: Secondary | ICD-10-CM | POA: Diagnosis not present

## 2017-04-28 DIAGNOSIS — E785 Hyperlipidemia, unspecified: Secondary | ICD-10-CM | POA: Diagnosis not present

## 2017-04-28 DIAGNOSIS — Z79899 Other long term (current) drug therapy: Secondary | ICD-10-CM | POA: Diagnosis not present

## 2017-04-28 DIAGNOSIS — D219 Benign neoplasm of connective and other soft tissue, unspecified: Secondary | ICD-10-CM | POA: Diagnosis not present

## 2017-04-28 DIAGNOSIS — K219 Gastro-esophageal reflux disease without esophagitis: Secondary | ICD-10-CM | POA: Diagnosis not present

## 2017-04-28 DIAGNOSIS — Z7982 Long term (current) use of aspirin: Secondary | ICD-10-CM | POA: Diagnosis not present

## 2017-04-28 DIAGNOSIS — Z510 Encounter for antineoplastic radiation therapy: Secondary | ICD-10-CM | POA: Diagnosis not present

## 2017-04-28 DIAGNOSIS — Z87891 Personal history of nicotine dependence: Secondary | ICD-10-CM | POA: Diagnosis not present

## 2017-04-28 DIAGNOSIS — J449 Chronic obstructive pulmonary disease, unspecified: Secondary | ICD-10-CM | POA: Diagnosis not present

## 2017-04-28 DIAGNOSIS — Z800 Family history of malignant neoplasm of digestive organs: Secondary | ICD-10-CM | POA: Diagnosis not present

## 2017-04-28 DIAGNOSIS — Z803 Family history of malignant neoplasm of breast: Secondary | ICD-10-CM | POA: Diagnosis not present

## 2017-04-28 DIAGNOSIS — C7931 Secondary malignant neoplasm of brain: Secondary | ICD-10-CM | POA: Diagnosis not present

## 2017-04-29 ENCOUNTER — Other Ambulatory Visit (HOSPITAL_BASED_OUTPATIENT_CLINIC_OR_DEPARTMENT_OTHER): Payer: Medicare Other

## 2017-04-29 ENCOUNTER — Ambulatory Visit
Admission: RE | Admit: 2017-04-29 | Discharge: 2017-04-29 | Disposition: A | Discharge status: Home / Self Care | Payer: Medicare Other | Source: Ambulatory Visit | Attending: Radiation Oncology | Admitting: Radiation Oncology

## 2017-04-29 DIAGNOSIS — C7931 Secondary malignant neoplasm of brain: Secondary | ICD-10-CM | POA: Diagnosis not present

## 2017-04-29 DIAGNOSIS — I10 Essential (primary) hypertension: Secondary | ICD-10-CM | POA: Diagnosis not present

## 2017-04-29 DIAGNOSIS — Z87891 Personal history of nicotine dependence: Secondary | ICD-10-CM | POA: Diagnosis not present

## 2017-04-29 DIAGNOSIS — E785 Hyperlipidemia, unspecified: Secondary | ICD-10-CM | POA: Diagnosis not present

## 2017-04-29 DIAGNOSIS — C3412 Malignant neoplasm of upper lobe, left bronchus or lung: Secondary | ICD-10-CM | POA: Diagnosis not present

## 2017-04-29 DIAGNOSIS — Z79899 Other long term (current) drug therapy: Secondary | ICD-10-CM | POA: Diagnosis not present

## 2017-04-29 DIAGNOSIS — M810 Age-related osteoporosis without current pathological fracture: Secondary | ICD-10-CM | POA: Diagnosis not present

## 2017-04-29 DIAGNOSIS — D219 Benign neoplasm of connective and other soft tissue, unspecified: Secondary | ICD-10-CM | POA: Diagnosis not present

## 2017-04-29 DIAGNOSIS — C3492 Malignant neoplasm of unspecified part of left bronchus or lung: Secondary | ICD-10-CM

## 2017-04-29 DIAGNOSIS — C771 Secondary and unspecified malignant neoplasm of intrathoracic lymph nodes: Secondary | ICD-10-CM | POA: Diagnosis not present

## 2017-04-29 DIAGNOSIS — Z96641 Presence of right artificial hip joint: Secondary | ICD-10-CM | POA: Diagnosis not present

## 2017-04-29 DIAGNOSIS — Z800 Family history of malignant neoplasm of digestive organs: Secondary | ICD-10-CM | POA: Diagnosis not present

## 2017-04-29 DIAGNOSIS — G8929 Other chronic pain: Secondary | ICD-10-CM | POA: Diagnosis not present

## 2017-04-29 DIAGNOSIS — M545 Low back pain: Secondary | ICD-10-CM | POA: Diagnosis not present

## 2017-04-29 DIAGNOSIS — Z7982 Long term (current) use of aspirin: Secondary | ICD-10-CM | POA: Diagnosis not present

## 2017-04-29 DIAGNOSIS — K219 Gastro-esophageal reflux disease without esophagitis: Secondary | ICD-10-CM | POA: Diagnosis not present

## 2017-04-29 DIAGNOSIS — Z803 Family history of malignant neoplasm of breast: Secondary | ICD-10-CM | POA: Diagnosis not present

## 2017-04-29 DIAGNOSIS — Z510 Encounter for antineoplastic radiation therapy: Secondary | ICD-10-CM | POA: Diagnosis not present

## 2017-04-29 DIAGNOSIS — J449 Chronic obstructive pulmonary disease, unspecified: Secondary | ICD-10-CM | POA: Diagnosis not present

## 2017-04-29 LAB — COMPREHENSIVE METABOLIC PANEL
ALK PHOS: 98 U/L (ref 40–150)
ALT: 17 U/L (ref 0–55)
AST: 20 U/L (ref 5–34)
Albumin: 3.1 g/dL — ABNORMAL LOW (ref 3.5–5.0)
Anion Gap: 11 mEq/L (ref 3–11)
BUN: 11.8 mg/dL (ref 7.0–26.0)
CALCIUM: 9.2 mg/dL (ref 8.4–10.4)
CO2: 27 meq/L (ref 22–29)
Chloride: 100 mEq/L (ref 98–109)
Creatinine: 0.7 mg/dL (ref 0.6–1.1)
EGFR: >60 mL/min/{1.73_m2} (ref >60)
GLUCOSE: 110 mg/dL (ref 70–140)
POTASSIUM: 3.4 meq/L — AB (ref 3.5–5.1)
SODIUM: 138 meq/L (ref 136–145)
Total Bilirubin: 0.82 mg/dL (ref 0.20–1.20)
Total Protein: 7.4 g/dL (ref 6.4–8.3)

## 2017-04-29 LAB — CBC WITH DIFFERENTIAL/PLATELET
BASO%: 0.4 % (ref 0.0–2.0)
BASOS ABS: 0 10*3/uL (ref 0.0–0.1)
EOS%: 0.8 % (ref 0.0–7.0)
Eosinophils Absolute: 0 10*3/uL (ref 0.0–0.5)
HCT: 35.7 % (ref 34.8–46.6)
HGB: 12.4 g/dL (ref 11.6–15.9)
LYMPH%: 13.6 % — AB (ref 14.0–49.7)
MCH: 32 pg (ref 25.1–34.0)
MCHC: 34.6 g/dL (ref 31.5–36.0)
MCV: 92.5 fL (ref 79.5–101.0)
MONO#: 0.6 10*3/uL (ref 0.1–0.9)
MONO%: 24.9 % — AB (ref 0.0–14.0)
NEUT#: 1.6 10*3/uL (ref 1.5–6.5)
NEUT%: 60.3 % (ref 38.4–76.8)
Platelets: 155 10*3/uL (ref 145–400)
RBC: 3.85 10*6/uL (ref 3.70–5.45)
RDW: 14.1 % (ref 11.2–14.5)
WBC: 2.6 10*3/uL — ABNORMAL LOW (ref 3.9–10.3)
lymph#: 0.4 10*3/uL — ABNORMAL LOW (ref 0.9–3.3)

## 2017-04-30 ENCOUNTER — Ambulatory Visit
Admission: RE | Admit: 2017-04-30 | Discharge: 2017-04-30 | Disposition: A | Discharge status: Home / Self Care | Payer: Medicare Other | Source: Ambulatory Visit | Attending: Radiation Oncology | Admitting: Radiation Oncology

## 2017-04-30 DIAGNOSIS — M810 Age-related osteoporosis without current pathological fracture: Secondary | ICD-10-CM | POA: Diagnosis not present

## 2017-04-30 DIAGNOSIS — Z79899 Other long term (current) drug therapy: Secondary | ICD-10-CM | POA: Diagnosis not present

## 2017-04-30 DIAGNOSIS — C7931 Secondary malignant neoplasm of brain: Secondary | ICD-10-CM | POA: Diagnosis not present

## 2017-04-30 DIAGNOSIS — Z510 Encounter for antineoplastic radiation therapy: Secondary | ICD-10-CM | POA: Diagnosis not present

## 2017-04-30 DIAGNOSIS — I10 Essential (primary) hypertension: Secondary | ICD-10-CM | POA: Diagnosis not present

## 2017-04-30 DIAGNOSIS — Z7982 Long term (current) use of aspirin: Secondary | ICD-10-CM | POA: Diagnosis not present

## 2017-04-30 DIAGNOSIS — Z87891 Personal history of nicotine dependence: Secondary | ICD-10-CM | POA: Diagnosis not present

## 2017-04-30 DIAGNOSIS — C3412 Malignant neoplasm of upper lobe, left bronchus or lung: Secondary | ICD-10-CM | POA: Diagnosis not present

## 2017-04-30 DIAGNOSIS — Z800 Family history of malignant neoplasm of digestive organs: Secondary | ICD-10-CM | POA: Diagnosis not present

## 2017-04-30 DIAGNOSIS — M545 Low back pain: Secondary | ICD-10-CM | POA: Diagnosis not present

## 2017-04-30 DIAGNOSIS — Z803 Family history of malignant neoplasm of breast: Secondary | ICD-10-CM | POA: Diagnosis not present

## 2017-04-30 DIAGNOSIS — Z96641 Presence of right artificial hip joint: Secondary | ICD-10-CM | POA: Diagnosis not present

## 2017-04-30 DIAGNOSIS — J449 Chronic obstructive pulmonary disease, unspecified: Secondary | ICD-10-CM | POA: Diagnosis not present

## 2017-04-30 DIAGNOSIS — K219 Gastro-esophageal reflux disease without esophagitis: Secondary | ICD-10-CM | POA: Diagnosis not present

## 2017-04-30 DIAGNOSIS — D219 Benign neoplasm of connective and other soft tissue, unspecified: Secondary | ICD-10-CM | POA: Diagnosis not present

## 2017-04-30 DIAGNOSIS — C771 Secondary and unspecified malignant neoplasm of intrathoracic lymph nodes: Secondary | ICD-10-CM | POA: Diagnosis not present

## 2017-04-30 DIAGNOSIS — G8929 Other chronic pain: Secondary | ICD-10-CM | POA: Diagnosis not present

## 2017-04-30 DIAGNOSIS — E785 Hyperlipidemia, unspecified: Secondary | ICD-10-CM | POA: Diagnosis not present

## 2017-05-01 ENCOUNTER — Ambulatory Visit: Payer: Medicare Other

## 2017-05-01 ENCOUNTER — Ambulatory Visit
Admission: RE | Admit: 2017-05-01 | Discharge: 2017-05-01 | Disposition: A | Discharge status: Home / Self Care | Payer: Medicare Other | Source: Ambulatory Visit | Attending: Radiation Oncology | Admitting: Radiation Oncology

## 2017-05-01 DIAGNOSIS — I10 Essential (primary) hypertension: Secondary | ICD-10-CM | POA: Diagnosis not present

## 2017-05-01 DIAGNOSIS — Z87891 Personal history of nicotine dependence: Secondary | ICD-10-CM | POA: Diagnosis not present

## 2017-05-01 DIAGNOSIS — C3412 Malignant neoplasm of upper lobe, left bronchus or lung: Secondary | ICD-10-CM | POA: Diagnosis not present

## 2017-05-01 DIAGNOSIS — Z7982 Long term (current) use of aspirin: Secondary | ICD-10-CM | POA: Diagnosis not present

## 2017-05-01 DIAGNOSIS — Z800 Family history of malignant neoplasm of digestive organs: Secondary | ICD-10-CM | POA: Diagnosis not present

## 2017-05-01 DIAGNOSIS — Z96641 Presence of right artificial hip joint: Secondary | ICD-10-CM | POA: Diagnosis not present

## 2017-05-01 DIAGNOSIS — D219 Benign neoplasm of connective and other soft tissue, unspecified: Secondary | ICD-10-CM | POA: Diagnosis not present

## 2017-05-01 DIAGNOSIS — Z510 Encounter for antineoplastic radiation therapy: Secondary | ICD-10-CM | POA: Diagnosis not present

## 2017-05-01 DIAGNOSIS — C771 Secondary and unspecified malignant neoplasm of intrathoracic lymph nodes: Secondary | ICD-10-CM | POA: Diagnosis not present

## 2017-05-01 DIAGNOSIS — C7931 Secondary malignant neoplasm of brain: Secondary | ICD-10-CM | POA: Diagnosis not present

## 2017-05-01 DIAGNOSIS — G8929 Other chronic pain: Secondary | ICD-10-CM | POA: Diagnosis not present

## 2017-05-01 DIAGNOSIS — J449 Chronic obstructive pulmonary disease, unspecified: Secondary | ICD-10-CM | POA: Diagnosis not present

## 2017-05-01 DIAGNOSIS — E785 Hyperlipidemia, unspecified: Secondary | ICD-10-CM | POA: Diagnosis not present

## 2017-05-01 DIAGNOSIS — K219 Gastro-esophageal reflux disease without esophagitis: Secondary | ICD-10-CM | POA: Diagnosis not present

## 2017-05-01 DIAGNOSIS — M810 Age-related osteoporosis without current pathological fracture: Secondary | ICD-10-CM | POA: Diagnosis not present

## 2017-05-01 DIAGNOSIS — Z803 Family history of malignant neoplasm of breast: Secondary | ICD-10-CM | POA: Diagnosis not present

## 2017-05-01 DIAGNOSIS — M545 Low back pain: Secondary | ICD-10-CM | POA: Diagnosis not present

## 2017-05-01 DIAGNOSIS — Z79899 Other long term (current) drug therapy: Secondary | ICD-10-CM | POA: Diagnosis not present

## 2017-05-04 ENCOUNTER — Ambulatory Visit
Admission: RE | Admit: 2017-05-04 | Discharge: 2017-05-04 | Disposition: A | Discharge status: Home / Self Care | Payer: Medicare Other | Source: Ambulatory Visit | Attending: Radiation Oncology | Admitting: Radiation Oncology

## 2017-05-04 DIAGNOSIS — Z79899 Other long term (current) drug therapy: Secondary | ICD-10-CM | POA: Diagnosis not present

## 2017-05-04 DIAGNOSIS — I10 Essential (primary) hypertension: Secondary | ICD-10-CM | POA: Diagnosis not present

## 2017-05-04 DIAGNOSIS — Z803 Family history of malignant neoplasm of breast: Secondary | ICD-10-CM | POA: Diagnosis not present

## 2017-05-04 DIAGNOSIS — G8929 Other chronic pain: Secondary | ICD-10-CM | POA: Diagnosis not present

## 2017-05-04 DIAGNOSIS — Z800 Family history of malignant neoplasm of digestive organs: Secondary | ICD-10-CM | POA: Diagnosis not present

## 2017-05-04 DIAGNOSIS — Z7982 Long term (current) use of aspirin: Secondary | ICD-10-CM | POA: Diagnosis not present

## 2017-05-04 DIAGNOSIS — M545 Low back pain: Secondary | ICD-10-CM | POA: Diagnosis not present

## 2017-05-04 DIAGNOSIS — M810 Age-related osteoporosis without current pathological fracture: Secondary | ICD-10-CM | POA: Diagnosis not present

## 2017-05-04 DIAGNOSIS — Z96641 Presence of right artificial hip joint: Secondary | ICD-10-CM | POA: Diagnosis not present

## 2017-05-04 DIAGNOSIS — E785 Hyperlipidemia, unspecified: Secondary | ICD-10-CM | POA: Diagnosis not present

## 2017-05-04 DIAGNOSIS — K219 Gastro-esophageal reflux disease without esophagitis: Secondary | ICD-10-CM | POA: Diagnosis not present

## 2017-05-04 DIAGNOSIS — C3412 Malignant neoplasm of upper lobe, left bronchus or lung: Secondary | ICD-10-CM | POA: Diagnosis not present

## 2017-05-04 DIAGNOSIS — D219 Benign neoplasm of connective and other soft tissue, unspecified: Secondary | ICD-10-CM | POA: Diagnosis not present

## 2017-05-04 DIAGNOSIS — C7931 Secondary malignant neoplasm of brain: Secondary | ICD-10-CM | POA: Diagnosis not present

## 2017-05-04 DIAGNOSIS — J449 Chronic obstructive pulmonary disease, unspecified: Secondary | ICD-10-CM | POA: Diagnosis not present

## 2017-05-04 DIAGNOSIS — C771 Secondary and unspecified malignant neoplasm of intrathoracic lymph nodes: Secondary | ICD-10-CM | POA: Diagnosis not present

## 2017-05-04 DIAGNOSIS — Z87891 Personal history of nicotine dependence: Secondary | ICD-10-CM | POA: Diagnosis not present

## 2017-05-04 DIAGNOSIS — Z510 Encounter for antineoplastic radiation therapy: Secondary | ICD-10-CM | POA: Diagnosis not present

## 2017-05-05 ENCOUNTER — Ambulatory Visit
Admission: RE | Admit: 2017-05-05 | Discharge: 2017-05-05 | Disposition: A | Discharge status: Home / Self Care | Payer: Medicare Other | Source: Ambulatory Visit | Attending: Radiation Oncology | Admitting: Radiation Oncology

## 2017-05-05 DIAGNOSIS — C3412 Malignant neoplasm of upper lobe, left bronchus or lung: Secondary | ICD-10-CM | POA: Diagnosis not present

## 2017-05-05 DIAGNOSIS — E785 Hyperlipidemia, unspecified: Secondary | ICD-10-CM | POA: Diagnosis not present

## 2017-05-05 DIAGNOSIS — C771 Secondary and unspecified malignant neoplasm of intrathoracic lymph nodes: Secondary | ICD-10-CM | POA: Diagnosis not present

## 2017-05-05 DIAGNOSIS — J449 Chronic obstructive pulmonary disease, unspecified: Secondary | ICD-10-CM | POA: Diagnosis not present

## 2017-05-05 DIAGNOSIS — M810 Age-related osteoporosis without current pathological fracture: Secondary | ICD-10-CM | POA: Diagnosis not present

## 2017-05-05 DIAGNOSIS — Z7982 Long term (current) use of aspirin: Secondary | ICD-10-CM | POA: Diagnosis not present

## 2017-05-05 DIAGNOSIS — C7931 Secondary malignant neoplasm of brain: Secondary | ICD-10-CM | POA: Diagnosis not present

## 2017-05-05 DIAGNOSIS — Z510 Encounter for antineoplastic radiation therapy: Secondary | ICD-10-CM | POA: Diagnosis not present

## 2017-05-05 DIAGNOSIS — D219 Benign neoplasm of connective and other soft tissue, unspecified: Secondary | ICD-10-CM | POA: Diagnosis not present

## 2017-05-05 DIAGNOSIS — Z87891 Personal history of nicotine dependence: Secondary | ICD-10-CM | POA: Diagnosis not present

## 2017-05-05 DIAGNOSIS — Z79899 Other long term (current) drug therapy: Secondary | ICD-10-CM | POA: Diagnosis not present

## 2017-05-05 DIAGNOSIS — Z803 Family history of malignant neoplasm of breast: Secondary | ICD-10-CM | POA: Diagnosis not present

## 2017-05-05 DIAGNOSIS — G8929 Other chronic pain: Secondary | ICD-10-CM | POA: Diagnosis not present

## 2017-05-05 DIAGNOSIS — Z96641 Presence of right artificial hip joint: Secondary | ICD-10-CM | POA: Diagnosis not present

## 2017-05-05 DIAGNOSIS — I10 Essential (primary) hypertension: Secondary | ICD-10-CM | POA: Diagnosis not present

## 2017-05-05 DIAGNOSIS — K219 Gastro-esophageal reflux disease without esophagitis: Secondary | ICD-10-CM | POA: Diagnosis not present

## 2017-05-05 DIAGNOSIS — M545 Low back pain: Secondary | ICD-10-CM | POA: Diagnosis not present

## 2017-05-05 DIAGNOSIS — Z800 Family history of malignant neoplasm of digestive organs: Secondary | ICD-10-CM | POA: Diagnosis not present

## 2017-05-06 ENCOUNTER — Ambulatory Visit (HOSPITAL_BASED_OUTPATIENT_CLINIC_OR_DEPARTMENT_OTHER): Payer: Medicare Other

## 2017-05-06 ENCOUNTER — Encounter: Payer: Self-pay | Admitting: Radiation Oncology

## 2017-05-06 ENCOUNTER — Encounter: Payer: Self-pay | Admitting: Oncology

## 2017-05-06 ENCOUNTER — Ambulatory Visit: Payer: Medicare Other

## 2017-05-06 ENCOUNTER — Other Ambulatory Visit (HOSPITAL_BASED_OUTPATIENT_CLINIC_OR_DEPARTMENT_OTHER): Payer: Medicare Other

## 2017-05-06 ENCOUNTER — Ambulatory Visit (HOSPITAL_BASED_OUTPATIENT_CLINIC_OR_DEPARTMENT_OTHER): Payer: Medicare Other | Admitting: Oncology

## 2017-05-06 ENCOUNTER — Ambulatory Visit
Admission: RE | Admit: 2017-05-06 | Discharge: 2017-05-06 | Disposition: A | Discharge status: Home / Self Care | Payer: Medicare Other | Source: Ambulatory Visit | Attending: Radiation Oncology | Admitting: Radiation Oncology

## 2017-05-06 VITALS — HR 85

## 2017-05-06 VITALS — BP 118/56 | HR 103 | Temp 98.0°F | Resp 18 | Ht 61.5 in | Wt 151.0 lb

## 2017-05-06 DIAGNOSIS — R05 Cough: Secondary | ICD-10-CM

## 2017-05-06 DIAGNOSIS — M545 Low back pain: Secondary | ICD-10-CM | POA: Diagnosis not present

## 2017-05-06 DIAGNOSIS — Z5111 Encounter for antineoplastic chemotherapy: Secondary | ICD-10-CM

## 2017-05-06 DIAGNOSIS — C778 Secondary and unspecified malignant neoplasm of lymph nodes of multiple regions: Secondary | ICD-10-CM

## 2017-05-06 DIAGNOSIS — C7931 Secondary malignant neoplasm of brain: Secondary | ICD-10-CM | POA: Diagnosis not present

## 2017-05-06 DIAGNOSIS — C3412 Malignant neoplasm of upper lobe, left bronchus or lung: Secondary | ICD-10-CM

## 2017-05-06 DIAGNOSIS — Z510 Encounter for antineoplastic radiation therapy: Secondary | ICD-10-CM | POA: Diagnosis not present

## 2017-05-06 DIAGNOSIS — Z800 Family history of malignant neoplasm of digestive organs: Secondary | ICD-10-CM | POA: Diagnosis not present

## 2017-05-06 DIAGNOSIS — E785 Hyperlipidemia, unspecified: Secondary | ICD-10-CM | POA: Diagnosis not present

## 2017-05-06 DIAGNOSIS — J449 Chronic obstructive pulmonary disease, unspecified: Secondary | ICD-10-CM | POA: Diagnosis not present

## 2017-05-06 DIAGNOSIS — C3492 Malignant neoplasm of unspecified part of left bronchus or lung: Secondary | ICD-10-CM

## 2017-05-06 DIAGNOSIS — T281XXA Burn of esophagus, initial encounter: Secondary | ICD-10-CM | POA: Diagnosis not present

## 2017-05-06 DIAGNOSIS — R110 Nausea: Secondary | ICD-10-CM | POA: Diagnosis not present

## 2017-05-06 DIAGNOSIS — K219 Gastro-esophageal reflux disease without esophagitis: Secondary | ICD-10-CM | POA: Diagnosis not present

## 2017-05-06 DIAGNOSIS — Z803 Family history of malignant neoplasm of breast: Secondary | ICD-10-CM | POA: Diagnosis not present

## 2017-05-06 DIAGNOSIS — C771 Secondary and unspecified malignant neoplasm of intrathoracic lymph nodes: Secondary | ICD-10-CM | POA: Diagnosis not present

## 2017-05-06 DIAGNOSIS — M810 Age-related osteoporosis without current pathological fracture: Secondary | ICD-10-CM | POA: Diagnosis not present

## 2017-05-06 DIAGNOSIS — Z87891 Personal history of nicotine dependence: Secondary | ICD-10-CM | POA: Diagnosis not present

## 2017-05-06 DIAGNOSIS — I10 Essential (primary) hypertension: Secondary | ICD-10-CM | POA: Diagnosis not present

## 2017-05-06 DIAGNOSIS — Z96641 Presence of right artificial hip joint: Secondary | ICD-10-CM | POA: Diagnosis not present

## 2017-05-06 DIAGNOSIS — G8929 Other chronic pain: Secondary | ICD-10-CM | POA: Diagnosis not present

## 2017-05-06 DIAGNOSIS — Z79899 Other long term (current) drug therapy: Secondary | ICD-10-CM | POA: Diagnosis not present

## 2017-05-06 DIAGNOSIS — D219 Benign neoplasm of connective and other soft tissue, unspecified: Secondary | ICD-10-CM | POA: Diagnosis not present

## 2017-05-06 DIAGNOSIS — K5900 Constipation, unspecified: Secondary | ICD-10-CM

## 2017-05-06 DIAGNOSIS — Z7982 Long term (current) use of aspirin: Secondary | ICD-10-CM | POA: Diagnosis not present

## 2017-05-06 LAB — CBC WITH DIFFERENTIAL/PLATELET
BASO%: 0.3 % (ref 0.0–2.0)
Basophils Absolute: 0 10*3/uL (ref 0.0–0.1)
EOS ABS: 0 10*3/uL (ref 0.0–0.5)
EOS%: 0.3 % (ref 0.0–7.0)
HCT: 37.9 % (ref 34.8–46.6)
HEMOGLOBIN: 12.7 g/dL (ref 11.6–15.9)
LYMPH%: 18.5 % (ref 14.0–49.7)
MCH: 31.8 pg (ref 25.1–34.0)
MCHC: 33.5 g/dL (ref 31.5–36.0)
MCV: 95 fL (ref 79.5–101.0)
MONO#: 0.4 10*3/uL (ref 0.1–0.9)
MONO%: 9.6 % (ref 0.0–14.0)
NEUT%: 71.3 % (ref 38.4–76.8)
NEUTROS ABS: 2.7 10*3/uL (ref 1.5–6.5)
Platelets: 139 10*3/uL — ABNORMAL LOW (ref 145–400)
RBC: 3.99 10*6/uL (ref 3.70–5.45)
RDW: 15.1 % — AB (ref 11.2–14.5)
WBC: 3.8 10*3/uL — AB (ref 3.9–10.3)
lymph#: 0.7 10*3/uL — ABNORMAL LOW (ref 0.9–3.3)

## 2017-05-06 LAB — COMPREHENSIVE METABOLIC PANEL
ALK PHOS: 119 U/L (ref 40–150)
ALT: 17 U/L (ref 0–55)
AST: 23 U/L (ref 5–34)
Albumin: 3.1 g/dL — ABNORMAL LOW (ref 3.5–5.0)
Anion Gap: 9 mEq/L (ref 3–11)
BILIRUBIN TOTAL: 0.95 mg/dL (ref 0.20–1.20)
BUN: 8 mg/dL (ref 7.0–26.0)
CO2: 25 meq/L (ref 22–29)
Calcium: 9.1 mg/dL (ref 8.4–10.4)
Chloride: 103 mEq/L (ref 98–109)
Creatinine: 0.7 mg/dL (ref 0.6–1.1)
GLUCOSE: 114 mg/dL (ref 70–140)
Potassium: 3.4 mEq/L — ABNORMAL LOW (ref 3.5–5.1)
SODIUM: 137 meq/L (ref 136–145)
TOTAL PROTEIN: 7.4 g/dL (ref 6.4–8.3)

## 2017-05-06 MED ORDER — DIPHENHYDRAMINE HCL 50 MG/ML IJ SOLN
INTRAMUSCULAR | Status: AC
  Filled 2017-05-06: qty 1

## 2017-05-06 MED ORDER — PALONOSETRON HCL INJECTION 0.25 MG/5ML
0.2500 mg | Freq: Once | INTRAVENOUS | Status: AC
  Administered 2017-05-06: 0.25 mg via INTRAVENOUS

## 2017-05-06 MED ORDER — PACLITAXEL CHEMO INJECTION 300 MG/50ML
165.0000 mg/m2 | Freq: Once | INTRAVENOUS | Status: AC
  Administered 2017-05-06: 300 mg via INTRAVENOUS
  Filled 2017-05-06: qty 50

## 2017-05-06 MED ORDER — PALONOSETRON HCL INJECTION 0.25 MG/5ML
INTRAVENOUS | Status: AC
  Filled 2017-05-06: qty 5

## 2017-05-06 MED ORDER — DIPHENHYDRAMINE HCL 50 MG/ML IJ SOLN
50.0000 mg | Freq: Once | INTRAMUSCULAR | Status: AC
  Administered 2017-05-06: 50 mg via INTRAVENOUS

## 2017-05-06 MED ORDER — CARBOPLATIN CHEMO INJECTION 600 MG/60ML
404.5000 mg | Freq: Once | INTRAVENOUS | Status: AC
  Administered 2017-05-06: 400 mg via INTRAVENOUS
  Filled 2017-05-06: qty 40

## 2017-05-06 MED ORDER — SODIUM CHLORIDE 0.9 % IV SOLN
Freq: Once | INTRAVENOUS | Status: AC
  Administered 2017-05-06: 10:00:00 via INTRAVENOUS

## 2017-05-06 MED ORDER — SODIUM CHLORIDE 0.9 % IV SOLN
20.0000 mg | Freq: Once | INTRAVENOUS | Status: AC
  Administered 2017-05-06: 20 mg via INTRAVENOUS
  Filled 2017-05-06: qty 2

## 2017-05-06 MED ORDER — FAMOTIDINE IN NACL 20-0.9 MG/50ML-% IV SOLN
INTRAVENOUS | Status: AC
  Filled 2017-05-06: qty 50

## 2017-05-06 MED ORDER — FAMOTIDINE IN NACL 20-0.9 MG/50ML-% IV SOLN
20.0000 mg | Freq: Once | INTRAVENOUS | Status: AC
  Administered 2017-05-06: 20 mg via INTRAVENOUS

## 2017-05-06 NOTE — Patient Instructions (Addendum)
[facility] Discharge Instructions for Patients Receiving Chemotherapy  Today you received the following chemotherapy agents Carboplatin and Paclitaxel (Taxol)  To help prevent nausea and vomiting after your treatment, we encourage you to take your nausea medication as directed   If you develop nausea and vomiting that is not controlled by your nausea medication, call the clinic.   BELOW ARE SYMPTOMS THAT SHOULD BE REPORTED IMMEDIATELY:  *FEVER GREATER THAN 100.5 F  *CHILLS WITH OR WITHOUT FEVER  NAUSEA AND VOMITING THAT IS NOT CONTROLLED WITH YOUR NAUSEA MEDICATION  *UNUSUAL SHORTNESS OF BREATH  *UNUSUAL BRUISING OR BLEEDING  TENDERNESS IN MOUTH AND THROAT WITH OR WITHOUT PRESENCE OF ULCERS  *URINARY PROBLEMS  *BOWEL PROBLEMS  UNUSUAL RASH Items with * indicate a potential emergency and should be followed up as soon as possible.  Feel free to call the clinic should you have any questions or concerns. The clinic phone number is [phone].  Please show the [place] at check-in to the Emergency Department and triage nurse.

## 2017-05-06 NOTE — Progress Notes (Signed)
[facility] OFFICE PROGRESS NOTE  Jacqueline Kidd, [place] [address]  DIAGNOSIS: Stage IV(T2b, N3, M1c) lung cancer pending further staging workup and tissue diagnosis. The patient presented with large left upper lobe lung mass in addition to left hilar and mediastinal lymphadenopathy and left true vocal cord paralysis.  CURRENT THERAPY:  1) palliative radiotherapy to the large left upper lobe lung mass as well as metastatic brain lesions. 2) systemic chemotherapy with carboplatin for AUC of 5 and paclitaxel 175 MG/M2 every 3 weeks. First dose on 04/15/2017.  INTERVAL HISTORY: Jacqueline Kidd 76 y.o. female returns for routine follow-up with her son. The patient is feeling fine today except burning in her esophagus when she tries to eat. She has been taking in mostly liquids and not really much solid food lately. She vomits when she takes Carafate, but does use lidocaine to help numb her throat and esophagus when she eats. She continues on radiation to her chest which she is due to complete today. Denies fevers and chills. Reports a dry cough. Denies chest pain, shortness of breath, and hemoptysis. She does not have the sensation of nausea, but does not vomit with the Carafate. Denies diarrhea. Reports constipation. She takes 1 Senokot S daily. The patient is here for evaluation prior to cycle 2 of her chemotherapy.    MEDICAL HISTORY: Past Medical History:  Diagnosis Date  . Chronic low back pain   . COPD (chronic obstructive pulmonary disease) ([place])   . GERD (gastroesophageal reflux disease)   . Hyperlipidemia   . Hypertension   . Lesion of left lung   . Osteoporosis     ALLERGIES:  is allergic to fosamax [alendronate]; hydrocodone-acetaminophen; and lisinopril-hydrochlorothiazide.  MEDICATIONS:  Current Outpatient Prescriptions  Medication Sig Dispense Refill  . acetaminophen (TYLENOL) 325 MG tablet Take 650 mg by mouth every 6 (six) hours as  needed for mild pain or moderate pain.     Marland Kitchen amLODipine (NORVASC) 2.5 MG tablet Take 1 tablet (2.5 mg total) by mouth daily. 90 tablet 1  . aspirin EC 81 MG tablet Take 81 mg by mouth daily.    . cholecalciferol (VITAMIN D) 1000 units tablet Take 1,000 Units by mouth daily.    Marland Kitchen lidocaine (XYLOCAINE) 2 % solution Use as directed 5 mLs in the mouth or throat as needed for mouth pain. Mix with 5 mL water in 10 mL syringe and instill past tongue 200 mL 5  . LORazepam (ATIVAN) 0.5 MG tablet Take 1 tablet (0.5 mg total) by mouth as needed for anxiety (30 min prior to radiotherapy treatment; may repeat after 30 min if needed). 2 tablet 0  . Omega-3 Fatty Acids (FISH OIL) 1000 MG CAPS Take 1 capsule by mouth daily.    . prochlorperazine (COMPAZINE) 10 MG tablet Take 1 tablet (10 mg total) by mouth every 6 (six) hours as needed for nausea or vomiting. 30 tablet 1  . sucralfate (CARAFATE) 1 g tablet Take 1 tablet (1 g total) by mouth 4 (four) times daily -  with meals and at bedtime. 5 min before meals for radiation induced esophagitis 120 tablet 2  . traMADol (ULTRAM) 50 MG tablet Take 1 tablet (50 mg total) by mouth every 8 (eight) hours as needed. 15 tablet 0   No current facility-administered medications for this visit.     SURGICAL HISTORY:  Past Surgical History:  Procedure Laterality Date  . ABDOMINAL HYSTERECTOMY    . TOTAL  HIP ARTHROPLASTY     right    REVIEW OF SYSTEMS:   Review of Systems  Constitutional: Negative for chills, fatigue, fever.  Positive for weight loss HENT:   Negative for mouth sores, nosebleeds, sore throat and trouble swallowing.   Eyes: Negative for eye problems and icterus.  Respiratory: Negative for hemoptysis, shortness of breath and wheezing. Positive for dry cough.  Cardiovascular: Negative for chest pain and leg swelling.  Gastrointestinal: Negative for abdominal pain, diarrhea, nausea and vomiting.  Positive for constipation. Genitourinary: Negative for  bladder incontinence, difficulty urinating, dysuria, frequency and hematuria.   Musculoskeletal: Negative for back pain, gait problem, neck pain and neck stiffness.  Skin: Negative for itching and rash.  Neurological: Negative for dizziness, extremity weakness, gait problem, headaches, light-headedness and seizures.  Hematological: Negative for adenopathy. Does not bruise/bleed easily.  Psychiatric/Behavioral: Negative for confusion, depression and sleep disturbance. The patient is not nervous/anxious.     PHYSICAL EXAMINATION:  Blood pressure (!) 118/56, pulse (!) 103, temperature 98 F (36.7 C), temperature source Oral, resp. rate 18, height 5' 1.5" (1.562 m), weight 151 lb (68.5 kg), SpO2 98 %.  ECOG PERFORMANCE STATUS: 1 - Symptomatic but completely ambulatory  Physical Exam  Constitutional: Oriented to person, place, and time and well-developed, well-nourished, and in no distress. No distress.  HENT:  Head: Normocephalic and atraumatic.  Mouth/Throat: Oropharynx is clear and moist. No oropharyngeal exudate.  Eyes: Conjunctivae are normal. Right eye exhibits no discharge. Left eye exhibits no discharge. No scleral icterus.  Neck: Normal range of motion. Neck supple.  Cardiovascular: Normal rate, regular rhythm, normal heart sounds and intact distal pulses.   Pulmonary/Chest: Effort normal and breath sounds normal. No respiratory distress. No wheezes. No rales.  Abdominal: Soft. Bowel sounds are normal. Exhibits no distension and no mass. There is no tenderness.  Musculoskeletal: Normal range of motion. Exhibits no edema.  Lymphadenopathy:    No cervical adenopathy.  Neurological: Alert and oriented to person, place, and time. Exhibits normal muscle tone. Gait normal. Coordination normal.  Skin: Skin is warm and dry. No rash noted. Not diaphoretic. No erythema. No pallor.  Psychiatric: Mood, memory and judgment normal.  Vitals reviewed.  LABORATORY DATA: Lab Results  Component  Value Date   WBC 3.8 (L) 05/06/2017   HGB 12.7 05/06/2017   HCT 37.9 05/06/2017   MCV 95.0 05/06/2017   PLT 139 (L) 05/06/2017      Chemistry      Component Value Date/Time   NA 137 05/06/2017 0817   K 3.4 (L) 05/06/2017 0817   CL 101 07/04/2016 1211   CO2 25 05/06/2017 0817   BUN 8.0 05/06/2017 0817   CREATININE 0.7 05/06/2017 0817      Component Value Date/Time   CALCIUM 9.1 05/06/2017 0817   ALKPHOS 119 05/06/2017 0817   AST 23 05/06/2017 0817   ALT 17 05/06/2017 0817   BILITOT 0.95 05/06/2017 0817       RADIOGRAPHIC STUDIES:  No results found.   ASSESSMENT/PLAN:  Primary cancer of left upper lobe of lung ([place]) This is a very pleasant 76 year old white female with stage IV (T2b, N3, M1 C) non-small cell lung cancer, squamous cell carcinoma presented with large left upper lobe lung mass in addition to mediastinal and supraclavicular lymphadenopathy as well as metastatic brain lesions diagnosed in September 2018.  The patient was seen with Dr. Julien Nordmann. Recommended for the patient to continue with the palliative radiotherapy to the left upper lobe lung  mass and monitoring of the metastatic brain lesions under the care of Dr. [name]. She is due to complete radiation today.  The patient will proceed with cycle 2 of Carboplatin for AUC of 5 and paclitaxel 175 MG/M2 every 3weeks with Neulasta support.  The patient will have a weekly lab while on treatment. The patient will return in 3 weeks for evaluation prior to cycle 3 of her chemotherapy.  For constipation, we discussed that she may take up to 4 Senokot S per day. If she becomes uncomfortable, recommend that she use magnesium citrate one half bottle as needed.  The patient voices understanding of current disease status and treatment options and is in agreement with the current care plan. All questions were answered. The patient knows to call the clinic with any problems, questions or concerns. We can certainly  see the patient much sooner if necessary.   Mikey Bussing, DNP, AGPCNP-BC, AOCNP 05/06/17

## 2017-05-06 NOTE — Assessment & Plan Note (Addendum)
This is a very pleasant 76 year old white female with stage IV (T2b, N3, M1 C) non-small cell lung cancer, squamous cell carcinoma presented with large left upper lobe lung mass in addition to mediastinal and supraclavicular lymphadenopathy as well as metastatic brain lesions diagnosed in September 2018.  The patient was seen with Dr. Julien Nordmann. Recommended for the patient to continue with the palliative radiotherapy to the left upper lobe lung mass and monitoring of the metastatic brain lesions under the care of Dr. [name]. She is due to complete radiation today.  The patient will proceed with cycle 2 of Carboplatin for AUC of 5 and paclitaxel 175 MG/M2 every 3weeks with Neulasta support.  The patient will have a weekly lab while on treatment. The patient will return in 3 weeks for evaluation prior to cycle 3 of her chemotherapy.  For constipation, we discussed that she may take up to 4 Senokot S per day. If she becomes uncomfortable, recommend that she use magnesium citrate one half bottle as needed.  The patient voices understanding of current disease status and treatment options and is in agreement with the current care plan. All questions were answered. The patient knows to call the clinic with any problems, questions or concerns. We can certainly see the patient much sooner if necessary.

## 2017-05-06 NOTE — Progress Notes (Addendum)
  Radiation Oncology         [phone] ________________________________  Name: Jacqueline Kidd MRN: [id number]  Date: 05/06/2017  DOB: [birth year]   End of Treatment Note  Diagnosis:   76 y.o. female with probable stage III nsclc of the right upper lung with right neck nodal disease and 2 brain metastases    Indication for treatment:  Curative, Chemo-Radiotherapy       Radiation treatment dates:    1.  Chest radiation 03/23/2017 - 05/06/2017 2.  SRS on 04/03/17  Site/dose:    1.  The primary tumor and involved mediastinal adenopathy were treated to 66 Gy in 33 fractions of 2 Gy. 2.  [name] received stereotactic radiosurgery to the following targets:  Left cerebellar 12 mm target and Left insular 7 mm target were treated to a prescription dose of 20 Gy.  Beams/energy:    1.  A five field 3D conformal treatment arrangement was used delivering 6 and 10 MV photons.  Daily image-guidance CT was used to align the treatment with the targeted volume 2.  [name] received stereotactic radiosurgery to the Left cerebellar 12 mm target was treated using 3 Dynamic Conformal Arcs to a prescription dose of 20 Gy.  ExacTrac registration was performed for each couch angle.  The 80.3% isodose line was prescribed.  6 MV X-rays were delivered in the flattening filter free beam mode. Left insular 7 mm target was treated using 3 Dynamic Conformal Arcs to a prescription dose of 20 Gy.  ExacTrac registration was performed for each couch angle.  The 81.0% isodose line was prescribed.  6 MV X-rays were delivered in the flattening filter free beam mode.  Narrative: The patient tolerated radiation treatment relatively well.  The patient experienced some esophagitis characterized as 10/10 burning pain. She subsequently had difficulty/pain with swallowing and was mostly unable to swallow solid food without it feeling like it got stuck in her esophagus. She was given carafate and lidocaine for relief and  advised to try softer foods which was helpful. She also reported an occasional productive cough with bubbly phlegm that sometimes caused her to vomit. She denied any shortness of breath. The patient also noted significant fatigue. Hyperpigmentation with dry desquamation was noted within the treatment field. She reported using radiaplex, neosporin, and hydrocortisone on affected skin.   Plan: The patient has completed radiation treatment. The patient will return to radiation oncology clinic for routine followup in one month. I advised her to call or return sooner if she has any questions or concerns related to her recovery or treatment.  ________________________________  Sheral Apley. [name], M.D.  This document serves as a record of services personally performed by Tyler Pita, MD. It was created on his behalf by Rae Lips, a trained medical scribe. The creation of this record is based on the scribe's personal observations and the provider's statements to them. This document has been checked and approved by the attending provider.

## 2017-05-07 ENCOUNTER — Ambulatory Visit: Payer: Medicare Other | Admitting: Oncology

## 2017-05-07 ENCOUNTER — Ambulatory Visit: Payer: Medicare Other

## 2017-05-07 ENCOUNTER — Other Ambulatory Visit: Payer: Medicare Other

## 2017-05-08 ENCOUNTER — Ambulatory Visit (HOSPITAL_BASED_OUTPATIENT_CLINIC_OR_DEPARTMENT_OTHER): Payer: Medicare Other

## 2017-05-08 ENCOUNTER — Telehealth: Payer: Self-pay | Admitting: Internal Medicine

## 2017-05-08 VITALS — BP 122/61 | HR 105 | Temp 98.2°F | Resp 20

## 2017-05-08 DIAGNOSIS — C3412 Malignant neoplasm of upper lobe, left bronchus or lung: Secondary | ICD-10-CM

## 2017-05-08 DIAGNOSIS — Z5189 Encounter for other specified aftercare: Secondary | ICD-10-CM | POA: Diagnosis not present

## 2017-05-08 MED ORDER — PEGFILGRASTIM INJECTION 6 MG/0.6ML ~~LOC~~
6.0000 mg | PREFILLED_SYRINGE | Freq: Once | SUBCUTANEOUS | Status: AC
  Administered 2017-05-08: 6 mg via SUBCUTANEOUS
  Filled 2017-05-08: qty 0.6

## 2017-05-08 NOTE — Telephone Encounter (Signed)
Called patient regarding current schedule will also mail letter

## 2017-05-14 ENCOUNTER — Other Ambulatory Visit (HOSPITAL_BASED_OUTPATIENT_CLINIC_OR_DEPARTMENT_OTHER): Payer: Medicare Other

## 2017-05-14 DIAGNOSIS — C3492 Malignant neoplasm of unspecified part of left bronchus or lung: Secondary | ICD-10-CM

## 2017-05-14 DIAGNOSIS — C3412 Malignant neoplasm of upper lobe, left bronchus or lung: Secondary | ICD-10-CM

## 2017-05-14 LAB — CBC WITH DIFFERENTIAL/PLATELET
BASO%: 0.2 % (ref 0.0–2.0)
BASOS ABS: 0 10*3/uL (ref 0.0–0.1)
EOS ABS: 0 10*3/uL (ref 0.0–0.5)
EOS%: 0.6 % (ref 0.0–7.0)
HEMATOCRIT: 32.8 % — AB (ref 34.8–46.6)
HEMOGLOBIN: 11.1 g/dL — AB (ref 11.6–15.9)
LYMPH#: 0.5 10*3/uL — AB (ref 0.9–3.3)
LYMPH%: 8.3 % — ABNORMAL LOW (ref 14.0–49.7)
MCH: 32.1 pg (ref 25.1–34.0)
MCHC: 33.8 g/dL (ref 31.5–36.0)
MCV: 94.8 fL (ref 79.5–101.0)
MONO#: 1.4 10*3/uL — ABNORMAL HIGH (ref 0.1–0.9)
MONO%: 26.1 % — ABNORMAL HIGH (ref 0.0–14.0)
NEUT#: 3.5 10*3/uL (ref 1.5–6.5)
NEUT%: 64.8 % (ref 38.4–76.8)
Platelets: 54 10*3/uL — ABNORMAL LOW (ref 145–400)
RBC: 3.46 10*6/uL — ABNORMAL LOW (ref 3.70–5.45)
RDW: 15.3 % — AB (ref 11.2–14.5)
WBC: 5.5 10*3/uL (ref 3.9–10.3)

## 2017-05-14 LAB — COMPREHENSIVE METABOLIC PANEL
ALBUMIN: 2.9 g/dL — AB (ref 3.5–5.0)
ALK PHOS: 118 U/L (ref 40–150)
ALT: 15 U/L (ref 0–55)
ANION GAP: 11 meq/L (ref 3–11)
AST: 16 U/L (ref 5–34)
BILIRUBIN TOTAL: 1.19 mg/dL (ref 0.20–1.20)
BUN: 9.9 mg/dL (ref 7.0–26.0)
CALCIUM: 9.1 mg/dL (ref 8.4–10.4)
CO2: 25 mEq/L (ref 22–29)
Chloride: 99 mEq/L (ref 98–109)
Creatinine: 0.7 mg/dL (ref 0.6–1.1)
Glucose: 123 mg/dl (ref 70–140)
Potassium: 3.5 mEq/L (ref 3.5–5.1)
Sodium: 136 mEq/L (ref 136–145)
TOTAL PROTEIN: 6.9 g/dL (ref 6.4–8.3)

## 2017-05-21 ENCOUNTER — Other Ambulatory Visit (HOSPITAL_BASED_OUTPATIENT_CLINIC_OR_DEPARTMENT_OTHER): Payer: Medicare Other

## 2017-05-21 DIAGNOSIS — C3412 Malignant neoplasm of upper lobe, left bronchus or lung: Secondary | ICD-10-CM | POA: Diagnosis not present

## 2017-05-21 DIAGNOSIS — C3492 Malignant neoplasm of unspecified part of left bronchus or lung: Secondary | ICD-10-CM

## 2017-05-21 LAB — COMPREHENSIVE METABOLIC PANEL
ALBUMIN: 2.9 g/dL — AB (ref 3.5–5.0)
ALT: 14 U/L (ref 0–55)
AST: 24 U/L (ref 5–34)
Alkaline Phosphatase: 111 U/L (ref 40–150)
Anion Gap: 9 mEq/L (ref 3–11)
BILIRUBIN TOTAL: 0.82 mg/dL (ref 0.20–1.20)
BUN: 6.1 mg/dL — AB (ref 7.0–26.0)
CO2: 28 meq/L (ref 22–29)
CREATININE: 0.7 mg/dL (ref 0.6–1.1)
Calcium: 8.9 mg/dL (ref 8.4–10.4)
Chloride: 101 mEq/L (ref 98–109)
EGFR: >60 mL/min/{1.73_m2} (ref >60)
GLUCOSE: 107 mg/dL (ref 70–140)
Potassium: 3.4 mEq/L — ABNORMAL LOW (ref 3.5–5.1)
SODIUM: 138 meq/L (ref 136–145)
TOTAL PROTEIN: 7.1 g/dL (ref 6.4–8.3)

## 2017-05-21 LAB — CBC WITH DIFFERENTIAL/PLATELET
BASO%: 0.2 % (ref 0.0–2.0)
BASOS ABS: 0 10*3/uL (ref 0.0–0.1)
EOS%: 0 % (ref 0.0–7.0)
Eosinophils Absolute: 0 10*3/uL (ref 0.0–0.5)
HCT: 32.7 % — ABNORMAL LOW (ref 34.8–46.6)
HGB: 10.8 g/dL — ABNORMAL LOW (ref 11.6–15.9)
LYMPH%: 16.8 % (ref 14.0–49.7)
MCH: 32.1 pg (ref 25.1–34.0)
MCHC: 33 g/dL (ref 31.5–36.0)
MCV: 97.3 fL (ref 79.5–101.0)
MONO#: 0.6 10*3/uL (ref 0.1–0.9)
MONO%: 11 % (ref 0.0–14.0)
NEUT#: 4 10*3/uL (ref 1.5–6.5)
NEUT%: 72 % (ref 38.4–76.8)
Platelets: 121 10*3/uL — ABNORMAL LOW (ref 145–400)
RBC: 3.36 10*6/uL — AB (ref 3.70–5.45)
RDW: 16.7 % — ABNORMAL HIGH (ref 11.2–14.5)
WBC: 5.6 10*3/uL (ref 3.9–10.3)
lymph#: 0.9 10*3/uL (ref 0.9–3.3)

## 2017-05-27 ENCOUNTER — Ambulatory Visit (HOSPITAL_BASED_OUTPATIENT_CLINIC_OR_DEPARTMENT_OTHER): Payer: Medicare Other

## 2017-05-27 ENCOUNTER — Ambulatory Visit: Payer: Medicare Other | Admitting: Internal Medicine

## 2017-05-27 ENCOUNTER — Encounter: Payer: Self-pay | Admitting: Internal Medicine

## 2017-05-27 ENCOUNTER — Other Ambulatory Visit (HOSPITAL_BASED_OUTPATIENT_CLINIC_OR_DEPARTMENT_OTHER): Payer: Medicare Other

## 2017-05-27 ENCOUNTER — Telehealth: Payer: Self-pay

## 2017-05-27 VITALS — HR 96

## 2017-05-27 DIAGNOSIS — C778 Secondary and unspecified malignant neoplasm of lymph nodes of multiple regions: Secondary | ICD-10-CM

## 2017-05-27 DIAGNOSIS — C7931 Secondary malignant neoplasm of brain: Secondary | ICD-10-CM

## 2017-05-27 DIAGNOSIS — C3490 Malignant neoplasm of unspecified part of unspecified bronchus or lung: Secondary | ICD-10-CM

## 2017-05-27 DIAGNOSIS — R634 Abnormal weight loss: Secondary | ICD-10-CM

## 2017-05-27 DIAGNOSIS — C3492 Malignant neoplasm of unspecified part of left bronchus or lung: Secondary | ICD-10-CM

## 2017-05-27 DIAGNOSIS — Z5111 Encounter for antineoplastic chemotherapy: Secondary | ICD-10-CM

## 2017-05-27 DIAGNOSIS — R1310 Dysphagia, unspecified: Secondary | ICD-10-CM | POA: Diagnosis not present

## 2017-05-27 DIAGNOSIS — C3412 Malignant neoplasm of upper lobe, left bronchus or lung: Secondary | ICD-10-CM

## 2017-05-27 LAB — COMPREHENSIVE METABOLIC PANEL
ALT: 19 U/L (ref 0–55)
AST: 33 U/L (ref 5–34)
Albumin: 2.8 g/dL — ABNORMAL LOW (ref 3.5–5.0)
Alkaline Phosphatase: 101 U/L (ref 40–150)
Anion Gap: 9 mEq/L (ref 3–11)
BUN: 4.2 mg/dL — AB (ref 7.0–26.0)
CO2: 27 meq/L (ref 22–29)
Calcium: 8.8 mg/dL (ref 8.4–10.4)
Chloride: 104 mEq/L (ref 98–109)
Creatinine: 0.7 mg/dL (ref 0.6–1.1)
GLUCOSE: 116 mg/dL (ref 70–140)
POTASSIUM: 3.3 meq/L — AB (ref 3.5–5.1)
Sodium: 140 mEq/L (ref 136–145)
Total Bilirubin: 0.85 mg/dL (ref 0.20–1.20)
Total Protein: 7 g/dL (ref 6.4–8.3)

## 2017-05-27 LAB — CBC WITH DIFFERENTIAL/PLATELET
BASO%: 1.1 % (ref 0.0–2.0)
Basophils Absolute: 0 10*3/uL (ref 0.0–0.1)
EOS ABS: 0 10*3/uL (ref 0.0–0.5)
EOS%: 0.2 % (ref 0.0–7.0)
HCT: 33.3 % — ABNORMAL LOW (ref 34.8–46.6)
HEMOGLOBIN: 11.2 g/dL — AB (ref 11.6–15.9)
LYMPH%: 33.5 % (ref 14.0–49.7)
MCH: 32.3 pg (ref 25.1–34.0)
MCHC: 33.7 g/dL (ref 31.5–36.0)
MCV: 96.1 fL (ref 79.5–101.0)
MONO#: 0.5 10*3/uL (ref 0.1–0.9)
MONO%: 16.5 % — ABNORMAL HIGH (ref 0.0–14.0)
NEUT%: 48.7 % (ref 38.4–76.8)
NEUTROS ABS: 1.6 10*3/uL (ref 1.5–6.5)
PLATELETS: 190 10*3/uL (ref 145–400)
RBC: 3.46 10*6/uL — ABNORMAL LOW (ref 3.70–5.45)
RDW: 17.6 % — ABNORMAL HIGH (ref 11.2–14.5)
WBC: 3.2 10*3/uL — AB (ref 3.9–10.3)
lymph#: 1.1 10*3/uL (ref 0.9–3.3)

## 2017-05-27 MED ORDER — SODIUM CHLORIDE 0.9 % IV SOLN
167.0000 mg/m2 | Freq: Once | INTRAVENOUS | Status: AC
  Administered 2017-05-27: 300 mg via INTRAVENOUS
  Filled 2017-05-27: qty 50

## 2017-05-27 MED ORDER — SODIUM CHLORIDE 0.9 % IV SOLN
Freq: Once | INTRAVENOUS | Status: AC
  Administered 2017-05-27: 11:00:00 via INTRAVENOUS

## 2017-05-27 MED ORDER — SODIUM CHLORIDE 0.9 % IV SOLN
404.5000 mg | Freq: Once | INTRAVENOUS | Status: AC
  Administered 2017-05-27: 400 mg via INTRAVENOUS
  Filled 2017-05-27: qty 40

## 2017-05-27 MED ORDER — DIPHENHYDRAMINE HCL 50 MG/ML IJ SOLN
50.0000 mg | Freq: Once | INTRAMUSCULAR | Status: AC
  Administered 2017-05-27: 50 mg via INTRAVENOUS

## 2017-05-27 MED ORDER — SODIUM CHLORIDE 0.9% FLUSH
10.0000 mL | INTRAVENOUS | Status: DC | PRN
  Filled 2017-05-27: qty 10

## 2017-05-27 MED ORDER — PALONOSETRON HCL INJECTION 0.25 MG/5ML
0.2500 mg | Freq: Once | INTRAVENOUS | Status: AC
  Administered 2017-05-27: 0.25 mg via INTRAVENOUS

## 2017-05-27 MED ORDER — SODIUM CHLORIDE 0.9 % IV SOLN
20.0000 mg | Freq: Once | INTRAVENOUS | Status: AC
  Administered 2017-05-27: 20 mg via INTRAVENOUS
  Filled 2017-05-27: qty 2

## 2017-05-27 MED ORDER — FAMOTIDINE IN NACL 20-0.9 MG/50ML-% IV SOLN
INTRAVENOUS | Status: AC
  Filled 2017-05-27: qty 50

## 2017-05-27 MED ORDER — FAMOTIDINE IN NACL 20-0.9 MG/50ML-% IV SOLN
20.0000 mg | Freq: Once | INTRAVENOUS | Status: AC
  Administered 2017-05-27: 20 mg via INTRAVENOUS

## 2017-05-27 MED ORDER — PALONOSETRON HCL INJECTION 0.25 MG/5ML
INTRAVENOUS | Status: AC
  Filled 2017-05-27: qty 5

## 2017-05-27 MED ORDER — DIPHENHYDRAMINE HCL 50 MG/ML IJ SOLN
INTRAMUSCULAR | Status: AC
  Filled 2017-05-27: qty 1

## 2017-05-27 NOTE — Telephone Encounter (Signed)
Printed avs and calender for upcoming appointment . Gave the patient 2 bottles of contrastnformation for ct. Per 11/21 los

## 2017-05-27 NOTE — Patient Instructions (Signed)
[facility] Discharge Instructions for Patients Receiving Chemotherapy  Today you received the following chemotherapy agents Taxol; Carboplatin  To help prevent nausea and vomiting after your treatment, we encourage you to take your nausea medication as directed   If you develop nausea and vomiting that is not controlled by your nausea medication, call the clinic.   BELOW ARE SYMPTOMS THAT SHOULD BE REPORTED IMMEDIATELY:  *FEVER GREATER THAN 100.5 F  *CHILLS WITH OR WITHOUT FEVER  NAUSEA AND VOMITING THAT IS NOT CONTROLLED WITH YOUR NAUSEA MEDICATION  *UNUSUAL SHORTNESS OF BREATH  *UNUSUAL BRUISING OR BLEEDING  TENDERNESS IN MOUTH AND THROAT WITH OR WITHOUT PRESENCE OF ULCERS  *URINARY PROBLEMS  *BOWEL PROBLEMS  UNUSUAL RASH Items with * indicate a potential emergency and should be followed up as soon as possible.  Feel free to call the clinic should you have any questions or concerns. The clinic phone number is [phone].  Please show the [facility] at check-in to the Emergency Department and triage nurse.

## 2017-05-27 NOTE — Progress Notes (Signed)
[facility] Telephone:([phone]) [phone]   Fax:(336) [place] [phone]  DIAGNOSIS: stage IV(T2b, N3, M1c)  lung cancer pending further staging workup and tissue diagnosis. The patient presented with large left upper lobe lung mass in addition to left hilar and mediastinal lymphadenopathy and left true vocal cord paralysis.  PRIOR THERAPY: Palliative radiotherapy to the large left upper lobe lung mass as well as metastatic brain lesions.  CURRENT THERAPY:  1) systemic chemotherapy with carboplatin for AUC of 5 and paclitaxel 175 MG/M2 every 3 weeks. First dose on 04/15/2017.  INTERVAL HISTORY: Jacqueline Kidd 76 y.o. female returns to the clinic today for follow-up visit accompanied by her son.  The patient is currently on systemic chemotherapy with carboplatin and paclitaxel status post 2 cycles and she continues to tolerate her treatment fairly well.  She has mild numbness and tingling in her fingers.  She denied having any chest pain, shortness of breath, cough or hemoptysis.  She continues to have mild odynophagia and she is currently on treatment with Carafate.  The patient lost a few pounds since her last visit.  She is here today for evaluation before starting cycle #3.   MEDICAL HISTORY: Past Medical History:  Diagnosis Date  . Chronic low back pain   . COPD (chronic obstructive pulmonary disease) ([place])   . GERD (gastroesophageal reflux disease)   . Hyperlipidemia   . Hypertension   . Lesion of left lung   . Osteoporosis     ALLERGIES:  is allergic to fosamax [alendronate]; hydrocodone-acetaminophen; and lisinopril-hydrochlorothiazide.  MEDICATIONS:  Current Outpatient Medications  Medication Sig Dispense Refill  . acetaminophen (TYLENOL) 325 MG tablet Take 650 mg by mouth every 6 (six) hours as needed for mild pain or moderate pain.     Marland Kitchen amLODipine (NORVASC) 2.5 MG tablet Take 1 tablet  (2.5 mg total) by mouth daily. 90 tablet 1  . aspirin EC 81 MG tablet Take 81 mg by mouth daily.    . cholecalciferol (VITAMIN D) 1000 units tablet Take 1,000 Units by mouth daily.    Marland Kitchen lidocaine (XYLOCAINE) 2 % solution Use as directed 5 mLs in the mouth or throat as needed for mouth pain. Mix with 5 mL water in 10 mL syringe and instill past tongue 200 mL 5  . LORazepam (ATIVAN) 0.5 MG tablet Take 1 tablet (0.5 mg total) by mouth as needed for anxiety (30 min prior to radiotherapy treatment; may repeat after 30 min if needed). 2 tablet 0  . Omega-3 Fatty Acids (FISH OIL) 1000 MG CAPS Take 1 capsule by mouth daily.    . prochlorperazine (COMPAZINE) 10 MG tablet Take 1 tablet (10 mg total) by mouth every 6 (six) hours as needed for nausea or vomiting. 30 tablet 1  . sucralfate (CARAFATE) 1 g tablet Take 1 tablet (1 g total) by mouth 4 (four) times daily -  with meals and at bedtime. 5 min before meals for radiation induced esophagitis 120 tablet 2  . traMADol (ULTRAM) 50 MG tablet Take 1 tablet (50 mg total) by mouth every 8 (eight) hours as needed. 15 tablet 0   No current facility-administered medications for this visit.     SURGICAL HISTORY:  Past Surgical History:  Procedure Laterality Date  . ABDOMINAL HYSTERECTOMY    . TOTAL HIP ARTHROPLASTY     right    REVIEW OF SYSTEMS:  A  comprehensive review of systems was negative except for: Constitutional: positive for fatigue and weight loss Gastrointestinal: positive for odynophagia Neurological: positive for paresthesia   PHYSICAL EXAMINATION: General appearance: alert, cooperative, fatigued and no distress Head: Normocephalic, without obvious abnormality, atraumatic Neck: no adenopathy, no JVD, supple, symmetrical, trachea midline and thyroid not enlarged, symmetric, no tenderness/mass/nodules Lymph nodes: Cervical, supraclavicular, and axillary nodes normal. Resp: wheezes bilaterally Back: symmetric, no curvature. ROM normal. No CVA  tenderness. Cardio: regular rate and rhythm, S1, S2 normal, no murmur, click, rub or gallop GI: soft, non-tender; bowel sounds normal; no masses,  no organomegaly Extremities: extremities normal, atraumatic, no cyanosis or edema  ECOG PERFORMANCE STATUS: 1 - Symptomatic but completely ambulatory  Blood pressure 117/64, pulse (!) 101, temperature 98.7 F (37.1 C), temperature source Oral, resp. rate 18, height 5' 1.5" (1.562 m), weight 142 lb 3.2 oz (64.5 kg), SpO2 98 %.  LABORATORY DATA: Lab Results  Component Value Date   WBC 3.2 (L) 05/27/2017   HGB 11.2 (L) 05/27/2017   HCT 33.3 (L) 05/27/2017   MCV 96.1 05/27/2017   PLT 190 05/27/2017      Chemistry      Component Value Date/Time   NA 140 05/27/2017 0907   K 3.3 (L) 05/27/2017 0907   CL 101 07/04/2016 1211   CO2 27 05/27/2017 0907   BUN 4.2 (L) 05/27/2017 0907   CREATININE 0.7 05/27/2017 0907      Component Value Date/Time   CALCIUM 8.8 05/27/2017 0907   ALKPHOS 101 05/27/2017 0907   AST 33 05/27/2017 0907   ALT 19 05/27/2017 0907   BILITOT 0.85 05/27/2017 0907       RADIOGRAPHIC STUDIES: No results found.  ASSESSMENT AND PLAN: This is a very pleasant 76 years old white female with stage IV (T2b, N3, M1 C) non-small cell lung cancer, squamous cell carcinoma presented with large left upper lobe lung mass in addition to mediastinal and supraclavicular lymphadenopathy as well as metastatic brain lesions diagnosed in September 2018. The patient underwent a course of palliative radiotherapy to the left upper lobe lung mass as well as the metastatic brain lesions. She is currently undergoing palliative systemic chemotherapy with carboplatin for AC of 5 and paclitaxel 175 mg/M2 every 3 weeks, status post 2 cycles. She continues to tolerate her treatment fairly well. We will proceed with cycle #3 today as a scheduled. I would see the patient back for follow-up visit in 3 weeks for evaluation after repeating CT scan of the  chest, abdomen and pelvis for restaging of her disease. The patient was advised to call immediately if she has any concerning symptoms in the interval. The patient and her husband agreed to the current plan. The patient voices understanding of current disease status and treatment options and is in agreement with the current care plan. All questions were answered. The patient knows to call the clinic with any problems, questions or concerns. We can certainly see the patient much sooner if necessary.  Disclaimer: This note was dictated with voice recognition software. Similar sounding words can inadvertently be transcribed and may not be corrected upon review.

## 2017-05-29 ENCOUNTER — Ambulatory Visit (HOSPITAL_BASED_OUTPATIENT_CLINIC_OR_DEPARTMENT_OTHER): Payer: Medicare Other

## 2017-05-29 VITALS — BP 114/72 | HR 98 | Temp 98.7°F | Resp 18

## 2017-05-29 DIAGNOSIS — C3412 Malignant neoplasm of upper lobe, left bronchus or lung: Secondary | ICD-10-CM

## 2017-05-29 DIAGNOSIS — C778 Secondary and unspecified malignant neoplasm of lymph nodes of multiple regions: Secondary | ICD-10-CM | POA: Diagnosis not present

## 2017-05-29 DIAGNOSIS — Z5189 Encounter for other specified aftercare: Secondary | ICD-10-CM

## 2017-05-29 DIAGNOSIS — C7931 Secondary malignant neoplasm of brain: Secondary | ICD-10-CM

## 2017-05-29 MED ORDER — PEGFILGRASTIM INJECTION 6 MG/0.6ML ~~LOC~~
6.0000 mg | PREFILLED_SYRINGE | Freq: Once | SUBCUTANEOUS | Status: AC
  Administered 2017-05-29: 6 mg via SUBCUTANEOUS
  Filled 2017-05-29: qty 0.6

## 2017-05-29 NOTE — Patient Instructions (Signed)
Pegfilgrastim injection What is this medicine? PEGFILGRASTIM (PEG fil gra stim) is a long-acting granulocyte colony-stimulating factor that stimulates the growth of neutrophils, a type of white blood cell important in the body's fight against infection. It is used to reduce the incidence of fever and infection in patients with certain types of cancer who are receiving chemotherapy that affects the bone marrow, and to increase survival after being exposed to high doses of radiation. This medicine may be used for other purposes; ask your health care provider or pharmacist if you have questions. COMMON BRAND NAME(S): Neulasta What should I tell my health care provider before I take this medicine? They need to know if you have any of these conditions: -kidney disease -latex allergy -ongoing radiation therapy -sickle cell disease -skin reactions to acrylic adhesives (On-Body Injector only) -an unusual or allergic reaction to pegfilgrastim, filgrastim, other medicines, foods, dyes, or preservatives -pregnant or trying to get pregnant -breast-feeding How should I use this medicine? This medicine is for injection under the skin. If you get this medicine at home, you will be taught how to prepare and give the pre-filled syringe or how to use the On-body Injector. Refer to the patient Instructions for Use for detailed instructions. Use exactly as directed. Tell your healthcare provider immediately if you suspect that the On-body Injector may not have performed as intended or if you suspect the use of the On-body Injector resulted in a missed or partial dose. It is important that you put your used needles and syringes in a special sharps container. Do not put them in a trash can. If you do not have a sharps container, call your pharmacist or healthcare provider to get one. Talk to your pediatrician regarding the use of this medicine in children. While this drug may be prescribed for selected conditions,  precautions do apply. Overdosage: If you think you have taken too much of this medicine contact a poison control center or emergency room at once. NOTE: This medicine is only for you. Do not share this medicine with others. What if I miss a dose? It is important not to miss your dose. Call your doctor or health care professional if you miss your dose. If you miss a dose due to an On-body Injector failure or leakage, a new dose should be administered as soon as possible using a single prefilled syringe for manual use. What may interact with this medicine? Interactions have not been studied. Give your health care provider a list of all the medicines, herbs, non-prescription drugs, or dietary supplements you use. Also tell them if you smoke, drink alcohol, or use illegal drugs. Some items may interact with your medicine. This list may not describe all possible interactions. Give your health care provider a list of all the medicines, herbs, non-prescription drugs, or dietary supplements you use. Also tell them if you smoke, drink alcohol, or use illegal drugs. Some items may interact with your medicine. What should I watch for while using this medicine? You may need blood work done while you are taking this medicine. If you are going to need a MRI, CT scan, or other procedure, tell your doctor that you are using this medicine (On-Body Injector only). What side effects may I notice from receiving this medicine? Side effects that you should report to your doctor or health care professional as soon as possible: -allergic reactions like skin rash, itching or hives, swelling of the face, lips, or tongue -dizziness -fever -pain, redness, or irritation at site   where injected -pinpoint red spots on the skin -red or dark-brown urine -shortness of breath or breathing problems -stomach or side pain, or pain at the shoulder -swelling -tiredness -trouble passing urine or change in the amount of urine Side  effects that usually do not require medical attention (report to your doctor or health care professional if they continue or are bothersome): -bone pain -muscle pain This list may not describe all possible side effects. Call your doctor for medical advice about side effects. You may report side effects to FDA at 1-800-FDA-1088. Where should I keep my medicine? Keep out of the reach of children. Store pre-filled syringes in a refrigerator between 2 and 8 degrees C (36 and 46 degrees F). Do not freeze. Keep in carton to protect from light. Throw away this medicine if it is left out of the refrigerator for more than 48 hours. Throw away any unused medicine after the expiration date. NOTE: This sheet is a summary. It may not cover all possible information. If you have questions about this medicine, talk to your doctor, pharmacist, or health care provider.  2018 Elsevier/Gold Standard (2016-06-19 12:58:03)  

## 2017-06-03 ENCOUNTER — Other Ambulatory Visit (HOSPITAL_BASED_OUTPATIENT_CLINIC_OR_DEPARTMENT_OTHER): Payer: Medicare Other

## 2017-06-03 DIAGNOSIS — C3492 Malignant neoplasm of unspecified part of left bronchus or lung: Secondary | ICD-10-CM

## 2017-06-03 DIAGNOSIS — C7931 Secondary malignant neoplasm of brain: Secondary | ICD-10-CM | POA: Diagnosis not present

## 2017-06-03 DIAGNOSIS — C778 Secondary and unspecified malignant neoplasm of lymph nodes of multiple regions: Secondary | ICD-10-CM

## 2017-06-03 DIAGNOSIS — C3412 Malignant neoplasm of upper lobe, left bronchus or lung: Secondary | ICD-10-CM

## 2017-06-03 LAB — CBC WITH DIFFERENTIAL/PLATELET
BASO%: 0.2 % (ref 0.0–2.0)
Basophils Absolute: 0 10*3/uL (ref 0.0–0.1)
EOS%: 1.8 % (ref 0.0–7.0)
Eosinophils Absolute: 0 10*3/uL (ref 0.0–0.5)
HCT: 29 % — ABNORMAL LOW (ref 34.8–46.6)
HEMOGLOBIN: 9.6 g/dL — AB (ref 11.6–15.9)
LYMPH%: 39.5 % (ref 14.0–49.7)
MCH: 32.3 pg (ref 25.1–34.0)
MCHC: 33.3 g/dL (ref 31.5–36.0)
MCV: 96.9 fL (ref 79.5–101.0)
MONO#: 0.6 10*3/uL (ref 0.1–0.9)
MONO%: 27.7 % — AB (ref 0.0–14.0)
NEUT%: 30.8 % — ABNORMAL LOW (ref 38.4–76.8)
NEUTROS ABS: 0.7 10*3/uL — AB (ref 1.5–6.5)
Platelets: 61 10*3/uL — ABNORMAL LOW (ref 145–400)
RBC: 2.99 10*6/uL — AB (ref 3.70–5.45)
RDW: 18.4 % — AB (ref 11.2–14.5)
WBC: 2.2 10*3/uL — AB (ref 3.9–10.3)
lymph#: 0.9 10*3/uL (ref 0.9–3.3)

## 2017-06-03 LAB — COMPREHENSIVE METABOLIC PANEL
ALBUMIN: 3 g/dL — AB (ref 3.5–5.0)
ALK PHOS: 111 U/L (ref 40–150)
ALT: 24 U/L (ref 0–55)
AST: 31 U/L (ref 5–34)
Anion Gap: 8 mEq/L (ref 3–11)
BILIRUBIN TOTAL: 0.99 mg/dL (ref 0.20–1.20)
BUN: 7 mg/dL (ref 7.0–26.0)
CO2: 26 meq/L (ref 22–29)
Calcium: 9 mg/dL (ref 8.4–10.4)
Chloride: 102 mEq/L (ref 98–109)
Creatinine: 0.7 mg/dL (ref 0.6–1.1)
EGFR: >60 mL/min/{1.73_m2} (ref >60)
GLUCOSE: 107 mg/dL (ref 70–140)
POTASSIUM: 4 meq/L (ref 3.5–5.1)
SODIUM: 136 meq/L (ref 136–145)
TOTAL PROTEIN: 6.9 g/dL (ref 6.4–8.3)

## 2017-06-05 ENCOUNTER — Encounter: Payer: Self-pay | Admitting: Urology

## 2017-06-05 ENCOUNTER — Ambulatory Visit
Admission: RE | Admit: 2017-06-05 | Discharge: 2017-06-05 | Disposition: A | Discharge status: Home / Self Care | Payer: Medicare Other | Source: Ambulatory Visit | Attending: Radiation Oncology | Admitting: Radiation Oncology

## 2017-06-05 ENCOUNTER — Emergency Department (HOSPITAL_COMMUNITY)
Admission: EM | Admit: 2017-06-05 | Discharge: 2017-06-06 | Disposition: A | Discharge status: Home / Self Care | Payer: Medicare Other | Attending: Emergency Medicine | Admitting: Emergency Medicine

## 2017-06-05 ENCOUNTER — Ambulatory Visit (HOSPITAL_COMMUNITY)
Admission: RE | Admit: 2017-06-05 | Discharge: 2017-06-05 | Disposition: A | Discharge status: Home / Self Care | Payer: Medicare Other | Source: Ambulatory Visit | Attending: Urology | Admitting: Urology

## 2017-06-05 ENCOUNTER — Emergency Department (HOSPITAL_COMMUNITY): Payer: Medicare Other

## 2017-06-05 ENCOUNTER — Encounter (HOSPITAL_COMMUNITY): Payer: Self-pay | Admitting: Emergency Medicine

## 2017-06-05 ENCOUNTER — Other Ambulatory Visit: Payer: Self-pay

## 2017-06-05 VITALS — BP 106/57 | HR 93 | Temp 97.8°F | Resp 18 | Ht 61.5 in

## 2017-06-05 DIAGNOSIS — S79911A Unspecified injury of right hip, initial encounter: Secondary | ICD-10-CM | POA: Diagnosis not present

## 2017-06-05 DIAGNOSIS — M79621 Pain in right upper arm: Secondary | ICD-10-CM

## 2017-06-05 DIAGNOSIS — W0110XA Fall on same level from slipping, tripping and stumbling with subsequent striking against unspecified object, initial encounter: Secondary | ICD-10-CM | POA: Diagnosis not present

## 2017-06-05 DIAGNOSIS — I10 Essential (primary) hypertension: Secondary | ICD-10-CM | POA: Diagnosis not present

## 2017-06-05 DIAGNOSIS — Z7982 Long term (current) use of aspirin: Secondary | ICD-10-CM | POA: Diagnosis not present

## 2017-06-05 DIAGNOSIS — Z79899 Other long term (current) drug therapy: Secondary | ICD-10-CM | POA: Insufficient documentation

## 2017-06-05 DIAGNOSIS — S42201A Unspecified fracture of upper end of right humerus, initial encounter for closed fracture: Secondary | ICD-10-CM | POA: Insufficient documentation

## 2017-06-05 DIAGNOSIS — Y92238 Other place in hospital as the place of occurrence of the external cause: Secondary | ICD-10-CM | POA: Diagnosis not present

## 2017-06-05 DIAGNOSIS — Z87891 Personal history of nicotine dependence: Secondary | ICD-10-CM | POA: Diagnosis not present

## 2017-06-05 DIAGNOSIS — W19XXXA Unspecified fall, initial encounter: Secondary | ICD-10-CM | POA: Insufficient documentation

## 2017-06-05 DIAGNOSIS — C7989 Secondary malignant neoplasm of other specified sites: Secondary | ICD-10-CM | POA: Insufficient documentation

## 2017-06-05 DIAGNOSIS — Z96641 Presence of right artificial hip joint: Secondary | ICD-10-CM | POA: Diagnosis not present

## 2017-06-05 DIAGNOSIS — J449 Chronic obstructive pulmonary disease, unspecified: Secondary | ICD-10-CM | POA: Diagnosis not present

## 2017-06-05 DIAGNOSIS — S42251A Displaced fracture of greater tuberosity of right humerus, initial encounter for closed fracture: Secondary | ICD-10-CM | POA: Insufficient documentation

## 2017-06-05 DIAGNOSIS — C7931 Secondary malignant neoplasm of brain: Secondary | ICD-10-CM | POA: Diagnosis not present

## 2017-06-05 DIAGNOSIS — Y999 Unspecified external cause status: Secondary | ICD-10-CM | POA: Diagnosis not present

## 2017-06-05 DIAGNOSIS — X58XXXA Exposure to other specified factors, initial encounter: Secondary | ICD-10-CM | POA: Insufficient documentation

## 2017-06-05 DIAGNOSIS — S42301A Unspecified fracture of shaft of humerus, right arm, initial encounter for closed fracture: Secondary | ICD-10-CM | POA: Diagnosis not present

## 2017-06-05 DIAGNOSIS — M25551 Pain in right hip: Secondary | ICD-10-CM | POA: Diagnosis not present

## 2017-06-05 DIAGNOSIS — Y939 Activity, unspecified: Secondary | ICD-10-CM | POA: Diagnosis not present

## 2017-06-05 DIAGNOSIS — C801 Malignant (primary) neoplasm, unspecified: Secondary | ICD-10-CM | POA: Diagnosis not present

## 2017-06-05 DIAGNOSIS — C3412 Malignant neoplasm of upper lobe, left bronchus or lung: Secondary | ICD-10-CM

## 2017-06-05 DIAGNOSIS — C781 Secondary malignant neoplasm of mediastinum: Secondary | ICD-10-CM | POA: Insufficient documentation

## 2017-06-05 MED ORDER — OXYCODONE-ACETAMINOPHEN 5-325 MG PO TABS: 1.0000 | ORAL_TABLET | ORAL | 0 refills | Status: DC | PRN

## 2017-06-05 MED ORDER — OXYCODONE-ACETAMINOPHEN 5-325 MG PO TABS
1.0000 | ORAL_TABLET | Freq: Once | ORAL | Status: AC
  Administered 2017-06-05: 1 via ORAL
  Filled 2017-06-05: qty 1

## 2017-06-05 NOTE — ED Provider Notes (Signed)
[facility] DEPT Provider Note   CSN: [id number] Arrival date & time: 06/05/17  1639     History   Chief Complaint Chief Complaint  Patient presents with  . Arm Injury    HPI Jacqueline Kidd is a 76 y.o. female.  Who presents the emergency department chief complaint of right humerus fracture.  Patient was at the cancer center at the radiology department and tripped over a threshold in the hospital.  She fell forward hitting her right arm and hip.  She had an x-ray of the shoulder that showed a proximal humerus fracture.  The patient had a long wait here and also is complaining of right hip pain.  She denies hitting her head or losing consciousness.  Her PA in the oncology department already called in a prescription for pain medicine.  She denies any new numbness or tingling in her hands.  HPI  Past Medical History:  Diagnosis Date  . Chronic low back pain   . COPD (chronic obstructive pulmonary disease) ([facility])   . GERD (gastroesophageal reflux disease)   . Hyperlipidemia   . Hypertension   . Lesion of left lung   . Osteoporosis     Patient Active Problem List   Diagnosis Date Noted  . Need for influenza vaccination 04/15/2017  . Brain metastases ([place]) 03/30/2017  . Goals of care, counseling/discussion 03/23/2017  . Encounter for antineoplastic chemotherapy 03/23/2017  . Mass of parotid gland 2020/08/816  . Primary cancer of left upper lobe of lung ([place]) 02/19/2017  . Lung mass 02/16/2017  . BMI 33.0-33.9,adult 03/21/2016  . Peripheral edema 03/21/2016  . Osteoporosis 06/21/2014  . Hypertension 01/21/2013    Past Surgical History:  Procedure Laterality Date  . ABDOMINAL HYSTERECTOMY    . TOTAL HIP ARTHROPLASTY     right    OB History    No data available       Home Medications    Prior to Admission medications   Medication Sig Start Date End Date Taking? Authorizing Provider  acetaminophen (TYLENOL) 325 MG tablet Take 650 mg by  mouth every 6 (six) hours as needed for mild pain or moderate pain.    Yes [provider]  aspirin EC 81 MG tablet Take 81 mg by mouth daily.   Yes [provider]  cholecalciferol (VITAMIN D) 1000 units tablet Take 1,000 Units by mouth daily.   Yes [provider]  LORazepam (ATIVAN) 0.5 MG tablet Take 1 tablet (0.5 mg total) by mouth as needed for anxiety (30 min prior to radiotherapy treatment; may repeat after 30 min if needed). 03/31/17  Yes Bruning, Ashlyn, PA-C  Omega-3 Fatty Acids (FISH OIL) 1000 MG CAPS Take 1 capsule by mouth daily.   Yes [provider]  oxyCODONE-acetaminophen (PERCOCET/ROXICET) 5-325 MG tablet Take 1-2 tablets by mouth every 4 (four) hours as needed for severe pain. 06/05/17  Yes Bruning, Ashlyn, PA-C  prochlorperazine (COMPAZINE) 10 MG tablet Take 1 tablet (10 mg total) by mouth every 6 (six) hours as needed for nausea or vomiting. 03/20/17  Yes Curt Bears, MD  amLODipine (NORVASC) 2.5 MG tablet Take 1 tablet (2.5 mg total) by mouth daily. Patient not taking: Reported on 06/05/2017 03/16/17   [name], MD  lidocaine (XYLOCAINE) 2 % solution Use as directed 5 mLs in the mouth or throat as needed for mouth pain. Mix with 5 mL water in 10 mL syringe and instill past tongue Patient not taking: Reported on 05/27/2017  04/16/17   Tyler Pita, MD  sucralfate (CARAFATE) 1 g tablet Take 1 tablet (1 g total) by mouth 4 (four) times daily -  with meals and at bedtime. 5 min before meals for radiation induced esophagitis Patient not taking: Reported on 05/27/2017 04/10/17   Tyler Pita, MD  traMADol (ULTRAM) 50 MG tablet Take 1 tablet (50 mg total) by mouth every 8 (eight) hours as needed. Patient not taking: Reported on 06/05/2017 03/16/17   [name], MD    Family History Family History  Problem Relation Age of Onset  . Cancer Mother        breast  . Cancer Father        esophageal  . Cancer Sister         breast    Social History Social History   Tobacco Use  . Smoking status: Former Smoker    Packs/day: 1.00    Years: 57.00    Pack years: 57.00    Last attempt to quit: 02/16/2017    Years since quitting: 0.2  . Smokeless tobacco: Never Used  Substance Use Topics  . Alcohol use: No  . Drug use: No     Allergies   Fosamax [alendronate]; Hydrocodone-acetaminophen; and Lisinopril-hydrochlorothiazide   Review of Systems Review of Systems  Ten systems reviewed and are negative for acute change, except as noted in the HPI.   Physical Exam Updated Vital Signs BP 118/66 (BP Location: Right Arm)   Pulse 84   Temp 98.3 F (36.8 C) (Oral)   Resp 18   SpO2 96%   Physical Exam  Constitutional: She is oriented to person, place, and time. She appears well-developed and well-nourished. No distress.  HENT:  Head: Normocephalic and atraumatic.  Eyes: Conjunctivae are normal. No scleral icterus.  Neck: Normal range of motion.  Cardiovascular: Normal rate, regular rhythm and normal heart sounds. Exam reveals no gallop and no friction rub.  No murmur heard. Pulmonary/Chest: Effort normal and breath sounds normal. No respiratory distress.  Abdominal: Soft. Bowel sounds are normal. She exhibits no distension and no mass. There is no tenderness. There is no guarding.  Musculoskeletal:  Swelling and tenderness at the proximal humerus on the right side.  She has normal distal pulse, strong grip strength. Pain with internal and external rotation of the right hip.  Normal strength.  DP and PT pulses 2+ bilaterally  Neurological: She is alert and oriented to person, place, and time.  Skin: Skin is warm and dry. She is not diaphoretic.  Psychiatric: Her behavior is normal.  Nursing note and vitals reviewed.    ED Treatments / Results  Labs (all labs ordered are listed, but only abnormal results are displayed) Labs Reviewed - No data to display  EKG  EKG Interpretation None        Radiology Dg Humerus Right  Result Date: 06/05/2017 CLINICAL DATA:  Right upper arm pain after fall today. EXAM: RIGHT HUMERUS - 2+ VIEW COMPARISON:  None. FINDINGS: Moderately displaced fracture is seen involving the greater tuberosity of proximal right humerus. Possible nondisplaced humeral neck fracture cannot be excluded. No dislocation is noted. IMPRESSION: Moderately displaced fracture involving greater tuberosity of proximal right humerus. Possible nondisplaced right humeral neck fracture cannot be excluded; CT scan is recommended for further evaluation. Electronically Signed   By: Marijo Conception, M.D.   On: 06/05/2017 15:25   Dg Hip Unilat With Pelvis 2-3 Views Right  Result Date: 06/05/2017 CLINICAL DATA:  76 y/o  F; fall with right hip pain. EXAM: DG HIP (WITH OR WITHOUT PELVIS) 2-3V RIGHT COMPARISON:  None. FINDINGS: Right total hip prosthesis, no periprosthetic lucency or fracture identified. Mild left hip osteoarthrosis with periarticular osteophytes. No pelvic fracture or diastases identified. IMPRESSION: Right total hip prosthesis. No acute fracture or dislocation identified. Electronically Signed   By: Kristine Garbe M.D.   On: 06/05/2017 21:04    Procedures Procedures (including critical care time)  Medications Ordered in ED Medications - No data to display   Initial Impression / Assessment and Plan / ED Course  I have reviewed the triage vital signs and the nursing notes.  Pertinent labs & imaging results that were available during my care of the patient were reviewed by me and considered in my medical decision making (see chart for details).     Patient with proximal right humeral fracture.  Placed in sling immobilizer.  Patient will be discharged home with pain medication and orthopedic follow-up.  Her hip x-ray is negative and she is ambulatory.  Appears appropriate for discharge at this time  Final Clinical Impressions(s) / ED Diagnoses   Final  diagnoses:  Fall  Closed fracture of proximal end of right humerus, unspecified fracture morphology, initial encounter    ED Discharge Orders    None       Margarita Mail, PA-C 06/05/17 2148    Fredia Sorrow, MD 06/06/17 1750

## 2017-06-05 NOTE — ED Provider Notes (Signed)
Medical screening examination/treatment/procedure(s) were conducted as a shared visit with non-physician practitioner(s) and myself.  I personally evaluated the patient during the encounter.   EKG Interpretation None       Results for orders placed or performed in visit on 06/03/17  CBC with Differential  Result Value Ref Range   WBC 2.2 (L) 3.9 - 10.3 10e3/uL   NEUT# 0.7 (L) 1.5 - 6.5 10e3/uL   HGB 9.6 (L) 11.6 - 15.9 g/dL   HCT 29.0 (L) 34.8 - 46.6 %   Platelets 61 (L) 145 - 400 10e3/uL   MCV 96.9 79.5 - 101.0 fL   MCH 32.3 25.1 - 34.0 pg   MCHC 33.3 31.5 - 36.0 g/dL   RBC 2.99 (L) 3.70 - 5.45 10e6/uL   RDW 18.4 (H) 11.2 - 14.5 %   lymph# 0.9 0.9 - 3.3 10e3/uL   MONO# 0.6 0.1 - 0.9 10e3/uL   Eosinophils Absolute 0.0 0.0 - 0.5 10e3/uL   Basophils Absolute 0.0 0.0 - 0.1 10e3/uL   NEUT% 30.8 (L) 38.4 - 76.8 %   LYMPH% 39.5 14.0 - 49.7 %   MONO% 27.7 (H) 0.0 - 14.0 %   EOS% 1.8 0.0 - 7.0 %   BASO% 0.2 0.0 - 2.0 %  Comprehensive metabolic panel  Result Value Ref Range   Sodium 136 136 - 145 mEq/L   Potassium 4.0 3.5 - 5.1 mEq/L   Chloride 102 98 - 109 mEq/L   CO2 26 22 - 29 mEq/L   Glucose 107 70 - 140 mg/dl   BUN 7.0 7.0 - 26.0 mg/dL   Creatinine 0.7 0.6 - 1.1 mg/dL   Total Bilirubin 0.99 0.20 - 1.20 mg/dL   Alkaline Phosphatase 111 40 - 150 U/L   AST 31 5 - 34 U/L   ALT 24 0 - 55 U/L   Total Protein 6.9 6.4 - 8.3 g/dL   Albumin 3.0 (L) 3.5 - 5.0 g/dL   Calcium 9.0 8.4 - 10.4 mg/dL   Anion Gap 8 3 - 11 mEq/L   EGFR >60 >60 ml/min/1.73 m2   Dg Humerus Right  Result Date: 06/05/2017 CLINICAL DATA:  Right upper arm pain after fall today. EXAM: RIGHT HUMERUS - 2+ VIEW COMPARISON:  None. FINDINGS: Moderately displaced fracture is seen involving the greater tuberosity of proximal right humerus. Possible nondisplaced humeral neck fracture cannot be excluded. No dislocation is noted. IMPRESSION: Moderately displaced fracture involving greater tuberosity of proximal right  humerus. Possible nondisplaced right humeral neck fracture cannot be excluded; CT scan is recommended for further evaluation. Electronically Signed   By: Marijo Conception, M.D.   On: 06/05/2017 15:25   Dg Hip Unilat With Pelvis 2-3 Views Right  Result Date: 06/05/2017 CLINICAL DATA:  76 y/o  F; fall with right hip pain. EXAM: DG HIP (WITH OR WITHOUT PELVIS) 2-3V RIGHT COMPARISON:  None. FINDINGS: Right total hip prosthesis, no periprosthetic lucency or fracture identified. Mild left hip osteoarthrosis with periarticular osteophytes. No pelvic fracture or diastases identified. IMPRESSION: Right total hip prosthesis. No acute fracture or dislocation identified. Electronically Signed   By: Kristine Garbe M.D.   On: 06/05/2017 21:04    Patient seen by me along with physician assistant.  The patient presents to the emergency department with a known right arm fracture.  Patient tripped in route to radiology today while getting on elevator.  Patient also had x-rays of her bilateral hips and pelvis without any acute findings.  There is evidence of a moderately  displaced fracture involving the greater tuberosity of the proximal right humerus.  Possibly nondisplaced right humeral neck fracture.  Patient will be treated with sling shoulder immobilizer and follow-up with orthopedics.  Patient nontoxic no acute distress.  Right arm is in a sling.  Good radial pulse good cap refill sensation intact good movement at the fingers wrist and some at the elbow.  Heart regular lungs clear abdomen soft.   Fredia Sorrow, MD 06/05/17 2149

## 2017-06-05 NOTE — ED Triage Notes (Addendum)
Pt presents to emergency room with known right arm fracture; tripped en route to radiology today while getting on elevator. Pt has mask on.

## 2017-06-05 NOTE — Progress Notes (Addendum)
Radiation Oncology         [phone] ________________________________  Name: Jacqueline Kidd MRN: [id number]  Date: 06/05/2017  DOB: 1941-04-15  Post Treatment Note  CC: [name], MD  [name], MD  Diagnosis:   76 y.o. female with probable stage IV NSCLC of the left upper lung with neck and mediastinal nodal disease and 2 brain metastases.   Interval Since Last Radiation:  4 weeks  1.  Chest radiation 03/23/2017 - 05/06/2017- The primary tumor and involved mediastinal adenopathy were treated to 66 Gy in 33 fractions of 2 Gy.  2.  SRS on 04/03/17: Left cerebellar 12 mm target and Left insular 7 mm target were treated to a prescription dose of 20 Gy.  Narrative:  The patient returns today for routine follow-up.  She tolerated radiation treatment relatively well.  She experienced some esophagitis characterized as 10/10 burning pain. She subsequently had difficulty/pain with swallowing and was mostly unable to swallow solid food without it feeling like it got stuck in her esophagus. She was given carafate and lidocaine for relief and advised to try softer foods which was helpful. She also reported an occasional productive cough with bubbly phlegm that sometimes caused her to vomit. She denied any shortness of breath. The patient also noted significant fatigue. Hyperpigmentation with dry desquamation was noted within the treatment field. She reported using radiaplex, neosporin, and hydrocortisone on affected skin.                           On review of systems, the patient states that she has been doing fairly well overall. She has completed 3 cycles of systemic chemotherapy with carboplatin and paclitaxel every 3 weeks; first dose on 04/15/2017. She denies recent fevers, chills, chest pain, increased shortness of breath, cough or hemoptysis.  She has had mild persistent dysphagia for which she uses Carafate with relief. She continues with a decreased appetite and has lost a few pounds in  the interim. She has noticed some numbness and tingling in her fingers/hands bilaterally which she associates with chemotherapy.  She denies headaches, visual or auditory changes, tinnitus, dizziness, tremors, seizure activity or imbalance.   Unfortunately, she fell upstairs getting on the elevator on her way to her appointment here in our clinic today.  She reports that her [email or web address] go up on the threshold and she fell forward but her right arm went backwards and she then fell onto her right side. Her husband was with her during this episode. She is now complaining of moderate to severe pain in the right upper arm around her shoulder but denies any numbness or tingling down the arm or into the hand. She is unable to move the right arm without pain.  She rates the pain as an 8 out of 10 in severity.  ALLERGIES:  is allergic to fosamax [alendronate]; hydrocodone-acetaminophen; and lisinopril-hydrochlorothiazide.  Meds: Current Outpatient Medications  Medication Sig Dispense Refill  . acetaminophen (TYLENOL) 325 MG tablet Take 650 mg by mouth every 6 (six) hours as needed for mild pain or moderate pain.     Marland Kitchen amLODipine (NORVASC) 2.5 MG tablet Take 1 tablet (2.5 mg total) by mouth daily. (Patient not taking: Reported on 06/05/2017) 90 tablet 1  . aspirin EC 81 MG tablet Take 81 mg by mouth daily.    . cholecalciferol (VITAMIN D) 1000 units tablet Take 1,000 Units by mouth daily.    Marland Kitchen lidocaine (XYLOCAINE) 2 %  solution Use as directed 5 mLs in the mouth or throat as needed for mouth pain. Mix with 5 mL water in 10 mL syringe and instill past tongue (Patient not taking: Reported on 05/27/2017) 200 mL 5  . LORazepam (ATIVAN) 0.5 MG tablet Take 1 tablet (0.5 mg total) by mouth as needed for anxiety (30 min prior to radiotherapy treatment; may repeat after 30 min if needed). (Patient not taking: Reported on 06/05/2017) 2 tablet 0  . Omega-3 Fatty Acids (FISH OIL) 1000 MG CAPS Take 1 capsule by mouth daily.      Marland Kitchen oxyCODONE-acetaminophen (PERCOCET/ROXICET) 5-325 MG tablet Take 1-2 tablets by mouth every 4 (four) hours as needed for severe pain. 30 tablet 0  . prochlorperazine (COMPAZINE) 10 MG tablet Take 1 tablet (10 mg total) by mouth every 6 (six) hours as needed for nausea or vomiting. (Patient not taking: Reported on 05/27/2017) 30 tablet 1  . sucralfate (CARAFATE) 1 g tablet Take 1 tablet (1 g total) by mouth 4 (four) times daily -  with meals and at bedtime. 5 min before meals for radiation induced esophagitis (Patient not taking: Reported on 05/27/2017) 120 tablet 2  . traMADol (ULTRAM) 50 MG tablet Take 1 tablet (50 mg total) by mouth every 8 (eight) hours as needed. (Patient not taking: Reported on 06/05/2017) 15 tablet 0   No current facility-administered medications for this encounter.     Physical Findings:  height is 5' 1.5" (1.562 m). Her oral temperature is 97.8 F (36.6 C). Her blood pressure is 106/57 (abnormal) and her pulse is 93. Her respiration is 18 and oxygen saturation is 97%.  Pain Assessment Pain Score: 6  Pain Loc: Arm/10 In general this is a well appearing caucasian female in no acute distress. She's alert and oriented x4 and appropriate throughout the examination. Cardiopulmonary assessment is negative for acute distress and she exhibits normal effort. She appears grossly neurologically intact.  Lab Findings: Lab Results  Component Value Date   WBC 2.2 (L) 06/03/2017   HGB 9.6 (L) 06/03/2017   HCT 29.0 (L) 06/03/2017   MCV 96.9 06/03/2017   PLT 61 (L) 06/03/2017     Radiographic Findings: Dg Humerus Right  Result Date: 06/05/2017 CLINICAL DATA:  Right upper arm pain after fall today. EXAM: RIGHT HUMERUS - 2+ VIEW COMPARISON:  None. FINDINGS: Moderately displaced fracture is seen involving the greater tuberosity of proximal right humerus. Possible nondisplaced humeral neck fracture cannot be excluded. No dislocation is noted. IMPRESSION: Moderately displaced  fracture involving greater tuberosity of proximal right humerus. Possible nondisplaced right humeral neck fracture cannot be excluded; CT scan is recommended for further evaluation. Electronically Signed   By: Marijo Conception, M.D.   On: 06/05/2017 15:25    Impression/Plan: 1. 76 y.o. female with probable stage IV NSCLC of the left upper lung with neck and mediastinal nodal disease and 2 brain metastases.   She appears to have recovered well from the effects of radiotherapy. She is scheduled for a repeat CT chest/abdomen/pelvis on 06/15/17 and follow-up with Dr. Julien Nordmann on 06/17/17. She will continue routine follow-up and disease management under the care and direction of Dr. Julien Nordmann regarding her systemic disease.  We will plan to repeat a brain MRI in 2 months time to assess for treatment response. These images will be reviewed in the multidisciplinary brain and spine conference prior to a follow-up visit in our office to review results and recommendations.  She is in agreement with and is comfortable with  this plan. 2. Right proximal humerus fracture.  She was noted to have a moderately displaced fracture involving the greater tuberosity of the right proximal humerus and possibly a nondisplaced humeral head fracture on diagnostic radiographs obtained today. Based on these results, we will send the patient to the emergency department for further evaluation and assessment by an orthopedic specialist regarding the need for further imaging and treatment recommendations. She was given Percocet 5/325 mg today in the office for pain relief. She was also provided a prescription of Percocet to have for home use as needed.  She was escorted to the emergency Department by London Pepper, RN.    [name], PA-C

## 2017-06-05 NOTE — ED Notes (Signed)
Will update vital signs when pt comes back from CT

## 2017-06-05 NOTE — Discharge Instructions (Signed)
Contact a health care provider if: You have any new pain, swelling, or bruising. Your pain, swelling, and bruising do not improve. Your cast, splint, or sling becomes loose or damaged. Get help right away if: Your skin or fingers on your injured arm turn blue or gray. Your arm feels cold or numb. You have severe pain in your injured arm.

## 2017-06-10 ENCOUNTER — Other Ambulatory Visit (HOSPITAL_BASED_OUTPATIENT_CLINIC_OR_DEPARTMENT_OTHER): Payer: Medicare Other

## 2017-06-10 DIAGNOSIS — C3412 Malignant neoplasm of upper lobe, left bronchus or lung: Secondary | ICD-10-CM | POA: Diagnosis not present

## 2017-06-10 DIAGNOSIS — C778 Secondary and unspecified malignant neoplasm of lymph nodes of multiple regions: Secondary | ICD-10-CM

## 2017-06-10 DIAGNOSIS — C7931 Secondary malignant neoplasm of brain: Secondary | ICD-10-CM

## 2017-06-10 DIAGNOSIS — C3492 Malignant neoplasm of unspecified part of left bronchus or lung: Secondary | ICD-10-CM

## 2017-06-10 LAB — COMPREHENSIVE METABOLIC PANEL
ALBUMIN: 2.8 g/dL — AB (ref 3.5–5.0)
ALK PHOS: 114 U/L (ref 40–150)
ALT: 12 U/L (ref 0–55)
ANION GAP: 8 meq/L (ref 3–11)
AST: 20 U/L (ref 5–34)
BILIRUBIN TOTAL: 0.97 mg/dL (ref 0.20–1.20)
BUN: 6.9 mg/dL — ABNORMAL LOW (ref 7.0–26.0)
CO2: 23 mEq/L (ref 22–29)
CREATININE: 0.6 mg/dL (ref 0.6–1.1)
Calcium: 8.7 mg/dL (ref 8.4–10.4)
Chloride: 105 mEq/L (ref 98–109)
GLUCOSE: 112 mg/dL (ref 70–140)
Potassium: 4.3 mEq/L (ref 3.5–5.1)
SODIUM: 136 meq/L (ref 136–145)
TOTAL PROTEIN: 6.7 g/dL (ref 6.4–8.3)

## 2017-06-10 LAB — CBC WITH DIFFERENTIAL/PLATELET
BASO%: 0.1 % (ref 0.0–2.0)
BASOS ABS: 0 10*3/uL (ref 0.0–0.1)
EOS%: 0.4 % (ref 0.0–7.0)
Eosinophils Absolute: 0 10*3/uL (ref 0.0–0.5)
HCT: 27.3 % — ABNORMAL LOW (ref 34.8–46.6)
HEMOGLOBIN: 8.7 g/dL — AB (ref 11.6–15.9)
LYMPH#: 1 10*3/uL (ref 0.9–3.3)
LYMPH%: 11.9 % — ABNORMAL LOW (ref 14.0–49.7)
MCH: 32.8 pg (ref 25.1–34.0)
MCHC: 31.9 g/dL (ref 31.5–36.0)
MCV: 103 fL — AB (ref 79.5–101.0)
MONO#: 0.7 10*3/uL (ref 0.1–0.9)
MONO%: 9 % (ref 0.0–14.0)
NEUT%: 78.6 % — ABNORMAL HIGH (ref 38.4–76.8)
NEUTROS ABS: 6.3 10*3/uL (ref 1.5–6.5)
PLATELETS: 77 10*3/uL — AB (ref 145–400)
RBC: 2.65 10*6/uL — ABNORMAL LOW (ref 3.70–5.45)
RDW: 20.5 % — AB (ref 11.2–14.5)
WBC: 8 10*3/uL (ref 3.9–10.3)

## 2017-06-15 ENCOUNTER — Ambulatory Visit (HOSPITAL_COMMUNITY): Admission: RE | Admit: 2017-06-15 | Payer: Medicare Other | Source: Ambulatory Visit

## 2017-06-15 ENCOUNTER — Other Ambulatory Visit: Payer: Medicare Other

## 2017-06-17 ENCOUNTER — Encounter: Payer: Self-pay | Admitting: Oncology

## 2017-06-17 ENCOUNTER — Ambulatory Visit (HOSPITAL_BASED_OUTPATIENT_CLINIC_OR_DEPARTMENT_OTHER): Payer: Medicare Other

## 2017-06-17 ENCOUNTER — Ambulatory Visit (HOSPITAL_BASED_OUTPATIENT_CLINIC_OR_DEPARTMENT_OTHER): Payer: Medicare Other | Admitting: Oncology

## 2017-06-17 ENCOUNTER — Other Ambulatory Visit (HOSPITAL_BASED_OUTPATIENT_CLINIC_OR_DEPARTMENT_OTHER): Payer: Medicare Other

## 2017-06-17 ENCOUNTER — Encounter: Payer: Self-pay | Admitting: *Deleted

## 2017-06-17 VITALS — BP 118/62 | HR 92 | Temp 98.4°F | Resp 20 | Wt 142.6 lb

## 2017-06-17 DIAGNOSIS — G620 Drug-induced polyneuropathy: Secondary | ICD-10-CM | POA: Diagnosis not present

## 2017-06-17 DIAGNOSIS — C3412 Malignant neoplasm of upper lobe, left bronchus or lung: Secondary | ICD-10-CM

## 2017-06-17 DIAGNOSIS — Z5111 Encounter for antineoplastic chemotherapy: Secondary | ICD-10-CM | POA: Diagnosis not present

## 2017-06-17 DIAGNOSIS — C7931 Secondary malignant neoplasm of brain: Secondary | ICD-10-CM

## 2017-06-17 DIAGNOSIS — C3492 Malignant neoplasm of unspecified part of left bronchus or lung: Secondary | ICD-10-CM

## 2017-06-17 LAB — COMPREHENSIVE METABOLIC PANEL
ALBUMIN: 3 g/dL — AB (ref 3.5–5.0)
ALK PHOS: 126 U/L (ref 40–150)
ALT: 13 U/L (ref 0–55)
ANION GAP: 9 meq/L (ref 3–11)
AST: 24 U/L (ref 5–34)
BILIRUBIN TOTAL: 0.88 mg/dL (ref 0.20–1.20)
BUN: 8 mg/dL (ref 7.0–26.0)
CO2: 23 meq/L (ref 22–29)
CREATININE: 0.7 mg/dL (ref 0.6–1.1)
Calcium: 9 mg/dL (ref 8.4–10.4)
Chloride: 105 mEq/L (ref 98–109)
EGFR: >60 mL/min/{1.73_m2} (ref >60)
GLUCOSE: 125 mg/dL (ref 70–140)
Potassium: 3.8 mEq/L (ref 3.5–5.1)
Sodium: 137 mEq/L (ref 136–145)
TOTAL PROTEIN: 7.2 g/dL (ref 6.4–8.3)

## 2017-06-17 LAB — CBC WITH DIFFERENTIAL/PLATELET
BASO%: 0.5 % (ref 0.0–2.0)
BASOS ABS: 0 10*3/uL (ref 0.0–0.1)
EOS%: 0.5 % (ref 0.0–7.0)
Eosinophils Absolute: 0 10*3/uL (ref 0.0–0.5)
HCT: 30.1 % — ABNORMAL LOW (ref 34.8–46.6)
HGB: 9.5 g/dL — ABNORMAL LOW (ref 11.6–15.9)
LYMPH%: 26.3 % (ref 14.0–49.7)
MCH: 33.3 pg (ref 25.1–34.0)
MCHC: 31.6 g/dL (ref 31.5–36.0)
MCV: 105.6 fL — ABNORMAL HIGH (ref 79.5–101.0)
MONO#: 0.6 10*3/uL (ref 0.1–0.9)
MONO%: 15.4 % — AB (ref 0.0–14.0)
NEUT#: 2.4 10*3/uL (ref 1.5–6.5)
NEUT%: 57.3 % (ref 38.4–76.8)
Platelets: 139 10*3/uL — ABNORMAL LOW (ref 145–400)
RBC: 2.85 10*6/uL — ABNORMAL LOW (ref 3.70–5.45)
RDW: 21.8 % — AB (ref 11.2–14.5)
WBC: 4.1 10*3/uL (ref 3.9–10.3)
lymph#: 1.1 10*3/uL (ref 0.9–3.3)

## 2017-06-17 MED ORDER — PALONOSETRON HCL INJECTION 0.25 MG/5ML
0.2500 mg | Freq: Once | INTRAVENOUS | Status: AC
  Administered 2017-06-17: 0.25 mg via INTRAVENOUS

## 2017-06-17 MED ORDER — SODIUM CHLORIDE 0.9 % IV SOLN
20.0000 mg | Freq: Once | INTRAVENOUS | Status: AC
  Administered 2017-06-17: 20 mg via INTRAVENOUS
  Filled 2017-06-17: qty 2

## 2017-06-17 MED ORDER — SODIUM CHLORIDE 0.9 % IV SOLN
150.0000 mg/m2 | Freq: Once | INTRAVENOUS | Status: AC
  Administered 2017-06-17: 270 mg via INTRAVENOUS
  Filled 2017-06-17: qty 45

## 2017-06-17 MED ORDER — SODIUM CHLORIDE 0.9 % IV SOLN
Freq: Once | INTRAVENOUS | Status: AC
  Administered 2017-06-17: 11:00:00 via INTRAVENOUS

## 2017-06-17 MED ORDER — DIPHENHYDRAMINE HCL 50 MG/ML IJ SOLN
INTRAMUSCULAR | Status: AC
  Filled 2017-06-17: qty 1

## 2017-06-17 MED ORDER — DIPHENHYDRAMINE HCL 50 MG/ML IJ SOLN
50.0000 mg | Freq: Once | INTRAMUSCULAR | Status: AC
  Administered 2017-06-17: 50 mg via INTRAVENOUS

## 2017-06-17 MED ORDER — FAMOTIDINE IN NACL 20-0.9 MG/50ML-% IV SOLN
INTRAVENOUS | Status: AC
  Filled 2017-06-17: qty 50

## 2017-06-17 MED ORDER — FAMOTIDINE IN NACL 20-0.9 MG/50ML-% IV SOLN
20.0000 mg | Freq: Once | INTRAVENOUS | Status: AC
  Administered 2017-06-17: 20 mg via INTRAVENOUS

## 2017-06-17 MED ORDER — GABAPENTIN 100 MG PO CAPS: 100.0000 mg | ORAL_CAPSULE | Freq: Three times a day (TID) | ORAL | 0 refills | Status: DC

## 2017-06-17 MED ORDER — PALONOSETRON HCL INJECTION 0.25 MG/5ML
INTRAVENOUS | Status: AC
  Filled 2017-06-17: qty 5

## 2017-06-17 MED ORDER — SODIUM CHLORIDE 0.9 % IV SOLN
404.5000 mg | Freq: Once | INTRAVENOUS | Status: AC
  Administered 2017-06-17: 400 mg via INTRAVENOUS
  Filled 2017-06-17: qty 40

## 2017-06-17 NOTE — Patient Instructions (Signed)
[facility] Discharge Instructions for Patients Receiving Chemotherapy  Today you received the following chemotherapy agents: Taxol & Carboplatin  To help prevent nausea and vomiting after your treatment, we encourage you to take your nausea medication as directed   If you develop nausea and vomiting that is not controlled by your nausea medication, call the clinic.   BELOW ARE SYMPTOMS THAT SHOULD BE REPORTED IMMEDIATELY:  *FEVER GREATER THAN 100.5 F  *CHILLS WITH OR WITHOUT FEVER  NAUSEA AND VOMITING THAT IS NOT CONTROLLED WITH YOUR NAUSEA MEDICATION  *UNUSUAL SHORTNESS OF BREATH  *UNUSUAL BRUISING OR BLEEDING  TENDERNESS IN MOUTH AND THROAT WITH OR WITHOUT PRESENCE OF ULCERS  *URINARY PROBLEMS  *BOWEL PROBLEMS  UNUSUAL RASH Items with * indicate a potential emergency and should be followed up as soon as possible.  Feel free to call the clinic should you have any questions or concerns. The clinic phone number is [phone].  Please show the [facility] at check-in to the Emergency Department and triage nurse.

## 2017-06-17 NOTE — Assessment & Plan Note (Signed)
This is a very pleasant 76 year old white female with stage IV (T2b, N3, M1 C) non-small cell lung cancer, squamous cell carcinoma presented with large left upper lobe lung mass in addition to mediastinal and supraclavicular lymphadenopathy as well as metastatic brain lesions diagnosed in September 2018. The patient underwent a course of palliative radiotherapy to the left upper lobe lung mass as well as the metastatic brain lesions. She is currently undergoing palliative systemic chemotherapy with carboplatin for AC of 5 and paclitaxel 175 mg/M2 every 3 weeks, status post 3 cycles. She continues to tolerate her treatment fairly well except for peripheral neuropathy.  The patient was seen with Dr. Julien Nordmann.  Will reduce the paclitaxel dose to 150 mg/m due to the peripheral neuropathy.  Patient was also given a prescription for gabapentin 100 mg 3 times a day to help with the peripheral neuropathy.  Side effects were reviewed with the patient and her son. She will proceed with cycle 4 of her chemotherapy today with a dose reduction as noted above. She was advised to proceed with her CT scan as scheduled later this week. She will continue to have weekly labs.  She will have a return visit in 3 weeks for evaluation prior to cycle 5 of her chemotherapy and to review her restaging CT scan results.  She was advised to follow-up with orthopedics as directed for her right humerus fracture.  The patient was advised to call immediately if she has any concerning symptoms in the interval. The patient and her husband agreed to the current plan. The patient voices understanding of current disease status and treatment options and is in agreement with the current care plan. All questions were answered. The patient knows to call the clinic with any problems, questions or concerns. We can certainly see the patient much sooner if necessary.

## 2017-06-17 NOTE — Progress Notes (Signed)
[place] OFFICE PROGRESS NOTE  [place], [place] [address]  DIAGNOSIS: stage IV(T2b, N3, M1c)  lung cancer pending further staging workup and tissue diagnosis. The patient presented with large left upper lobe lung mass in addition to left hilar and mediastinal lymphadenopathy and left true vocal cord paralysis.  PRIOR THERAPY: Palliative radiotherapy to the large left upper lobe lung mass as well as metastatic brain lesions.  CURRENT THERAPY: systemic chemotherapy with carboplatin for AUC of 5 and paclitaxel 175 MG/M2 every 3 weeks. First dose on 04/15/2017.  Paclitaxel dose reduced to 150 mg/m beginning with cycle #4 due to peripheral neuropathy.  INTERVAL HISTORY: Jacqueline Kidd 76 y.o. female returns for routine follow-up visit accompanied by her son.  Since last visit, the patient fell coming out of an elevator and broke her right humerus.  And put in a sling.  She was advised to follow-up with [facility] orthopedics, but has not yet done this.  She does not see the point with and does not think that they will do anything to help her.  Patient still has ongoing pain to her right arm.  The patient is also complaining of tenderness and pain in her fingertips and feet.  She reports that she cannot even put her earrings in cannot grasp small things.  She denies fevers and chills.  Denies chest pain, shortness breath, cough, hemoptysis.  Denies nausea, vomiting, constipation, diarrhea.  The patient was supposed to have a repeat CT scan prior to this visit, but it was canceled due to the snowstorm.  Patient is here for evaluation prior to cycle #4 of her chemotherapy.  MEDICAL HISTORY: Past Medical History:  Diagnosis Date  . Chronic low back pain   . COPD (chronic obstructive pulmonary disease) ([place])   . GERD (gastroesophageal reflux disease)   . Hyperlipidemia   . Hypertension   . Lesion of left lung   . Osteoporosis     ALLERGIES:  is allergic  to fosamax [alendronate]; hydrocodone-acetaminophen; and lisinopril-hydrochlorothiazide.  MEDICATIONS:  Current Outpatient Medications  Medication Sig Dispense Refill  . acetaminophen (TYLENOL) 325 MG tablet Take 650 mg by mouth every 6 (six) hours as needed for mild pain or moderate pain.     Marland Kitchen amLODipine (NORVASC) 2.5 MG tablet Take 1 tablet (2.5 mg total) by mouth daily. (Patient not taking: Reported on 06/05/2017) 90 tablet 1  . aspirin EC 81 MG tablet Take 81 mg by mouth daily.    . cholecalciferol (VITAMIN D) 1000 units tablet Take 1,000 Units by mouth daily.    Marland Kitchen gabapentin (NEURONTIN) 100 MG capsule Take 1 capsule (100 mg total) by mouth 3 (three) times daily. 90 capsule 0  . lidocaine (XYLOCAINE) 2 % solution Use as directed 5 mLs in the mouth or throat as needed for mouth pain. Mix with 5 mL water in 10 mL syringe and instill past tongue (Patient not taking: Reported on 05/27/2017) 200 mL 5  . LORazepam (ATIVAN) 0.5 MG tablet Take 1 tablet (0.5 mg total) by mouth as needed for anxiety (30 min prior to radiotherapy treatment; may repeat after 30 min if needed). 2 tablet 0  . Omega-3 Fatty Acids (FISH OIL) 1000 MG CAPS Take 1 capsule by mouth daily.    Marland Kitchen oxyCODONE-acetaminophen (PERCOCET/ROXICET) 5-325 MG tablet Take 1-2 tablets by mouth every 4 (four) hours as needed for severe pain. 30 tablet 0  . prochlorperazine (COMPAZINE) 10 MG tablet Take 1 tablet (10  mg total) by mouth every 6 (six) hours as needed for nausea or vomiting. 30 tablet 1  . sucralfate (CARAFATE) 1 g tablet Take 1 tablet (1 g total) by mouth 4 (four) times daily -  with meals and at bedtime. 5 min before meals for radiation induced esophagitis (Patient not taking: Reported on 05/27/2017) 120 tablet 2  . traMADol (ULTRAM) 50 MG tablet Take 1 tablet (50 mg total) by mouth every 8 (eight) hours as needed. (Patient not taking: Reported on 06/05/2017) 15 tablet 0   No current facility-administered medications for this visit.      SURGICAL HISTORY:  Past Surgical History:  Procedure Laterality Date  . ABDOMINAL HYSTERECTOMY    . TOTAL HIP ARTHROPLASTY     right    REVIEW OF SYSTEMS:   Review of Systems  Constitutional: Negative for appetite change, chills, fatigue, fever and unexpected weight change.  HENT:   Negative for mouth sores, nosebleeds, sore throat and trouble swallowing.   Eyes: Negative for eye problems and icterus.  Respiratory: Negative for cough, hemoptysis, shortness of breath and wheezing.   Cardiovascular: Negative for chest pain and leg swelling.  Gastrointestinal: Negative for abdominal pain, constipation, diarrhea, nausea and vomiting.  Genitourinary: Negative for bladder incontinence, difficulty urinating, dysuria, frequency and hematuria.   Musculoskeletal: Negative for back pain, gait problem, neck pain and neck stiffness. Positive for right arm pain. Skin: Negative for itching and rash.  Neurological: Negative for dizziness, extremity weakness, gait problem, headaches, light-headedness and seizures.  Hematological: Negative for adenopathy. Does not bruise/bleed easily.  Psychiatric/Behavioral: Negative for confusion, depression and sleep disturbance. The patient is not nervous/anxious.     PHYSICAL EXAMINATION:  Blood pressure 118/62, pulse 92, temperature 98.4 F (36.9 C), temperature source Oral, resp. rate 20, weight 142 lb 9.6 oz (64.7 kg), SpO2 94 %.  ECOG PERFORMANCE STATUS: 1 - Symptomatic but completely ambulatory  Physical Exam  Constitutional: Oriented to person, place, and time and well-developed, well-nourished, and in no distress. No distress.  HENT:  Head: Normocephalic and atraumatic.  Mouth/Throat: Oropharynx is clear and moist. No oropharyngeal exudate.  Eyes: Conjunctivae are normal. Right eye exhibits no discharge. Left eye exhibits no discharge. No scleral icterus.  Neck: Normal range of motion. Neck supple.  Cardiovascular: Normal rate, regular rhythm,  normal heart sounds and intact distal pulses.   Pulmonary/Chest: Effort normal and breath sounds normal. No respiratory distress. No wheezes. No rales.  Abdominal: Soft. Bowel sounds are normal. Exhibits no distension and no mass. There is no tenderness.  Musculoskeletal: Normal range of motion. Exhibits no edema.  Lymphadenopathy:    No cervical adenopathy.  Neurological: Alert and oriented to person, place, and time. Exhibits normal muscle tone. Coordination normal.  Skin: Skin is warm and dry. No rash noted. Not diaphoretic. No erythema. No pallor.  Psychiatric: Mood, memory and judgment normal.  Vitals reviewed.  LABORATORY DATA: Lab Results  Component Value Date   WBC 4.1 06/17/2017   HGB 9.5 (L) 06/17/2017   HCT 30.1 (L) 06/17/2017   MCV 105.6 (H) 06/17/2017   PLT 139 (L) 06/17/2017      Chemistry      Component Value Date/Time   NA 137 06/17/2017 0921   K 3.8 06/17/2017 0921   CL 101 07/04/2016 1211   CO2 23 06/17/2017 0921   BUN 8.0 06/17/2017 0921   CREATININE 0.7 06/17/2017 0921      Component Value Date/Time   CALCIUM 9.0 06/17/2017 0921   ALKPHOS 126  06/17/2017 0921   AST 24 06/17/2017 0921   ALT 13 06/17/2017 0921   BILITOT 0.88 06/17/2017 0921       RADIOGRAPHIC STUDIES:  Dg Humerus Right  Result Date: 06/05/2017 CLINICAL DATA:  Right upper arm pain after fall today. EXAM: RIGHT HUMERUS - 2+ VIEW COMPARISON:  None. FINDINGS: Moderately displaced fracture is seen involving the greater tuberosity of proximal right humerus. Possible nondisplaced humeral neck fracture cannot be excluded. No dislocation is noted. IMPRESSION: Moderately displaced fracture involving greater tuberosity of proximal right humerus. Possible nondisplaced right humeral neck fracture cannot be excluded; CT scan is recommended for further evaluation. Electronically Signed   By: Marijo Conception, M.D.   On: 06/05/2017 15:25   Dg Hip Unilat With Pelvis 2-3 Views Right  Result Date:  06/05/2017 CLINICAL DATA:  76 y/o  F; fall with right hip pain. EXAM: DG HIP (WITH OR WITHOUT PELVIS) 2-3V RIGHT COMPARISON:  None. FINDINGS: Right total hip prosthesis, no periprosthetic lucency or fracture identified. Mild left hip osteoarthrosis with periarticular osteophytes. No pelvic fracture or diastases identified. IMPRESSION: Right total hip prosthesis. No acute fracture or dislocation identified. Electronically Signed   By: Kristine Garbe M.D.   On: 06/05/2017 21:04     ASSESSMENT/PLAN:  Primary cancer of left upper lobe of lung [facility]) This is a very pleasant 76 year old white female with stage IV (T2b, N3, M1 C) non-small cell lung cancer, squamous cell carcinoma presented with large left upper lobe lung mass in addition to mediastinal and supraclavicular lymphadenopathy as well as metastatic brain lesions diagnosed in September 2018. The patient underwent a course of palliative radiotherapy to the left upper lobe lung mass as well as the metastatic brain lesions. She is currently undergoing palliative systemic chemotherapy with carboplatin for AC of 5 and paclitaxel 175 mg/M2 every 3 weeks, status post 3 cycles. She continues to tolerate her treatment fairly well except for peripheral neuropathy.  The patient was seen with Dr. Julien Nordmann.  Will reduce the paclitaxel dose to 150 mg/m due to the peripheral neuropathy.  Patient was also given a prescription for gabapentin 100 mg 3 times a day to help with the peripheral neuropathy.  Side effects were reviewed with the patient and her son. She will proceed with cycle 4 of her chemotherapy today with a dose reduction as noted above. She was advised to proceed with her CT scan as scheduled later this week. She will continue to have weekly labs.  She will have a return visit in 3 weeks for evaluation prior to cycle 5 of her chemotherapy and to review her restaging CT scan results.  She was advised to follow-up with orthopedics as  directed for her right humerus fracture.  The patient was advised to call immediately if she has any concerning symptoms in the interval. The patient and her husband agreed to the current plan. The patient voices understanding of current disease status and treatment options and is in agreement with the current care plan. All questions were answered. The patient knows to call the clinic with any problems, questions or concerns. We can certainly see the patient much sooner if necessary.  No orders of the defined types were placed in this encounter.  Mikey Bussing, DNP, AGPCNP-BC, AOCNP 06/17/17  ADDENDUM: Hematology/Oncology Attending: I had a face-to-face encounter with the patient today.  I recommended her care plan.  This is a very pleasant 76 years old white female with a stage IV non-small cell lung cancer, squamous  cell carcinoma status post palliative radiation to the left upper lobe lung mass as well as metastatic brain lesions.  She is currently undergoing systemic chemotherapy with carboplatin and paclitaxel status post 3 cycles.  She is tolerating this treatment fairly well except for mild to moderate peripheral neuropathy. She was supposed to have repeat CT scan of the chest before this visit but because of the snow storm, this was delayed until June 19, 2017. I recommended for the patient to proceed with cycle #4 today.  I would reduce the dose of paclitaxel to 150 MG/M2 because of the peripheral neuropathy.  We also started the patient on Neurontin 100 mg p.o. 3 times daily for the peripheral neuropathy. I would see her back for follow-up visit in 3 weeks for evaluation before starting cycle #5. She was advised to call immediately if she has any concerning symptoms in the interval. Eilleen Kempf, MD 06/17/17

## 2017-06-18 ENCOUNTER — Other Ambulatory Visit: Payer: Self-pay | Admitting: *Deleted

## 2017-06-18 DIAGNOSIS — Z006 Encounter for examination for normal comparison and control in clinical research program: Secondary | ICD-10-CM

## 2017-06-19 ENCOUNTER — Ambulatory Visit (HOSPITAL_BASED_OUTPATIENT_CLINIC_OR_DEPARTMENT_OTHER): Payer: Medicare Other

## 2017-06-19 ENCOUNTER — Ambulatory Visit (HOSPITAL_COMMUNITY)
Admission: RE | Admit: 2017-06-19 | Discharge: 2017-06-19 | Disposition: A | Discharge status: Home / Self Care | Payer: Medicare Other | Source: Ambulatory Visit | Attending: Internal Medicine | Admitting: Internal Medicine

## 2017-06-19 ENCOUNTER — Encounter (HOSPITAL_COMMUNITY): Payer: Self-pay

## 2017-06-19 VITALS — BP 112/61 | HR 98 | Temp 98.5°F | Resp 20

## 2017-06-19 DIAGNOSIS — C3412 Malignant neoplasm of upper lobe, left bronchus or lung: Secondary | ICD-10-CM | POA: Diagnosis not present

## 2017-06-19 DIAGNOSIS — C778 Secondary and unspecified malignant neoplasm of lymph nodes of multiple regions: Secondary | ICD-10-CM | POA: Diagnosis not present

## 2017-06-19 DIAGNOSIS — C7931 Secondary malignant neoplasm of brain: Secondary | ICD-10-CM | POA: Diagnosis not present

## 2017-06-19 DIAGNOSIS — R590 Localized enlarged lymph nodes: Secondary | ICD-10-CM | POA: Insufficient documentation

## 2017-06-19 DIAGNOSIS — I700 Atherosclerosis of aorta: Secondary | ICD-10-CM | POA: Diagnosis not present

## 2017-06-19 DIAGNOSIS — I2510 Atherosclerotic heart disease of native coronary artery without angina pectoris: Secondary | ICD-10-CM | POA: Insufficient documentation

## 2017-06-19 DIAGNOSIS — Z90710 Acquired absence of both cervix and uterus: Secondary | ICD-10-CM | POA: Diagnosis not present

## 2017-06-19 DIAGNOSIS — C3490 Malignant neoplasm of unspecified part of unspecified bronchus or lung: Secondary | ICD-10-CM | POA: Diagnosis not present

## 2017-06-19 DIAGNOSIS — Z5111 Encounter for antineoplastic chemotherapy: Secondary | ICD-10-CM | POA: Diagnosis not present

## 2017-06-19 MED ORDER — PEGFILGRASTIM INJECTION 6 MG/0.6ML ~~LOC~~
6.0000 mg | PREFILLED_SYRINGE | Freq: Once | SUBCUTANEOUS | Status: AC
  Administered 2017-06-19: 6 mg via SUBCUTANEOUS

## 2017-06-19 MED ORDER — PEGFILGRASTIM INJECTION 6 MG/0.6ML ~~LOC~~
PREFILLED_SYRINGE | SUBCUTANEOUS | Status: AC
Start: 2017-06-19 — End: 2017-06-19
  Filled 2017-06-19: qty 0.6

## 2017-06-19 MED ORDER — IOPAMIDOL (ISOVUE-300) INJECTION 61%
INTRAVENOUS | Status: AC
  Administered 2017-06-19: 100 mL via INTRAVENOUS
  Filled 2017-06-19: qty 100

## 2017-06-19 NOTE — Addendum Note (Signed)
Encounter addended by: Freeman Caldron, PA-C on: 06/19/2017 10:25 AM  Actions taken: Sign clinical note

## 2017-06-19 NOTE — Patient Instructions (Signed)
Pegfilgrastim injection What is this medicine? PEGFILGRASTIM (PEG fil gra stim) is a long-acting granulocyte colony-stimulating factor that stimulates the growth of neutrophils, a type of white blood cell important in the body's fight against infection. It is used to reduce the incidence of fever and infection in patients with certain types of cancer who are receiving chemotherapy that affects the bone marrow, and to increase survival after being exposed to high doses of radiation. This medicine may be used for other purposes; ask your health care provider or pharmacist if you have questions. COMMON BRAND NAME(S): Neulasta What should I tell my health care provider before I take this medicine? They need to know if you have any of these conditions: -kidney disease -latex allergy -ongoing radiation therapy -sickle cell disease -skin reactions to acrylic adhesives (On-Body Injector only) -an unusual or allergic reaction to pegfilgrastim, filgrastim, other medicines, foods, dyes, or preservatives -pregnant or trying to get pregnant -breast-feeding How should I use this medicine? This medicine is for injection under the skin. If you get this medicine at home, you will be taught how to prepare and give the pre-filled syringe or how to use the On-body Injector. Refer to the patient Instructions for Use for detailed instructions. Use exactly as directed. Tell your healthcare provider immediately if you suspect that the On-body Injector may not have performed as intended or if you suspect the use of the On-body Injector resulted in a missed or partial dose. It is important that you put your used needles and syringes in a special sharps container. Do not put them in a trash can. If you do not have a sharps container, call your pharmacist or healthcare provider to get one. Talk to your pediatrician regarding the use of this medicine in children. While this drug may be prescribed for selected conditions,  precautions do apply. Overdosage: If you think you have taken too much of this medicine contact a poison control center or emergency room at once. NOTE: This medicine is only for you. Do not share this medicine with others. What if I miss a dose? It is important not to miss your dose. Call your doctor or health care professional if you miss your dose. If you miss a dose due to an On-body Injector failure or leakage, a new dose should be administered as soon as possible using a single prefilled syringe for manual use. What may interact with this medicine? Interactions have not been studied. Give your health care provider a list of all the medicines, herbs, non-prescription drugs, or dietary supplements you use. Also tell them if you smoke, drink alcohol, or use illegal drugs. Some items may interact with your medicine. This list may not describe all possible interactions. Give your health care provider a list of all the medicines, herbs, non-prescription drugs, or dietary supplements you use. Also tell them if you smoke, drink alcohol, or use illegal drugs. Some items may interact with your medicine. What should I watch for while using this medicine? You may need blood work done while you are taking this medicine. If you are going to need a MRI, CT scan, or other procedure, tell your doctor that you are using this medicine (On-Body Injector only). What side effects may I notice from receiving this medicine? Side effects that you should report to your doctor or health care professional as soon as possible: -allergic reactions like skin rash, itching or hives, swelling of the face, lips, or tongue -dizziness -fever -pain, redness, or irritation at site   where injected -pinpoint red spots on the skin -red or dark-brown urine -shortness of breath or breathing problems -stomach or side pain, or pain at the shoulder -swelling -tiredness -trouble passing urine or change in the amount of urine Side  effects that usually do not require medical attention (report to your doctor or health care professional if they continue or are bothersome): -bone pain -muscle pain This list may not describe all possible side effects. Call your doctor for medical advice about side effects. You may report side effects to FDA at 1-800-FDA-1088. Where should I keep my medicine? Keep out of the reach of children. Store pre-filled syringes in a refrigerator between 2 and 8 degrees C (36 and 46 degrees F). Do not freeze. Keep in carton to protect from light. Throw away this medicine if it is left out of the refrigerator for more than 48 hours. Throw away any unused medicine after the expiration date. NOTE: This sheet is a summary. It may not cover all possible information. If you have questions about this medicine, talk to your doctor, pharmacist, or health care provider.  2018 Elsevier/Gold Standard (2016-06-19 12:58:03)  

## 2017-06-19 NOTE — Progress Notes (Signed)
  Radiation Oncology         [phone] ________________________________  Name: Jacqueline Kidd MRN: [id number]  Date: 05/06/2017  DOB: 1940/08/31   End of Treatment Note  Diagnosis:   76 y.o. female with probable stage III nsclc of the right upper lung with right neck nodal disease and 2 brain metastases    Indication for treatment:  Curative, Chemo-Radiotherapy       Radiation treatment dates:    1.  Chest radiation 03/23/2017 - 05/06/2017 2.  SRS on 04/03/17  Site/dose:    1.  The primary tumor and involved mediastinal adenopathy were treated to 66 Gy in 33 fractions of 2 Gy. 2.  [name] received stereotactic radiosurgery to the following targets:  Left cerebellar 12 mm target and Left insular 7 mm target were treated to a prescription dose of 20 Gy.  Beams/energy:    1.  A five field 3D conformal treatment arrangement was used delivering 6 and 10 MV photons.  Daily image-guidance CT was used to align the treatment with the targeted volume 2.  [name] received stereotactic radiosurgery to the Left cerebellar 12 mm target was treated using 3 Dynamic Conformal Arcs to a prescription dose of 20 Gy.  ExacTrac registration was performed for each couch angle.  The 80.3% isodose line was prescribed.  6 MV X-rays were delivered in the flattening filter free beam mode. Left insular 7 mm target was treated using 3 Dynamic Conformal Arcs to a prescription dose of 20 Gy.  ExacTrac registration was performed for each couch angle.  The 81.0% isodose line was prescribed.  6 MV X-rays were delivered in the flattening filter free beam mode.  Narrative: The patient tolerated radiation treatment relatively well.  The patient experienced some esophagitis characterized as 10/10 burning pain. She subsequently had difficulty/pain with swallowing and was mostly unable to swallow solid food without it feeling like it got stuck in her esophagus. She was given carafate and lidocaine for relief and  advised to try softer foods which was helpful. She also reported an occasional productive cough with bubbly phlegm that sometimes caused her to vomit. She denied any shortness of breath. The patient also noted significant fatigue. Hyperpigmentation with dry desquamation was noted within the treatment field. She reported using radiaplex, neosporin, and hydrocortisone on affected skin.   Plan: The patient has completed radiation treatment. The patient will return to radiation oncology clinic for routine followup in one month. I advised her to call or return sooner if she has any questions or concerns related to her recovery or treatment.  ________________________________  Sheral Apley. [name], M.D.  This document serves as a record of services personally performed by Tyler Pita, MD. It was created on his behalf by Rae Lips, a trained medical scribe. The creation of this record is based on the scribe's personal observations and the provider's statements to them. This document has been checked and approved by the attending provider.

## 2017-06-22 ENCOUNTER — Telehealth: Payer: Self-pay | Admitting: Medical Oncology

## 2017-06-22 ENCOUNTER — Other Ambulatory Visit: Payer: Self-pay | Admitting: Medical Oncology

## 2017-06-22 ENCOUNTER — Ambulatory Visit (HOSPITAL_BASED_OUTPATIENT_CLINIC_OR_DEPARTMENT_OTHER): Payer: Medicare Other | Admitting: Medical

## 2017-06-22 ENCOUNTER — Other Ambulatory Visit: Payer: Self-pay | Admitting: *Deleted

## 2017-06-22 ENCOUNTER — Other Ambulatory Visit (HOSPITAL_BASED_OUTPATIENT_CLINIC_OR_DEPARTMENT_OTHER): Payer: Medicare Other

## 2017-06-22 VITALS — BP 126/60 | HR 103 | Temp 98.0°F | Resp 18 | Ht 61.0 in | Wt 145.2 lb

## 2017-06-22 DIAGNOSIS — G629 Polyneuropathy, unspecified: Secondary | ICD-10-CM | POA: Diagnosis not present

## 2017-06-22 DIAGNOSIS — C778 Secondary and unspecified malignant neoplasm of lymph nodes of multiple regions: Secondary | ICD-10-CM | POA: Diagnosis not present

## 2017-06-22 DIAGNOSIS — C3412 Malignant neoplasm of upper lobe, left bronchus or lung: Secondary | ICD-10-CM

## 2017-06-22 DIAGNOSIS — C7931 Secondary malignant neoplasm of brain: Secondary | ICD-10-CM

## 2017-06-22 DIAGNOSIS — B3781 Candidal esophagitis: Principal | ICD-10-CM

## 2017-06-22 DIAGNOSIS — M84421A Pathological fracture, right humerus, initial encounter for fracture: Secondary | ICD-10-CM

## 2017-06-22 DIAGNOSIS — J069 Acute upper respiratory infection, unspecified: Secondary | ICD-10-CM

## 2017-06-22 DIAGNOSIS — K1379 Other lesions of oral mucosa: Secondary | ICD-10-CM

## 2017-06-22 DIAGNOSIS — G620 Drug-induced polyneuropathy: Secondary | ICD-10-CM

## 2017-06-22 DIAGNOSIS — B370 Candidal stomatitis: Secondary | ICD-10-CM

## 2017-06-22 LAB — CBC WITH DIFFERENTIAL/PLATELET
BASO%: 0.2 % (ref 0.0–2.0)
Basophils Absolute: 0 10*3/uL (ref 0.0–0.1)
EOS ABS: 0.1 10*3/uL (ref 0.0–0.5)
EOS%: 0.6 % (ref 0.0–7.0)
HCT: 26 % — ABNORMAL LOW (ref 34.8–46.6)
HGB: 8.3 g/dL — ABNORMAL LOW (ref 11.6–15.9)
LYMPH%: 7.4 % — AB (ref 14.0–49.7)
MCH: 34.3 pg — ABNORMAL HIGH (ref 25.1–34.0)
MCHC: 31.9 g/dL (ref 31.5–36.0)
MCV: 107.4 fL — AB (ref 79.5–101.0)
MONO#: 0.2 10*3/uL (ref 0.1–0.9)
MONO%: 1.9 % (ref 0.0–14.0)
NEUT%: 89.9 % — ABNORMAL HIGH (ref 38.4–76.8)
NEUTROS ABS: 9.7 10*3/uL — AB (ref 1.5–6.5)
Platelets: 69 10*3/uL — ABNORMAL LOW (ref 145–400)
RBC: 2.42 10*6/uL — AB (ref 3.70–5.45)
RDW: 20.9 % — ABNORMAL HIGH (ref 11.2–14.5)
WBC: 10.7 10*3/uL — AB (ref 3.9–10.3)
lymph#: 0.8 10*3/uL — ABNORMAL LOW (ref 0.9–3.3)

## 2017-06-22 LAB — COMPREHENSIVE METABOLIC PANEL
ALBUMIN: 3 g/dL — AB (ref 3.5–5.0)
ALK PHOS: 156 U/L — AB (ref 40–150)
ALT: 14 U/L (ref 0–55)
AST: 25 U/L (ref 5–34)
Anion Gap: 9 mEq/L (ref 3–11)
BUN: 8.2 mg/dL (ref 7.0–26.0)
CALCIUM: 8.8 mg/dL (ref 8.4–10.4)
CO2: 27 mEq/L (ref 22–29)
CREATININE: 0.6 mg/dL (ref 0.6–1.1)
Chloride: 103 mEq/L (ref 98–109)
EGFR: >60 mL/min/{1.73_m2} (ref >60)
Glucose: 99 mg/dl (ref 70–140)
Potassium: 4 mEq/L (ref 3.5–5.1)
Sodium: 138 mEq/L (ref 136–145)
Total Bilirubin: 1.17 mg/dL (ref 0.20–1.20)
Total Protein: 6.7 g/dL (ref 6.4–8.3)

## 2017-06-22 MED ORDER — FLUCONAZOLE 100 MG PO TABS: 100.0000 mg | ORAL_TABLET | Freq: Every day | ORAL | 0 refills | Status: DC

## 2017-06-22 NOTE — Telephone Encounter (Addendum)
Sore throat- "there is white stuff in the back of my throat".Denies pain. She did salt water rinse-not effective.. Per Mohamed appt with [facility]. Called pt and scheduled appt for lab and V Tanner at 230. "I will try to make it".

## 2017-06-22 NOTE — Progress Notes (Signed)
Pt fractured her right proximal humerus on 06/05/17. She was seen in the ED for this but has not had f/u with Orthopedist yet.  She states she called [facility] last week but had not heard back from them.  Telephone call to [facility]  [phone]). Spoke with [employer].  Misty states she did talk with pt last week and forwarded request for pt to be seen to one of the MD's there. Misty states she has not heard back from him yet.  Advised Misty that pt really needed to be seen. Misty is sending urgent request again and a note to herself to follow up.  TCT patient and advised her that she should be hearing from [facility] in the next day or so. Pt verbalized understanding.

## 2017-06-22 NOTE — Telephone Encounter (Signed)
Pt coming in today

## 2017-06-23 ENCOUNTER — Telehealth: Payer: Self-pay | Admitting: Medical

## 2017-06-23 ENCOUNTER — Other Ambulatory Visit: Payer: Self-pay | Admitting: Radiation Therapy

## 2017-06-23 DIAGNOSIS — C7949 Secondary malignant neoplasm of other parts of nervous system: Principal | ICD-10-CM

## 2017-06-23 DIAGNOSIS — C7931 Secondary malignant neoplasm of brain: Secondary | ICD-10-CM

## 2017-06-23 NOTE — Progress Notes (Signed)
[facility] Progress Note   Jacqueline Kidd [id number] 1940/12/31 76 y.o.   Jacqueline Kidd is managed by Eilleen Kempf  Actively treated with chemotherapy: yes  Current Therapy: Carboplatin and Taxol  Last Treated: 06/17/2017  Assessment: Plan:    Thrush of mouth and esophagus ([place]) - Plan: fluconazole (DIFLUCAN) 100 MG tablet  Drug-induced polyneuropathy ([place])   Thrush of the mouth and esophagus: The patient was given a prescription for Diflucan 100 mg p.o. daily times 10 days.  Drug-induced polyneuropathy: The patient was instructed to increase her gabapentin to 200 mg 3 times daily.  Please see After Visit Summary for patient specific instructions.  Future Appointments  Date Time Provider [place]  07/01/2017 11:30 AM [facility] LAB 4 [facility] None  07/08/2017  8:30 AM [facility] LAB 4 [facility] None  07/08/2017 10:00 AM [facility] B6 [facility] None  07/10/2017  1:00 PM [facility] INJ NURSE [facility] None  07/10/2017  2:00 PM [facility]315 MR 3 [facility]315MRI [facility]  07/15/2017 11:00 AM Bruning, Ashlyn, PA-C [facility] None  07/15/2017 11:30 AM [facility] LAB 2 [facility] None  07/22/2017 11:30 AM [facility] LAB 2 [facility] None    No orders of the defined types were placed in this encounter.     Subjective:   Patient ID:  Jacqueline Kidd is a 76 y.o. (DOB 04-06-1941) female.  Chief Complaint:  Chief Complaint  Patient presents with  . Sore Throat    HPI Jacqueline Kidd is a 76 year old female with a diagnosis of a stage IV (T2b, and 3, M1 C) lung cancer.  She has a history of a left upper lobe mass in addition to left hilar, mediastinal lymphadenopathy, and left true vocal cord paralysis.  She also has metastatic brain lesions for which she was treated with palliative radiotherapy.  She additionally received palliative radiotherapy to a large left upper lobe mass.  She is currently treated with systemic chemotherapy with carboplatin and  paclitaxel.  She most recently received cycle 4-day 1 of therapy on 06/17/2017.  She continues to have neuropathy in her fingertips despite using Neurontin 100 mg p.o. 3 times daily.  She is having soreness in her mouth and throat and finds a difficult to swallow over the past 2 days.  She is pending an appointment with orthopedic for her right arm fracture which she suffered on 06/05/2017.  Medications: I have reviewed the patient's current medications.  Allergies:  Allergies  Allergen Reactions  . Fosamax [Alendronate]   . Hydrocodone-Acetaminophen Nausea And Vomiting    EXTREME VOMITING   . Lisinopril-Hydrochlorothiazide Other (See Comments)    Chest spasms    Past Medical History:  Diagnosis Date  . Chronic low back pain   . COPD (chronic obstructive pulmonary disease) ([place])   . GERD (gastroesophageal reflux disease)   . Hyperlipidemia   . Hypertension   . Lesion of left lung   . Osteoporosis     Past Surgical History:  Procedure Laterality Date  . ABDOMINAL HYSTERECTOMY    . TOTAL HIP ARTHROPLASTY     right    Family History  Problem Relation Age of Onset  . Cancer Mother        breast  . Cancer Father        esophageal  . Cancer Sister        breast    Social History   Socioeconomic History  . Marital status: Married    Spouse name: Not on file  . Number  of children: Not on file  . Years of education: Not on file  . Highest education level: Not on file  Social Needs  . Financial resource strain: Not on file  . Food insecurity - worry: Not on file  . Food insecurity - inability: Not on file  . Transportation needs - medical: Not on file  . Transportation needs - non-medical: Not on file  Occupational History  . Not on file  Tobacco Use  . Smoking status: Former Smoker    Packs/day: 1.00    Years: 57.00    Pack years: 57.00    Last attempt to quit: 02/16/2017    Years since quitting: 0.3  . Smokeless tobacco: Never Used  Substance and Sexual  Activity  . Alcohol use: No  . Drug use: No  . Sexual activity: No  Other Topics Concern  . Not on file  Social History Narrative  . Not on file    Past Medical History, Surgical history, Social history, and Family history were reviewed and updated as appropriate.   Please see review of systems for further details on the patient's review from today.   Review of Systems:  Review of Systems  Constitutional: Positive for appetite change. Negative for chills, diaphoresis and fever.  HENT: Positive for sore throat and trouble swallowing.   Respiratory: Negative for cough and shortness of breath.   Cardiovascular: Negative for chest pain.    Objective:   Physical Exam:  BP 126/60 (BP Location: Left Arm, Patient Position: Sitting)   Pulse (!) 103   Temp 98 F (36.7 C) (Oral)   Resp 18   Ht 5\' 1"  (1.549 m)   Wt 145 lb 3.2 oz (65.9 kg)   SpO2 98%   BMI 27.44 kg/m  ECOG: 1  Physical Exam  Constitutional: No distress.  HENT:  Head: Normocephalic and atraumatic.  Mouth/Throat:    Eyes: Right eye exhibits no discharge. Left eye exhibits no discharge. No scleral icterus.  Neck: Normal range of motion. Neck supple.  Cardiovascular: Normal rate, regular rhythm and normal heart sounds. Exam reveals no gallop and no friction rub.  No murmur heard. Pulmonary/Chest: Effort normal and breath sounds normal. No respiratory distress. She has no wheezes. She has no rales.  Musculoskeletal:  The patient has her right arm in a sling.  Lymphadenopathy:    She has no cervical adenopathy.  Neurological: She is alert. Coordination normal.  Skin: Skin is warm and dry. She is not diaphoretic.    Lab Review:     Component Value Date/Time   NA 138 06/22/2017 1444   K 4.0 06/22/2017 1444   CL 101 07/04/2016 1211   CO2 27 06/22/2017 1444   GLUCOSE 99 06/22/2017 1444   BUN 8.2 06/22/2017 1444   CREATININE 0.6 06/22/2017 1444   CALCIUM 8.8 06/22/2017 1444   PROT 6.7 06/22/2017 1444    ALBUMIN 3.0 (L) 06/22/2017 1444   AST 25 06/22/2017 1444   ALT 14 06/22/2017 1444   ALKPHOS 156 (H) 06/22/2017 1444   BILITOT 1.17 06/22/2017 1444   GFRNONAA 78 07/04/2016 1211   GFRNONAA 76 01/21/2013 1526   GFRAA 90 07/04/2016 1211   GFRAA 88 01/21/2013 1526       Component Value Date/Time   WBC 10.7 (H) 06/22/2017 1444   WBC 8.5 03/18/2017 1007   RBC 2.42 (L) 06/22/2017 1444   RBC 4.46 03/18/2017 1007   HGB 8.3 (L) 06/22/2017 1444   HCT 26.0 (L) 06/22/2017 1444  PLT 69 (L) 06/22/2017 1444   PLT 242 07/04/2016 1211   MCV 107.4 (H) 06/22/2017 1444   MCH 34.3 (H) 06/22/2017 1444   MCH 31.2 03/18/2017 1007   MCHC 31.9 06/22/2017 1444   MCHC 32.9 03/18/2017 1007   RDW 20.9 (H) 06/22/2017 1444   LYMPHSABS 0.8 (L) 06/22/2017 1444   MONOABS 0.2 06/22/2017 1444   EOSABS 0.1 06/22/2017 1444   EOSABS 0.1 07/04/2016 1211   BASOSABS 0.0 06/22/2017 1444   -------------------------------  Imaging from last 24 hours (if applicable):  Radiology interpretation: Ct Chest W Contrast  Result Date: 06/19/2017 CLINICAL DATA:  Lung cancer diagnosis July 2018. Ongoing chemotherapy radiation therapy. RIGHT arm fracture. EXAM: CT CHEST, ABDOMEN, AND PELVIS WITH CONTRAST TECHNIQUE: Multidetector CT imaging of the chest, abdomen and pelvis was performed following the standard protocol during bolus administration of intravenous contrast. CONTRAST:  127mL ISOVUE-300 IOPAMIDOL (ISOVUE-300) INJECTION 61% COMPARISON:  PET-CT 2020/07/1416, CT chest 03/18/2017 FINDINGS: CT CHEST FINDINGS Cardiovascular: Coronary artery calcification and aortic atherosclerotic calcification. Apparent course of the Mediastinum/Nodes: No axillary or supraclavicular adenopathy. Some haziness in the AP window at site of prior nodal mass. No measurable nodal mass remains. No pericardial fluid Lungs/Pleura: Interval decrease in size of LEFT upper lobe mass measuring 3.0 by 2.9 cm decreased from 5.1 x 4.2 cm. No new pulmonary  nodules. Musculoskeletal: No aggressive osseous lesion. CT ABDOMEN AND PELVIS FINDINGS Hepatobiliary: No focal hepatic lesion. No biliary ductal dilatation. Gallbladder is normal. Common bile duct is normal. Pancreas: Pancreas is normal. No ductal dilatation. No pancreatic inflammation. Spleen: Normal spleen Adrenals/urinary tract: Adrenal glands and kidneys are normal. The ureters and bladder normal. Stomach/Bowel: Stomach, small bowel, appendix, and cecum are normal. The colon and rectosigmoid colon are normal. Vascular/Lymphatic: Abdominal aorta is normal caliber with atherosclerotic calcification. There is no retroperitoneal or periportal lymphadenopathy. No pelvic lymphadenopathy. Reproductive: Post hysterectomy Other: No peritoneal metastasis Musculoskeletal: No skeletal metastasis identified IMPRESSION: Chest Impression: 1. Reduction in volume of the LEFT upper lobe mass. 2. Reduction in volume of mediastinal lymphadenopathy with ill-defined tissue in AP window at site of previous nodal mass. 3. No new or progressive disease in the thorax. Abdomen / Pelvis Impression: 1. No evidence of metastatic disease in the abdomen pelvis. 2. No skeletal metastasis identified. Electronically Signed   By: Suzy Bouchard M.D.   On: 06/19/2017 17:22   Ct Abdomen Pelvis W Contrast  Result Date: 06/19/2017 CLINICAL DATA:  Lung cancer diagnosis July 2018. Ongoing chemotherapy radiation therapy. RIGHT arm fracture. EXAM: CT CHEST, ABDOMEN, AND PELVIS WITH CONTRAST TECHNIQUE: Multidetector CT imaging of the chest, abdomen and pelvis was performed following the standard protocol during bolus administration of intravenous contrast. CONTRAST:  183mL ISOVUE-300 IOPAMIDOL (ISOVUE-300) INJECTION 61% COMPARISON:  PET-CT 2020/07/1416, CT chest 03/18/2017 FINDINGS: CT CHEST FINDINGS Cardiovascular: Coronary artery calcification and aortic atherosclerotic calcification. Apparent course of the Mediastinum/Nodes: No axillary or  supraclavicular adenopathy. Some haziness in the AP window at site of prior nodal mass. No measurable nodal mass remains. No pericardial fluid Lungs/Pleura: Interval decrease in size of LEFT upper lobe mass measuring 3.0 by 2.9 cm decreased from 5.1 x 4.2 cm. No new pulmonary nodules. Musculoskeletal: No aggressive osseous lesion. CT ABDOMEN AND PELVIS FINDINGS Hepatobiliary: No focal hepatic lesion. No biliary ductal dilatation. Gallbladder is normal. Common bile duct is normal. Pancreas: Pancreas is normal. No ductal dilatation. No pancreatic inflammation. Spleen: Normal spleen Adrenals/urinary tract: Adrenal glands and kidneys are normal. The ureters and bladder normal. Stomach/Bowel: Stomach, small  bowel, appendix, and cecum are normal. The colon and rectosigmoid colon are normal. Vascular/Lymphatic: Abdominal aorta is normal caliber with atherosclerotic calcification. There is no retroperitoneal or periportal lymphadenopathy. No pelvic lymphadenopathy. Reproductive: Post hysterectomy Other: No peritoneal metastasis Musculoskeletal: No skeletal metastasis identified IMPRESSION: Chest Impression: 1. Reduction in volume of the LEFT upper lobe mass. 2. Reduction in volume of mediastinal lymphadenopathy with ill-defined tissue in AP window at site of previous nodal mass. 3. No new or progressive disease in the thorax. Abdomen / Pelvis Impression: 1. No evidence of metastatic disease in the abdomen pelvis. 2. No skeletal metastasis identified. Electronically Signed   By: Suzy Bouchard M.D.   On: 06/19/2017 17:22   Dg Humerus Right  Result Date: 06/05/2017 CLINICAL DATA:  Right upper arm pain after fall today. EXAM: RIGHT HUMERUS - 2+ VIEW COMPARISON:  None. FINDINGS: Moderately displaced fracture is seen involving the greater tuberosity of proximal right humerus. Possible nondisplaced humeral neck fracture cannot be excluded. No dislocation is noted. IMPRESSION: Moderately displaced fracture involving greater  tuberosity of proximal right humerus. Possible nondisplaced right humeral neck fracture cannot be excluded; CT scan is recommended for further evaluation. Electronically Signed   By: Marijo Conception, M.D.   On: 06/05/2017 15:25   Dg Hip Unilat With Pelvis 2-3 Views Right  Result Date: 06/05/2017 CLINICAL DATA:  76 y/o  F; fall with right hip pain. EXAM: DG HIP (WITH OR WITHOUT PELVIS) 2-3V RIGHT COMPARISON:  None. FINDINGS: Right total hip prosthesis, no periprosthetic lucency or fracture identified. Mild left hip osteoarthrosis with periarticular osteophytes. No pelvic fracture or diastases identified. IMPRESSION: Right total hip prosthesis. No acute fracture or dislocation identified. Electronically Signed   By: Kristine Garbe M.D.   On: 06/05/2017 21:04

## 2017-06-23 NOTE — Telephone Encounter (Signed)
No 12/17 los °

## 2017-06-24 ENCOUNTER — Other Ambulatory Visit: Payer: Medicare Other

## 2017-07-01 ENCOUNTER — Telehealth: Payer: Self-pay | Admitting: Medical Oncology

## 2017-07-01 ENCOUNTER — Ambulatory Visit (HOSPITAL_COMMUNITY)
Admission: RE | Admit: 2017-07-01 | Discharge: 2017-07-01 | Disposition: A | Discharge status: Home / Self Care | Payer: Medicare Other | Source: Ambulatory Visit | Attending: Internal Medicine | Admitting: Internal Medicine

## 2017-07-01 ENCOUNTER — Ambulatory Visit (HOSPITAL_BASED_OUTPATIENT_CLINIC_OR_DEPARTMENT_OTHER): Payer: Medicare Other

## 2017-07-01 ENCOUNTER — Other Ambulatory Visit: Payer: Self-pay | Admitting: Medical Oncology

## 2017-07-01 ENCOUNTER — Other Ambulatory Visit (HOSPITAL_BASED_OUTPATIENT_CLINIC_OR_DEPARTMENT_OTHER): Payer: Medicare Other

## 2017-07-01 DIAGNOSIS — C3412 Malignant neoplasm of upper lobe, left bronchus or lung: Secondary | ICD-10-CM

## 2017-07-01 DIAGNOSIS — C778 Secondary and unspecified malignant neoplasm of lymph nodes of multiple regions: Secondary | ICD-10-CM | POA: Diagnosis not present

## 2017-07-01 DIAGNOSIS — Z006 Encounter for examination for normal comparison and control in clinical research program: Secondary | ICD-10-CM

## 2017-07-01 DIAGNOSIS — D649 Anemia, unspecified: Secondary | ICD-10-CM

## 2017-07-01 DIAGNOSIS — C7931 Secondary malignant neoplasm of brain: Secondary | ICD-10-CM | POA: Diagnosis not present

## 2017-07-01 LAB — CBC WITH DIFFERENTIAL/PLATELET
BASO%: 0.3 % (ref 0.0–2.0)
Basophils Absolute: 0 10e3/uL (ref 0.0–0.1)
EOS%: 0.5 % (ref 0.0–7.0)
Eosinophils Absolute: 0 10e3/uL (ref 0.0–0.5)
HCT: 25.3 % — ABNORMAL LOW (ref 34.8–46.6)
HGB: 8.3 g/dL — ABNORMAL LOW (ref 11.6–15.9)
LYMPH%: 14.9 % (ref 14.0–49.7)
MCH: 34.8 pg — ABNORMAL HIGH (ref 25.1–34.0)
MCHC: 32.8 g/dL (ref 31.5–36.0)
MCV: 106.3 fL — ABNORMAL HIGH (ref 79.5–101.0)
MONO#: 0.5 10e3/uL (ref 0.1–0.9)
MONO%: 8.2 % (ref 0.0–14.0)
NEUT#: 4.9 10e3/uL (ref 1.5–6.5)
NEUT%: 76.1 % (ref 38.4–76.8)
Platelets: 86 10e3/uL — ABNORMAL LOW (ref 145–400)
RBC: 2.38 10e6/uL — ABNORMAL LOW (ref 3.70–5.45)
RDW: 22.4 % — ABNORMAL HIGH (ref 11.2–14.5)
WBC: 6.4 10e3/uL (ref 3.9–10.3)
lymph#: 1 10e3/uL (ref 0.9–3.3)

## 2017-07-01 LAB — COMPREHENSIVE METABOLIC PANEL
ALBUMIN: 3 g/dL — AB (ref 3.5–5.0)
ALK PHOS: 135 U/L (ref 40–150)
ALT: 8 U/L (ref 0–55)
ANION GAP: 8 meq/L (ref 3–11)
AST: 17 U/L (ref 5–34)
BILIRUBIN TOTAL: 0.67 mg/dL (ref 0.20–1.20)
BUN: 6 mg/dL — ABNORMAL LOW (ref 7.0–26.0)
CALCIUM: 8.8 mg/dL (ref 8.4–10.4)
CO2: 25 meq/L (ref 22–29)
CREATININE: 0.6 mg/dL (ref 0.6–1.1)
Chloride: 104 mEq/L (ref 98–109)
Glucose: 97 mg/dl (ref 70–140)
Potassium: 3.9 mEq/L (ref 3.5–5.1)
Sodium: 138 mEq/L (ref 136–145)
TOTAL PROTEIN: 7 g/dL (ref 6.4–8.3)

## 2017-07-01 LAB — PREPARE RBC (CROSSMATCH)

## 2017-07-01 LAB — RESEARCH LABS

## 2017-07-01 MED ORDER — DIPHENHYDRAMINE HCL 25 MG PO CAPS
ORAL_CAPSULE | ORAL | Status: AC
  Filled 2017-07-01: qty 1

## 2017-07-01 MED ORDER — SODIUM CHLORIDE 0.9 % IV SOLN
250.0000 mL | Freq: Once | INTRAVENOUS | Status: AC
  Administered 2017-07-01: 250 mL via INTRAVENOUS

## 2017-07-01 MED ORDER — DIPHENHYDRAMINE HCL 25 MG PO CAPS
25.0000 mg | ORAL_CAPSULE | Freq: Once | ORAL | Status: AC
  Administered 2017-07-01: 25 mg via ORAL

## 2017-07-01 MED ORDER — ACETAMINOPHEN 325 MG PO TABS
650.0000 mg | ORAL_TABLET | Freq: Once | ORAL | Status: AC
  Administered 2017-07-01: 650 mg via ORAL

## 2017-07-01 MED ORDER — ACETAMINOPHEN 325 MG PO TABS
ORAL_TABLET | ORAL | Status: AC
  Filled 2017-07-01: qty 2

## 2017-07-01 NOTE — Telephone Encounter (Signed)
Feels very weak and tired when ambulating. HGB 8.3. Per Julien Nordmann -transfuse one unit of blood today.

## 2017-07-01 NOTE — Patient Instructions (Addendum)
Blood Transfusion, Adult A blood transfusion is a procedure in which you receive donated blood, including plasma, platelets, and red blood cells, through an IV tube. You may need a blood transfusion because of illness, surgery, or injury. The blood may come from a donor. You may also be able to donate blood for yourself (autologous blood donation) before a surgery if you know that you might require a blood transfusion. The blood given in a transfusion is made up of different types of cells. You may receive:  Red blood cells. These carry oxygen to the cells in the body.  White blood cells. These help you fight infections.  Platelets. These help your blood to clot.  Plasma. This is the liquid part of your blood and it helps with fluid imbalances.  If you have hemophilia or another clotting disorder, you may also receive other types of blood products. Tell a health care provider about:  Any allergies you have.  All medicines you are taking, including vitamins, herbs, eye drops, creams, and over-the-counter medicines.  Any problems you or family members have had with anesthetic medicines.  Any blood disorders you have.  Any surgeries you have had.  Any medical conditions you have, including any recent fever or cold symptoms.  Whether you are pregnant or may be pregnant.  Any previous reactions you have had during a blood transfusion. What are the risks? Generally, this is a safe procedure. However, problems may occur, including:  Having an allergic reaction to something in the donated blood. Hives and itching may be symptoms of this type of reaction.  Fever. This may be a reaction to the white blood cells in the transfused blood. Nausea or chest pain may accompany a fever.  Iron overload. This can happen from having many transfusions.  Transfusion-related acute lung injury (TRALI). This is a rare reaction that causes lung damage. The cause is not known.TRALI can occur within hours  of a transfusion or several days later.  Sudden (acute) or delayed hemolytic reactions. This happens if your blood does not match the cells in your transfusion. Your body's defense system (immune system) may try to attack the new cells. This complication is rare. The symptoms include fever, chills, nausea, and low back pain or chest pain.  Infection or disease transmission. This is rare.  What happens before the procedure?  You will have a blood test to determine your blood type. This is necessary to know what kind of blood your body will accept and to match it to the donor blood.  If you are going to have a planned surgery, you may be able to do an autologous blood donation. This may be done in case you need to have a transfusion.  If you have had an allergic reaction to a transfusion in the past, you may be given medicine to help prevent a reaction. This medicine may be given to you by mouth or through an IV tube.  You will have your temperature, blood pressure, and pulse monitored before the transfusion.  Follow instructions from your health care provider about eating and drinking restrictions.  Ask your health care provider about: ? Changing or stopping your regular medicines. This is especially important if you are taking diabetes medicines or blood thinners. ? Taking medicines such as aspirin and ibuprofen. These medicines can thin your blood. Do not take these medicines before your procedure if your health care provider instructs you not to. What happens during the procedure?  An IV tube will   be inserted into one of your veins.  The bag of donated blood will be attached to your IV tube. The blood will then enter through your vein.  Your temperature, blood pressure, and pulse will be monitored regularly during the transfusion. This monitoring is done to detect early signs of a transfusion reaction.  If you have any signs or symptoms of a reaction, your transfusion will be stopped  and you may be given medicine.  When the transfusion is complete, your IV tube will be removed.  Pressure may be applied to the IV site for a few minutes.  A bandage (dressing) will be applied. The procedure may vary among health care providers and hospitals. What happens after the procedure?  Your temperature, blood pressure, heart rate, breathing rate, and blood oxygen level will be monitored often.  Your blood may be tested to see how you are responding to the transfusion.  You may be warmed with fluids or blankets to maintain a normal body temperature. Summary  A blood transfusion is a procedure in which you receive donated blood, including plasma, platelets, and red blood cells, through an IV tube.  Your temperature, blood pressure, and pulse will be monitored before, during, and after the transfusion.  Your blood may be tested after the transfusion to see how your body has responded. This information is not intended to replace advice given to you by your health care provider. Make sure you discuss any questions you have with your health care provider. Document Released: 06/20/2000 Document Revised: 03/20/2016 Document Reviewed: 03/20/2016 Elsevier Interactive Patient Education  2018 [employer].  Blood Transfusion, Care After This sheet gives you information about how to care for yourself after your procedure. Your doctor may also give you more specific instructions. If you have problems or questions, contact your doctor. Follow these instructions at home:  Take over-the-counter and prescription medicines only as told by your doctor.  Go back to your normal activities as told by your doctor.  Follow instructions from your doctor about how to take care of the area where an IV tube was put into your vein (insertion site). Make sure you: ? Wash your hands with soap and water before you change your bandage (dressing). If there is no soap and water, use hand sanitizer. ? Change  your bandage as told by your doctor.  Check your IV insertion site every day for signs of infection. Check for: ? More redness, swelling, or pain. ? More fluid or blood. ? Warmth. ? Pus or a bad smell. Contact a doctor if:  You have more redness, swelling, or pain around the IV insertion site..  You have more fluid or blood coming from the IV insertion site.  Your IV insertion site feels warm to the touch.  You have pus or a bad smell coming from the IV insertion site.  Your pee (urine) turns pink, red, or brown.  You feel weak after doing your normal activities. Get help right away if:  You have signs of a serious allergic or body defense (immune) system reaction, including: ? Itchiness. ? Hives. ? Trouble breathing. ? Anxiety. ? Pain in your chest or lower back. ? Fever, flushing, and chills. ? Fast pulse. ? Rash. ? Watery poop (diarrhea). ? Throwing up (vomiting). ? Dark pee. ? Serious headache. ? Dizziness. ? Stiff neck. ? Yellow color in your face or the white parts of your eyes (jaundice). Summary  After a blood transfusion, return to your normal activities as told  by your doctor.  Every day, check for signs of infection where the IV tube was put into your vein.  Some signs of infection are warm skin, more redness and pain, more fluid or blood, and pus or a bad smell where the needle went in.  Contact your doctor if you feel weak or have any unusual symptoms. This information is not intended to replace advice given to you by your health care provider. Make sure you discuss any questions you have with your health care provider. Document Released: 07/14/2014 Document Revised: 02/15/2016 Document Reviewed: 02/15/2016 Elsevier Interactive Patient Education  2017 [employer].

## 2017-07-02 LAB — TYPE AND SCREEN
ABO/RH(D): A POS
ANTIBODY SCREEN: NEGATIVE
Unit division: 0

## 2017-07-02 LAB — BPAM RBC
Blood Product Expiration Date: 201901222359
ISSUE DATE / TIME: 201812261507
Unit Type and Rh: 6200

## 2017-07-02 LAB — ABO/RH: ABO/RH(D): A POS

## 2017-07-08 ENCOUNTER — Other Ambulatory Visit: Payer: Self-pay | Admitting: Internal Medicine

## 2017-07-08 ENCOUNTER — Other Ambulatory Visit (HOSPITAL_BASED_OUTPATIENT_CLINIC_OR_DEPARTMENT_OTHER): Payer: Medicare Other

## 2017-07-08 ENCOUNTER — Ambulatory Visit (HOSPITAL_BASED_OUTPATIENT_CLINIC_OR_DEPARTMENT_OTHER): Payer: Medicare Other

## 2017-07-08 ENCOUNTER — Encounter: Payer: Self-pay | Admitting: Internal Medicine

## 2017-07-08 ENCOUNTER — Inpatient Hospital Stay: Payer: Medicare Other | Attending: Internal Medicine | Admitting: Internal Medicine

## 2017-07-08 VITALS — BP 123/66 | HR 96 | Temp 98.1°F | Resp 17 | Ht 61.0 in | Wt 142.5 lb

## 2017-07-08 DIAGNOSIS — C7931 Secondary malignant neoplasm of brain: Secondary | ICD-10-CM | POA: Insufficient documentation

## 2017-07-08 DIAGNOSIS — R05 Cough: Secondary | ICD-10-CM | POA: Insufficient documentation

## 2017-07-08 DIAGNOSIS — C778 Secondary and unspecified malignant neoplasm of lymph nodes of multiple regions: Secondary | ICD-10-CM

## 2017-07-08 DIAGNOSIS — Z5111 Encounter for antineoplastic chemotherapy: Secondary | ICD-10-CM

## 2017-07-08 DIAGNOSIS — R0609 Other forms of dyspnea: Secondary | ICD-10-CM | POA: Diagnosis not present

## 2017-07-08 DIAGNOSIS — C3412 Malignant neoplasm of upper lobe, left bronchus or lung: Secondary | ICD-10-CM | POA: Diagnosis not present

## 2017-07-08 DIAGNOSIS — G620 Drug-induced polyneuropathy: Secondary | ICD-10-CM | POA: Insufficient documentation

## 2017-07-08 DIAGNOSIS — Z5189 Encounter for other specified aftercare: Secondary | ICD-10-CM | POA: Insufficient documentation

## 2017-07-08 DIAGNOSIS — C3492 Malignant neoplasm of unspecified part of left bronchus or lung: Secondary | ICD-10-CM

## 2017-07-08 DIAGNOSIS — R5383 Other fatigue: Secondary | ICD-10-CM | POA: Diagnosis not present

## 2017-07-08 DIAGNOSIS — I10 Essential (primary) hypertension: Secondary | ICD-10-CM

## 2017-07-08 LAB — COMPREHENSIVE METABOLIC PANEL
ALT: 16 U/L (ref 0–55)
AST: 25 U/L (ref 5–34)
Albumin: 2.9 g/dL — ABNORMAL LOW (ref 3.5–5.0)
Alkaline Phosphatase: 119 U/L (ref 40–150)
Anion Gap: 8 mEq/L (ref 3–11)
BILIRUBIN TOTAL: 0.52 mg/dL (ref 0.20–1.20)
BUN: 4.5 mg/dL — ABNORMAL LOW (ref 7.0–26.0)
CHLORIDE: 105 meq/L (ref 98–109)
CO2: 24 meq/L (ref 22–29)
CREATININE: 0.6 mg/dL (ref 0.6–1.1)
Calcium: 9 mg/dL (ref 8.4–10.4)
EGFR: >60 mL/min/{1.73_m2} (ref >60)
GLUCOSE: 127 mg/dL (ref 70–140)
Potassium: 3.3 mEq/L — ABNORMAL LOW (ref 3.5–5.1)
Sodium: 138 mEq/L (ref 136–145)
TOTAL PROTEIN: 7 g/dL (ref 6.4–8.3)

## 2017-07-08 LAB — CBC WITH DIFFERENTIAL/PLATELET
BASO%: 0.6 % (ref 0.0–2.0)
BASOS ABS: 0 10*3/uL (ref 0.0–0.1)
EOS ABS: 0 10*3/uL (ref 0.0–0.5)
EOS%: 0.8 % (ref 0.0–7.0)
HCT: 32.1 % — ABNORMAL LOW (ref 34.8–46.6)
HEMOGLOBIN: 10.2 g/dL — AB (ref 11.6–15.9)
LYMPH%: 24.7 % (ref 14.0–49.7)
MCH: 33 pg (ref 25.1–34.0)
MCHC: 31.8 g/dL (ref 31.5–36.0)
MCV: 103.9 fL — AB (ref 79.5–101.0)
MONO#: 0.5 10*3/uL (ref 0.1–0.9)
MONO%: 14.4 % — AB (ref 0.0–14.0)
NEUT#: 2.1 10*3/uL (ref 1.5–6.5)
NEUT%: 59.5 % (ref 38.4–76.8)
Platelets: 91 10*3/uL — ABNORMAL LOW (ref 145–400)
RBC: 3.09 10*6/uL — ABNORMAL LOW (ref 3.70–5.45)
RDW: 22.8 % — ABNORMAL HIGH (ref 11.2–14.5)
WBC: 3.6 10*3/uL — ABNORMAL LOW (ref 3.9–10.3)
lymph#: 0.9 10*3/uL (ref 0.9–3.3)

## 2017-07-08 MED ORDER — DIPHENHYDRAMINE HCL 50 MG/ML IJ SOLN
INTRAMUSCULAR | Status: AC
  Filled 2017-07-08: qty 1

## 2017-07-08 MED ORDER — DIPHENHYDRAMINE HCL 50 MG/ML IJ SOLN
50.0000 mg | Freq: Once | INTRAMUSCULAR | Status: AC
  Administered 2017-07-08: 50 mg via INTRAVENOUS

## 2017-07-08 MED ORDER — PALONOSETRON HCL INJECTION 0.25 MG/5ML
0.2500 mg | Freq: Once | INTRAVENOUS | Status: AC
  Administered 2017-07-08: 0.25 mg via INTRAVENOUS

## 2017-07-08 MED ORDER — PALONOSETRON HCL INJECTION 0.25 MG/5ML
INTRAVENOUS | Status: AC
Start: 2017-07-08 — End: 2017-07-08
  Filled 2017-07-08: qty 5

## 2017-07-08 MED ORDER — SODIUM CHLORIDE 0.9 % IV SOLN
Freq: Once | INTRAVENOUS | Status: AC
  Administered 2017-07-08: 10:00:00 via INTRAVENOUS

## 2017-07-08 MED ORDER — FAMOTIDINE IN NACL 20-0.9 MG/50ML-% IV SOLN
INTRAVENOUS | Status: AC
Start: 2017-07-08 — End: 2017-07-08
  Filled 2017-07-08: qty 50

## 2017-07-08 MED ORDER — SODIUM CHLORIDE 0.9 % IV SOLN
20.0000 mg | Freq: Once | INTRAVENOUS | Status: AC
  Administered 2017-07-08: 20 mg via INTRAVENOUS
  Filled 2017-07-08: qty 2

## 2017-07-08 MED ORDER — SODIUM CHLORIDE 0.9 % IV SOLN
370.0000 mg | Freq: Once | INTRAVENOUS | Status: AC
  Administered 2017-07-08: 370 mg via INTRAVENOUS
  Filled 2017-07-08: qty 37

## 2017-07-08 MED ORDER — SODIUM CHLORIDE 0.9 % IV SOLN
150.0000 mg/m2 | Freq: Once | INTRAVENOUS | Status: AC
  Administered 2017-07-08: 270 mg via INTRAVENOUS
  Filled 2017-07-08: qty 45

## 2017-07-08 MED ORDER — FAMOTIDINE IN NACL 20-0.9 MG/50ML-% IV SOLN
20.0000 mg | Freq: Once | INTRAVENOUS | Status: AC
  Administered 2017-07-08: 20 mg via INTRAVENOUS

## 2017-07-08 NOTE — Progress Notes (Signed)
Per Dr. Julien Nordmann, [place] to treat with platelets of 91

## 2017-07-08 NOTE — Progress Notes (Signed)
[facility] [phone]   Fax:([phone]) [place] [address]  DIAGNOSIS: stage IV(T2b, N3, M1c)  lung cancer pending further staging workup and tissue diagnosis. The patient presented with large left upper lobe lung mass in addition to left hilar and mediastinal lymphadenopathy and left true vocal cord paralysis.  PRIOR THERAPY: Palliative radiotherapy to the large left upper lobe lung mass as well as metastatic brain lesions.  CURRENT THERAPY:  1) systemic chemotherapy with carboplatin for AUC of 5 and paclitaxel 175 MG/M2 every 3 weeks. First dose on 04/15/2017.  Status post 4 cycles.  INTERVAL HISTORY: Jacqueline Kidd 77 y.o. female returns to the clinic today for follow-up visit accompanied by her son.  The patient is feeling fine today with no specific complaints except for mild fatigue and mild peripheral neuropathy.  She denied having any chest pain but has shortness of breath with exertion with no cough or hemoptysis.  She denied having any fever or chills.  She has no nausea, vomiting, diarrhea or constipation.  The patient has no significant weight loss or night sweats.  She had repeat CT scan of the chest, abdomen and pelvis performed recently and she is here for evaluation and discussion of her scan results before starting cycle #5.   MEDICAL HISTORY: Past Medical History:  Diagnosis Date  . Chronic low back pain   . COPD (chronic obstructive pulmonary disease) ([place])   . GERD (gastroesophageal reflux disease)   . Hyperlipidemia   . Hypertension   . Lesion of left lung   . Osteoporosis     ALLERGIES:  is allergic to fosamax [alendronate]; hydrocodone-acetaminophen; and lisinopril-hydrochlorothiazide.  MEDICATIONS:  Current Outpatient Medications  Medication Sig Dispense Refill  . acetaminophen (TYLENOL) 325 MG tablet Take 650 mg by mouth every 6 (six) hours as needed for mild  pain or moderate pain.     Marland Kitchen amLODipine (NORVASC) 2.5 MG tablet Take 1 tablet (2.5 mg total) by mouth daily. (Patient not taking: Reported on 06/05/2017) 90 tablet 1  . aspirin EC 81 MG tablet Take 81 mg by mouth daily.    . cholecalciferol (VITAMIN D) 1000 units tablet Take 1,000 Units by mouth daily.    . fluconazole (DIFLUCAN) 100 MG tablet Take 1 tablet (100 mg total) by mouth daily. 10 tablet 0  . gabapentin (NEURONTIN) 100 MG capsule Take 1 capsule (100 mg total) by mouth 3 (three) times daily. 90 capsule 0  . lidocaine (XYLOCAINE) 2 % solution Use as directed 5 mLs in the mouth or throat as needed for mouth pain. Mix with 5 mL water in 10 mL syringe and instill past tongue (Patient not taking: Reported on 05/27/2017) 200 mL 5  . LORazepam (ATIVAN) 0.5 MG tablet Take 1 tablet (0.5 mg total) by mouth as needed for anxiety (30 min prior to radiotherapy treatment; may repeat after 30 min if needed). (Patient not taking: Reported on 06/22/2017) 2 tablet 0  . Omega-3 Fatty Acids (FISH OIL) 1000 MG CAPS Take 1 capsule by mouth daily.    Marland Kitchen oxyCODONE-acetaminophen (PERCOCET/ROXICET) 5-325 MG tablet Take 1-2 tablets by mouth every 4 (four) hours as needed for severe pain. (Patient not taking: Reported on 06/22/2017) 30 tablet 0  . prochlorperazine (COMPAZINE) 10 MG tablet Take 1 tablet (10 mg total) by mouth every 6 (six) hours as needed for nausea or vomiting. (Patient not taking: Reported on 06/22/2017)  30 tablet 1  . sucralfate (CARAFATE) 1 g tablet Take 1 tablet (1 g total) by mouth 4 (four) times daily -  with meals and at bedtime. 5 min before meals for radiation induced esophagitis (Patient not taking: Reported on 05/27/2017) 120 tablet 2  . traMADol (ULTRAM) 50 MG tablet Take 1 tablet (50 mg total) by mouth every 8 (eight) hours as needed. (Patient not taking: Reported on 06/05/2017) 15 tablet 0   No current facility-administered medications for this visit.    Facility-Administered Medications  Ordered in Other Visits  Medication Dose Route Frequency Provider Last Rate Last Dose  . CARBOplatin (PARAPLATIN) 370 mg in sodium chloride 0.9 % 250 mL chemo infusion  370 mg Intravenous Once Curt Bears, MD      . PACLitaxel (TAXOL) 270 mg in sodium chloride 0.9 % 250 mL chemo infusion (> 80mg /m2)  150 mg/m2 (Treatment Plan Recorded) Intravenous Once Curt Bears, MD 98 mL/hr at 07/08/17 1115 270 mg at 07/08/17 1115    SURGICAL HISTORY:  Past Surgical History:  Procedure Laterality Date  . ABDOMINAL HYSTERECTOMY    . TOTAL HIP ARTHROPLASTY     right    REVIEW OF SYSTEMS:  Constitutional: positive for fatigue Eyes: negative Ears, nose, mouth, throat, and face: negative Respiratory: positive for dyspnea on exertion Cardiovascular: negative Gastrointestinal: negative Genitourinary:negative Integument/breast: negative Hematologic/lymphatic: negative Musculoskeletal:positive for muscle weakness Neurological: positive for paresthesia Behavioral/Psych: negative Endocrine: negative Allergic/Immunologic: negative   PHYSICAL EXAMINATION: General appearance: alert, cooperative, fatigued and no distress Head: Normocephalic, without obvious abnormality, atraumatic Neck: no adenopathy, no JVD, supple, symmetrical, trachea midline and thyroid not enlarged, symmetric, no tenderness/mass/nodules Lymph nodes: Cervical, supraclavicular, and axillary nodes normal. Resp: clear to auscultation bilaterally Back: symmetric, no curvature. ROM normal. No CVA tenderness. Cardio: regular rate and rhythm, S1, S2 normal, no murmur, click, rub or gallop GI: soft, non-tender; bowel sounds normal; no masses,  no organomegaly Extremities: extremities normal, atraumatic, no cyanosis or edema Neurologic: Alert and oriented X 3, normal strength and tone. Normal symmetric reflexes. Normal coordination and gait  ECOG PERFORMANCE STATUS: 1 - Symptomatic but completely ambulatory  There were no vitals  taken for this visit.  LABORATORY DATA: Lab Results  Component Value Date   WBC 3.6 (L) 07/08/2017   HGB 10.2 (L) 07/08/2017   HCT 32.1 (L) 07/08/2017   MCV 103.9 (H) 07/08/2017   PLT 91 (L) 07/08/2017      Chemistry      Component Value Date/Time   NA 138 07/08/2017 0831   K 3.3 (L) 07/08/2017 0831   CL 101 07/04/2016 1211   CO2 24 07/08/2017 0831   BUN 4.5 (L) 07/08/2017 0831   CREATININE 0.6 07/08/2017 0831      Component Value Date/Time   CALCIUM 9.0 07/08/2017 0831   ALKPHOS 119 07/08/2017 0831   AST 25 07/08/2017 0831   ALT 16 07/08/2017 0831   BILITOT 0.52 07/08/2017 0831       RADIOGRAPHIC STUDIES: Ct Chest W Contrast  Result Date: 06/19/2017 CLINICAL DATA:  Lung cancer diagnosis July 2018. Ongoing chemotherapy radiation therapy. RIGHT arm fracture. EXAM: CT CHEST, ABDOMEN, AND PELVIS WITH CONTRAST TECHNIQUE: Multidetector CT imaging of the chest, abdomen and pelvis was performed following the standard protocol during bolus administration of intravenous contrast. CONTRAST:  184mL ISOVUE-300 IOPAMIDOL (ISOVUE-300) INJECTION 61% COMPARISON:  PET-CT 03-16-2017, CT chest 03/18/2017 FINDINGS: CT CHEST FINDINGS Cardiovascular: Coronary artery calcification and aortic atherosclerotic calcification. Apparent course of the Mediastinum/Nodes: No axillary or supraclavicular  adenopathy. Some haziness in the AP window at site of prior nodal mass. No measurable nodal mass remains. No pericardial fluid Lungs/Pleura: Interval decrease in size of LEFT upper lobe mass measuring 3.0 by 2.9 cm decreased from 5.1 x 4.2 cm. No new pulmonary nodules. Musculoskeletal: No aggressive osseous lesion. CT ABDOMEN AND PELVIS FINDINGS Hepatobiliary: No focal hepatic lesion. No biliary ductal dilatation. Gallbladder is normal. Common bile duct is normal. Pancreas: Pancreas is normal. No ductal dilatation. No pancreatic inflammation. Spleen: Normal spleen Adrenals/urinary tract: Adrenal glands and kidneys  are normal. The ureters and bladder normal. Stomach/Bowel: Stomach, small bowel, appendix, and cecum are normal. The colon and rectosigmoid colon are normal. Vascular/Lymphatic: Abdominal aorta is normal caliber with atherosclerotic calcification. There is no retroperitoneal or periportal lymphadenopathy. No pelvic lymphadenopathy. Reproductive: Post hysterectomy Other: No peritoneal metastasis Musculoskeletal: No skeletal metastasis identified IMPRESSION: Chest Impression: 1. Reduction in volume of the LEFT upper lobe mass. 2. Reduction in volume of mediastinal lymphadenopathy with ill-defined tissue in AP window at site of previous nodal mass. 3. No new or progressive disease in the thorax. Abdomen / Pelvis Impression: 1. No evidence of metastatic disease in the abdomen pelvis. 2. No skeletal metastasis identified. Electronically Signed   By: Suzy Bouchard M.D.   On: 06/19/2017 17:22   Ct Abdomen Pelvis W Contrast  Result Date: 06/19/2017 CLINICAL DATA:  Lung cancer diagnosis July 2018. Ongoing chemotherapy radiation therapy. RIGHT arm fracture. EXAM: CT CHEST, ABDOMEN, AND PELVIS WITH CONTRAST TECHNIQUE: Multidetector CT imaging of the chest, abdomen and pelvis was performed following the standard protocol during bolus administration of intravenous contrast. CONTRAST:  18mL ISOVUE-300 IOPAMIDOL (ISOVUE-300) INJECTION 61% COMPARISON:  PET-CT 10/14/2016, CT chest 03/18/2017 FINDINGS: CT CHEST FINDINGS Cardiovascular: Coronary artery calcification and aortic atherosclerotic calcification. Apparent course of the Mediastinum/Nodes: No axillary or supraclavicular adenopathy. Some haziness in the AP window at site of prior nodal mass. No measurable nodal mass remains. No pericardial fluid Lungs/Pleura: Interval decrease in size of LEFT upper lobe mass measuring 3.0 by 2.9 cm decreased from 5.1 x 4.2 cm. No new pulmonary nodules. Musculoskeletal: No aggressive osseous lesion. CT ABDOMEN AND PELVIS FINDINGS  Hepatobiliary: No focal hepatic lesion. No biliary ductal dilatation. Gallbladder is normal. Common bile duct is normal. Pancreas: Pancreas is normal. No ductal dilatation. No pancreatic inflammation. Spleen: Normal spleen Adrenals/urinary tract: Adrenal glands and kidneys are normal. The ureters and bladder normal. Stomach/Bowel: Stomach, small bowel, appendix, and cecum are normal. The colon and rectosigmoid colon are normal. Vascular/Lymphatic: Abdominal aorta is normal caliber with atherosclerotic calcification. There is no retroperitoneal or periportal lymphadenopathy. No pelvic lymphadenopathy. Reproductive: Post hysterectomy Other: No peritoneal metastasis Musculoskeletal: No skeletal metastasis identified IMPRESSION: Chest Impression: 1. Reduction in volume of the LEFT upper lobe mass. 2. Reduction in volume of mediastinal lymphadenopathy with ill-defined tissue in AP window at site of previous nodal mass. 3. No new or progressive disease in the thorax. Abdomen / Pelvis Impression: 1. No evidence of metastatic disease in the abdomen pelvis. 2. No skeletal metastasis identified. Electronically Signed   By: Suzy Bouchard M.D.   On: 06/19/2017 17:22    ASSESSMENT AND PLAN: This is a very pleasant 77 years old white female with stage IV (T2b, N3, M1 C) non-small cell lung cancer, squamous cell carcinoma presented with large left upper lobe lung mass in addition to mediastinal and supraclavicular lymphadenopathy as well as metastatic brain lesions diagnosed in September 2018. The patient underwent a course of palliative radiotherapy to the left  upper lobe lung mass as well as the metastatic brain lesions. She is currently undergoing palliative systemic chemotherapy with carboplatin for AC of 5 and paclitaxel 175 mg/M2 every 3 weeks, status post 4 cycles. She tolerated the last cycle of her treatment well except for fatigue. The patient had repeat CT scan of the chest, abdomen and pelvis performed  recently that showed reduction in the left upper lobe lung mass as well as the mediastinal lymphadenopathy with no new or progressive disease in the chest, abdomen or pelvis. I discussed the scan results with the patient and her son and recommended for her to continue her treatment and she would proceed with cycle #5 today. She will come back for follow-up visit in 3 weeks for evaluation before starting cycle #6. The patient was advised to call immediately if she has any concerning symptoms in the interval. The patient and her husband agreed to the current plan. The patient voices understanding of current disease status and treatment options and is in agreement with the current care plan. All questions were answered. The patient knows to call the clinic with any problems, questions or concerns. We can certainly see the patient much sooner if necessary.  Disclaimer: This note was dictated with voice recognition software. Similar sounding words can inadvertently be transcribed and may not be corrected upon review.

## 2017-07-08 NOTE — Patient Instructions (Addendum)
[facility] Discharge Instructions for Patients Receiving Chemotherapy  Today you received the following chemotherapy agents: Paclitaxel (Taxol) and Carboplatin (Paraplatin).  To help prevent nausea and vomiting after your treatment, we encourage you to take your nausea medication as prescribed. Had Aloxi prior to treatment-->take Compazine for next 3 days.  If you develop nausea and vomiting that is not controlled by your nausea medication, call the clinic.   BELOW ARE SYMPTOMS THAT SHOULD BE REPORTED IMMEDIATELY:  *FEVER GREATER THAN 100.5 F  *CHILLS WITH OR WITHOUT FEVER  NAUSEA AND VOMITING THAT IS NOT CONTROLLED WITH YOUR NAUSEA MEDICATION  *UNUSUAL SHORTNESS OF BREATH  *UNUSUAL BRUISING OR BLEEDING  TENDERNESS IN MOUTH AND THROAT WITH OR WITHOUT PRESENCE OF ULCERS  *URINARY PROBLEMS  *BOWEL PROBLEMS  UNUSUAL RASH Items with * indicate a potential emergency and should be followed up as soon as possible.  Feel free to call the clinic should you have any questions or concerns. The clinic phone number is [phone].  Please show the [facility] at check-in to the Emergency Department and triage nurse.

## 2017-07-10 ENCOUNTER — Ambulatory Visit: Payer: Medicare Other | Admitting: Oncology

## 2017-07-10 ENCOUNTER — Other Ambulatory Visit: Payer: Self-pay | Admitting: Urology

## 2017-07-10 ENCOUNTER — Ambulatory Visit (HOSPITAL_BASED_OUTPATIENT_CLINIC_OR_DEPARTMENT_OTHER): Payer: Medicare Other

## 2017-07-10 ENCOUNTER — Other Ambulatory Visit: Payer: Self-pay | Admitting: Oncology

## 2017-07-10 ENCOUNTER — Ambulatory Visit
Admission: RE | Admit: 2017-07-10 | Discharge: 2017-07-10 | Disposition: A | Discharge status: Home / Self Care | Payer: Medicare Other | Source: Ambulatory Visit | Attending: Radiation Oncology | Admitting: Radiation Oncology

## 2017-07-10 VITALS — BP 122/66 | HR 100 | Temp 97.9°F | Resp 18

## 2017-07-10 DIAGNOSIS — C3412 Malignant neoplasm of upper lobe, left bronchus or lung: Secondary | ICD-10-CM | POA: Diagnosis not present

## 2017-07-10 DIAGNOSIS — C7931 Secondary malignant neoplasm of brain: Secondary | ICD-10-CM | POA: Diagnosis not present

## 2017-07-10 DIAGNOSIS — C778 Secondary and unspecified malignant neoplasm of lymph nodes of multiple regions: Secondary | ICD-10-CM | POA: Diagnosis not present

## 2017-07-10 DIAGNOSIS — C7949 Secondary malignant neoplasm of other parts of nervous system: Principal | ICD-10-CM

## 2017-07-10 DIAGNOSIS — C3490 Malignant neoplasm of unspecified part of unspecified bronchus or lung: Secondary | ICD-10-CM | POA: Diagnosis not present

## 2017-07-10 DIAGNOSIS — Z5189 Encounter for other specified aftercare: Secondary | ICD-10-CM

## 2017-07-10 MED ORDER — GADOBENATE DIMEGLUMINE 529 MG/ML IV SOLN
12.0000 mL | Freq: Once | INTRAVENOUS | Status: AC | PRN
  Administered 2017-07-10: 12 mL via INTRAVENOUS

## 2017-07-10 MED ORDER — PEGFILGRASTIM INJECTION 6 MG/0.6ML ~~LOC~~
6.0000 mg | PREFILLED_SYRINGE | Freq: Once | SUBCUTANEOUS | Status: AC
  Administered 2017-07-10: 6 mg via SUBCUTANEOUS

## 2017-07-10 MED ORDER — PEGFILGRASTIM INJECTION 6 MG/0.6ML ~~LOC~~
PREFILLED_SYRINGE | SUBCUTANEOUS | Status: AC
  Filled 2017-07-10: qty 0.6

## 2017-07-10 MED ORDER — LORAZEPAM 0.5 MG PO TABS: 0.5000 mg | ORAL_TABLET | ORAL | 0 refills | Status: DC | PRN

## 2017-07-10 NOTE — Patient Instructions (Signed)
Pegfilgrastim injection What is this medicine? PEGFILGRASTIM (PEG fil gra stim) is a long-acting granulocyte colony-stimulating factor that stimulates the growth of neutrophils, a type of white blood cell important in the body's fight against infection. It is used to reduce the incidence of fever and infection in patients with certain types of cancer who are receiving chemotherapy that affects the bone marrow, and to increase survival after being exposed to high doses of radiation. This medicine may be used for other purposes; ask your health care provider or pharmacist if you have questions. COMMON BRAND NAME(S): Neulasta What should I tell my health care provider before I take this medicine? They need to know if you have any of these conditions: -kidney disease -latex allergy -ongoing radiation therapy -sickle cell disease -skin reactions to acrylic adhesives (On-Body Injector only) -an unusual or allergic reaction to pegfilgrastim, filgrastim, other medicines, foods, dyes, or preservatives -pregnant or trying to get pregnant -breast-feeding How should I use this medicine? This medicine is for injection under the skin. If you get this medicine at home, you will be taught how to prepare and give the pre-filled syringe or how to use the On-body Injector. Refer to the patient Instructions for Use for detailed instructions. Use exactly as directed. Tell your healthcare provider immediately if you suspect that the On-body Injector may not have performed as intended or if you suspect the use of the On-body Injector resulted in a missed or partial dose. It is important that you put your used needles and syringes in a special sharps container. Do not put them in a trash can. If you do not have a sharps container, call your pharmacist or healthcare provider to get one. Talk to your pediatrician regarding the use of this medicine in children. While this drug may be prescribed for selected conditions,  precautions do apply. Overdosage: If you think you have taken too much of this medicine contact a poison control center or emergency room at once. NOTE: This medicine is only for you. Do not share this medicine with others. What if I miss a dose? It is important not to miss your dose. Call your doctor or health care professional if you miss your dose. If you miss a dose due to an On-body Injector failure or leakage, a new dose should be administered as soon as possible using a single prefilled syringe for manual use. What may interact with this medicine? Interactions have not been studied. Give your health care provider a list of all the medicines, herbs, non-prescription drugs, or dietary supplements you use. Also tell them if you smoke, drink alcohol, or use illegal drugs. Some items may interact with your medicine. This list may not describe all possible interactions. Give your health care provider a list of all the medicines, herbs, non-prescription drugs, or dietary supplements you use. Also tell them if you smoke, drink alcohol, or use illegal drugs. Some items may interact with your medicine. What should I watch for while using this medicine? You may need blood work done while you are taking this medicine. If you are going to need a MRI, CT scan, or other procedure, tell your doctor that you are using this medicine (On-Body Injector only). What side effects may I notice from receiving this medicine? Side effects that you should report to your doctor or health care professional as soon as possible: -allergic reactions like skin rash, itching or hives, swelling of the face, lips, or tongue -dizziness -fever -pain, redness, or irritation at site   where injected -pinpoint red spots on the skin -red or dark-brown urine -shortness of breath or breathing problems -stomach or side pain, or pain at the shoulder -swelling -tiredness -trouble passing urine or change in the amount of urine Side  effects that usually do not require medical attention (report to your doctor or health care professional if they continue or are bothersome): -bone pain -muscle pain This list may not describe all possible side effects. Call your doctor for medical advice about side effects. You may report side effects to FDA at 1-800-FDA-1088. Where should I keep my medicine? Keep out of the reach of children. Store pre-filled syringes in a refrigerator between 2 and 8 degrees C (36 and 46 degrees F). Do not freeze. Keep in carton to protect from light. Throw away this medicine if it is left out of the refrigerator for more than 48 hours. Throw away any unused medicine after the expiration date. NOTE: This sheet is a summary. It may not cover all possible information. If you have questions about this medicine, talk to your doctor, pharmacist, or health care provider.  2018 Elsevier/Gold Standard (2016-06-19 12:58:03)  

## 2017-07-15 ENCOUNTER — Telehealth: Payer: Self-pay | Admitting: Internal Medicine

## 2017-07-15 ENCOUNTER — Ambulatory Visit
Admission: RE | Admit: 2017-07-15 | Discharge: 2017-07-15 | Disposition: A | Discharge status: Home / Self Care | Payer: Medicare Other | Source: Ambulatory Visit | Attending: Radiation Oncology | Admitting: Radiation Oncology

## 2017-07-15 ENCOUNTER — Telehealth: Payer: Self-pay | Admitting: Urology

## 2017-07-15 ENCOUNTER — Inpatient Hospital Stay: Payer: Medicare Other

## 2017-07-15 DIAGNOSIS — R05 Cough: Secondary | ICD-10-CM | POA: Diagnosis not present

## 2017-07-15 DIAGNOSIS — C7931 Secondary malignant neoplasm of brain: Secondary | ICD-10-CM | POA: Diagnosis not present

## 2017-07-15 DIAGNOSIS — C3412 Malignant neoplasm of upper lobe, left bronchus or lung: Secondary | ICD-10-CM | POA: Diagnosis not present

## 2017-07-15 DIAGNOSIS — R0609 Other forms of dyspnea: Secondary | ICD-10-CM | POA: Diagnosis not present

## 2017-07-15 DIAGNOSIS — C3492 Malignant neoplasm of unspecified part of left bronchus or lung: Secondary | ICD-10-CM

## 2017-07-15 DIAGNOSIS — Z5111 Encounter for antineoplastic chemotherapy: Secondary | ICD-10-CM | POA: Diagnosis not present

## 2017-07-15 DIAGNOSIS — C778 Secondary and unspecified malignant neoplasm of lymph nodes of multiple regions: Secondary | ICD-10-CM | POA: Diagnosis not present

## 2017-07-15 DIAGNOSIS — G620 Drug-induced polyneuropathy: Secondary | ICD-10-CM | POA: Diagnosis not present

## 2017-07-15 DIAGNOSIS — Z5189 Encounter for other specified aftercare: Secondary | ICD-10-CM | POA: Diagnosis not present

## 2017-07-15 LAB — CBC WITH DIFFERENTIAL/PLATELET
Abs Granulocyte: 2 10*3/uL (ref 1.5–6.5)
Basophils Absolute: 0 10*3/uL (ref 0.0–0.1)
Basophils Relative: 1 %
EOS PCT: 1 %
Eosinophils Absolute: 0 10*3/uL (ref 0.0–0.5)
HCT: 29.5 % — ABNORMAL LOW (ref 34.8–46.6)
Hemoglobin: 9.6 g/dL — ABNORMAL LOW (ref 11.6–15.9)
LYMPHS ABS: 0.8 10*3/uL — AB (ref 0.9–3.3)
LYMPHS PCT: 23 %
MCH: 33.8 pg (ref 25.1–34.0)
MCHC: 32.5 g/dL (ref 31.5–36.0)
MCV: 103.9 fL — AB (ref 79.5–101.0)
Monocytes Absolute: 0.6 10*3/uL (ref 0.1–0.9)
Monocytes Relative: 19 %
Neutro Abs: 2 10*3/uL (ref 1.5–6.5)
Neutrophils Relative %: 56 %
PLATELETS: 39 10*3/uL — AB (ref 145–400)
RBC: 2.84 MIL/uL — ABNORMAL LOW (ref 3.70–5.45)
RDW: 21.5 % — AB (ref 11.2–16.1)
WBC: 3.4 10*3/uL — ABNORMAL LOW (ref 3.9–10.3)

## 2017-07-15 LAB — COMPREHENSIVE METABOLIC PANEL
ALT: 26 U/L (ref 0–55)
AST: 35 U/L — AB (ref 5–34)
Albumin: 3.2 g/dL — ABNORMAL LOW (ref 3.5–5.0)
Alkaline Phosphatase: 139 U/L (ref 40–150)
Anion gap: 9 (ref 3–11)
BUN: 7 mg/dL (ref 7–26)
CHLORIDE: 103 mmol/L (ref 98–109)
CO2: 27 mmol/L (ref 22–29)
CREATININE: 0.66 mg/dL (ref 0.60–1.10)
Calcium: 9.3 mg/dL (ref 8.4–10.4)
GFR calc Af Amer: >60 mL/min (ref >60)
GFR calc non Af Amer: >60 mL/min (ref >60)
GLUCOSE: 114 mg/dL (ref 70–140)
Potassium: 3.7 mmol/L (ref 3.3–4.7)
SODIUM: 139 mmol/L (ref 136–145)
Total Bilirubin: 0.8 mg/dL (ref 0.2–1.2)
Total Protein: 7 g/dL (ref 6.4–8.3)

## 2017-07-15 NOTE — Telephone Encounter (Signed)
I spoke with the patient this morning to review results from her most recent brain MRI.  Her scan shows an excellent response to treatment with decreased size of both treated lesions and no evidence of new disease.  She is doing well and denies headaches, dizziness, imbalance, blurred or double vision, changes in visual or auditory acuity, tinnitus, tremors or seizure activity.  She continues with generalized weakness which she attributes to her systemic chemotherapy.  She also reports decreased appetite secondary to lack of taste but is eating to maintain her weight.  She denies increased cough, shortness of breath, hemoptysis, chest pain, fever or chills.  Her recent systemic imaging showed disease stability on current treatment.  She is scheduled to complete her last cycle of chemotherapy next month.  We will plan to repeat a brain MRI in 3 months for continued disease surveillance but she knows to call with any questions or concerns in the interim.   [name], PA-C

## 2017-07-15 NOTE — Telephone Encounter (Signed)
Spoke to patient regarding upcoming January through March appointments.

## 2017-07-22 ENCOUNTER — Inpatient Hospital Stay: Payer: Medicare Other

## 2017-07-22 DIAGNOSIS — C778 Secondary and unspecified malignant neoplasm of lymph nodes of multiple regions: Secondary | ICD-10-CM | POA: Diagnosis not present

## 2017-07-22 DIAGNOSIS — Z5111 Encounter for antineoplastic chemotherapy: Secondary | ICD-10-CM | POA: Diagnosis not present

## 2017-07-22 DIAGNOSIS — G620 Drug-induced polyneuropathy: Secondary | ICD-10-CM | POA: Diagnosis not present

## 2017-07-22 DIAGNOSIS — R0609 Other forms of dyspnea: Secondary | ICD-10-CM | POA: Diagnosis not present

## 2017-07-22 DIAGNOSIS — C3492 Malignant neoplasm of unspecified part of left bronchus or lung: Secondary | ICD-10-CM

## 2017-07-22 DIAGNOSIS — Z5189 Encounter for other specified aftercare: Secondary | ICD-10-CM | POA: Diagnosis not present

## 2017-07-22 DIAGNOSIS — R05 Cough: Secondary | ICD-10-CM | POA: Diagnosis not present

## 2017-07-22 DIAGNOSIS — C3412 Malignant neoplasm of upper lobe, left bronchus or lung: Secondary | ICD-10-CM | POA: Diagnosis not present

## 2017-07-22 DIAGNOSIS — C7931 Secondary malignant neoplasm of brain: Secondary | ICD-10-CM | POA: Diagnosis not present

## 2017-07-22 LAB — COMPREHENSIVE METABOLIC PANEL
ALBUMIN: 3.1 g/dL — AB (ref 3.5–5.0)
ALT: 11 U/L (ref 0–55)
AST: 20 U/L (ref 5–34)
Alkaline Phosphatase: 124 U/L (ref 40–150)
Anion gap: 9 (ref 3–11)
BUN: 4 mg/dL — ABNORMAL LOW (ref 7–26)
CHLORIDE: 105 mmol/L (ref 98–109)
CO2: 27 mmol/L (ref 22–29)
CREATININE: 0.59 mg/dL — AB (ref 0.60–1.10)
Calcium: 8.6 mg/dL (ref 8.4–10.4)
GFR calc Af Amer: >60 mL/min (ref >60)
GFR calc non Af Amer: >60 mL/min (ref >60)
GLUCOSE: 97 mg/dL (ref 70–140)
Potassium: 3.3 mmol/L (ref 3.3–4.7)
SODIUM: 141 mmol/L (ref 136–145)
Total Bilirubin: 0.7 mg/dL (ref 0.2–1.2)
Total Protein: 6.8 g/dL (ref 6.4–8.3)

## 2017-07-22 LAB — CBC WITH DIFFERENTIAL/PLATELET
Basophils Absolute: 0 10*3/uL (ref 0.0–0.1)
Basophils Relative: 0 %
EOS ABS: 0.1 10*3/uL (ref 0.0–0.5)
Eosinophils Relative: 1 %
HEMATOCRIT: 28.6 % — AB (ref 34.8–46.6)
HEMOGLOBIN: 9.5 g/dL — AB (ref 11.6–15.9)
LYMPHS ABS: 0.9 10*3/uL (ref 0.9–3.3)
Lymphocytes Relative: 15 %
MCH: 33.9 pg (ref 25.1–34.0)
MCHC: 33.1 g/dL (ref 31.5–36.0)
MCV: 102.5 fL — ABNORMAL HIGH (ref 79.5–101.0)
MONOS PCT: 10 %
Monocytes Absolute: 0.6 10*3/uL (ref 0.1–0.9)
NEUTROS ABS: 4.4 10*3/uL (ref 1.5–6.5)
Neutrophils Relative %: 74 %
Platelets: 93 10*3/uL — ABNORMAL LOW (ref 145–400)
RBC: 2.8 MIL/uL — ABNORMAL LOW (ref 3.70–5.45)
RDW: 23.7 % — ABNORMAL HIGH (ref 11.2–16.1)
WBC: 6 10*3/uL (ref 3.9–10.3)

## 2017-07-29 ENCOUNTER — Inpatient Hospital Stay (HOSPITAL_BASED_OUTPATIENT_CLINIC_OR_DEPARTMENT_OTHER): Payer: Medicare Other | Admitting: Internal Medicine

## 2017-07-29 ENCOUNTER — Telehealth: Payer: Self-pay | Admitting: Internal Medicine

## 2017-07-29 ENCOUNTER — Inpatient Hospital Stay: Payer: Medicare Other

## 2017-07-29 ENCOUNTER — Encounter: Payer: Self-pay | Admitting: Internal Medicine

## 2017-07-29 VITALS — BP 134/73 | HR 93 | Temp 97.2°F | Resp 16 | Wt 138.5 lb

## 2017-07-29 DIAGNOSIS — R05 Cough: Secondary | ICD-10-CM | POA: Diagnosis not present

## 2017-07-29 DIAGNOSIS — G620 Drug-induced polyneuropathy: Secondary | ICD-10-CM

## 2017-07-29 DIAGNOSIS — C778 Secondary and unspecified malignant neoplasm of lymph nodes of multiple regions: Secondary | ICD-10-CM | POA: Diagnosis not present

## 2017-07-29 DIAGNOSIS — Z5111 Encounter for antineoplastic chemotherapy: Secondary | ICD-10-CM | POA: Diagnosis not present

## 2017-07-29 DIAGNOSIS — C3490 Malignant neoplasm of unspecified part of unspecified bronchus or lung: Secondary | ICD-10-CM

## 2017-07-29 DIAGNOSIS — C7931 Secondary malignant neoplasm of brain: Secondary | ICD-10-CM

## 2017-07-29 DIAGNOSIS — R0609 Other forms of dyspnea: Secondary | ICD-10-CM

## 2017-07-29 DIAGNOSIS — Z5189 Encounter for other specified aftercare: Secondary | ICD-10-CM | POA: Diagnosis not present

## 2017-07-29 DIAGNOSIS — C3412 Malignant neoplasm of upper lobe, left bronchus or lung: Secondary | ICD-10-CM

## 2017-07-29 DIAGNOSIS — C3492 Malignant neoplasm of unspecified part of left bronchus or lung: Secondary | ICD-10-CM

## 2017-07-29 LAB — COMPREHENSIVE METABOLIC PANEL
ALBUMIN: 3 g/dL — AB (ref 3.5–5.0)
ALK PHOS: 111 U/L (ref 40–150)
ALT: 16 U/L (ref 0–55)
AST: 29 U/L (ref 5–34)
Anion gap: 6 (ref 3–11)
BUN: 4 mg/dL — AB (ref 7–26)
CALCIUM: 9.2 mg/dL (ref 8.4–10.4)
CO2: 27 mmol/L (ref 22–29)
CREATININE: 0.64 mg/dL (ref 0.60–1.10)
Chloride: 106 mmol/L (ref 98–109)
GFR calc Af Amer: >60 mL/min (ref >60)
GFR calc non Af Amer: >60 mL/min (ref >60)
Glucose, Bld: 114 mg/dL (ref 70–140)
Potassium: 3.8 mmol/L (ref 3.3–4.7)
SODIUM: 139 mmol/L (ref 136–145)
Total Bilirubin: 0.6 mg/dL (ref 0.2–1.2)
Total Protein: 7 g/dL (ref 6.4–8.3)

## 2017-07-29 LAB — CBC WITH DIFFERENTIAL/PLATELET
Basophils Absolute: 0 10*3/uL (ref 0.0–0.1)
Basophils Relative: 1 %
EOS ABS: 0 10*3/uL (ref 0.0–0.5)
EOS PCT: 1 %
HCT: 32 % — ABNORMAL LOW (ref 34.8–46.6)
Hemoglobin: 10.5 g/dL — ABNORMAL LOW (ref 11.6–15.9)
LYMPHS ABS: 1 10*3/uL (ref 0.9–3.3)
Lymphocytes Relative: 27 %
MCH: 34.5 pg — AB (ref 25.1–34.0)
MCHC: 32.7 g/dL (ref 31.5–36.0)
MCV: 105.5 fL — ABNORMAL HIGH (ref 79.5–101.0)
MONOS PCT: 19 %
Monocytes Absolute: 0.7 10*3/uL (ref 0.1–0.9)
Neutro Abs: 1.9 10*3/uL (ref 1.5–6.5)
Neutrophils Relative %: 52 %
PLATELETS: 118 10*3/uL — AB (ref 145–400)
RBC: 3.03 MIL/uL — ABNORMAL LOW (ref 3.70–5.45)
RDW: 23.4 % — ABNORMAL HIGH (ref 11.2–16.1)
WBC: 3.6 10*3/uL — ABNORMAL LOW (ref 3.9–10.3)

## 2017-07-29 MED ORDER — SODIUM CHLORIDE 0.9 % IV SOLN
150.0000 mg/m2 | Freq: Once | INTRAVENOUS | Status: AC
  Administered 2017-07-29: 270 mg via INTRAVENOUS
  Filled 2017-07-29: qty 45

## 2017-07-29 MED ORDER — PALONOSETRON HCL INJECTION 0.25 MG/5ML
INTRAVENOUS | Status: AC
  Filled 2017-07-29: qty 5

## 2017-07-29 MED ORDER — FAMOTIDINE IN NACL 20-0.9 MG/50ML-% IV SOLN
INTRAVENOUS | Status: AC
  Filled 2017-07-29: qty 50

## 2017-07-29 MED ORDER — DIPHENHYDRAMINE HCL 50 MG/ML IJ SOLN
INTRAMUSCULAR | Status: AC
  Filled 2017-07-29: qty 1

## 2017-07-29 MED ORDER — HEPARIN SOD (PORK) LOCK FLUSH 100 UNIT/ML IV SOLN
500.0000 [IU] | Freq: Once | INTRAVENOUS | Status: DC | PRN
  Filled 2017-07-29: qty 5

## 2017-07-29 MED ORDER — SODIUM CHLORIDE 0.9% FLUSH
10.0000 mL | INTRAVENOUS | Status: DC | PRN
  Filled 2017-07-29: qty 10

## 2017-07-29 MED ORDER — SODIUM CHLORIDE 0.9 % IV SOLN
370.0000 mg | Freq: Once | INTRAVENOUS | Status: AC
  Administered 2017-07-29: 370 mg via INTRAVENOUS
  Filled 2017-07-29: qty 37

## 2017-07-29 MED ORDER — SODIUM CHLORIDE 0.9 % IV SOLN
Freq: Once | INTRAVENOUS | Status: AC
  Administered 2017-07-29: 11:00:00 via INTRAVENOUS

## 2017-07-29 MED ORDER — DEXAMETHASONE SODIUM PHOSPHATE 100 MG/10ML IJ SOLN
20.0000 mg | Freq: Once | INTRAMUSCULAR | Status: AC
  Administered 2017-07-29: 20 mg via INTRAVENOUS
  Filled 2017-07-29: qty 2

## 2017-07-29 MED ORDER — FAMOTIDINE IN NACL 20-0.9 MG/50ML-% IV SOLN
20.0000 mg | Freq: Once | INTRAVENOUS | Status: AC
  Administered 2017-07-29: 20 mg via INTRAVENOUS

## 2017-07-29 MED ORDER — PALONOSETRON HCL INJECTION 0.25 MG/5ML
0.2500 mg | Freq: Once | INTRAVENOUS | Status: AC
  Administered 2017-07-29: 0.25 mg via INTRAVENOUS

## 2017-07-29 MED ORDER — DIPHENHYDRAMINE HCL 50 MG/ML IJ SOLN
50.0000 mg | Freq: Once | INTRAMUSCULAR | Status: AC
  Administered 2017-07-29: 50 mg via INTRAVENOUS

## 2017-07-29 NOTE — Telephone Encounter (Signed)
No additional appts to schedule - appts already scheduled per 1/23 los.

## 2017-07-29 NOTE — Patient Instructions (Signed)
   [facility] Discharge Instructions for Patients Receiving Chemotherapy  Today you received the following chemotherapy agents Taxol and Carboplatin   To help prevent nausea and vomiting after your treatment, we encourage you to take your nausea medication as directed.    If you develop nausea and vomiting that is not controlled by your nausea medication, call the clinic.   BELOW ARE SYMPTOMS THAT SHOULD BE REPORTED IMMEDIATELY:  *FEVER GREATER THAN 100.5 F  *CHILLS WITH OR WITHOUT FEVER  NAUSEA AND VOMITING THAT IS NOT CONTROLLED WITH YOUR NAUSEA MEDICATION  *UNUSUAL SHORTNESS OF BREATH  *UNUSUAL BRUISING OR BLEEDING  TENDERNESS IN MOUTH AND THROAT WITH OR WITHOUT PRESENCE OF ULCERS  *URINARY PROBLEMS  *BOWEL PROBLEMS  UNUSUAL RASH Items with * indicate a potential emergency and should be followed up as soon as possible.  Feel free to call the clinic should you have any questions or concerns. The clinic phone number is [phone].  Please show the CHEMO ALERT CARD at check-in to the Emergency Department and triage nurse.   

## 2017-07-29 NOTE — Progress Notes (Signed)
[place] [phone]   Fax:([phone]) [place] [phone]  DIAGNOSIS: stage IV(T2b, N3, M1c)  lung cancer pending further staging workup and tissue diagnosis. The patient presented with large left upper lobe lung mass in addition to left hilar and mediastinal lymphadenopathy and left true vocal cord paralysis.  PRIOR THERAPY: Palliative radiotherapy to the large left upper lobe lung mass as well as metastatic brain lesions.  CURRENT THERAPY:  1) systemic chemotherapy with carboplatin for AUC of 5 and paclitaxel 175 MG/M2 every 3 weeks. First dose on 04/15/2017.  Status post 5 cycles.  Paclitaxel has been reduced to 150 mg/M2 starting from cycle #4 secondary to peripheral neuropathy.  INTERVAL HISTORY: Jacqueline Kidd 77 y.o. female returns to the clinic today for follow-up visit accompanied by her son.  The patient continues to tolerate her treatment with carboplatin and paclitaxel fairly well except for the peripheral neuropathy and she is currently on Neurontin.  She denied having any current chest pain but has shortness breath with exertion as well as cough with thick mucus with no hemoptysis.  She denied having any recent nausea, vomiting, diarrhea or constipation.  She lost a few pounds since her last visit.  She is here today for evaluation before starting cycle #6.   MEDICAL HISTORY: Past Medical History:  Diagnosis Date  . Chronic low back pain   . COPD (chronic obstructive pulmonary disease) ([place])   . GERD (gastroesophageal reflux disease)   . Hyperlipidemia   . Hypertension   . Lesion of left lung   . Osteoporosis     ALLERGIES:  is allergic to fosamax [alendronate]; hydrocodone-acetaminophen; and lisinopril-hydrochlorothiazide.  MEDICATIONS:  Current Outpatient Medications  Medication Sig Dispense Refill  . acetaminophen (TYLENOL) 325 MG tablet Take 650 mg by mouth every 6  (six) hours as needed for mild pain or moderate pain.     Marland Kitchen amLODipine (NORVASC) 2.5 MG tablet Take 1 tablet (2.5 mg total) by mouth daily. (Patient not taking: Reported on 06/05/2017) 90 tablet 1  . aspirin EC 81 MG tablet Take 81 mg by mouth daily.    . cholecalciferol (VITAMIN D) 1000 units tablet Take 1,000 Units by mouth daily.    . fluconazole (DIFLUCAN) 100 MG tablet Take 1 tablet (100 mg total) by mouth daily. 10 tablet 0  . gabapentin (NEURONTIN) 100 MG capsule TAKE 1 CAPSULE BY MOUTH THREE TIMES A DAY 90 capsule 0  . lidocaine (XYLOCAINE) 2 % solution Use as directed 5 mLs in the mouth or throat as needed for mouth pain. Mix with 5 mL water in 10 mL syringe and instill past tongue (Patient not taking: Reported on 05/27/2017) 200 mL 5  . LORazepam (ATIVAN) 0.5 MG tablet Take 1 tablet (0.5 mg total) by mouth as needed for anxiety (take one tablet 30 minutes prior to MRI scans). 10 tablet 0  . Omega-3 Fatty Acids (FISH OIL) 1000 MG CAPS Take 1 capsule by mouth daily.    Marland Kitchen oxyCODONE-acetaminophen (PERCOCET/ROXICET) 5-325 MG tablet Take 1-2 tablets by mouth every 4 (four) hours as needed for severe pain. (Patient not taking: Reported on 06/22/2017) 30 tablet 0  . prochlorperazine (COMPAZINE) 10 MG tablet Take 1 tablet (10 mg total) by mouth every 6 (six) hours as needed for nausea or vomiting. (Patient not taking: Reported on 06/22/2017) 30 tablet 1  . sucralfate (CARAFATE) 1 g tablet Take 1  tablet (1 g total) by mouth 4 (four) times daily -  with meals and at bedtime. 5 min before meals for radiation induced esophagitis (Patient not taking: Reported on 05/27/2017) 120 tablet 2  . traMADol (ULTRAM) 50 MG tablet Take 1 tablet (50 mg total) by mouth every 8 (eight) hours as needed. (Patient not taking: Reported on 06/05/2017) 15 tablet 0   No current facility-administered medications for this visit.     SURGICAL HISTORY:  Past Surgical History:  Procedure Laterality Date  . ABDOMINAL  HYSTERECTOMY    . TOTAL HIP ARTHROPLASTY     right    REVIEW OF SYSTEMS:  A comprehensive review of systems was negative except for: Constitutional: positive for fatigue Respiratory: positive for dyspnea on exertion Neurological: positive for paresthesia   PHYSICAL EXAMINATION: General appearance: alert, cooperative, fatigued and no distress Head: Normocephalic, without obvious abnormality, atraumatic Neck: no adenopathy, no JVD, supple, symmetrical, trachea midline and thyroid not enlarged, symmetric, no tenderness/mass/nodules Lymph nodes: Cervical, supraclavicular, and axillary nodes normal. Resp: clear to auscultation bilaterally Back: symmetric, no curvature. ROM normal. No CVA tenderness. Cardio: regular rate and rhythm, S1, S2 normal, no murmur, click, rub or gallop GI: soft, non-tender; bowel sounds normal; no masses,  no organomegaly Extremities: extremities normal, atraumatic, no cyanosis or edema  ECOG PERFORMANCE STATUS: 1 - Symptomatic but completely ambulatory  Blood pressure 134/73, pulse 93, temperature (!) 97.2 F (36.2 C), temperature source Oral, resp. rate 16, weight 138 lb 8 oz (62.8 kg), SpO2 98 %.  LABORATORY DATA: Lab Results  Component Value Date   WBC 3.6 (L) 07/29/2017   HGB 10.5 (L) 07/29/2017   HCT 32.0 (L) 07/29/2017   MCV 105.5 (H) 07/29/2017   PLT 118 (L) 07/29/2017      Chemistry      Component Value Date/Time   NA 139 07/29/2017 0841   NA 138 07/08/2017 0831   K 3.8 07/29/2017 0841   K 3.3 (L) 07/08/2017 0831   CL 106 07/29/2017 0841   CO2 27 07/29/2017 0841   CO2 24 07/08/2017 0831   BUN 4 (L) 07/29/2017 0841   BUN 4.5 (L) 07/08/2017 0831   CREATININE 0.64 07/29/2017 0841   CREATININE 0.6 07/08/2017 0831      Component Value Date/Time   CALCIUM 9.2 07/29/2017 0841   CALCIUM 9.0 07/08/2017 0831   ALKPHOS 111 07/29/2017 0841   ALKPHOS 119 07/08/2017 0831   AST 29 07/29/2017 0841   AST 25 07/08/2017 0831   ALT 16 07/29/2017 0841     ALT 16 07/08/2017 0831   BILITOT 0.6 07/29/2017 0841   BILITOT 0.52 07/08/2017 0831       RADIOGRAPHIC STUDIES: Mr [name] QA Contrast  Result Date: 07/10/2017 CLINICAL DATA:  Non-small cell lung cancer with brain metastases status post SRS on 04/03/2017. EXAM: MRI HEAD WITHOUT AND WITH CONTRAST TECHNIQUE: Multiplanar, multiecho pulse sequences of the brain and surrounding structures were obtained without and with intravenous contrast. CONTRAST:  8mL MULTIHANCE GADOBENATE DIMEGLUMINE 529 MG/ML IV SOLN COMPARISON:  03/26/2017 FINDINGS: Brain: There is a subcentimeter focus of hyperintense trace diffusion signal in the right centrum semiovale with normal to at most subtly reduced ADC and no abnormal enhancement, likely an early subacute small vessel infarct. Note that diffusion imaging does not extend completely through the vertex. There is no evidence of acute infarct elsewhere. No intracranial hemorrhage, midline shift, or extra-axial fluid collection is seen. Patchy T2 hyperintensities in the cerebral white matter and pons are  similar to the prior study and nonspecific but compatible with moderate chronic small vessel ischemic disease. Chronic lacunar infarcts are again noted in the left thalamus and left lentiform nucleus/ external capsule. There is unchanged moderate cerebral atrophy. The ring-enhancing left cerebellar metastasis has decreased in size, now measuring 6 mm (series 13, image 38, previously 12 mm) with resolved edema. The posterior left insular metastasis has also greatly decreased in size, now measuring 2 mm (series 13, image 99, previously 7 mm) and remains without associated edema. No new enhancing brain lesions are identified. Vascular: Major intracranial vascular flow voids are preserved. Skull and upper cervical spine: No suspicious marrow lesion. Sinuses/Orbits: Bilateral cataract extraction. Paranasal sinuses and mastoid air cells are clear. Other: Grossly unchanged left parotid  nodules measuring 13 and 11 mm. IMPRESSION: 1. Decreased size of treated left insular and left cerebellar lesions. No residual edema. 2. No evidence of new intracranial metastases. 3. Subcentimeter diffusion abnormality in the right centrum semiovale without enhancement, likely an acute/ early subacute small vessel infarct. 4. Moderate chronic small vessel ischemic disease and cerebral atrophy. Electronically Signed   By: Logan Bores M.D.   On: 07/10/2017 15:37    ASSESSMENT AND PLAN: This is a very pleasant 77 years old white female with stage IV (T2b, N3, M1 C) non-small cell lung cancer, squamous cell carcinoma presented with large left upper lobe lung mass in addition to mediastinal and supraclavicular lymphadenopathy as well as metastatic brain lesions diagnosed in September 2018. The patient underwent a course of palliative radiotherapy to the left upper lobe lung mass as well as the metastatic brain lesions. She is currently undergoing palliative systemic chemotherapy with carboplatin for AC of 5 and paclitaxel 175 mg/M2 every 3 weeks, status post 5 cycles. She continues to tolerate this treatment well except for the peripheral neuropathy.  I recommended for the patient to proceed with cycle #6 today as a scheduled. I will see her back for follow-up visit in 3 weeks after repeating CT scan of the chest, abdomen and pelvis for restaging of her disease. For the peripheral neuropathy, she will continue on Neurontin. The patient was advised to call immediately if she has any concerning symptoms in the interval. The patient voices understanding of current disease status and treatment options and is in agreement with the current care plan. All questions were answered. The patient knows to call the clinic with any problems, questions or concerns. We can certainly see the patient much sooner if necessary.  Disclaimer: This note was dictated with voice recognition software. Similar sounding words can  inadvertently be transcribed and may not be corrected upon review.

## 2017-07-31 ENCOUNTER — Inpatient Hospital Stay: Payer: Medicare Other

## 2017-07-31 VITALS — BP 126/67 | HR 107 | Temp 98.0°F | Resp 18

## 2017-07-31 DIAGNOSIS — G620 Drug-induced polyneuropathy: Secondary | ICD-10-CM | POA: Diagnosis not present

## 2017-07-31 DIAGNOSIS — C7931 Secondary malignant neoplasm of brain: Secondary | ICD-10-CM | POA: Diagnosis not present

## 2017-07-31 DIAGNOSIS — Z5111 Encounter for antineoplastic chemotherapy: Secondary | ICD-10-CM | POA: Diagnosis not present

## 2017-07-31 DIAGNOSIS — R05 Cough: Secondary | ICD-10-CM | POA: Diagnosis not present

## 2017-07-31 DIAGNOSIS — C778 Secondary and unspecified malignant neoplasm of lymph nodes of multiple regions: Secondary | ICD-10-CM | POA: Diagnosis not present

## 2017-07-31 DIAGNOSIS — C3412 Malignant neoplasm of upper lobe, left bronchus or lung: Secondary | ICD-10-CM

## 2017-07-31 DIAGNOSIS — Z5189 Encounter for other specified aftercare: Secondary | ICD-10-CM | POA: Diagnosis not present

## 2017-07-31 DIAGNOSIS — R0609 Other forms of dyspnea: Secondary | ICD-10-CM | POA: Diagnosis not present

## 2017-07-31 MED ORDER — PEGFILGRASTIM INJECTION 6 MG/0.6ML ~~LOC~~
6.0000 mg | PREFILLED_SYRINGE | Freq: Once | SUBCUTANEOUS | Status: AC
  Administered 2017-07-31: 6 mg via SUBCUTANEOUS

## 2017-07-31 MED ORDER — PEGFILGRASTIM INJECTION 6 MG/0.6ML ~~LOC~~
PREFILLED_SYRINGE | SUBCUTANEOUS | Status: AC
  Filled 2017-07-31: qty 0.6

## 2017-08-05 ENCOUNTER — Inpatient Hospital Stay: Payer: Medicare Other

## 2017-08-05 DIAGNOSIS — G620 Drug-induced polyneuropathy: Secondary | ICD-10-CM | POA: Diagnosis not present

## 2017-08-05 DIAGNOSIS — C7931 Secondary malignant neoplasm of brain: Secondary | ICD-10-CM | POA: Diagnosis not present

## 2017-08-05 DIAGNOSIS — Z5189 Encounter for other specified aftercare: Secondary | ICD-10-CM | POA: Diagnosis not present

## 2017-08-05 DIAGNOSIS — C3492 Malignant neoplasm of unspecified part of left bronchus or lung: Secondary | ICD-10-CM

## 2017-08-05 DIAGNOSIS — Z5111 Encounter for antineoplastic chemotherapy: Secondary | ICD-10-CM | POA: Diagnosis not present

## 2017-08-05 DIAGNOSIS — R05 Cough: Secondary | ICD-10-CM | POA: Diagnosis not present

## 2017-08-05 DIAGNOSIS — C3412 Malignant neoplasm of upper lobe, left bronchus or lung: Secondary | ICD-10-CM | POA: Diagnosis not present

## 2017-08-05 DIAGNOSIS — C778 Secondary and unspecified malignant neoplasm of lymph nodes of multiple regions: Secondary | ICD-10-CM | POA: Diagnosis not present

## 2017-08-05 DIAGNOSIS — R0609 Other forms of dyspnea: Secondary | ICD-10-CM | POA: Diagnosis not present

## 2017-08-05 LAB — COMPREHENSIVE METABOLIC PANEL
ALT: 19 U/L (ref 0–55)
ANION GAP: 7 (ref 3–11)
AST: 28 U/L (ref 5–34)
Albumin: 3.1 g/dL — ABNORMAL LOW (ref 3.5–5.0)
Alkaline Phosphatase: 126 U/L (ref 40–150)
BUN: 7 mg/dL (ref 7–26)
CALCIUM: 9.2 mg/dL (ref 8.4–10.4)
CHLORIDE: 102 mmol/L (ref 98–109)
CO2: 29 mmol/L (ref 22–29)
CREATININE: 0.61 mg/dL (ref 0.60–1.10)
Glucose, Bld: 102 mg/dL (ref 70–140)
Potassium: 3.8 mmol/L (ref 3.5–5.1)
SODIUM: 138 mmol/L (ref 136–145)
Total Bilirubin: 0.6 mg/dL (ref 0.2–1.2)
Total Protein: 7 g/dL (ref 6.4–8.3)

## 2017-08-05 LAB — CBC WITH DIFFERENTIAL/PLATELET
BASOS PCT: 1 %
Basophils Absolute: 0 10*3/uL (ref 0.0–0.1)
EOS PCT: 2 %
Eosinophils Absolute: 0.1 10*3/uL (ref 0.0–0.5)
HCT: 29.4 % — ABNORMAL LOW (ref 34.8–46.6)
Hemoglobin: 9.7 g/dL — ABNORMAL LOW (ref 11.6–15.9)
LYMPHS PCT: 21 %
Lymphs Abs: 0.7 10*3/uL — ABNORMAL LOW (ref 0.9–3.3)
MCH: 35 pg — AB (ref 25.1–34.0)
MCHC: 33 g/dL (ref 31.5–36.0)
MCV: 106.3 fL — ABNORMAL HIGH (ref 79.5–101.0)
MONOS PCT: 19 %
Monocytes Absolute: 0.7 10*3/uL (ref 0.1–0.9)
Neutro Abs: 2 10*3/uL (ref 1.5–6.5)
Neutrophils Relative %: 57 %
PLATELETS: 38 10*3/uL — AB (ref 145–400)
RBC: 2.77 MIL/uL — ABNORMAL LOW (ref 3.70–5.45)
RDW: 21.7 % — AB (ref 11.2–14.5)
WBC: 3.5 10*3/uL — AB (ref 3.9–10.3)

## 2017-08-12 ENCOUNTER — Other Ambulatory Visit: Payer: Self-pay | Admitting: Radiation Therapy

## 2017-08-12 ENCOUNTER — Inpatient Hospital Stay: Payer: Medicare Other | Attending: Internal Medicine

## 2017-08-12 ENCOUNTER — Encounter (HOSPITAL_COMMUNITY): Payer: Self-pay

## 2017-08-12 ENCOUNTER — Ambulatory Visit (HOSPITAL_COMMUNITY)
Admission: RE | Admit: 2017-08-12 | Discharge: 2017-08-12 | Disposition: A | Discharge status: Home / Self Care | Payer: Medicare Other | Source: Ambulatory Visit | Attending: Internal Medicine | Admitting: Internal Medicine

## 2017-08-12 DIAGNOSIS — R05 Cough: Secondary | ICD-10-CM | POA: Diagnosis not present

## 2017-08-12 DIAGNOSIS — C7931 Secondary malignant neoplasm of brain: Secondary | ICD-10-CM | POA: Insufficient documentation

## 2017-08-12 DIAGNOSIS — C7949 Secondary malignant neoplasm of other parts of nervous system: Principal | ICD-10-CM

## 2017-08-12 DIAGNOSIS — R918 Other nonspecific abnormal finding of lung field: Secondary | ICD-10-CM | POA: Insufficient documentation

## 2017-08-12 DIAGNOSIS — J849 Interstitial pulmonary disease, unspecified: Secondary | ICD-10-CM | POA: Insufficient documentation

## 2017-08-12 DIAGNOSIS — I700 Atherosclerosis of aorta: Secondary | ICD-10-CM | POA: Diagnosis not present

## 2017-08-12 DIAGNOSIS — C3492 Malignant neoplasm of unspecified part of left bronchus or lung: Secondary | ICD-10-CM

## 2017-08-12 DIAGNOSIS — K8020 Calculus of gallbladder without cholecystitis without obstruction: Secondary | ICD-10-CM | POA: Diagnosis not present

## 2017-08-12 DIAGNOSIS — C3412 Malignant neoplasm of upper lobe, left bronchus or lung: Secondary | ICD-10-CM | POA: Insufficient documentation

## 2017-08-12 DIAGNOSIS — J438 Other emphysema: Secondary | ICD-10-CM | POA: Insufficient documentation

## 2017-08-12 DIAGNOSIS — C3490 Malignant neoplasm of unspecified part of unspecified bronchus or lung: Secondary | ICD-10-CM | POA: Diagnosis not present

## 2017-08-12 DIAGNOSIS — C778 Secondary and unspecified malignant neoplasm of lymph nodes of multiple regions: Secondary | ICD-10-CM | POA: Insufficient documentation

## 2017-08-12 DIAGNOSIS — J984 Other disorders of lung: Secondary | ICD-10-CM | POA: Diagnosis not present

## 2017-08-12 DIAGNOSIS — Z5111 Encounter for antineoplastic chemotherapy: Secondary | ICD-10-CM | POA: Diagnosis present

## 2017-08-12 DIAGNOSIS — G620 Drug-induced polyneuropathy: Secondary | ICD-10-CM | POA: Insufficient documentation

## 2017-08-12 HISTORY — DX: Malignant (primary) neoplasm, unspecified: C80.1

## 2017-08-12 LAB — COMPREHENSIVE METABOLIC PANEL
ALK PHOS: 128 U/L (ref 40–150)
ALT: 15 U/L (ref 0–55)
ANION GAP: 8 (ref 3–11)
AST: 24 U/L (ref 5–34)
Albumin: 3.1 g/dL — ABNORMAL LOW (ref 3.5–5.0)
BUN: 4 mg/dL — ABNORMAL LOW (ref 7–26)
CO2: 26 mmol/L (ref 22–29)
Calcium: 9.1 mg/dL (ref 8.4–10.4)
Chloride: 105 mmol/L (ref 98–109)
Creatinine, Ser: 0.6 mg/dL (ref 0.60–1.10)
Glucose, Bld: 106 mg/dL (ref 70–140)
Potassium: 4.4 mmol/L (ref 3.5–5.1)
SODIUM: 139 mmol/L (ref 136–145)
Total Bilirubin: 0.6 mg/dL (ref 0.2–1.2)
Total Protein: 7 g/dL (ref 6.4–8.3)

## 2017-08-12 LAB — CBC WITH DIFFERENTIAL/PLATELET
BASOS ABS: 0 10*3/uL (ref 0.0–0.1)
Basophils Relative: 0 %
Eosinophils Absolute: 0.1 10*3/uL (ref 0.0–0.5)
Eosinophils Relative: 1 %
HEMATOCRIT: 29.6 % — AB (ref 34.8–46.6)
HEMOGLOBIN: 9.8 g/dL — AB (ref 11.6–15.9)
Lymphocytes Relative: 13 %
Lymphs Abs: 0.9 10*3/uL (ref 0.9–3.3)
MCH: 35.7 pg — ABNORMAL HIGH (ref 25.1–34.0)
MCHC: 33.1 g/dL (ref 31.5–36.0)
MCV: 108.1 fL — ABNORMAL HIGH (ref 79.5–101.0)
Monocytes Absolute: 0.7 10*3/uL (ref 0.1–0.9)
Monocytes Relative: 9 %
NEUTROS ABS: 5.7 10*3/uL (ref 1.5–6.5)
NEUTROS PCT: 77 %
Platelets: 82 10*3/uL — ABNORMAL LOW (ref 145–400)
RBC: 2.74 MIL/uL — AB (ref 3.70–5.45)
RDW: 22 % — ABNORMAL HIGH (ref 11.2–14.5)
WBC: 7.4 10*3/uL (ref 3.9–10.3)

## 2017-08-12 MED ORDER — IOPAMIDOL (ISOVUE-300) INJECTION 61%
INTRAVENOUS | Status: AC
  Filled 2017-08-12: qty 30

## 2017-08-12 MED ORDER — IOPAMIDOL (ISOVUE-300) INJECTION 61%
100.0000 mL | Freq: Once | INTRAVENOUS | Status: AC | PRN
  Administered 2017-08-12: 100 mL via INTRAVENOUS

## 2017-08-12 MED ORDER — IOPAMIDOL (ISOVUE-300) INJECTION 61%
30.0000 mL | Freq: Once | INTRAVENOUS | Status: AC | PRN
  Administered 2017-08-12: 30 mL via ORAL

## 2017-08-12 MED ORDER — IOPAMIDOL (ISOVUE-300) INJECTION 61%
INTRAVENOUS | Status: AC
  Filled 2017-08-12: qty 100

## 2017-08-19 ENCOUNTER — Inpatient Hospital Stay: Payer: Medicare Other

## 2017-08-19 ENCOUNTER — Other Ambulatory Visit: Payer: Self-pay | Admitting: Oncology

## 2017-08-19 ENCOUNTER — Inpatient Hospital Stay (HOSPITAL_BASED_OUTPATIENT_CLINIC_OR_DEPARTMENT_OTHER): Payer: Medicare Other | Admitting: Oncology

## 2017-08-19 ENCOUNTER — Telehealth: Payer: Self-pay

## 2017-08-19 ENCOUNTER — Encounter: Payer: Self-pay | Admitting: Oncology

## 2017-08-19 VITALS — BP 130/69 | HR 94 | Temp 97.7°F | Resp 18 | Wt 139.1 lb

## 2017-08-19 DIAGNOSIS — G620 Drug-induced polyneuropathy: Secondary | ICD-10-CM | POA: Diagnosis not present

## 2017-08-19 DIAGNOSIS — C3492 Malignant neoplasm of unspecified part of left bronchus or lung: Secondary | ICD-10-CM

## 2017-08-19 DIAGNOSIS — R05 Cough: Secondary | ICD-10-CM

## 2017-08-19 DIAGNOSIS — C778 Secondary and unspecified malignant neoplasm of lymph nodes of multiple regions: Secondary | ICD-10-CM

## 2017-08-19 DIAGNOSIS — C7931 Secondary malignant neoplasm of brain: Secondary | ICD-10-CM | POA: Diagnosis not present

## 2017-08-19 DIAGNOSIS — C3412 Malignant neoplasm of upper lobe, left bronchus or lung: Secondary | ICD-10-CM | POA: Diagnosis not present

## 2017-08-19 DIAGNOSIS — Z5111 Encounter for antineoplastic chemotherapy: Secondary | ICD-10-CM

## 2017-08-19 LAB — CBC WITH DIFFERENTIAL/PLATELET
BASOS ABS: 0 10*3/uL (ref 0.0–0.1)
Basophils Relative: 1 %
Eosinophils Absolute: 0 10*3/uL (ref 0.0–0.5)
Eosinophils Relative: 1 %
HEMATOCRIT: 29.5 % — AB (ref 34.8–46.6)
Hemoglobin: 9.8 g/dL — ABNORMAL LOW (ref 11.6–15.9)
LYMPHS PCT: 23 %
Lymphs Abs: 0.8 10*3/uL — ABNORMAL LOW (ref 0.9–3.3)
MCH: 36.3 pg — ABNORMAL HIGH (ref 25.1–34.0)
MCHC: 33.1 g/dL (ref 31.5–36.0)
MCV: 109.5 fL — AB (ref 79.5–101.0)
Monocytes Absolute: 0.6 10*3/uL (ref 0.1–0.9)
Monocytes Relative: 17 %
NEUTROS ABS: 2.1 10*3/uL (ref 1.5–6.5)
NEUTROS PCT: 58 %
Platelets: 113 10*3/uL — ABNORMAL LOW (ref 145–400)
RBC: 2.7 MIL/uL — AB (ref 3.70–5.45)
RDW: 20.7 % — ABNORMAL HIGH (ref 11.2–14.5)
WBC: 3.6 10*3/uL — AB (ref 3.9–10.3)

## 2017-08-19 LAB — COMPREHENSIVE METABOLIC PANEL WITH GFR
ALT: 14 U/L (ref 0–55)
AST: 29 U/L (ref 5–34)
Albumin: 3.1 g/dL — ABNORMAL LOW (ref 3.5–5.0)
Alkaline Phosphatase: 101 U/L (ref 40–150)
Anion gap: 8 (ref 3–11)
BUN: 5 mg/dL — ABNORMAL LOW (ref 7–26)
CO2: 26 mmol/L (ref 22–29)
Calcium: 9.1 mg/dL (ref 8.4–10.4)
Chloride: 105 mmol/L (ref 98–109)
Creatinine, Ser: 0.64 mg/dL (ref 0.60–1.10)
GFR calc Af Amer: >60 mL/min
GFR calc non Af Amer: >60 mL/min
Glucose, Bld: 101 mg/dL (ref 70–140)
Potassium: 3.8 mmol/L (ref 3.5–5.1)
Sodium: 139 mmol/L (ref 136–145)
Total Bilirubin: 0.6 mg/dL (ref 0.2–1.2)
Total Protein: 7.1 g/dL (ref 6.4–8.3)

## 2017-08-19 MED ORDER — GABAPENTIN 100 MG PO CAPS: ORAL_CAPSULE | ORAL | 2 refills | Status: DC

## 2017-08-19 NOTE — Progress Notes (Signed)
[place] OFFICE PROGRESS NOTE  Kidd, Jacqueline [id number]  DIAGNOSIS: stage IV(T2b, N3, M1c)  lung cancer, squamous cell carcinoma. The patient presented with large left upper lobe lung mass in addition to left hilar and mediastinal lymphadenopathy and left true vocal cord paralysis.  PDL 1 0%  PRIOR THERAPY: Palliative radiotherapy to the large left upper lobe lung mass as well as metastatic brain lesions.  CURRENT THERAPY: systemic chemotherapy with carboplatin for AUC of 5 and paclitaxel 175 MG/M2 every 3 weeks. First dose on 04/15/2017.  Status post 6 cycles.  Paclitaxel has been reduced to 150 mg/M2 starting from cycle #4 secondary to peripheral neuropathy.  INTERVAL HISTORY: Jacqueline Kidd 77 y.o. female returns for routine follow-up visit accompanied by her husband.  The patient is having more neuropathy related to her paclitaxel infusion.  She is currently on gabapentin 100 mg 3 times a day, but occasionally takes it 4 times a day.  She reports difficulty buttoning her buttons and holding onto utensils.  She has not had any falls.  She denies fevers and chills.  Denies chest pain, shortness breath, hemoptysis.  She has intermittent cough with thick mucus production.  She denies nausea, vomiting, constipation, diarrhea.  Denies recent weight loss or night sweats.  The patient is here for evaluation prior to cycle #7 of her chemotherapy and to review her restaging CT scan results.  MEDICAL HISTORY: Past Medical History:  Diagnosis Date  . Chronic low back pain   . COPD (chronic obstructive pulmonary disease) ([place])   . GERD (gastroesophageal reflux disease)   . Hyperlipidemia   . Hypertension   . Lesion of left lung   . lung ca dx'd 01/2017  . Osteoporosis     ALLERGIES:  is allergic to fosamax [alendronate]; hydrocodone-acetaminophen; and lisinopril-hydrochlorothiazide.  MEDICATIONS:  Current Outpatient Medications  Medication Sig  Dispense Refill  . acetaminophen (TYLENOL) 325 MG tablet Take 650 mg by mouth every 6 (six) hours as needed for mild pain or moderate pain.     Marland Kitchen gabapentin (NEURONTIN) 100 MG capsule Take 2 tabs three times a day. 180 capsule 2  . amLODipine (NORVASC) 2.5 MG tablet Take 1 tablet (2.5 mg total) by mouth daily. (Patient not taking: Reported on 06/05/2017) 90 tablet 1  . aspirin EC 81 MG tablet Take 81 mg by mouth daily.    . cholecalciferol (VITAMIN D) 1000 units tablet Take 1,000 Units by mouth daily.    Marland Kitchen LORazepam (ATIVAN) 0.5 MG tablet Take 1 tablet (0.5 mg total) by mouth as needed for anxiety (take one tablet 30 minutes prior to MRI scans). (Patient not taking: Reported on 07/29/2017) 10 tablet 0  . prochlorperazine (COMPAZINE) 10 MG tablet Take 1 tablet (10 mg total) by mouth every 6 (six) hours as needed for nausea or vomiting. (Patient not taking: Reported on 06/22/2017) 30 tablet 1   No current facility-administered medications for this visit.     SURGICAL HISTORY:  Past Surgical History:  Procedure Laterality Date  . ABDOMINAL HYSTERECTOMY    . TOTAL HIP ARTHROPLASTY     right    REVIEW OF SYSTEMS:   Review of Systems  Constitutional: Negative for appetite change, chills, fatigue, fever and unexpected weight change.  HENT:   Negative for mouth sores, nosebleeds, sore throat and trouble swallowing.   Eyes: Negative for eye problems and icterus.  Respiratory: Negative for hemoptysis, shortness of breath and wheezing.  Positive for  cough.  Cardiovascular: Negative for chest pain and leg swelling.  Gastrointestinal: Negative for abdominal pain, constipation, diarrhea, nausea and vomiting.  Genitourinary: Negative for bladder incontinence, difficulty urinating, dysuria, frequency and hematuria.   Musculoskeletal: Negative for back pain, gait problem, neck pain and neck stiffness.  Skin: Negative for itching and rash.  Neurological: Negative for dizziness, extremity weakness, gait  problem, headaches, light-headedness and seizures. Positive for neuropathy to her hands and feet. Hematological: Negative for adenopathy. Does not bruise/bleed easily.  Psychiatric/Behavioral: Negative for confusion, depression and sleep disturbance. The patient is not nervous/anxious.     PHYSICAL EXAMINATION:  Blood pressure 130/69, pulse 94, temperature 97.7 F (36.5 C), temperature source Oral, resp. rate 18, weight 139 lb 2 oz (63.1 kg), SpO2 96 %.  ECOG PERFORMANCE STATUS: 1 - Symptomatic but completely ambulatory  Physical Exam  Constitutional: Oriented to person, place, and time and well-developed, well-nourished, and in no distress. No distress.  HENT:  Head: Normocephalic and atraumatic.  Mouth/Throat: Oropharynx is clear and moist. No oropharyngeal exudate.  Eyes: Conjunctivae are normal. Right eye exhibits no discharge. Left eye exhibits no discharge. No scleral icterus.  Neck: Normal range of motion. Neck supple.  Cardiovascular: Normal rate, regular rhythm, normal heart sounds and intact distal pulses.   Pulmonary/Chest: Effort normal and breath sounds normal. No respiratory distress. No wheezes. No rales.  Abdominal: Soft. Bowel sounds are normal. Exhibits no distension and no mass. There is no tenderness.  Musculoskeletal: Normal range of motion. Exhibits no edema.  Lymphadenopathy:    No cervical adenopathy.  Neurological: Alert and oriented to person, place, and time. Exhibits normal muscle tone. Gait normal. Coordination normal. The patient has diminished sensation to her fingertips and to the soles of her feet. Skin: Skin is warm and dry. No rash noted. Not diaphoretic. No erythema. No pallor.  Psychiatric: Mood, memory and judgment normal.  Vitals reviewed.  LABORATORY DATA: Lab Results  Component Value Date   WBC 3.6 (L) 08/19/2017   HGB 9.8 (L) 08/19/2017   HCT 29.5 (L) 08/19/2017   MCV 109.5 (H) 08/19/2017   PLT 113 (L) 08/19/2017      Chemistry       Component Value Date/Time   NA 139 08/19/2017 0912   NA 138 07/08/2017 0831   K 3.8 08/19/2017 0912   K 3.3 (L) 07/08/2017 0831   CL 105 08/19/2017 0912   CO2 26 08/19/2017 0912   CO2 24 07/08/2017 0831   BUN 5 (L) 08/19/2017 0912   BUN 4.5 (L) 07/08/2017 0831   CREATININE 0.64 08/19/2017 0912   CREATININE 0.6 07/08/2017 0831      Component Value Date/Time   CALCIUM 9.1 08/19/2017 0912   CALCIUM 9.0 07/08/2017 0831   ALKPHOS 101 08/19/2017 0912   ALKPHOS 119 07/08/2017 0831   AST 29 08/19/2017 0912   AST 25 07/08/2017 0831   ALT 14 08/19/2017 0912   ALT 16 07/08/2017 0831   BILITOT 0.6 08/19/2017 0912   BILITOT 0.52 07/08/2017 0831       RADIOGRAPHIC STUDIES:  Ct Chest W Contrast  Result Date: 08/12/2017 CLINICAL DATA:  Lung cancer with cough and congestion. EXAM: CT CHEST, ABDOMEN, AND PELVIS WITH CONTRAST TECHNIQUE: Multidetector CT imaging of the chest, abdomen and pelvis was performed following the standard protocol during bolus administration of intravenous contrast. CONTRAST:  136mL ISOVUE-300 IOPAMIDOL (ISOVUE-300) INJECTION 61% COMPARISON:  Chest CT 06/19/2017 and PET-CT 01/18/2017 FINDINGS: CT CHEST FINDINGS Cardiovascular: The heart is normal in size and  stable. No pericardial effusion. Stable tortuosity and atherosclerotic calcifications involving the thoracic aorta. No dissection. Stable branch vessel calcifications. Aberrant right subclavian artery again demonstrated. Stable three-vessel coronary artery calcifications. Mediastinum/Nodes: Stable scattered mediastinal and hilar lymph nodes. No mass or residual overt adenopathy. 9 mm left hilar node on image number 23 is unchanged. The mid esophagus demonstrates mild wall thickening and surrounding edema, possibly related to radiation. Lungs/Pleura: Stable left upper lobe lung mass measuring approximately 3.2 x 3.1 cm. No new pulmonary lesions. No acute overlying pulmonary findings. Stable peripheral interstitial lung  disease and pulmonary emphysema. No findings to suggest pulmonary metastatic disease. Musculoskeletal: No breast masses, supraclavicular or axillary lymphadenopathy. Healing/partially united right humeral head and neck fracture. No worrisome bone lesions to suggest metastatic disease. Significant osteoporosis involving the spine with multiple lumbar compression fractures. CT ABDOMEN PELVIS FINDINGS Hepatobiliary: No focal hepatic lesions to suggest metastatic disease. Small gallstones are noted in the gallbladder. Stable common bile duct dilatation. Pancreas: The no mass, inflammation or ductal dilatation. Moderate stable atrophy. Spleen: Numerous small calcified granulomas.  No worrisome lesions. Adrenals/Urinary Tract: No adrenal gland metastasis are identified. The kidneys appear normal in stable. Stomach/Bowel: The stomach, duodenum, small bowel and colon are unremarkable. No acute inflammatory changes, mass lesions or obstructive findings. Vascular/Lymphatic: Stable advanced atherosclerotic calcifications involving the aorta and branch vessels but no aneurysm. The major venous structures are patent. No mesenteric or retroperitoneal mass or adenopathy. Small scattered lymph nodes are noted. Reproductive: The uterus is surgically absent. Both ovaries are still present and appear normal. Other: No pelvic mass or adenopathy. No free pelvic fluid collections. No inguinal mass or adenopathy. Musculoskeletal: Osteoporosis with lumbar compression fractures. Moderate artifact related to a right hip prosthesis. No acute bony findings or evidence of osseous metastatic disease. IMPRESSION: 1. Stable 3 cm left upper lobe lung mass. 2. No infiltrates, edema, effusions or metastatic pulmonary nodules. 3. Stable emphysematous changes, pulmonary scarring and peripheral interstitial lung disease. 4. No progressive mediastinal or hilar disease. 5. No findings for metastatic disease involving the abdomen/pelvis or bony structures.  6. Stable advanced atherosclerotic calcifications involving the thoracic and abdominal aorta and branch vessels and in particular coronary arteries. 7. Probable radiation changes involving the mid esophagus. Electronically Signed   By: Marijo Sanes M.D.   On: 08/12/2017 14:39   Ct Abdomen Pelvis W Contrast  Result Date: 08/12/2017 CLINICAL DATA:  Lung cancer with cough and congestion. EXAM: CT CHEST, ABDOMEN, AND PELVIS WITH CONTRAST TECHNIQUE: Multidetector CT imaging of the chest, abdomen and pelvis was performed following the standard protocol during bolus administration of intravenous contrast. CONTRAST:  12mL ISOVUE-300 IOPAMIDOL (ISOVUE-300) INJECTION 61% COMPARISON:  Chest CT 06/19/2017 and PET-CT 07-13-2016 FINDINGS: CT CHEST FINDINGS Cardiovascular: The heart is normal in size and stable. No pericardial effusion. Stable tortuosity and atherosclerotic calcifications involving the thoracic aorta. No dissection. Stable branch vessel calcifications. Aberrant right subclavian artery again demonstrated. Stable three-vessel coronary artery calcifications. Mediastinum/Nodes: Stable scattered mediastinal and hilar lymph nodes. No mass or residual overt adenopathy. 9 mm left hilar node on image number 23 is unchanged. The mid esophagus demonstrates mild wall thickening and surrounding edema, possibly related to radiation. Lungs/Pleura: Stable left upper lobe lung mass measuring approximately 3.2 x 3.1 cm. No new pulmonary lesions. No acute overlying pulmonary findings. Stable peripheral interstitial lung disease and pulmonary emphysema. No findings to suggest pulmonary metastatic disease. Musculoskeletal: No breast masses, supraclavicular or axillary lymphadenopathy. Healing/partially united right humeral head and neck fracture. No worrisome bone  lesions to suggest metastatic disease. Significant osteoporosis involving the spine with multiple lumbar compression fractures. CT ABDOMEN PELVIS FINDINGS  Hepatobiliary: No focal hepatic lesions to suggest metastatic disease. Small gallstones are noted in the gallbladder. Stable common bile duct dilatation. Pancreas: The no mass, inflammation or ductal dilatation. Moderate stable atrophy. Spleen: Numerous small calcified granulomas.  No worrisome lesions. Adrenals/Urinary Tract: No adrenal gland metastasis are identified. The kidneys appear normal in stable. Stomach/Bowel: The stomach, duodenum, small bowel and colon are unremarkable. No acute inflammatory changes, mass lesions or obstructive findings. Vascular/Lymphatic: Stable advanced atherosclerotic calcifications involving the aorta and branch vessels but no aneurysm. The major venous structures are patent. No mesenteric or retroperitoneal mass or adenopathy. Small scattered lymph nodes are noted. Reproductive: The uterus is surgically absent. Both ovaries are still present and appear normal. Other: No pelvic mass or adenopathy. No free pelvic fluid collections. No inguinal mass or adenopathy. Musculoskeletal: Osteoporosis with lumbar compression fractures. Moderate artifact related to a right hip prosthesis. No acute bony findings or evidence of osseous metastatic disease. IMPRESSION: 1. Stable 3 cm left upper lobe lung mass. 2. No infiltrates, edema, effusions or metastatic pulmonary nodules. 3. Stable emphysematous changes, pulmonary scarring and peripheral interstitial lung disease. 4. No progressive mediastinal or hilar disease. 5. No findings for metastatic disease involving the abdomen/pelvis or bony structures. 6. Stable advanced atherosclerotic calcifications involving the thoracic and abdominal aorta and branch vessels and in particular coronary arteries. 7. Probable radiation changes involving the mid esophagus. Electronically Signed   By: Marijo Sanes M.D.   On: 08/12/2017 14:39     ASSESSMENT/PLAN:  Primary cancer of left upper lobe of lung [facility] This is a very pleasant 77 year old white  female with stage IV (T2b, N3, M1 C) non-small cell lung cancer, squamous cell carcinoma presented with large left upper lobe lung mass in addition to mediastinal and supraclavicular lymphadenopathy as well as metastatic brain lesions diagnosed in September 2018. The patient underwent a course of palliative radiotherapy to the left upper lobe lung mass as well as the metastatic brain lesions. She is currently undergoing palliative systemic chemotherapy with carboplatin for AC of 5 and paclitaxel 175 mg/M2 every 3 weeks, status post 6 cycles. She continues to tolerate this treatment well except for the peripheral neuropathy. She had a recent restaging CT scan of the chest, abdomen, pelvis and is here to discuss the results.  The patient was seen with Dr. Julien Nordmann.  CT scan results were discussed with the patient and her husband which showed no evidence of disease progression.  We discussed giving her a break from chemotherapy.  The plan is to obtain restaging CT scans of the chest, abdomen, pelvis in approximately 3 months.  She will have a visit 1-2 days after the CT scan to discuss the results.  If she has stable disease, we will consider continued observation.  If she has evidence of disease progression, we will consider immunotherapy.  For her peripheral neuropathy, she will continue on gabapentin.  She will increase the dose to 200 mg 3 times a day.  A new prescription was sent to her pharmacy today.  The patient was advised to call immediately if she has any concerning symptoms in the interval. The patient voices understanding of current disease status and treatment options and is in agreement with the current care plan. All questions were answered. The patient knows to call the clinic with any problems, questions or concerns. We can certainly see the patient much  sooner if necessary.  Orders Placed This Encounter  Procedures  . CT CHEST W CONTRAST    Standing Status:   Future    Standing  Expiration Date:   08/19/2018    Order Specific Question:   If indicated for the ordered procedure, I authorize the administration of contrast media per Radiology protocol    Answer:   Yes    Order Specific Question:   Preferred imaging location?    Answer:   [facility]    Order Specific Question:   Radiology Contrast Protocol - do NOT remove file path    Answer:   [email or web address]    Order Specific Question:   Reason for Exam additional comments    Answer:   Stage IV lung cancer. Restaging.  Marland Kitchen CT ABDOMEN PELVIS W CONTRAST    Standing Status:   Future    Standing Expiration Date:   08/19/2018    Order Specific Question:   If indicated for the ordered procedure, I authorize the administration of contrast media per Radiology protocol    Answer:   Yes    Order Specific Question:   Preferred imaging location?    Answer:   [facility]    Order Specific Question:   Radiology Contrast Protocol - do NOT remove file path    Answer:   [email or web address]    Order Specific Question:   Reason for Exam additional comments    Answer:   Stage IV lung cancer. Restaging.  Marland Kitchen CBC with Differential (Cancer Center Only)    Standing Status:   Future    Standing Expiration Date:   08/19/2018  . CMP ([place] only)    Standing Status:   Future    Standing Expiration Date:   08/19/2018     Mikey Bussing, DNP, AGPCNP-BC, AOCNP 08/19/17   ADDENDUM: Hematology/Oncology Attending: I had a face-to-face encounter with the patient today.  I recommended her care plan.  This is a very pleasant 77 years old white female recently diagnosed with stage IV non-small cell lung cancer, squamous cell carcinoma status post palliative radiotherapy to the left upper lobe lung mass and mediastinum.  The patient was a started on systemic chemotherapy with carboplatin and paclitaxel status post 6 cycles and tolerated this treatment fairly well except for  fatigue and peripheral neuropathy. She had repeat CT scan of the chest performed recently.  I personally and independently reviewed the scans and discussed the results with the patient and her husband. Her scan showed no concerning findings for disease progression. I recommended for the patient to continue on observation for now with repeat CT scan of the chest, abdomen and pelvis in 3 months for restaging of her disease. For the peripheral neuropathy, I recommended for the patient to increase her dose of Neurontin to 200 mg p.o. 3 times daily. She was advised to call immediately if she has any concerning symptoms in the interval.  Disclaimer: This note was dictated with voice recognition software. Similar sounding words can inadvertently be transcribed and may be missed upon review. Eilleen Kempf, MD 08/19/17

## 2017-08-19 NOTE — Assessment & Plan Note (Signed)
This is a very pleasant 77 year old white female with stage IV (T2b, N3, M1 C) non-small cell lung cancer, squamous cell carcinoma presented with large left upper lobe lung mass in addition to mediastinal and supraclavicular lymphadenopathy as well as metastatic brain lesions diagnosed in September 2018. The patient underwent a course of palliative radiotherapy to the left upper lobe lung mass as well as the metastatic brain lesions. She is currently undergoing palliative systemic chemotherapy with carboplatin for AC of 5 and paclitaxel 175 mg/M2 every 3 weeks, status post 6 cycles. She continues to tolerate this treatment well except for the peripheral neuropathy. She had a recent restaging CT scan of the chest, abdomen, pelvis and is here to discuss the results.  The patient was seen with Dr. Julien Nordmann.  CT scan results were discussed with the patient and her husband which showed no evidence of disease progression.  We discussed giving her a break from chemotherapy.  The plan is to obtain restaging CT scans of the chest, abdomen, pelvis in approximately 3 months.  She will have a visit 1-2 days after the CT scan to discuss the results.  If she has stable disease, we will consider continued observation.  If she has evidence of disease progression, we will consider immunotherapy.  For her peripheral neuropathy, she will continue on gabapentin.  She will increase the dose to 200 mg 3 times a day.  A new prescription was sent to her pharmacy today.  The patient was advised to call immediately if she has any concerning symptoms in the interval. The patient voices understanding of current disease status and treatment options and is in agreement with the current care plan. All questions were answered. The patient knows to call the clinic with any problems, questions or concerns. We can certainly see the patient much sooner if necessary.

## 2017-08-19 NOTE — Telephone Encounter (Signed)
Printed avs and calender for upcoming appointment. Per 2/13 los

## 2017-08-21 ENCOUNTER — Telehealth: Payer: Self-pay | Admitting: Pediatrics

## 2017-08-21 NOTE — Telephone Encounter (Signed)
Letter sent with appointment date/time 

## 2017-08-26 ENCOUNTER — Other Ambulatory Visit: Payer: Medicare Other

## 2017-08-31 ENCOUNTER — Ambulatory Visit (INDEPENDENT_AMBULATORY_CARE_PROVIDER_SITE_OTHER): Payer: Medicare Other

## 2017-08-31 ENCOUNTER — Other Ambulatory Visit: Payer: Self-pay | Admitting: Pediatrics

## 2017-08-31 DIAGNOSIS — M810 Age-related osteoporosis without current pathological fracture: Secondary | ICD-10-CM | POA: Diagnosis not present

## 2017-08-31 DIAGNOSIS — Z780 Asymptomatic menopausal state: Secondary | ICD-10-CM

## 2017-09-01 ENCOUNTER — Other Ambulatory Visit: Payer: Medicare Other

## 2017-09-02 ENCOUNTER — Other Ambulatory Visit: Payer: Medicare Other

## 2017-09-09 ENCOUNTER — Other Ambulatory Visit: Payer: Medicare Other

## 2017-09-09 ENCOUNTER — Ambulatory Visit: Payer: Medicare Other | Admitting: Pediatrics

## 2017-09-09 ENCOUNTER — Ambulatory Visit: Payer: Medicare Other

## 2017-09-09 ENCOUNTER — Ambulatory Visit: Payer: Medicare Other | Admitting: Oncology

## 2017-09-28 ENCOUNTER — Encounter: Payer: Self-pay | Admitting: *Deleted

## 2017-09-28 ENCOUNTER — Ambulatory Visit (INDEPENDENT_AMBULATORY_CARE_PROVIDER_SITE_OTHER): Payer: Medicare Other | Admitting: Pediatrics

## 2017-09-28 ENCOUNTER — Encounter: Payer: Self-pay | Admitting: Pediatrics

## 2017-09-28 VITALS — BP 123/76 | HR 99 | Temp 97.3°F | Ht 61.0 in | Wt 132.0 lb

## 2017-09-28 DIAGNOSIS — R062 Wheezing: Secondary | ICD-10-CM

## 2017-09-28 DIAGNOSIS — G629 Polyneuropathy, unspecified: Secondary | ICD-10-CM

## 2017-09-28 DIAGNOSIS — J449 Chronic obstructive pulmonary disease, unspecified: Secondary | ICD-10-CM

## 2017-09-28 DIAGNOSIS — R531 Weakness: Secondary | ICD-10-CM

## 2017-09-28 MED ORDER — ALBUTEROL SULFATE 108 (90 BASE) MCG/ACT IN AEPB: 2.0000 | INHALATION_SPRAY | Freq: Four times a day (QID) | RESPIRATORY_TRACT | 0 refills | Status: AC | PRN

## 2017-09-28 MED ORDER — ALBUTEROL SULFATE (2.5 MG/3ML) 0.083% IN NEBU: 2.5000 mg | INHALATION_SOLUTION | Freq: Four times a day (QID) | RESPIRATORY_TRACT | 1 refills | Status: DC | PRN

## 2017-09-28 MED ORDER — GABAPENTIN 400 MG PO CAPS: 400.0000 mg | ORAL_CAPSULE | Freq: Three times a day (TID) | ORAL | 1 refills | Status: DC

## 2017-09-28 MED ORDER — BUDESONIDE-FORMOTEROL FUMARATE 160-4.5 MCG/ACT IN AERO: 1.0000 | INHALATION_SPRAY | Freq: Two times a day (BID) | RESPIRATORY_TRACT | 3 refills | Status: DC

## 2017-09-28 NOTE — Progress Notes (Signed)
  Subjective:   Patient ID: Jacqueline Kidd, female    DOB: Nov 03, 1940, 77 y.o.   MRN: [id number] CC: Numbness (Hands and feet)  HPI: [name] is a 77 y.o. female presenting for Numbness (Hands and feet)  Numbness/tingling: Ongoing since chemotherapy. Feels like sandpaper in her feet. Hands bother her when she uses them, such as wringing towels. She isnt sure if gabapentin has helped or not. Has capsacin cream at home, has not tried.   Feeling weak. Sometimes knees give out from under her. Husband helps her when walking distances. Feels "trembly" at times. Ongoing for weeks.   Had esophagitis with radiation, pt says eating was painful for weeks, now resolved and has been able to get more calories in.   Coughing off and on at times. Sometimes feels like her "bronchial tubes are closing up" can come on with walking. Sometimes coughing fits.  Relevant past medical, surgical, family and social history reviewed. Allergies and medications reviewed and updated.  ROS: Per HPI   Objective:    BP 123/76   Pulse 99   Temp (!) 97.3 F (36.3 C) (Oral)   Ht 5\' 1"  (1.549 m)   Wt 132 lb (59.9 kg)   BMI 24.94 kg/m   Wt Readings from Last 3 Encounters:  09/28/17 132 lb (59.9 kg)  08/19/17 139 lb 2 oz (63.1 kg)  07/29/17 138 lb 8 oz (62.8 kg)    Gen: NAD, alert EYES: EOMI, no conjunctival injection, or no icterus ENT: OP without erythema LYMPH: no cervical LAD CV: NRRR, normal S1/S2 Resp: wheezing b/l with forced exhalation, no crackles, normal WOB Abd: +BS, soft, NTND. Ext: No edema, warm Neuro: Alert and oriented MSK: normal muscle bulk  Assessment & Plan:  [place] was seen today for numbness.  Diagnoses and all orders for this visit:  Wheezing -     Albuterol Sulfate 108 (90 Base) MCG/ACT AEPB; Inhale 2 puffs into the lungs every 6 (six) hours as needed.  Neuropathy Increase to 300mg  TID gabapentin for 1 week. Any worsening in symptoms go back to 200mg  TID. After one week can  increase to 400mg  TID.   Weakness Legs giving out at times. Interested in getting stronger. Feels like she should have regained more of her strength. Open to physical therapy. -     Ambulatory referral to Physical Therapy  Chronic obstructive pulmonary disease, unspecified COPD type ([place]) -     budesonide-formoterol (SYMBICORT) 160-4.5 MCG/ACT inhaler; Inhale 1 puff into the lungs 2 (two) times daily.   Follow up plan: Return in about 3 weeks (around 10/19/2017). Assunta Found, MD [place]

## 2017-10-07 NOTE — Progress Notes (Signed)
Jacqueline Kidd  77 y.o. female with probable stage III nsclc of the right upper lung with right neck nodal disease and 2 brain metastases completed radiation 05-06-17, MRI brain w wo contrast 04-+04-19, FU.     Weight changes, if any: Wt Readings from Last 3 Encounters:  10/13/17 131 lb 3.2 oz (59.5 kg)  09/28/17 132 lb (59.9 kg)  08/19/17 139 lb 2 oz (63.1 kg)   Respiratory complaints, if any: Denies SOB,coughing clear secretion occasional wheezing Hemoptysis, if any:  No Swallowing Problems/Pain/Difficulty swallowing: Appetite: Good Pain:Hands and feet 8/10 taking neurontin Headache:No Dizziness:No Nausea/vomiting:No Ringing in ears:Yes for years ion both ears Visual changes (Blurred/ diplopia double vision,blind spots, and peripheral vsion changes):No visual problems Fatigue:yes all of the time thinks it maybe related to some of her medication. Cognitive changes:Feels she is forgetful remembering names BP (!) 133/56 (BP Location: Left Arm, Patient Position: Sitting, Cuff Size: Normal)   Pulse 95   Temp 97.8 F (36.6 C) (Oral)   Resp 20   Ht 5\' 1"  (1.549 m)   Wt 131 lb 3.2 oz (59.5 kg)   SpO2 94%   BMI 24.79 kg/m

## 2017-10-08 ENCOUNTER — Ambulatory Visit
Admission: RE | Admit: 2017-10-08 | Discharge: 2017-10-08 | Disposition: A | Discharge status: Home / Self Care | Payer: Medicare Other | Source: Ambulatory Visit | Attending: Radiation Oncology | Admitting: Radiation Oncology

## 2017-10-08 ENCOUNTER — Other Ambulatory Visit: Payer: Medicare Other

## 2017-10-08 DIAGNOSIS — C7931 Secondary malignant neoplasm of brain: Secondary | ICD-10-CM

## 2017-10-08 DIAGNOSIS — G9389 Other specified disorders of brain: Secondary | ICD-10-CM | POA: Diagnosis not present

## 2017-10-08 DIAGNOSIS — C7949 Secondary malignant neoplasm of other parts of nervous system: Principal | ICD-10-CM

## 2017-10-08 MED ORDER — GADOBENATE DIMEGLUMINE 529 MG/ML IV SOLN
12.0000 mL | Freq: Once | INTRAVENOUS | Status: AC | PRN
  Administered 2017-10-08: 12 mL via INTRAVENOUS

## 2017-10-08 NOTE — Progress Notes (Signed)
Manuela Schwartz, please set-up [facility]

## 2017-10-12 ENCOUNTER — Other Ambulatory Visit: Payer: Self-pay

## 2017-10-12 ENCOUNTER — Encounter: Payer: Self-pay | Admitting: Physical Therapy

## 2017-10-12 ENCOUNTER — Ambulatory Visit: Payer: Medicare Other | Attending: Pediatrics | Admitting: Physical Therapy

## 2017-10-12 VITALS — HR 100

## 2017-10-12 DIAGNOSIS — R2681 Unsteadiness on feet: Secondary | ICD-10-CM | POA: Insufficient documentation

## 2017-10-12 DIAGNOSIS — M6281 Muscle weakness (generalized): Secondary | ICD-10-CM | POA: Diagnosis not present

## 2017-10-12 NOTE — Therapy (Signed)
[facility] [place], [place], [address] Phone: [phone]   Fax:  [phone]  Physical Therapy Evaluation  Patient Details  Name: Jacqueline Kidd MRN: [id number] Date of Birth: 07/26/1940 Referring Provider: Assunta Found MD   Encounter Date: 10/12/2017  PT End of Session - 10/12/17 1316    Visit Number  1    Number of Visits  16    Date for PT Re-Evaluation  01/10/18    PT Start Time  4627    PT Stop Time  1319    PT Time Calculation (min)  24 min    Activity Tolerance  Patient tolerated treatment well    Behavior During Therapy  [facility] for tasks assessed/performed       Past Medical History:  Diagnosis Date  . Chronic low back pain   . COPD (chronic obstructive pulmonary disease) ([place])   . GERD (gastroesophageal reflux disease)   . Hyperlipidemia   . Hypertension   . Lesion of left lung   . lung ca dx'd 01/2017  . Osteoporosis     Past Surgical History:  Procedure Laterality Date  . ABDOMINAL HYSTERECTOMY    . TOTAL HIP ARTHROPLASTY     right    Vitals:   10/12/17 1342  Pulse: 100  SpO2: 96%     Subjective Assessment - 10/12/17 1319    Subjective  The patient presents to OPPT with c/o LE weakness and a feeling that her knees may give way.  She reports she went through a regimen of chemo and radiation for lung cancer.  She has also been on Gabapentin due to neuropathy but does not feel it is working.  She would like to get stronger.    Pertinent History  Chronic back pain; OP; right shoulder injury due to fall in hospital.  COPD. Right THA.    Limitations  Walking    How long can you walk comfortably?  Short distances with HHA today.         [facility] PT Assessment - 10/12/17 0001      Assessment   Medical Diagnosis  LE weakness.    Referring Provider  Assunta Found MD    Onset Date/Surgical Date  -- Several months.      Precautions   Precautions  Fall      Restrictions   Weight Bearing  Restrictions  No      Balance Screen   Has the patient fallen in the past 6 months  Yes    How many times?  -- 2.  "I broke my right arm."    Has the patient had a decrease in activity level because of a fear of falling?   Yes    Is the patient reluctant to leave their home because of a fear of falling?   No      Home Environment   Living Environment  Private residence      Prior Function   Level of Independence  Requires assistive device for independence;Needs assistance with gait      Cognition   Overall Cognitive Status  Within Functional Limits for tasks assessed      Sensation   Additional Comments  C/o numbness over hands and feet.      Coordination   Gross Motor Movements are Fluid and Coordinated  -- WNL.      Posture/Postural Control   Posture/Postural Control  Postural limitations    Postural Limitations  Rounded Shoulders;Forward head;Increased thoracic kyphosis  ROM / Strength   AROM / PROM / Strength  AROM;Strength      AROM   Overall AROM Comments  WNL for bilateral LE's (Right THA).      Strength   Overall Strength Comments  Bilateral hip flex/abd= 4-/5; bilateral knee flex/ext= 4/5 and bilateral ankle strength= 4+/5.      Special Tests   Other special tests  (+) Romberg test.      Transfers   Comments  Supervision only for sit to supine to sit.      Ambulation/Gait   Gait Pattern  Ataxic    Gait Comments  Recommend assisitve devcie at all times and at least HHA as patient is at high risk for falls.      Standardized Balance Assessment   Standardized Balance Assessment  Berg Balance Test      Berg Balance Test   Sit to Stand  Able to stand  independently using hands    Standing Unsupported  Able to stand 30 seconds unsupported    Sitting with Back Unsupported but Feet Supported on Floor or Stool  Able to sit safely and securely 2 minutes    Stand to Sit  Sits safely with minimal use of hands    Transfers  Able to transfer safely, minor use of  hands    Standing Unsupported with Eyes Closed  Needs help to keep from falling    Standing Ubsupported with Feet Together  Needs help to attain position and unable to hold for 15 seconds    From Standing, Reach Forward with Outstretched Arm  Can reach forward >12 cm safely (5")    From Standing Position, Pick up Object from Floor  Unable to pick up and needs supervision    From Standing Position, Turn to Look Behind Over each Shoulder  Turn sideways only but maintains balance    Turn 360 Degrees  Able to turn 360 degrees safely but slowly    Standing Unsupported, Alternately Place Feet on Step/Stool  Able to complete >2 steps/needs minimal assist    Standing Unsupported, One Foot in Front  Needs help to step but can hold 15 seconds    Standing on One Leg  Tries to lift leg/unable to hold 3 seconds but remains standing independently    Total Score  28                Objective measurements completed on examination: See above findings.                PT Short Term Goals - 10/12/17 1349      PT SHORT TERM GOAL #1   Title  STG's=LTG's        PT Long Term Goals - 10/12/17 1349      PT LONG TERM GOAL #1   Title  Ind with a HEP.    Time  8    Period  Weeks    Status  New      PT LONG TERM GOAL #2   Title  Improve bilateral LE to 5/5 to increase stability for functional tasks.    Time  8    Period  Weeks    Status  New      PT LONG TERM GOAL #3   Title  (-) Romberg test.    Time  8    Period  Weeks    Status  New      PT LONG TERM GOAL #4   Title  Improve Berg score to 44/56.    Time  8    Period  Weeks    Status  New             Plan - 10/12/17 1338    Clinical Impression Statement  The patient c/o LE weakness.  She has been through a regimen of chemo and radiation due to lung cancer.  She exhibits a positive Romberg test and a Berg balance score of 28/56.  She has hip and knee weakness and neuropathy in hands and feet.  She is unstaedy on her  feet with an ataxic gait and required HHA for safe ambulation at all times today. The patient's deficits have impaired her fucnctional mobility.  Patient will benefit from skilled physical therapy with guidance into a balance program and LE strengthening exercises.    History and Personal Factors relevant to plan of care:  Chronic back pain; OP; right shoulder injury due to fall in hospital.  COPD. Right THA.    Clinical Presentation  Evolving    Clinical Decision Making  Moderate    Rehab Potential  Good    PT Frequency  2x / week    PT Duration  8 weeks    PT Treatment/Interventions  ADLs/Self Care Home Management;Gait training;Functional mobility training;Therapeutic activities;Therapeutic exercise;Balance training;Neuromuscular re-education;Patient/family education    PT Next Visit Plan  Please monitor 02 and HR; Nustep; balance and gait activites.  LE strengthening exercises.    Consulted and Agree with Plan of Care  Patient       Patient will benefit from skilled therapeutic intervention in order to improve the following deficits and impairments:  Abnormal gait, Cardiopulmonary status limiting activity, Decreased activity tolerance, Decreased balance, Decreased endurance, Decreased coordination, Decreased mobility, Decreased strength, Postural dysfunction  Visit Diagnosis: Unsteadiness on feet - Plan: PT plan of care cert/re-cert  Muscle weakness (generalized) - Plan: PT plan of care cert/re-cert     Problem List Patient Active Problem List   Diagnosis Date Noted  . Need for influenza vaccination 04/15/2017  . Brain metastases ([place]) 03/30/2017  . Goals of care, counseling/discussion 03/23/2017  . Encounter for antineoplastic chemotherapy 03/23/2017  . Mass of parotid gland May 18, 202018  . Primary cancer of left upper lobe of lung ([place]) 02/19/2017  . Lung mass 02/16/2017  . BMI 33.0-33.9,adult 03/21/2016  . Peripheral edema 03/21/2016  . Osteoporosis 06/21/2014  . Hypertension  01/21/2013    Seanmichael Salmons, [place] MPT 10/12/2017, 1:52 PM  [facility] [address] [place], [place], [address] Phone: [phone]   Fax:  [phone]  Name: Jacqueline Kidd MRN: [id number] Date of Birth: 07-10-1940

## 2017-10-13 ENCOUNTER — Ambulatory Visit
Admission: RE | Admit: 2017-10-13 | Discharge: 2017-10-13 | Disposition: A | Discharge status: Home / Self Care | Payer: Medicare Other | Source: Ambulatory Visit | Attending: Radiation Oncology | Admitting: Radiation Oncology

## 2017-10-13 ENCOUNTER — Ambulatory Visit
Admission: RE | Admit: 2017-10-13 | Discharge: 2017-10-13 | Disposition: A | Discharge status: Home / Self Care | Payer: Medicare Other | Source: Ambulatory Visit | Attending: Urology | Admitting: Urology

## 2017-10-13 ENCOUNTER — Other Ambulatory Visit: Payer: Self-pay

## 2017-10-13 ENCOUNTER — Encounter: Payer: Self-pay | Admitting: Urology

## 2017-10-13 VITALS — BP 133/56 | HR 95 | Temp 97.8°F | Resp 20 | Ht 61.0 in | Wt 131.2 lb

## 2017-10-13 DIAGNOSIS — Z510 Encounter for antineoplastic radiation therapy: Secondary | ICD-10-CM | POA: Insufficient documentation

## 2017-10-13 DIAGNOSIS — Z08 Encounter for follow-up examination after completed treatment for malignant neoplasm: Secondary | ICD-10-CM | POA: Diagnosis not present

## 2017-10-13 DIAGNOSIS — C3412 Malignant neoplasm of upper lobe, left bronchus or lung: Secondary | ICD-10-CM | POA: Insufficient documentation

## 2017-10-13 DIAGNOSIS — C771 Secondary and unspecified malignant neoplasm of intrathoracic lymph nodes: Secondary | ICD-10-CM | POA: Diagnosis not present

## 2017-10-13 DIAGNOSIS — Z7982 Long term (current) use of aspirin: Secondary | ICD-10-CM | POA: Diagnosis not present

## 2017-10-13 DIAGNOSIS — Z923 Personal history of irradiation: Secondary | ICD-10-CM | POA: Insufficient documentation

## 2017-10-13 DIAGNOSIS — Z888 Allergy status to other drugs, medicaments and biological substances status: Secondary | ICD-10-CM | POA: Diagnosis not present

## 2017-10-13 DIAGNOSIS — Z79899 Other long term (current) drug therapy: Secondary | ICD-10-CM | POA: Diagnosis not present

## 2017-10-13 DIAGNOSIS — C7931 Secondary malignant neoplasm of brain: Secondary | ICD-10-CM | POA: Insufficient documentation

## 2017-10-13 DIAGNOSIS — G629 Polyneuropathy, unspecified: Secondary | ICD-10-CM | POA: Insufficient documentation

## 2017-10-13 DIAGNOSIS — S42201D Unspecified fracture of upper end of right humerus, subsequent encounter for fracture with routine healing: Secondary | ICD-10-CM | POA: Diagnosis not present

## 2017-10-13 DIAGNOSIS — Z885 Allergy status to narcotic agent status: Secondary | ICD-10-CM | POA: Insufficient documentation

## 2017-10-13 DIAGNOSIS — W19XXXD Unspecified fall, subsequent encounter: Secondary | ICD-10-CM | POA: Insufficient documentation

## 2017-10-13 MED ORDER — LORAZEPAM 0.5 MG PO TABS: 0.5000 mg | ORAL_TABLET | ORAL | 0 refills | Status: DC | PRN

## 2017-10-13 MED ORDER — SODIUM CHLORIDE 0.9% FLUSH
10.0000 mL | Freq: Once | INTRAVENOUS | Status: AC
  Administered 2017-10-13: 10 mL via INTRAVENOUS

## 2017-10-13 NOTE — Progress Notes (Addendum)
Radiation Oncology         [phone] ________________________________  Name: Jacqueline Kidd MRN: [id number]  Date: 10/13/2017  DOB: 05/26/1941  Post Treatment Note  CC: [name], MD  [name], MD  Diagnosis:   77 y.o. female with probable stage IV NSCLC of the left upper lung with neck and mediastinal nodal disease and 2 brain metastases.   Interval Since Last Radiation:  4 weeks  1.  Chest radiation 03/23/2017 - 05/06/2017- The primary tumor and involved mediastinal adenopathy were treated to 66 Gy in 33 fractions of 2 Gy.  2.  SRS on 04/03/17: Left cerebellar 12 mm target and Left insular 7 mm target were treated to a prescription dose of 20 Gy.  Narrative:  The patient returns today for routine follow-up and to review findings from recent surveillance brain MRI from 10/08/17.  Her scan was reviewed at multidisciplinary brain conference 10/12/17 and unfortunately demonstrates 3 new lesions.  There is a 4 mm lesion in the right inferior temporal lobe, a 2 mm lesion in the right occipital lobe and a 4 mm lesion in the left temporal lobe all without significant mass-effect or surrounding edema.  There is also noted interval increase in size of the previously treated left insular lesion now measuring 5 mm in diameter, previously measured 67mm 07/2017, felt to most likely represent post treatment effect.                     On review of systems, the patient states that she has been doing fairly well overall. She has completed 6 cycles of palliative systemic chemotherapy with carboplatin and paclitaxel every 3 weeks; first dose on 04/15/2017.  She has had progressively worsening neuropathy in her hands and now in the lower extremities as well.  Recent restaging scans 08/12/17 showed no evidence of disease progression.  At her last visit with Dr. Julien Nordmann on August 19, 2017 the decision was to take a break from systemic chemotherapy and reimage in 3 months.  If her disease remains stable she will  continue in observation only.  However, if there is evidence for disease progression consideration for palliative immunotherapy will be given at that time.  She denies recent fevers, chills, chest pain, increased shortness of breath, cough or hemoptysis.  Esophagitis has resolved and she has been able to resume a normal diet. She continues with a decreased appetite and has lost a few pounds in the interim. She has noticed some numbness and tingling in her fingers/hands bilaterally which she associates with chemotherapy induced neuropathy.  She is taking neurontin with some improvement in sxs.  She denies headaches, visual or auditory changes, tinnitus, dizziness, tremors, seizure activity or imbalance.   Unfortunately, she fell while getting on the elevator on her way to her appointment here in our clinic back in January and broke her right proximal humerus. The fracture has healed as per her orthopedist but she continues with pain in the right shoulder and limited ROM.   ALLERGIES:  is allergic to fosamax [alendronate]; hydrocodone-acetaminophen; and lisinopril-hydrochlorothiazide.  Meds: Current Outpatient Medications  Medication Sig Dispense Refill  . acetaminophen (TYLENOL) 325 MG tablet Take 650 mg by mouth every 6 (six) hours as needed for mild pain or moderate pain.     . Albuterol Sulfate 108 (90 Base) MCG/ACT AEPB Inhale 2 puffs into the lungs every 6 (six) hours as needed. 1 each 0  . aspirin EC 81 MG tablet Take 81 mg  by mouth daily.    Marland Kitchen gabapentin (NEURONTIN) 400 MG capsule Take 1 capsule (400 mg total) by mouth 3 (three) times daily. 90 capsule 1  . amLODipine (NORVASC) 2.5 MG tablet Take 1 tablet (2.5 mg total) by mouth daily. (Patient not taking: Reported on 10/13/2017) 90 tablet 1  . budesonide-formoterol (SYMBICORT) 160-4.5 MCG/ACT inhaler Inhale 1 puff into the lungs 2 (two) times daily. (Patient not taking: Reported on 10/13/2017) 1 Inhaler 3  . cholecalciferol (VITAMIN D) 1000 units  tablet Take 1,000 Units by mouth daily.    Marland Kitchen LORazepam (ATIVAN) 0.5 MG tablet Take 1 tablet (0.5 mg total) by mouth as needed for anxiety (take one tablet 30 minutes prior to MRI scans). (Patient not taking: Reported on 10/13/2017) 10 tablet 0  . prochlorperazine (COMPAZINE) 10 MG tablet Take 1 tablet (10 mg total) by mouth every 6 (six) hours as needed for nausea or vomiting. (Patient not taking: Reported on 09/28/2017) 30 tablet 1   No current facility-administered medications for this encounter.     Physical Findings:  height is 5\' 1"  (1.549 m) and weight is 131 lb 3.2 oz (59.5 kg). Her oral temperature is 97.8 F (36.6 C). Her blood pressure is 133/56 (abnormal) and her pulse is 95. Her respiration is 20 and oxygen saturation is 94%.  Pain Assessment Pain Score: 8  Pain Loc: Hand(hands feet)/10 In general this is a well appearing caucasian female in no acute distress. She's alert and oriented x4 and appropriate throughout the examination. Cardiopulmonary assessment is negative for acute distress and she exhibits normal effort. She appears grossly neurologically intact. EOMs are intact bilaterally and PERRLA.  Strength is 5 out of 5 and equal bilaterally in the upper and lower extremities.  Sensation is intact to light touch in the upper and lower extremities.  Lab Findings: Lab Results  Component Value Date   WBC 3.6 (L) 08/19/2017   HGB 9.8 (L) 08/19/2017   HCT 29.5 (L) 08/19/2017   MCV 109.5 (H) 08/19/2017   PLT 113 (L) 08/19/2017     Radiographic Findings: Mr [name] SE Contrast  Result Date: 10/08/2017 CLINICAL DATA:  History of non-small-cell lung cancer, 7 month follow-up SRS. Left cerebellar 12 mm target was treated to a prescription dose of 20 Gy. Left insular 7 mm target was treated to a prescription dose of 20 Gy. EXAM: MRI HEAD WITHOUT AND WITH CONTRAST TECHNIQUE: Multiplanar, multiecho pulse sequences of the brain and surrounding structures were obtained without and with  intravenous contrast. CONTRAST:  22mL MULTIHANCE GADOBENATE DIMEGLUMINE 529 MG/ML IV SOLN Creatinine was obtained on site at [facility] at [address] Results: Creatinine 0.5 mg/dL. COMPARISON:  07/10/2017. FINDINGS: Brain: Previous small area of RIGHT centrum semiovale restricted diffusion has resolved, and likely represented an acute/subacute incidental infarct. Treated lesion LEFT cerebellum continues to regress, now 3 mm cross-section versus 4 on the most recent prior. Treated lesion LEFT insula is now increased in size, 5 mm diameter, with mild restriction. This could represent recurrent tumor or post treatment effect. Three new lesions are seen. There is a 4 mm lesion in the RIGHT inferior temporal lobe, subcortical white matter, series 10, image 50. A 2 mm lesion is seen in the RIGHT occipital lobe, periventricular white matter, series 10, image 61. A third lesion involves the LEFT temporal lobe, superior temporal gyrus, 4 mm, series 10, image 68. None have significant mass effect or surrounding edema. Generalized atrophy with hydrocephalus ex vacuo. Extensive periventricular white matter  signal abnormality, representing small vessel disease. Vascular: Normal flow voids. Skull and upper cervical spine: Normal marrow signal. Sinuses/Orbits: Negative sinuses.  BILATERAL cataract extraction. Other: Unchanged parotid lesions. IMPRESSION: Three new subcentimeter metastases are identified in the brain. See discussion above. Interval growth of the treated lesion in the LEFT insula could represent recurrent tumor or post treatment effect. Continued involution of the treated LEFT cerebellar metastasis. Resolved RIGHT centrum semiovale infarct. Electronically Signed   By: Staci Righter M.D.   On: 10/08/2017 13:25    Impression/Plan: 1. 77 y.o. female with stage IV (T2b, N3, M1 C) NSCLC, squamous cell carcinoma  of the left upper lung with neck and mediastinal nodal disease and brain metastases.     Today, I talked to the patient and her husband about the findings and workup thus far. Recent brain MRI from 10/08/2017 demonstrates 3 new lesions- a 4 mm right inferior temporal lobe lesion, a 2 mm right occipital lobe lesion and a 4 mm left temporal lobe lesion, all without significant mass-effect or surrounding edema.   We discussed the natural history of metastatic non-small cell lung cancer with metastases to the brain and general treatment, highlighting the role of radiotherapy in the management.  Consensus recommendation at recent multidisciplinary brain conference was to proceed with stereotactic radiosurgery [facility]) treatment of the 3 new lesions.  We discussed the available radiation techniques, and focused on the details of logistics and delivery.  She tolerated her prior SRS treatments very well.  The recommendation is to proceed with SRS treatment to the 3 new lesions delivered in a single fraction.  We reviewed the anticipated acute and late sequelae associated with radiation in this setting. The patient was encouraged to ask questions that were answered to her satisfaction.  At the conclusion of our conversation, the patient elects to proceed with stereotactic radiosurgery to the 3 new lesions in the brain.  She has freely signed written consent today in the office and a copy of this document is placed in her medical record.  We will proceed with CT simulation following our visit today in anticipation of her [place] treatment scheduled for 10/22/2017.  A Rx was sent to her pharmacy for Ativan 0.5mg  to be used 30 minutes prior to her [place] treatment which has helped greatly in the past. 2. Right proximal humerus fracture. She will continue in follow up with her orthopedist for consideration of PT to assist with regaining strength and ROM in the RUE.    [name], PA-C

## 2017-10-13 NOTE — Progress Notes (Signed)
Has armband been applied?  yes  Does patient have an allergy to IV contrast dye?: No   Has patient ever received premedication for IV contrast dye?: No  Does patient take metformin?: No  If patient does take metformin when was the last dose: No  Date of lab work: 08/19/2017 BUN: 5 CR: 0.64 eGFR: >60 IV site: Left AC  Has IV site been added to flowsheet?  Yes  There were no vitals taken for this visit.

## 2017-10-16 DIAGNOSIS — C3412 Malignant neoplasm of upper lobe, left bronchus or lung: Secondary | ICD-10-CM | POA: Diagnosis not present

## 2017-10-16 DIAGNOSIS — Z510 Encounter for antineoplastic radiation therapy: Secondary | ICD-10-CM | POA: Diagnosis not present

## 2017-10-16 DIAGNOSIS — C7931 Secondary malignant neoplasm of brain: Secondary | ICD-10-CM | POA: Diagnosis not present

## 2017-10-16 NOTE — Progress Notes (Signed)
  Radiation Oncology         [phone] ________________________________  Name: AVAH BASHOR MRN: [id number]  Date: 10/13/2017  DOB: 08/03/1940  SIMULATION AND TREATMENT PLANNING NOTE    ICD-10-CM   1. Primary cancer of left upper lobe of lung (HCC) C34.12 sodium chloride flush (NS) 0.9 % injection 10 mL  2. Brain metastases ([place]) C79.31     DIAGNOSIS:  77 yo woman with 3 subcentimeter brain mets from NSCLC of the left upper lung  NARRATIVE:  The patient was brought to the [facility].  Identity was confirmed.  All relevant records and images related to the planned course of therapy were reviewed.  The patient freely provided informed written consent to proceed with treatment after reviewing the details related to the planned course of therapy. The consent form was witnessed and verified by the simulation staff. Intravenous access was established for contrast administration. Then, the patient was set-up in a stable reproducible supine position for radiation therapy.  A relocatable thermoplastic stereotactic head frame was fabricated for precise immobilization.  CT images were obtained.  Surface markings were placed.  The CT images were loaded into the planning software and fused with the patient's targeting MRI scan.  Then the target and avoidance structures were contoured.  Treatment planning then occurred.  The radiation prescription was entered and confirmed.  I have requested 3D planning  I have requested a DVH of the following structures: Brain stem, brain, left eye, right eye, lenses, optic chiasm, target volumes, uninvolved brain, and normal tissue.    SPECIAL TREATMENT PROCEDURE:  The planned course of therapy using radiation constitutes a special treatment procedure. Special care is required in the management of this patient for the following reasons. This treatment constitutes a Special Treatment Procedure for the following reason: High dose per fraction requiring special  monitoring for increased toxicities of treatment including daily imaging.  The special nature of the planned course of radiotherapy will require increased physician supervision and oversight to ensure patient's safety with optimal treatment outcomes.  PLAN:  The patient will receive 20 Gy in 1 fraction to 3 targets.  ________________________________  Sheral Apley [name], M.D.

## 2017-10-19 ENCOUNTER — Ambulatory Visit: Payer: Medicare Other | Admitting: Physical Therapy

## 2017-10-19 DIAGNOSIS — R2681 Unsteadiness on feet: Secondary | ICD-10-CM

## 2017-10-19 DIAGNOSIS — M6281 Muscle weakness (generalized): Secondary | ICD-10-CM

## 2017-10-19 NOTE — Therapy (Addendum)
[facility] [place], [place], [address] Phone: [phone]   Fax:  [phone]  Physical Therapy Treatment PHYSICAL THERAPY DISCHARGE SUMMARY  Visits from Start of Care: 2  Current functional level related to goals / functional outcomes: See below   Remaining deficits: See goals   Education / Equipment: HEP Plan: Patient agrees to discharge.  Patient goals were not met. Patient is being discharged due to not returning since the last visit.  ?????  Gabriela Eves, PT, DPT 03/29/19    Patient Details  Name: Jacqueline Kidd MRN: [id number] Date of Birth: 1940-12-19 Referring Provider: Assunta Found MD   Encounter Date: 10/19/2017  PT End of Session - 10/19/17 1304    Visit Number  2    Number of Visits  16    Date for PT Re-Evaluation  01/10/18    PT Start Time  1300    PT Stop Time  1344    PT Time Calculation (min)  44 min    Activity Tolerance  Patient tolerated treatment well    Behavior During Therapy  [facility] for tasks assessed/performed       Past Medical History:  Diagnosis Date  . Chronic low back pain   . COPD (chronic obstructive pulmonary disease) ([place])   . GERD (gastroesophageal reflux disease)   . Hyperlipidemia   . Hypertension   . Lesion of left lung   . lung ca dx'd 01/2017  . Osteoporosis     Past Surgical History:  Procedure Laterality Date  . ABDOMINAL HYSTERECTOMY    . TOTAL HIP ARTHROPLASTY     right    There were no vitals filed for this visit.  Subjective Assessment - 10/19/17 1304    Subjective  Patient reported no new complaints, still feels weak. Patient reported she will recieve radiation treatment on Thursday.    Pertinent History  Chronic back pain; OP; right shoulder injury due to fall in hospital.  COPD. Right THA.    Limitations  Walking    How long can you walk comfortably?  Short distances with HHA today.    Currently in Pain?  No/denies         [facility] PT  Assessment - 10/19/17 0001      Assessment   Medical Diagnosis  LE weakness.      Precautions   Precautions  Fall      Restrictions   Weight Bearing Restrictions  No                   [facility] Adult PT Treatment/Exercise - 10/19/17 0001      Exercises   Exercises  Knee/Hip      Knee/Hip Exercises: Aerobic   Nustep  Level 2 x15 mins      Knee/Hip Exercises: Standing   Heel Raises  Both;15 reps    Forward Step Up  Both;10 reps;Hand Hold: 2;Step Height: 4"    Rocker Board  2 minutes      Knee/Hip Exercises: Seated   Long Arc Quad  AROM;Strengthening;Both;20 reps    Clamshell with TheraBand  Red x20    Marching  AROM;Both;2 sets;10 reps    Hamstring Curl  Strengthening;Both;20 reps    Hamstring Limitations  Red theraband               PT Short Term Goals - 10/12/17 1349      PT SHORT TERM GOAL #1   Title  STG's=LTG's  PT Long Term Goals - 10/12/17 1349      PT LONG TERM GOAL #1   Title  Ind with a HEP.    Time  8    Period  Weeks    Status  New      PT LONG TERM GOAL #2   Title  Improve bilateral LE to 5/5 to increase stability for functional tasks.    Time  8    Period  Weeks    Status  New      PT LONG TERM GOAL #3   Title  (-) Romberg test.    Time  8    Period  Weeks    Status  New      PT LONG TERM GOAL #4   Title  Improve Berg score to 44/56.    Time  8    Period  Weeks    Status  New            Plan - 10/19/17 1334    Clinical Impression Statement  Patient was able to complete exercises with minimal reports of fatigue. Patient's vitals assessed throughout session, SaO2 ranged from 91%-96%. Patient was provided with adequate rest breaks in order to stabilize SaO2. Patient noted with moderate fatigue at end of session. Patient educated chemotherapy and radiation can cause a lot of muscle fatigue and atrophy and can cause effects well after last treatment. Patient reported understanding. Continue to monitor SaO2.     Clinical Presentation  Evolving    Clinical Decision Making  Moderate    Rehab Potential  Good    PT Frequency  2x / week    PT Duration  8 weeks    PT Treatment/Interventions  ADLs/Self Care Home Management;Gait training;Functional mobility training;Therapeutic activities;Therapeutic exercise;Balance training;Neuromuscular re-education;Patient/family education    PT Next Visit Plan  Please monitor 02 and HR; Nustep; balance and gait activites.  LE strengthening exercises.    Consulted and Agree with Plan of Care  Patient       Patient will benefit from skilled therapeutic intervention in order to improve the following deficits and impairments:  Abnormal gait, Cardiopulmonary status limiting activity, Decreased activity tolerance, Decreased balance, Decreased endurance, Decreased coordination, Decreased mobility, Decreased strength, Postural dysfunction  Visit Diagnosis: Unsteadiness on feet  Muscle weakness (generalized)     Problem List Patient Active Problem List   Diagnosis Date Noted  . Need for influenza vaccination 04/15/2017  . Brain metastases ([place]) 03/30/2017  . Goals of care, counseling/discussion 03/23/2017  . Encounter for antineoplastic chemotherapy 03/23/2017  . Mass of parotid gland 09-25-2016  . Primary cancer of left upper lobe of lung ([place]) 02/19/2017  . Lung mass 02/16/2017  . BMI 33.0-33.9,adult 03/21/2016  . Peripheral edema 03/21/2016  . Osteoporosis 06/21/2014  . Hypertension 01/21/2013    Gabriela Eves, PT, DPT 10/19/2017, 4:42 PM  [facility] [address] [place], [place], [address] Phone: [phone]   Fax:  [phone]  Name: Jacqueline Kidd MRN: [id number] Date of Birth: 25-Jan-1941

## 2017-10-22 ENCOUNTER — Ambulatory Visit
Admission: RE | Admit: 2017-10-22 | Discharge: 2017-10-22 | Disposition: A | Discharge status: Home / Self Care | Payer: Medicare Other | Source: Ambulatory Visit | Attending: Radiation Oncology | Admitting: Radiation Oncology

## 2017-10-22 ENCOUNTER — Encounter: Payer: Self-pay | Admitting: Radiation Oncology

## 2017-10-22 ENCOUNTER — Other Ambulatory Visit: Payer: Self-pay | Admitting: *Deleted

## 2017-10-22 VITALS — BP 115/64 | HR 93 | Temp 98.2°F

## 2017-10-22 DIAGNOSIS — G629 Polyneuropathy, unspecified: Secondary | ICD-10-CM

## 2017-10-22 DIAGNOSIS — C3412 Malignant neoplasm of upper lobe, left bronchus or lung: Secondary | ICD-10-CM | POA: Diagnosis not present

## 2017-10-22 DIAGNOSIS — C3490 Malignant neoplasm of unspecified part of unspecified bronchus or lung: Secondary | ICD-10-CM | POA: Diagnosis not present

## 2017-10-22 DIAGNOSIS — C7931 Secondary malignant neoplasm of brain: Secondary | ICD-10-CM

## 2017-10-22 DIAGNOSIS — Z510 Encounter for antineoplastic radiation therapy: Secondary | ICD-10-CM | POA: Diagnosis not present

## 2017-10-22 MED ORDER — GABAPENTIN 400 MG PO CAPS: 400.0000 mg | ORAL_CAPSULE | Freq: Three times a day (TID) | ORAL | 0 refills | Status: DC

## 2017-10-22 NOTE — Op Note (Signed)
Stereotactic Radiosurgery Operative Note  Name: Jacqueline Kidd MRN: [id number]  Date: 10/22/2017  DOB: 11-24-1940  Op Note  Pre Operative Diagnosis:  Metastatic lung cancer with multiple brain metastases  Post Operative Diagnois:  Metastatic lung cancer with multiple brain metastases  3D TREATMENT PLANNING AND DOSIMETRY:  The patient's radiation plan was reviewed and approved by myself (neurosurgery) and Dr. Ledon Snare (radiation oncology) prior to treatment.  It showed 3-dimensional radiation distributions overlaid onto the planning CT/MRI image set.  The [facility] for the target structures as well as the organs at risk were reviewed. The documentation of the 3D plan and dosimetry are filed in the radiation oncology EMR.  NARRATIVE:  Jacqueline Kidd was brought to the TrueBeam stereotactic radiation treatment machine and placed supine on the CT couch. The head frame was applied, and the patient was set up for stereotactic radiosurgery.  I was present for the set-up and delivery.  SIMULATION VERIFICATION:  In the couch zero-angle position, the patient underwent Exactrac imaging using the Brainlab system with orthogonal KV images.  These were carefully aligned and repeated to confirm treatment position for each of the isocenters.  The Exactrac snap film verification was repeated at each couch angle.  SPECIAL TREATMENT PROCEDURE: Jacqueline Kidd received stereotactic radiosurgery using a multi-target, single isocenter technique to the following 3 targets: Right temporal (4 mm), left temporal (4 mm), and right occipital (2 mm) targets were treated using 6 Rapid Arc VMAT Beams to a prescription dose of 20 Gy for each lesion.  ExacTrac registration was performed for each couch angle.  The 100% isodose line was prescribed.  STEREOTACTIC TREATMENT MANAGEMENT:  Following delivery, the patient was transported to nursing in stable condition and monitored for possible acute effects.  Vital signs were recorded. The  patient tolerated treatment without significant acute effects, and was discharged to home in stable condition.    PLAN: Follow-up in one month.

## 2017-10-23 NOTE — Progress Notes (Signed)
  Radiation Oncology         [phone] ________________________________  Name: Jacqueline Kidd MRN: [id number]  Date: 10/22/2017  DOB: 1940-09-19  End of Treatment Note  Diagnosis:   77 y.o. female with 3 subcentimeter brain mets from NSCLC of the left upper lung    Indication for treatment:  Palliative       Radiation treatment dates:   10/22/2017  Site/dose:   Brain PTV3-5 // 20 Gy in 1 fraction 1. PTV3: Right Temporal 43mm 2. PTV4: Left Temporal 11mm 3. PTV5: Right Occipital 13mm  Beams/energy:   SBRT/SRT-3D // 6X-FFF Photon  Narrative: The patient tolerated radiation treatment relatively well.   No significant acute effects.  Plan: The patient has completed radiation treatment. The patient will return to radiation oncology clinic for routine followup in one month. I advised her to call or return sooner if she has any questions or concerns related to her recovery or treatment. ________________________________  Sheral Apley. [name], M.D.  This document serves as a record of services personally performed by Tyler Pita, MD. It was created on his behalf by Rae Lips, a trained medical scribe. The creation of this record is based on the scribe's personal observations and the provider's statements to them. This document has been checked and approved by the attending provider.

## 2017-10-25 NOTE — Progress Notes (Signed)
  Radiation Oncology         [phone] ________________________________  Stereotactic Treatment Procedure Note  Name: Jacqueline Kidd MRN: [id number]  Date: 10/22/2017  DOB: [birth year]  SPECIAL TREATMENT PROCEDURE    ICD-10-CM   1. Brain metastases ([place]) C79.31     3D TREATMENT PLANNING AND DOSIMETRY:  The patient's radiation plan was reviewed and approved by neurosurgery and radiation oncology prior to treatment.  It showed 3-dimensional radiation distributions overlaid onto the planning CT/MRI image set.  The [facility] for the target structures as well as the organs at risk were reviewed. The documentation of the 3D plan and dosimetry are filed in the radiation oncology EMR.  NARRATIVE:  Jacqueline Kidd was brought to the TrueBeam stereotactic radiation treatment machine and placed supine on the CT couch. The head frame was applied, and the patient was set up for stereotactic radiosurgery.  Neurosurgery was present for the set-up and delivery  SIMULATION VERIFICATION:  In the couch zero-angle position, the patient underwent Exactrac imaging using the Brainlab system with orthogonal KV images.  These were carefully aligned and repeated to confirm treatment position for each of the isocenters.  The Exactrac snap film verification was repeated at each couch angle.  PROCEDURE: Jacqueline Kidd received stereotactic radiosurgery to the following targets: Right temporal 4 mm target, Left Temporal 4 mm target and Right occipital 2 mm target were treated using 5 Rapid Arc VMAT Beams to a prescription dose of 20 Gy.  ExacTrac registration was performed for each couch angle.  The 100% isodose line was prescribed.  6 MV X-rays were delivered in the flattening filter free beam mode.  STEREOTACTIC TREATMENT MANAGEMENT:  Following delivery, the patient was transported to nursing in stable condition and monitored for possible acute effects.  Vital signs were recorded BP 115/64   Pulse 93   Temp 98.2 F (36.8 C)    SpO2 97% Comment: room air. The patient tolerated treatment without significant acute effects, and was discharged to home in stable condition.    PLAN: Follow-up in one month.  ________________________________  Sheral Apley. [name], M.D.

## 2017-10-26 ENCOUNTER — Encounter: Payer: Medicare Other | Admitting: Physical Therapy

## 2017-11-16 ENCOUNTER — Encounter (HOSPITAL_COMMUNITY): Payer: Self-pay

## 2017-11-16 ENCOUNTER — Ambulatory Visit (HOSPITAL_COMMUNITY)
Admission: RE | Admit: 2017-11-16 | Discharge: 2017-11-16 | Disposition: A | Discharge status: Home / Self Care | Payer: Medicare Other | Source: Ambulatory Visit | Attending: Oncology | Admitting: Oncology

## 2017-11-16 ENCOUNTER — Inpatient Hospital Stay: Payer: Medicare Other | Attending: Internal Medicine

## 2017-11-16 DIAGNOSIS — C778 Secondary and unspecified malignant neoplasm of lymph nodes of multiple regions: Secondary | ICD-10-CM | POA: Diagnosis not present

## 2017-11-16 DIAGNOSIS — E279 Disorder of adrenal gland, unspecified: Secondary | ICD-10-CM | POA: Diagnosis not present

## 2017-11-16 DIAGNOSIS — I2510 Atherosclerotic heart disease of native coronary artery without angina pectoris: Secondary | ICD-10-CM | POA: Insufficient documentation

## 2017-11-16 DIAGNOSIS — J90 Pleural effusion, not elsewhere classified: Secondary | ICD-10-CM | POA: Diagnosis not present

## 2017-11-16 DIAGNOSIS — G620 Drug-induced polyneuropathy: Secondary | ICD-10-CM | POA: Insufficient documentation

## 2017-11-16 DIAGNOSIS — G622 Polyneuropathy due to other toxic agents: Secondary | ICD-10-CM | POA: Diagnosis not present

## 2017-11-16 DIAGNOSIS — C3412 Malignant neoplasm of upper lobe, left bronchus or lung: Secondary | ICD-10-CM | POA: Diagnosis not present

## 2017-11-16 DIAGNOSIS — R5383 Other fatigue: Secondary | ICD-10-CM | POA: Diagnosis not present

## 2017-11-16 DIAGNOSIS — M4856XA Collapsed vertebra, not elsewhere classified, lumbar region, initial encounter for fracture: Secondary | ICD-10-CM | POA: Insufficient documentation

## 2017-11-16 DIAGNOSIS — X58XXXA Exposure to other specified factors, initial encounter: Secondary | ICD-10-CM | POA: Insufficient documentation

## 2017-11-16 DIAGNOSIS — K8020 Calculus of gallbladder without cholecystitis without obstruction: Secondary | ICD-10-CM | POA: Diagnosis not present

## 2017-11-16 DIAGNOSIS — I700 Atherosclerosis of aorta: Secondary | ICD-10-CM | POA: Insufficient documentation

## 2017-11-16 DIAGNOSIS — C7931 Secondary malignant neoplasm of brain: Secondary | ICD-10-CM | POA: Diagnosis not present

## 2017-11-16 DIAGNOSIS — K5900 Constipation, unspecified: Secondary | ICD-10-CM | POA: Diagnosis not present

## 2017-11-16 LAB — CBC WITH DIFFERENTIAL (CANCER CENTER ONLY)
Basophils Absolute: 0 10*3/uL (ref 0.0–0.1)
Basophils Relative: 0 %
Eosinophils Absolute: 0.1 10*3/uL (ref 0.0–0.5)
Eosinophils Relative: 2 %
HCT: 36.2 % (ref 34.8–46.6)
Hemoglobin: 11.6 g/dL (ref 11.6–15.9)
LYMPHS ABS: 1.6 10*3/uL (ref 0.9–3.3)
LYMPHS PCT: 34 %
MCH: 34.7 pg — AB (ref 25.1–34.0)
MCHC: 32 g/dL (ref 31.5–36.0)
MCV: 108.4 fL — ABNORMAL HIGH (ref 79.5–101.0)
MONOS PCT: 9 %
Monocytes Absolute: 0.4 10*3/uL (ref 0.1–0.9)
Neutro Abs: 2.5 10*3/uL (ref 1.5–6.5)
Neutrophils Relative %: 55 %
PLATELETS: 150 10*3/uL (ref 145–400)
RBC: 3.34 MIL/uL — AB (ref 3.70–5.45)
RDW: 13.6 % (ref 11.2–14.5)
WBC: 4.6 10*3/uL (ref 3.9–10.3)

## 2017-11-16 LAB — CMP (CANCER CENTER ONLY)
ALBUMIN: 3.2 g/dL — AB (ref 3.5–5.0)
ALT: 15 U/L (ref 0–55)
AST: 26 U/L (ref 5–34)
Alkaline Phosphatase: 91 U/L (ref 40–150)
Anion gap: 5 (ref 3–11)
BUN: 6 mg/dL — AB (ref 7–26)
CHLORIDE: 109 mmol/L (ref 98–109)
CO2: 26 mmol/L (ref 22–29)
Calcium: 9.1 mg/dL (ref 8.4–10.4)
Creatinine: 0.66 mg/dL (ref 0.60–1.10)
GFR, Est AFR Am: >60 mL/min (ref >60)
Glucose, Bld: 105 mg/dL (ref 70–140)
POTASSIUM: 3.8 mmol/L (ref 3.5–5.1)
SODIUM: 140 mmol/L (ref 136–145)
Total Bilirubin: 0.5 mg/dL (ref 0.2–1.2)
Total Protein: 7.4 g/dL (ref 6.4–8.3)

## 2017-11-16 MED ORDER — IOHEXOL 300 MG/ML  SOLN
100.0000 mL | Freq: Once | INTRAMUSCULAR | Status: AC | PRN
  Administered 2017-11-16: 100 mL via INTRAVENOUS

## 2017-11-18 ENCOUNTER — Telehealth: Payer: Self-pay | Admitting: Internal Medicine

## 2017-11-18 ENCOUNTER — Inpatient Hospital Stay (HOSPITAL_BASED_OUTPATIENT_CLINIC_OR_DEPARTMENT_OTHER): Payer: Medicare Other | Admitting: Internal Medicine

## 2017-11-18 ENCOUNTER — Encounter: Payer: Self-pay | Admitting: Internal Medicine

## 2017-11-18 VITALS — BP 138/71 | HR 93 | Temp 98.2°F | Resp 18 | Ht 61.0 in | Wt 131.8 lb

## 2017-11-18 DIAGNOSIS — G620 Drug-induced polyneuropathy: Secondary | ICD-10-CM

## 2017-11-18 DIAGNOSIS — C7931 Secondary malignant neoplasm of brain: Secondary | ICD-10-CM | POA: Diagnosis not present

## 2017-11-18 DIAGNOSIS — C3412 Malignant neoplasm of upper lobe, left bronchus or lung: Secondary | ICD-10-CM

## 2017-11-18 DIAGNOSIS — E279 Disorder of adrenal gland, unspecified: Secondary | ICD-10-CM | POA: Diagnosis not present

## 2017-11-18 DIAGNOSIS — R5383 Other fatigue: Secondary | ICD-10-CM | POA: Diagnosis not present

## 2017-11-18 DIAGNOSIS — G622 Polyneuropathy due to other toxic agents: Secondary | ICD-10-CM

## 2017-11-18 DIAGNOSIS — G629 Polyneuropathy, unspecified: Secondary | ICD-10-CM | POA: Insufficient documentation

## 2017-11-18 DIAGNOSIS — C778 Secondary and unspecified malignant neoplasm of lymph nodes of multiple regions: Secondary | ICD-10-CM

## 2017-11-18 DIAGNOSIS — C3490 Malignant neoplasm of unspecified part of unspecified bronchus or lung: Secondary | ICD-10-CM

## 2017-11-18 NOTE — Progress Notes (Signed)
[name] [phone]   Fax:([phone] [place] [phone]  DIAGNOSIS: stage IV(T2b, N3, M1c)  lung cancer pending further staging workup and tissue diagnosis. The patient presented with large left upper lobe lung mass in addition to left hilar and mediastinal lymphadenopathy and left true vocal cord paralysis.  PRIOR THERAPY:  1) Palliative radiotherapy to the large left upper lobe lung mass as well as metastatic brain lesions. 2)  systemic chemotherapy with carboplatin for AUC of 5 and paclitaxel 175 MG/M2 every 3 weeks. First dose on 04/15/2017.  Status post 6 cycles.  Paclitaxel has been reduced to 150 mg/M2 starting from cycle #4 secondary to peripheral neuropathy.  Last dose of chemotherapy was given July 29, 2017.  CURRENT THERAPY: Observation.  INTERVAL HISTORY: Jacqueline Kidd 77 y.o. female returns to the clinic today for follow-up visit accompanied by her husband.  The patient is feeling fine today with no specific complaints except for generalized fatigue as well as peripheral neuropathy.  She is currently on gabapentin 400 mg p.o. 3 times daily but she has no significant improvement in her peripheral neuropathy.  She denied having any current chest pain, shortness of breath, cough or hemoptysis.  She denied having any fever or chills.  She has no nausea, vomiting, diarrhea or constipation.  She has no significant weight loss.  The patient had a repeat CT scan of the chest, abdomen and pelvis performed recently and she is here for evaluation and discussion of her risk her results.   MEDICAL HISTORY: Past Medical History:  Diagnosis Date  . Chronic low back pain   . COPD (chronic obstructive pulmonary disease) ([place])   . GERD (gastroesophageal reflux disease)   . Hyperlipidemia   . Hypertension   . Lesion of left lung   . lung ca dx'd 01/2017  . Osteoporosis     ALLERGIES:  is  allergic to fosamax [alendronate]; hydrocodone-acetaminophen; and lisinopril-hydrochlorothiazide.  MEDICATIONS:  Current Outpatient Medications  Medication Sig Dispense Refill  . acetaminophen (TYLENOL) 325 MG tablet Take 650 mg by mouth every 6 (six) hours as needed for mild pain or moderate pain.     . Albuterol Sulfate 108 (90 Base) MCG/ACT AEPB Inhale 2 puffs into the lungs every 6 (six) hours as needed. 1 each 0  . gabapentin (NEURONTIN) 400 MG capsule Take 1 capsule (400 mg total) by mouth 3 (three) times daily. 270 capsule 0  . LORazepam (ATIVAN) 0.5 MG tablet Take 1 tablet (0.5 mg total) by mouth as needed for anxiety (take one tablet 30 minutes prior to MRI scans and radiation treatment). 10 tablet 0  . cholecalciferol (VITAMIN D) 1000 units tablet Take 1,000 Units by mouth daily.     No current facility-administered medications for this visit.     SURGICAL HISTORY:  Past Surgical History:  Procedure Laterality Date  . ABDOMINAL HYSTERECTOMY    . TOTAL HIP ARTHROPLASTY     right    REVIEW OF SYSTEMS:  A comprehensive review of systems was negative except for: Constitutional: positive for fatigue Neurological: positive for paresthesia   PHYSICAL EXAMINATION: General appearance: alert, cooperative, fatigued and no distress Head: Normocephalic, without obvious abnormality, atraumatic Neck: no adenopathy, no JVD, supple, symmetrical, trachea midline and thyroid not enlarged, symmetric, no tenderness/mass/nodules Lymph nodes: Cervical, supraclavicular, and axillary nodes normal. Resp: clear to auscultation bilaterally Back: symmetric, no curvature. ROM normal. No  CVA tenderness. Cardio: regular rate and rhythm, S1, S2 normal, no murmur, click, rub or gallop GI: soft, non-tender; bowel sounds normal; no masses,  no organomegaly Extremities: extremities normal, atraumatic, no cyanosis or edema  ECOG PERFORMANCE STATUS: 1 - Symptomatic but completely ambulatory  Blood pressure  138/71, pulse 93, temperature 98.2 F (36.8 C), temperature source Oral, resp. rate 18, height 5\' 1"  (1.549 m), weight 131 lb 12.8 oz (59.8 kg), SpO2 95 %.  LABORATORY DATA: Lab Results  Component Value Date   WBC 4.6 11/16/2017   HGB 11.6 11/16/2017   HCT 36.2 11/16/2017   MCV 108.4 (H) 11/16/2017   PLT 150 11/16/2017      Chemistry      Component Value Date/Time   NA 140 11/16/2017 1124   NA 138 07/08/2017 0831   K 3.8 11/16/2017 1124   K 3.3 (L) 07/08/2017 0831   CL 109 11/16/2017 1124   CO2 26 11/16/2017 1124   CO2 24 07/08/2017 0831   BUN 6 (L) 11/16/2017 1124   BUN 4.5 (L) 07/08/2017 0831   CREATININE 0.66 11/16/2017 1124   CREATININE 0.6 07/08/2017 0831      Component Value Date/Time   CALCIUM 9.1 11/16/2017 1124   CALCIUM 9.0 07/08/2017 0831   ALKPHOS 91 11/16/2017 1124   ALKPHOS 119 07/08/2017 0831   AST 26 11/16/2017 1124   AST 25 07/08/2017 0831   ALT 15 11/16/2017 1124   ALT 16 07/08/2017 0831   BILITOT 0.5 11/16/2017 1124   BILITOT 0.52 07/08/2017 0831       RADIOGRAPHIC STUDIES: Ct Chest W Contrast  Result Date: 11/16/2017 CLINICAL DATA:  Left lung cancer, stage IV, diagnosed in July 2018. Completed chemotherapy and radiation therapy. Current cough and constipation. EXAM: CT CHEST, ABDOMEN, AND PELVIS WITH CONTRAST TECHNIQUE: Multidetector CT imaging of the chest, abdomen and pelvis was performed following the standard protocol during bolus administration of intravenous contrast. CONTRAST:  152mL OMNIPAQUE IOHEXOL 300 MG/ML  SOLN COMPARISON:  Multiple exams, including 08/12/2017 FINDINGS: CT CHEST FINDINGS Cardiovascular: Coronary, aortic arch, and branch vessel atherosclerotic vascular disease. Aberrant right subclavian artery passes behind the esophagus. Trace pericardial effusion. Mediastinum/Nodes: Indistinct paratracheal tissue planes, fairly similar to prior. Indistinct esophageal wall margins, cannot exclude esophagitis. No individually enlarged  thoracic lymph nodes are identified. Lungs/Pleura: Medially in the right upper lobe on image 27/7 there is an ill-defined 1.3 by 0.9 cm nodule new compared to 08/12/2017. Peripheral interstitial accentuation in both lungs. Irregularly marginated left upper lobe mass 3.3 by 3.0 cm on image 42/7, previously 3.2 by 3.2 cm, with bandlike surrounding irregularity extending up to the lung apex anteriorly. Some of this appearance may reflect prior radiation therapy. There is some mild increase in ground-glass opacity in the left upper lobe paramediastinal region for example on image 57/7 which could reflect radiation related findings or lymphangitic carcinomatosis. Small nodules in the superior segment left lower lobe are noted including a 3 mm nodule on image 45/7. New small left pleural effusion, nonspecific for transudative or exudative etiology. Musculoskeletal: Thoracic spondylosis and bony demineralization. CT ABDOMEN PELVIS FINDINGS Hepatobiliary: Small gallstones are present dependently in the gallbladder. Pancreas: Unremarkable Spleen: Old granulomatous disease with punctate calcifications observed. Adrenals/Urinary Tract: New 2.0 by 1.2 by 1.1 cm hypodense right adrenal mass on image 51/2 is nonspecific but could well be a metastatic lesion. The kidneys appear unremarkable. Stomach/Bowel: Unremarkable Vascular/Lymphatic: Aortoiliac atherosclerotic vascular disease. No adenopathy identified. Reproductive: Unremarkable Other: No supplemental non-categorized findings. Musculoskeletal: Stable appearance  of superior endplate compression fractures at all lumbar levels and inferior endplate compression fracture at L3. Bilateral foraminal impingement at L3-4 with left foraminal impingement at L4-5. Grade 1 anterolisthesis at L4-5. Right total hip prosthesis. IMPRESSION: 1. The left upper lobe mass is similar in size and appearance compared to the prior exam. However, there is new hypodense lesion of the right adrenal  gland which is technically nonspecific but concerning for potential adrenal metastatic lesion in this context. 2. Similar post therapy related findings in the mediastinum and left lung. Strictly speaking, some of the nodularity in the superior segment left lower lobe and sub solid density in the left upper lobe could reflect metastatic disease or lymphangitic carcinomatosis, respectively. 3. New small left pleural effusion. 4. Other imaging findings of potential clinical significance: Aortic Atherosclerosis (ICD10-I70.0). Coronary atherosclerosis aberrant right subclavian artery. Possible mid esophageal. Stable compression fractures at all lumbar levels. Bony demineralization. Cholelithiasis. Old granulomatous disease. Electronically Signed   By: Van Clines M.D.   On: 11/16/2017 15:04   Ct Abdomen Pelvis W Contrast  Result Date: 11/16/2017 CLINICAL DATA:  Left lung cancer, stage IV, diagnosed in July 2018. Completed chemotherapy and radiation therapy. Current cough and constipation. EXAM: CT CHEST, ABDOMEN, AND PELVIS WITH CONTRAST TECHNIQUE: Multidetector CT imaging of the chest, abdomen and pelvis was performed following the standard protocol during bolus administration of intravenous contrast. CONTRAST:  125mL OMNIPAQUE IOHEXOL 300 MG/ML  SOLN COMPARISON:  Multiple exams, including 08/12/2017 FINDINGS: CT CHEST FINDINGS Cardiovascular: Coronary, aortic arch, and branch vessel atherosclerotic vascular disease. Aberrant right subclavian artery passes behind the esophagus. Trace pericardial effusion. Mediastinum/Nodes: Indistinct paratracheal tissue planes, fairly similar to prior. Indistinct esophageal wall margins, cannot exclude esophagitis. No individually enlarged thoracic lymph nodes are identified. Lungs/Pleura: Medially in the right upper lobe on image 27/7 there is an ill-defined 1.3 by 0.9 cm nodule new compared to 08/12/2017. Peripheral interstitial accentuation in both lungs. Irregularly  marginated left upper lobe mass 3.3 by 3.0 cm on image 42/7, previously 3.2 by 3.2 cm, with bandlike surrounding irregularity extending up to the lung apex anteriorly. Some of this appearance may reflect prior radiation therapy. There is some mild increase in ground-glass opacity in the left upper lobe paramediastinal region for example on image 57/7 which could reflect radiation related findings or lymphangitic carcinomatosis. Small nodules in the superior segment left lower lobe are noted including a 3 mm nodule on image 45/7. New small left pleural effusion, nonspecific for transudative or exudative etiology. Musculoskeletal: Thoracic spondylosis and bony demineralization. CT ABDOMEN PELVIS FINDINGS Hepatobiliary: Small gallstones are present dependently in the gallbladder. Pancreas: Unremarkable Spleen: Old granulomatous disease with punctate calcifications observed. Adrenals/Urinary Tract: New 2.0 by 1.2 by 1.1 cm hypodense right adrenal mass on image 51/2 is nonspecific but could well be a metastatic lesion. The kidneys appear unremarkable. Stomach/Bowel: Unremarkable Vascular/Lymphatic: Aortoiliac atherosclerotic vascular disease. No adenopathy identified. Reproductive: Unremarkable Other: No supplemental non-categorized findings. Musculoskeletal: Stable appearance of superior endplate compression fractures at all lumbar levels and inferior endplate compression fracture at L3. Bilateral foraminal impingement at L3-4 with left foraminal impingement at L4-5. Grade 1 anterolisthesis at L4-5. Right total hip prosthesis. IMPRESSION: 1. The left upper lobe mass is similar in size and appearance compared to the prior exam. However, there is new hypodense lesion of the right adrenal gland which is technically nonspecific but concerning for potential adrenal metastatic lesion in this context. 2. Similar post therapy related findings in the mediastinum and left lung. Strictly speaking, some  of the nodularity in the  superior segment left lower lobe and sub solid density in the left upper lobe could reflect metastatic disease or lymphangitic carcinomatosis, respectively. 3. New small left pleural effusion. 4. Other imaging findings of potential clinical significance: Aortic Atherosclerosis (ICD10-I70.0). Coronary atherosclerosis aberrant right subclavian artery. Possible mid esophageal. Stable compression fractures at all lumbar levels. Bony demineralization. Cholelithiasis. Old granulomatous disease. Electronically Signed   By: Van Clines M.D.   On: 11/16/2017 15:04    ASSESSMENT AND PLAN: This is a very pleasant 77 years old white female with stage IV (T2b, N3, M1 C) non-small cell lung cancer, squamous cell carcinoma presented with large left upper lobe lung mass in addition to mediastinal and supraclavicular lymphadenopathy as well as metastatic brain lesions diagnosed in September 2018. The patient underwent a course of palliative radiotherapy to the left upper lobe lung mass as well as the metastatic brain lesions. She is currently undergoing palliative systemic chemotherapy with carboplatin for AC of 5 and paclitaxel 175 mg/M2 every 3 weeks, status post 6 cycles. She currently on observation.  She is feeling fine.  Repeat CT scan of the chest, abdomen and pelvis showed no concerning findings for disease progression except for slightly increased size of right adrenal lesion. I personally and independently reviewed the scan images and discussed the results with the patient and her husband today.  I recommended for her to continue on observation with repeat CT scan of the chest, abdomen and pelvis in 3 months for restaging of her disease and close monitoring of the right adrenal gland lesion. For the peripheral neuropathy, she will continue on gabapentin 400 mg p.o. 3 times daily.  I would also refer the patient to see Dr. Mickeal Skinner, neuro oncology at the cancer center for recommendation regarding her peripheral  neuropathy. She was advised to call immediately if she has any concerning symptoms in the interval. The patient voices understanding of current disease status and treatment options and is in agreement with the current care plan. All questions were answered. The patient knows to call the clinic with any problems, questions or concerns. We can certainly see the patient much sooner if necessary.  Disclaimer: This note was dictated with voice recognition software. Similar sounding words can inadvertently be transcribed and may not be corrected upon review.

## 2017-11-18 NOTE — Telephone Encounter (Signed)
Scheduled appt per 5/15 los - sent reminder letter in the mail with appt date and time.

## 2017-11-26 ENCOUNTER — Other Ambulatory Visit: Payer: Self-pay | Admitting: Pediatrics

## 2017-11-26 ENCOUNTER — Ambulatory Visit: Payer: Self-pay | Admitting: Urology

## 2017-11-26 DIAGNOSIS — G629 Polyneuropathy, unspecified: Secondary | ICD-10-CM

## 2017-11-27 ENCOUNTER — Encounter: Payer: Self-pay | Admitting: Urology

## 2017-11-27 ENCOUNTER — Other Ambulatory Visit: Payer: Self-pay

## 2017-11-27 ENCOUNTER — Ambulatory Visit
Admission: RE | Admit: 2017-11-27 | Discharge: 2017-11-27 | Disposition: A | Discharge status: Home / Self Care | Payer: Medicare Other | Source: Ambulatory Visit | Attending: Urology | Admitting: Urology

## 2017-11-27 VITALS — BP 121/72 | HR 104 | Temp 98.3°F | Resp 18 | Ht 61.0 in | Wt 128.2 lb

## 2017-11-27 DIAGNOSIS — K8020 Calculus of gallbladder without cholecystitis without obstruction: Secondary | ICD-10-CM | POA: Insufficient documentation

## 2017-11-27 DIAGNOSIS — R599 Enlarged lymph nodes, unspecified: Secondary | ICD-10-CM | POA: Diagnosis not present

## 2017-11-27 DIAGNOSIS — C7931 Secondary malignant neoplasm of brain: Secondary | ICD-10-CM | POA: Insufficient documentation

## 2017-11-27 DIAGNOSIS — K5900 Constipation, unspecified: Secondary | ICD-10-CM | POA: Diagnosis not present

## 2017-11-27 DIAGNOSIS — Z923 Personal history of irradiation: Secondary | ICD-10-CM | POA: Diagnosis not present

## 2017-11-27 DIAGNOSIS — C7971 Secondary malignant neoplasm of right adrenal gland: Secondary | ICD-10-CM | POA: Insufficient documentation

## 2017-11-27 DIAGNOSIS — Z79899 Other long term (current) drug therapy: Secondary | ICD-10-CM | POA: Diagnosis not present

## 2017-11-27 DIAGNOSIS — Z96641 Presence of right artificial hip joint: Secondary | ICD-10-CM | POA: Diagnosis not present

## 2017-11-27 DIAGNOSIS — C778 Secondary and unspecified malignant neoplasm of lymph nodes of multiple regions: Secondary | ICD-10-CM | POA: Insufficient documentation

## 2017-11-27 DIAGNOSIS — C3412 Malignant neoplasm of upper lobe, left bronchus or lung: Secondary | ICD-10-CM | POA: Insufficient documentation

## 2017-11-27 DIAGNOSIS — J90 Pleural effusion, not elsewhere classified: Secondary | ICD-10-CM | POA: Insufficient documentation

## 2017-11-27 NOTE — Progress Notes (Signed)
Radiation Oncology         [phone] ________________________________  Name: Jacqueline Kidd MRN: [id number]  Date: 11/27/2017  DOB: [birth year]  Post Treatment Note  CC: Jacqueline Maize, MD  Jacqueline Carl, MD  Diagnosis:   77 y.o. female with stage IV (T2b, N3, M1 C) non-small cell lung cancer, squamous cell carcinoma of the left upper lung with neck and mediastinal nodal disease and brain metastases.   Interval Since Last Radiation:  5weeks  1.   10/22/2017:  Brain PTV3-5 // 20 Gy in 1 fraction  1. PTV3: Right Temporal 56mm  2. PTV4: Left Temporal 43mm  3. PTV5: Right Occipital 17mm  2.  Chest radiation 03/23/2017 - 05/06/2017- The primary tumor and involved mediastinal adenopathy were treated to 66 Gy in 33 fractions of 2 Gy.  3.  SRS on 04/03/17: Brain PTV1-2 // 20 Gy in 1 fraction.  1.  PTV1: Left cerebellar 12 mm target  2.  PTV2: Left insular 7 mm target    Narrative:  The patient returns today for routine 1 month follow-up since completion of recent SRS to 3 new brain lesions.  She tolerated treatment well and is currently without complaints.  In summary, she initially presented to her PCP, Jacqueline Kidd, back in June 2018 with complaints of hoarseness of her voice.  On evaluation with CT of the neck, she was found to have a 5.5 x 5.2 x 4.7 cm left upper lobe lung mass with associated left hilar and mediastinal lymphadenopathy likely impacting the left recurrent laryngeal nerve. There was also laterally displaced left true vocal cord and irregularity of the left false cord, likely related to vocal cord paralysis associated with the mediastinal tumor.  She then had CT scan of the chest on 02/23/2017 which showed a large left upper lobe mass extending to the pleural surface of the mediastinum at the level of the AP window, suspicious for mediastinal invasion. There was irregular metastatic left lower paratracheal and AP window adenopathy as well as subcarinal adenopathy.  PET scan was  performed on 05-20-202018, confirming stage IIIB(T2b, N3, M0)  lung cancer.  MRI of the brain was performed 03/05/18 revealing two subcentimeter intracranial metastases within the LEFT temporal lobe and LEFT cerebellum.  She had a CT-guided core biopsy of the left upper lobe lung mass on 03/18/17 and the final pathology ([id number]) was consistent with squamous cell carcinoma.  She elected to proceed with palliative radiation treatments to the chest which were completed between 03/23/2017 - 05/06/2017.  Additionally, she completed SRS treatment for the two brain metastases on 04/03/2017.  She tolerated her treatments well. She was started on palliative systemic chemotherapy with carboplatin and paclitaxel every 3 weeks- first dose on 04/15/2017.  Repeat CT scan of the chest, abdomen and pelvis performed 06/19/18 showed reduction in the left upper lobe lung mass as well as the mediastinal lymphadenopathy with no new or progressive disease in the chest, abdomen or pelvis. Follow up MRI brain on 07/10/17 showed an excellent response to treatment with decreased size of both treated lesions and no evidence of new disease.   She completed 6 cycles of palliative chemotherapy on 07/29/17 and went on observation.  A repeat CT Chest on 08/12/17 showed no concerning findings for disease progression.  Unfortunately, a follow-up brain MRI on 10/08/2017 revealed 3 new subcentimeter metastases with a 4 mm lesion in the RIGHT inferior temporal lobe, a 2 mm lesion in the right occipital lobe and a 4 mm lesion  in the left temporal lobe without significant mass-effect or surrounding edema.  There was also noted interval growth of the previously treated lesion in the left insula but felt related to treatment effect.  She elected to proceed with stereotactic radiosurgery to the 3 new lesions in the brain and this was completed on 10/22/17 and tolerated well.  She has remained on observation only and her most recent repeat systemic imaging with  CT scan of the chest, abdomen and pelvis on 11/16/17 showed no concerning findings for disease progression except for slightly increased size of a right adrenal lesion.  At the time of her most recent follow-up visit with Jacqueline Kidd on 11/18/2017, the decision was to continue in observation only and repeat systemic imaging in 3 months for close monitoring of the right adrenal gland lesion.                  REVIEW OF SYSTEMS:  On review of systems, the patient states that she has been doing fairly well overall.  She denies recent fevers, chills, chest pain, increased shortness of breath, cough or hemoptysis.  Esophagitis has resolved and she has been able to resume a normal diet. She continues with a decreased appetite but is maintaining her weight. She has persistent some numbness and tingling in her fingers/hands bilaterally as well as burning pains in the lower extremities bilaterally secondary to chemotherapy induced neuropathy.  She has continued taking neurontin with some improvement in sxs.  She denies headaches, visual or auditory changes, tinnitus, dizziness, tremors, focal weakness, seizure activity or imbalance.   ALLERGIES:  is allergic to fosamax [alendronate]; hydrocodone-acetaminophen; and lisinopril-hydrochlorothiazide.  Meds: Current Outpatient Medications  Medication Sig Dispense Refill  . Albuterol Sulfate 108 (90 Base) MCG/ACT AEPB Inhale 2 puffs into the lungs every 6 (six) hours as needed. 1 each 0  . gabapentin (NEURONTIN) 400 MG capsule TAKE 1 CAPSULE (400 MG TOTAL) BY MOUTH 3 (THREE) TIMES DAILY. 270 capsule 0  . acetaminophen (TYLENOL) 325 MG tablet Take 650 mg by mouth every 6 (six) hours as needed for mild pain or moderate pain.     . cholecalciferol (VITAMIN D) 1000 units tablet Take 1,000 Units by mouth daily.    Marland Kitchen LORazepam (ATIVAN) 0.5 MG tablet Take 1 tablet (0.5 mg total) by mouth as needed for anxiety (take one tablet 30 minutes prior to MRI scans and radiation  treatment). (Patient not taking: Reported on 11/27/2017) 10 tablet 0   No current facility-administered medications for this encounter.     Physical Findings:  height is 5\' 1"  (1.549 m) and weight is 128 lb 3.2 oz (58.2 kg). Her oral temperature is 98.3 F (36.8 C). Her blood pressure is 121/72 and her pulse is 104 (abnormal). Her respiration is 18 and oxygen saturation is 94%.  Pain Assessment Pain Score: 0-No pain/10 In general this is a well appearing caucasian female in no acute distress. She's alert and oriented x4 and appropriate throughout the examination. Cardiopulmonary assessment is negative for acute distress and she exhibits normal effort. She appears grossly neurologically intact. EOMs are intact bilaterally and PERRLA.  Strength is 5 out of 5 and equal bilaterally in the upper and lower extremities.  Sensation is intact to light touch in the upper and lower extremities.  Lab Findings: Lab Results  Component Value Date   WBC 4.6 11/16/2017   HGB 11.6 11/16/2017   HCT 36.2 11/16/2017   MCV 108.4 (H) 11/16/2017   PLT 150 11/16/2017  Radiographic Findings: Ct Chest W Contrast  Result Date: 11/16/2017 CLINICAL DATA:  Left lung cancer, stage IV, diagnosed in July 2018. Completed chemotherapy and radiation therapy. Current cough and constipation. EXAM: CT CHEST, ABDOMEN, AND PELVIS WITH CONTRAST TECHNIQUE: Multidetector CT imaging of the chest, abdomen and pelvis was performed following the standard protocol during bolus administration of intravenous contrast. CONTRAST:  119mL OMNIPAQUE IOHEXOL 300 MG/ML  SOLN COMPARISON:  Multiple exams, including 08/12/2017 FINDINGS: CT CHEST FINDINGS Cardiovascular: Coronary, aortic arch, and branch vessel atherosclerotic vascular disease. Aberrant right subclavian artery passes behind the esophagus. Trace pericardial effusion. Mediastinum/Nodes: Indistinct paratracheal tissue planes, fairly similar to prior. Indistinct esophageal wall margins,  cannot exclude esophagitis. No individually enlarged thoracic lymph nodes are identified. Lungs/Pleura: Medially in the right upper lobe on image 27/7 there is an ill-defined 1.3 by 0.9 cm nodule new compared to 08/12/2017. Peripheral interstitial accentuation in both lungs. Irregularly marginated left upper lobe mass 3.3 by 3.0 cm on image 42/7, previously 3.2 by 3.2 cm, with bandlike surrounding irregularity extending up to the lung apex anteriorly. Some of this appearance may reflect prior radiation therapy. There is some mild increase in ground-glass opacity in the left upper lobe paramediastinal region for example on image 57/7 which could reflect radiation related findings or lymphangitic carcinomatosis. Small nodules in the superior segment left lower lobe are noted including a 3 mm nodule on image 45/7. New small left pleural effusion, nonspecific for transudative or exudative etiology. Musculoskeletal: Thoracic spondylosis and bony demineralization. CT ABDOMEN PELVIS FINDINGS Hepatobiliary: Small gallstones are present dependently in the gallbladder. Pancreas: Unremarkable Spleen: Old granulomatous disease with punctate calcifications observed. Adrenals/Urinary Tract: New 2.0 by 1.2 by 1.1 cm hypodense right adrenal mass on image 51/2 is nonspecific but could well be a metastatic lesion. The kidneys appear unremarkable. Stomach/Bowel: Unremarkable Vascular/Lymphatic: Aortoiliac atherosclerotic vascular disease. No adenopathy identified. Reproductive: Unremarkable Other: No supplemental non-categorized findings. Musculoskeletal: Stable appearance of superior endplate compression fractures at all lumbar levels and inferior endplate compression fracture at L3. Bilateral foraminal impingement at L3-4 with left foraminal impingement at L4-5. Grade 1 anterolisthesis at L4-5. Right total hip prosthesis. IMPRESSION: 1. The left upper lobe mass is similar in size and appearance compared to the prior exam. However,  there is new hypodense lesion of the right adrenal gland which is technically nonspecific but concerning for potential adrenal metastatic lesion in this context. 2. Similar post therapy related findings in the mediastinum and left lung. Strictly speaking, some of the nodularity in the superior segment left lower lobe and sub solid density in the left upper lobe could reflect metastatic disease or lymphangitic carcinomatosis, respectively. 3. New small left pleural effusion. 4. Other imaging findings of potential clinical significance: Aortic Atherosclerosis (ICD10-I70.0). Coronary atherosclerosis aberrant right subclavian artery. Possible mid esophageal. Stable compression fractures at all lumbar levels. Bony demineralization. Cholelithiasis. Old granulomatous disease. Electronically Signed   By: Van Clines M.D.   On: 11/16/2017 15:04   Ct Abdomen Pelvis W Contrast  Result Date: 11/16/2017 CLINICAL DATA:  Left lung cancer, stage IV, diagnosed in July 2018. Completed chemotherapy and radiation therapy. Current cough and constipation. EXAM: CT CHEST, ABDOMEN, AND PELVIS WITH CONTRAST TECHNIQUE: Multidetector CT imaging of the chest, abdomen and pelvis was performed following the standard protocol during bolus administration of intravenous contrast. CONTRAST:  128mL OMNIPAQUE IOHEXOL 300 MG/ML  SOLN COMPARISON:  Multiple exams, including 08/12/2017 FINDINGS: CT CHEST FINDINGS Cardiovascular: Coronary, aortic arch, and branch vessel atherosclerotic vascular disease. Aberrant right subclavian artery  passes behind the esophagus. Trace pericardial effusion. Mediastinum/Nodes: Indistinct paratracheal tissue planes, fairly similar to prior. Indistinct esophageal wall margins, cannot exclude esophagitis. No individually enlarged thoracic lymph nodes are identified. Lungs/Pleura: Medially in the right upper lobe on image 27/7 there is an ill-defined 1.3 by 0.9 cm nodule new compared to 08/12/2017. Peripheral  interstitial accentuation in both lungs. Irregularly marginated left upper lobe mass 3.3 by 3.0 cm on image 42/7, previously 3.2 by 3.2 cm, with bandlike surrounding irregularity extending up to the lung apex anteriorly. Some of this appearance may reflect prior radiation therapy. There is some mild increase in ground-glass opacity in the left upper lobe paramediastinal region for example on image 57/7 which could reflect radiation related findings or lymphangitic carcinomatosis. Small nodules in the superior segment left lower lobe are noted including a 3 mm nodule on image 45/7. New small left pleural effusion, nonspecific for transudative or exudative etiology. Musculoskeletal: Thoracic spondylosis and bony demineralization. CT ABDOMEN PELVIS FINDINGS Hepatobiliary: Small gallstones are present dependently in the gallbladder. Pancreas: Unremarkable Spleen: Old granulomatous disease with punctate calcifications observed. Adrenals/Urinary Tract: New 2.0 by 1.2 by 1.1 cm hypodense right adrenal mass on image 51/2 is nonspecific but could well be a metastatic lesion. The kidneys appear unremarkable. Stomach/Bowel: Unremarkable Vascular/Lymphatic: Aortoiliac atherosclerotic vascular disease. No adenopathy identified. Reproductive: Unremarkable Other: No supplemental non-categorized findings. Musculoskeletal: Stable appearance of superior endplate compression fractures at all lumbar levels and inferior endplate compression fracture at L3. Bilateral foraminal impingement at L3-4 with left foraminal impingement at L4-5. Grade 1 anterolisthesis at L4-5. Right total hip prosthesis. IMPRESSION: 1. The left upper lobe mass is similar in size and appearance compared to the prior exam. However, there is new hypodense lesion of the right adrenal gland which is technically nonspecific but concerning for potential adrenal metastatic lesion in this context. 2. Similar post therapy related findings in the mediastinum and left lung.  Strictly speaking, some of the nodularity in the superior segment left lower lobe and sub solid density in the left upper lobe could reflect metastatic disease or lymphangitic carcinomatosis, respectively. 3. New small left pleural effusion. 4. Other imaging findings of potential clinical significance: Aortic Atherosclerosis (ICD10-I70.0). Coronary atherosclerosis aberrant right subclavian artery. Possible mid esophageal. Stable compression fractures at all lumbar levels. Bony demineralization. Cholelithiasis. Old granulomatous disease. Electronically Signed   By: Van Clines M.D.   On: 11/16/2017 15:04    Impression/Plan: 1. 77 y.o. female with stage IV (T2b, N3, M1 C) NSCLC, squamous cell carcinoma of the left upper lung with neck and mediastinal nodal disease and brain metastases.   She appears to have recovered well from the effects of her most recent SRS procedure.  Her most recent systemic imaging is encouraging, without definite evidence of disease progression.  The plan is to continue in observation only and repeat systemic imaging in 3 months for close monitoring of the right adrenal gland lesion.  She will follow-up with Dr. Earlie Server shortly thereafter to review results and discuss further recommendations for management of her systemic disease. Regarding the metastatic brain disease, we will continue serial MRI brain scans with her next scan to be performed in July 2019.  Her case will be discussed and reviewed at the multidisciplinary brain conference prior to a follow-up visit in our office to review results and recommendations.  She knows to call at anytime with any questions or concerns related to her previous radiotherapy.  She is comfortable with and in agreement with this plan.  [name], PA-C

## 2017-11-27 NOTE — Addendum Note (Signed)
Encounter addended by: Malena Edman, RN on: 11/27/2017 2:15 PM  Actions taken: Charge Capture section accepted

## 2017-12-01 ENCOUNTER — Telehealth: Payer: Self-pay

## 2017-12-01 ENCOUNTER — Ambulatory Visit: Payer: Medicare Other | Admitting: Internal Medicine

## 2017-12-01 ENCOUNTER — Encounter: Payer: Self-pay | Admitting: Internal Medicine

## 2017-12-01 ENCOUNTER — Other Ambulatory Visit: Payer: Self-pay

## 2017-12-01 ENCOUNTER — Inpatient Hospital Stay: Payer: Medicare Other | Admitting: Internal Medicine

## 2017-12-01 VITALS — BP 117/75 | HR 96 | Temp 97.8°F | Resp 17 | Ht 61.0 in | Wt 128.5 lb

## 2017-12-01 DIAGNOSIS — C778 Secondary and unspecified malignant neoplasm of lymph nodes of multiple regions: Secondary | ICD-10-CM | POA: Diagnosis not present

## 2017-12-01 DIAGNOSIS — E279 Disorder of adrenal gland, unspecified: Secondary | ICD-10-CM | POA: Diagnosis not present

## 2017-12-01 DIAGNOSIS — C3412 Malignant neoplasm of upper lobe, left bronchus or lung: Secondary | ICD-10-CM

## 2017-12-01 DIAGNOSIS — G622 Polyneuropathy due to other toxic agents: Secondary | ICD-10-CM

## 2017-12-01 DIAGNOSIS — R5383 Other fatigue: Secondary | ICD-10-CM | POA: Diagnosis not present

## 2017-12-01 DIAGNOSIS — C7931 Secondary malignant neoplasm of brain: Secondary | ICD-10-CM | POA: Diagnosis not present

## 2017-12-01 DIAGNOSIS — G629 Polyneuropathy, unspecified: Secondary | ICD-10-CM

## 2017-12-01 DIAGNOSIS — G620 Drug-induced polyneuropathy: Secondary | ICD-10-CM | POA: Diagnosis not present

## 2017-12-01 MED ORDER — GABAPENTIN 400 MG PO CAPS: 800.0000 mg | ORAL_CAPSULE | Freq: Three times a day (TID) | ORAL | 3 refills | Status: DC

## 2017-12-01 NOTE — Telephone Encounter (Signed)
Completed on 5/16 by MX. Per 5/28 los

## 2017-12-01 NOTE — Progress Notes (Signed)
[facility] [address] [place], [place] [address] [phone]   New Patient Evaluation  Date of Service: 12/01/17 Patient Name: Jacqueline Kidd Patient MRN: [id number] Patient DOB: [birth year] Provider: Ventura Sellers, MD  Identifying Statement:  Jacqueline Kidd is a 77 y.o. female with Brain metastases ([place]) [C79.31] who presents for initial consultation and evaluation regarding cancer associated neurologic deficits.    Referring Provider: [name], MD [address], [place] [address]  Primary Cancer: [facility] Lung Stage IV  CNS Oncology History: 04/03/17: SRS to 2 supratentorial lesions 10/22/17: SRS to 3 (new) supratentorial lesions  History of Present Illness: The patient's records from the referring physician were obtained and reviewed and the patient interviewed to confirm this HPI.  ALAYASIA Kidd presents today for focused assessment of her neuropathic symptoms.  She describes impaired sensation in her fingers, as well as her feet and lower legs.  She doesn't describe frank pain, but there is "pins and needles" component.  Hands feel like "sand or sandpaper".  She has trouble locating where her feet are underneath her.  Symptoms began while undergoing cycles of chemotherapy for lung cancer, notably with platinum and taxol based treatments.  Despite dose reduction and eventual completion of therapy, her symptoms have remained.  She doesn't describe any recent worsening.  Walks on her own but getting around "is harder because of my feet".    Medications: Current Outpatient Medications on File Prior to Visit  Medication Sig Dispense Refill  . acetaminophen (TYLENOL) 325 MG tablet Take 650 mg by mouth every 6 (six) hours as needed for mild pain or moderate pain.     . Albuterol Sulfate 108 (90 Base) MCG/ACT AEPB Inhale 2 puffs into the lungs every 6 (six) hours as needed. 1 each 0  . cholecalciferol (VITAMIN D) 1000 units tablet Take  1,000 Units by mouth daily.    Marland Kitchen LORazepam (ATIVAN) 0.5 MG tablet Take 1 tablet (0.5 mg total) by mouth as needed for anxiety (take one tablet 30 minutes prior to MRI scans and radiation treatment). (Patient not taking: Reported on 11/27/2017) 10 tablet 0   No current facility-administered medications on file prior to visit.     Allergies:  Allergies  Allergen Reactions  . Fosamax [Alendronate]   . Hydrocodone-Acetaminophen Nausea And Vomiting    EXTREME VOMITING   . Lisinopril-Hydrochlorothiazide Other (See Comments)    Chest spasms   Past Medical History:  Past Medical History:  Diagnosis Date  . Chronic low back pain   . COPD (chronic obstructive pulmonary disease) ([place])   . GERD (gastroesophageal reflux disease)   . Hyperlipidemia   . Hypertension   . Lesion of left lung   . lung ca dx'd 01/2017  . Osteoporosis    Past Surgical History:  Past Surgical History:  Procedure Laterality Date  . ABDOMINAL HYSTERECTOMY    . TOTAL HIP ARTHROPLASTY     right   Social History:  Social History   Socioeconomic History  . Marital status: Married    Spouse name: Not on file  . Number of children: Not on file  . Years of education: Not on file  . Highest education level: Not on file  Occupational History  . Not on file  Social Needs  . Financial resource strain: Not on file  . Food insecurity:    Worry: Not on file    Inability: Not on file  . Transportation  needs:    Medical: Not on file    Non-medical: Not on file  Tobacco Use  . Smoking status: Former Smoker    Packs/day: 1.00    Years: 57.00    Pack years: 57.00    Last attempt to quit: 02/16/2017    Years since quitting: 0.7  . Smokeless tobacco: Never Used  Substance and Sexual Activity  . Alcohol use: No  . Drug use: No  . Sexual activity: Never  Lifestyle  . Physical activity:    Days per week: Not on file    Minutes per session: Not on file  . Stress: Not on file  Relationships  . Social connections:      Talks on phone: Not on file    Gets together: Not on file    Attends religious service: Not on file    Active member of club or organization: Not on file    Attends meetings of clubs or organizations: Not on file    Relationship status: Not on file  . Intimate partner violence:    Fear of current or ex partner: Not on file    Emotionally abused: Not on file    Physically abused: Not on file    Forced sexual activity: Not on file  Other Topics Concern  . Not on file  Social History Narrative  . Not on file   Family History:  Family History  Problem Relation Age of Onset  . Cancer Mother        breast  . Cancer Father        esophageal  . Cancer Sister        breast    Review of Systems: Constitutional: Denies fevers, chills or abnormal weight loss Eyes: Denies blurriness of vision Ears, nose, mouth, throat, and face: Denies mucositis or sore throat Respiratory: Denies cough, dyspnea or wheezes Cardiovascular: Denies palpitation, chest discomfort or lower extremity swelling Gastrointestinal:  Denies nausea, constipation, diarrhea GU: Denies dysuria or incontinence Skin: Denies abnormal skin rashes Neurological: Per HPI Musculoskeletal: Denies joint pain, back or neck discomfort. No decrease in ROM Behavioral/Psych: Denies anxiety, disturbance in thought content, and mood instability   Physical Exam: Vitals:   12/01/17 1218  BP: 117/75  Pulse: 96  Resp: 17  Temp: 97.8 F (36.6 C)  SpO2: 95%   KPS: 80. General: Alert, cooperative, pleasant, in no acute distress Head: Craniotomy scar noted, dry and intact. EENT: No conjunctival injection or scleral icterus. Oral mucosa moist Lungs: Resp effort normal Cardiac: Regular rate and rhythm Abdomen: Soft, non-distended abdomen Skin: No rashes cyanosis or petechiae. Extremities: No clubbing or edema  Neurologic Exam: Mental Status: Awake, alert, attentive to examiner. Oriented to self and environment. Language is  fluent with intact comprehension.  Cranial Nerves: Visual acuity is grossly normal. Visual fields are full. Extra-ocular movements intact. No ptosis. Face is symmetric, tongue midline. Motor: Tone and bulk are normal. Power is full in both arms and legs. Reflexes are absent or trace throughout, no pathologic reflexes present. Intact finger to nose bilaterally Sensory: Impaired to pain/temp in distal fingers and lower legs to mid shins bilaterally Gait: Mild sensory dystaxia  Labs: I have reviewed the data as listed    Component Value Date/Time   NA 140 11/16/2017 1124   NA 138 07/08/2017 0831   K 3.8 11/16/2017 1124   K 3.3 (L) 07/08/2017 0831   CL 109 11/16/2017 1124   CO2 26 11/16/2017 1124   CO2 24 07/08/2017  0831   GLUCOSE 105 11/16/2017 1124   GLUCOSE 127 07/08/2017 0831   BUN 6 (L) 11/16/2017 1124   BUN 4.5 (L) 07/08/2017 0831   CREATININE 0.66 11/16/2017 1124   CREATININE 0.6 07/08/2017 0831   CALCIUM 9.1 11/16/2017 1124   CALCIUM 9.0 07/08/2017 0831   PROT 7.4 11/16/2017 1124   PROT 7.0 07/08/2017 0831   ALBUMIN 3.2 (L) 11/16/2017 1124   ALBUMIN 2.9 (L) 07/08/2017 0831   AST 26 11/16/2017 1124   AST 25 07/08/2017 0831   ALT 15 11/16/2017 1124   ALT 16 07/08/2017 0831   ALKPHOS 91 11/16/2017 1124   ALKPHOS 119 07/08/2017 0831   BILITOT 0.5 11/16/2017 1124   BILITOT 0.52 07/08/2017 0831   GFRNONAA >60 11/16/2017 1124   GFRNONAA 76 01/21/2013 1526   GFRAA >60 11/16/2017 1124   GFRAA 88 01/21/2013 1526   Lab Results  Component Value Date   WBC 4.6 11/16/2017   NEUTROABS 2.5 11/16/2017   HGB 11.6 11/16/2017   HCT 36.2 11/16/2017   MCV 108.4 (H) 11/16/2017   PLT 150 11/16/2017    Imaging:  CLINICAL DATA:  History of non-small-cell lung cancer, 7 month follow-up SRS.  Left cerebellar 12 mm target was treated to a prescription dose of 20 Gy. Left insular 7 mm target was treated to a prescription dose of 20 Gy.  EXAM: MRI HEAD WITHOUT AND WITH  CONTRAST  TECHNIQUE: Multiplanar, multiecho pulse sequences of the brain and surrounding structures were obtained without and with intravenous contrast.  CONTRAST:  25mL MULTIHANCE GADOBENATE DIMEGLUMINE 529 MG/ML IV SOLN  Creatinine was obtained on site at [facility] at [address]  Results: Creatinine 0.5 mg/dL.  COMPARISON:  07/10/2017.  FINDINGS: Brain: Previous small area of RIGHT centrum semiovale restricted diffusion has resolved, and likely represented an acute/subacute incidental infarct.  Treated lesion LEFT cerebellum continues to regress, now 3 mm cross-section versus 4 on the most recent prior.  Treated lesion LEFT insula is now increased in size, 5 mm diameter, with mild restriction. This could represent recurrent tumor or post treatment effect.  Three new lesions are seen. There is a 4 mm lesion in the RIGHT inferior temporal lobe, subcortical white matter, series 10, image 50. A 2 mm lesion is seen in the RIGHT occipital lobe, periventricular white matter, series 10, image 61. A third lesion involves the LEFT temporal lobe, superior temporal gyrus, 4 mm, series 10, image 68. None have significant mass effect or surrounding edema.  Generalized atrophy with hydrocephalus ex vacuo. Extensive periventricular white matter signal abnormality, representing small vessel disease.  Vascular: Normal flow voids.  Skull and upper cervical spine: Normal marrow signal.  Sinuses/Orbits: Negative sinuses.  BILATERAL cataract extraction.  Other: Unchanged parotid lesions.  IMPRESSION: Three new subcentimeter metastases are identified in the brain. See discussion above.  Interval growth of the treated lesion in the LEFT insula could represent recurrent tumor or post treatment effect.  Continued involution of the treated LEFT cerebellar metastasis.  Resolved RIGHT centrum semiovale infarct.   Electronically Signed   By: Staci Righter M.D.   On: 10/08/2017 13:25    Assessment/Plan 1. 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 Brain metastases [facility])  Ms. 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  No further diagnostic workup is necessary at this time.  We recommended increasing Gabapentin to 800mg  TID.  Neuropathic pain dosage often needs to reach 2400-3600mg  for efficacy.  Unfortunately she  has less pain than sensory impairment, which may limit efficacy of pharmacological approaches.   Can also consider adding Cymbalta or switching to Pregabalin with treatment failure.    She should return to clinic in August following next visit with Dr. Julien Nordmann for further evaluation.    Next MRI brain in July will be reviewed in brain tumor board.   We appreciate the opportunity to participate in the care of Ayjah J Delange.   All questions were answered. The patient knows to call the clinic with any problems, questions or concerns. No barriers to learning were detected.  The total time spent in the encounter was 40 minutes and more than 50% was on counseling and review of test results   Ventura Sellers, MD Medical Director of Neuro-Oncology [facility] [place] 12/01/17 4:51 PM

## 2017-12-23 ENCOUNTER — Other Ambulatory Visit: Payer: Self-pay | Admitting: *Deleted

## 2017-12-23 DIAGNOSIS — C7931 Secondary malignant neoplasm of brain: Secondary | ICD-10-CM

## 2018-01-18 NOTE — Progress Notes (Signed)
Jacqueline Kidd 77 y.o. woman with  3 subcentimeter brain mets from NSCLC of the left upper lung completed radiation  review 01-21-18 MRI brain w wo contrast SRS FU   Headache:Yes often in the mornings when she wakes up doesn't last very long. Pain:9/10 hands and feet taking Neurotin Dizziness:No Nausea/vomiting:No Ringing in ears: Yes both ears for years.   Visual changes (Blurred/ diplopia double vision,blind spots, and peripheral vsion changes):No,wears reading glasses. Fatigue:Yes most of the time. Cognitive changes:Alert and oriented today.  Feels she is having memory problems like a name of a person. Doesn't feel she is really forgetful. Wt Readings from Last 3 Encounters:  01/27/18 129 lb (58.5 kg)  12/01/17 128 lb 8 oz (58.3 kg)  11/27/17 128 lb 3.2 oz (58.2 kg)  BP 134/62 (BP Location: Right Arm, Patient Position: Sitting, Cuff Size: Normal)   Pulse 88   Temp 98.7 F (37.1 C) (Oral)   Resp 18   Ht 5\' 1"  (1.549 m)   Wt 129 lb (58.5 kg)   SpO2 95%   BMI 24.37 kg/m

## 2018-01-21 ENCOUNTER — Ambulatory Visit
Admission: RE | Admit: 2018-01-21 | Discharge: 2018-01-21 | Disposition: A | Discharge status: Home / Self Care | Payer: Medicare Other | Source: Ambulatory Visit | Attending: Radiation Oncology | Admitting: Radiation Oncology

## 2018-01-21 DIAGNOSIS — C7931 Secondary malignant neoplasm of brain: Secondary | ICD-10-CM | POA: Diagnosis not present

## 2018-01-21 DIAGNOSIS — C3490 Malignant neoplasm of unspecified part of unspecified bronchus or lung: Secondary | ICD-10-CM | POA: Diagnosis not present

## 2018-01-21 DIAGNOSIS — C801 Malignant (primary) neoplasm, unspecified: Secondary | ICD-10-CM | POA: Diagnosis not present

## 2018-01-21 MED ORDER — GADOBENATE DIMEGLUMINE 529 MG/ML IV SOLN
10.0000 mL | Freq: Once | INTRAVENOUS | Status: AC | PRN
  Administered 2018-01-21: 10 mL via INTRAVENOUS

## 2018-01-27 ENCOUNTER — Ambulatory Visit
Admission: RE | Admit: 2018-01-27 | Discharge: 2018-01-27 | Disposition: A | Discharge status: Home / Self Care | Payer: Medicare Other | Source: Ambulatory Visit | Attending: Urology | Admitting: Urology

## 2018-01-27 ENCOUNTER — Other Ambulatory Visit: Payer: Self-pay

## 2018-01-27 ENCOUNTER — Encounter: Payer: Self-pay | Admitting: Urology

## 2018-01-27 VITALS — BP 134/62 | HR 88 | Temp 98.7°F | Resp 18 | Ht 61.0 in | Wt 129.0 lb

## 2018-01-27 DIAGNOSIS — Z79899 Other long term (current) drug therapy: Secondary | ICD-10-CM | POA: Insufficient documentation

## 2018-01-27 DIAGNOSIS — C3412 Malignant neoplasm of upper lobe, left bronchus or lung: Secondary | ICD-10-CM

## 2018-01-27 DIAGNOSIS — C7951 Secondary malignant neoplasm of bone: Secondary | ICD-10-CM | POA: Insufficient documentation

## 2018-01-27 DIAGNOSIS — R590 Localized enlarged lymph nodes: Secondary | ICD-10-CM | POA: Insufficient documentation

## 2018-01-27 DIAGNOSIS — C7931 Secondary malignant neoplasm of brain: Secondary | ICD-10-CM

## 2018-01-27 DIAGNOSIS — G620 Drug-induced polyneuropathy: Secondary | ICD-10-CM | POA: Diagnosis not present

## 2018-01-27 DIAGNOSIS — T451X5A Adverse effect of antineoplastic and immunosuppressive drugs, initial encounter: Secondary | ICD-10-CM | POA: Diagnosis not present

## 2018-01-27 DIAGNOSIS — Z923 Personal history of irradiation: Secondary | ICD-10-CM | POA: Insufficient documentation

## 2018-01-27 DIAGNOSIS — E279 Disorder of adrenal gland, unspecified: Secondary | ICD-10-CM | POA: Diagnosis not present

## 2018-01-27 DIAGNOSIS — C3490 Malignant neoplasm of unspecified part of unspecified bronchus or lung: Secondary | ICD-10-CM | POA: Diagnosis present

## 2018-01-27 NOTE — Progress Notes (Signed)
Radiation Oncology         [phone] ________________________________  Name: Jacqueline Kidd MRN: [id number]  Date: 01/27/2018  DOB: 11/17/1940  Post Treatment Note  CC: [name], MD  [name], MD  Diagnosis:   77 y.o. female with stage IV (T2b, N3, M1 C) non-small cell lung cancer, squamous cell carcinoma of the left upper lung with neck and mediastinal nodal disease and brain metastases.   Interval Since Last Radiation:  3 months  1.   10/22/2017:  Brain PTV3-5 // 20 Gy in 1 fraction  1. PTV3: Right Temporal 27mm  2. PTV4: Left Temporal 35mm  3. PTV5: Right Occipital 21mm  2.  Chest radiation 03/23/2017 - 05/06/2017- The primary tumor and involved mediastinal adenopathy were treated to 66 Gy in 33 fractions of 2 Gy.  3.  SRS on 04/03/17: Brain PTV1-2 // 20 Gy in 1 fraction.  1.  PTV1: Left cerebellar 12 mm target  2.  PTV2: Left insular 7 mm target    Narrative:  The patient returns today for routine 3 month follow-up since completion of recent SRS to 3 new brain lesions.  She tolerated treatment well and is currently without complaints.  In summary, she initially presented to her PCP, Dr. Evette Doffing, back in June 2018 with complaints of hoarseness of her voice.  On evaluation with CT of the neck, she was found to have a 5.5 x 5.2 x 4.7 cm left upper lobe lung mass with associated left hilar and mediastinal lymphadenopathy likely impacting the left recurrent laryngeal nerve. There was also laterally displaced left true vocal cord and irregularity of the left false cord, likely related to vocal cord paralysis associated with the mediastinal tumor.  She then had CT scan of the chest on 02/23/2017 which showed a large left upper lobe mass extending to the pleural surface of the mediastinum at the level of the AP window, suspicious for mediastinal invasion. There was irregular metastatic left lower paratracheal and AP window adenopathy as well as subcarinal adenopathy.  PET scan was  performed on 17-May-202018, confirming stage IIIB(T2b, N3, M0)  lung cancer.  MRI of the brain was performed 03/05/18 revealing two subcentimeter intracranial metastases within the LEFT temporal lobe and LEFT cerebellum.  She had a CT-guided core biopsy of the left upper lobe lung mass on 03/18/17 and the final pathology ([id number]) was consistent with squamous cell carcinoma.  She elected to proceed with palliative radiation treatments to the chest which were completed between 03/23/2017 - 05/06/2017.  Additionally, she completed SRS treatment for the two brain metastases on 04/03/2017.  She tolerated her treatments well. She was started on palliative systemic chemotherapy with carboplatin and paclitaxel every 3 weeks- first dose on 04/15/2017.  Repeat CT scan of the chest, abdomen and pelvis performed 06/19/18 showed reduction in the left upper lobe lung mass as well as the mediastinal lymphadenopathy with no new or progressive disease in the chest, abdomen or pelvis. Follow up MRI brain on 07/10/17 showed an excellent response to treatment with decreased size of both treated lesions and no evidence of new disease.   She completed 6 cycles of palliative chemotherapy on 07/29/17 and went on observation.  A repeat CT Chest on 08/12/17 showed no concerning findings for disease progression.  Unfortunately, a follow-up brain MRI on 10/08/2017 revealed 3 new subcentimeter metastases with a 4 mm lesion in the RIGHT inferior temporal lobe, a 2 mm lesion in the right occipital lobe and a 4 mm  lesion in the left temporal lobe without significant mass-effect or surrounding edema.  There was also noted interval growth of the previously treated lesion in the left insula but felt related to treatment effect.  She elected to proceed with stereotactic radiosurgery to the 3 new lesions in the brain and this was completed on 10/22/17 and tolerated well.  She has remained on observation only and her most recent repeat systemic imaging with  CT scan of the chest, abdomen and pelvis on 11/16/17 showed no concerning findings for disease progression except for slightly increased size of a right adrenal lesion.  She is due for repeat systemic imaging in 02/2018.  She had a recent follow up MRI brain on 01/21/18 which showed three new subcentimeter brain metastases (3 mm anterior left cerebellar lesion, 4 mm left occipital lobe lesion and an adjacent 3 mm left occipital lobe lesion) with only mild edema at most.  There was increased size of a treated left insular metastasis with increased edema but all other previously treated lesions appeared stable to smaller. Additionally noted were new right frontal and right parietal skull metastases.                   REVIEW OF SYSTEMS:  On review of systems, the patient states that she has been doing fairly well overall.  She denies recent fevers, chills, chest pain, increased shortness of breath, cough or hemoptysis.  She denies dysphagia and though she continues with a decreased appetite she is maintaining her weight. She has persistent chemotherapy induced neuropathy with numbness and tingling in her fingers/hands bilaterally as well as burning pains in the lower extremities bilaterally.  She has continued taking neurontin with some improvement in sxs.  She denies headaches, visual or auditory changes, tinnitus, dizziness, tremors, focal weakness, seizure activity or imbalance.   ALLERGIES:  is allergic to fosamax [alendronate]; hydrocodone-acetaminophen; and lisinopril-hydrochlorothiazide.  Meds: Current Outpatient Medications  Medication Sig Dispense Refill  . gabapentin (NEURONTIN) 400 MG capsule Take 2 capsules (800 mg total) by mouth 3 (three) times daily. 270 capsule 3  . LORazepam (ATIVAN) 0.5 MG tablet Take 1 tablet (0.5 mg total) by mouth as needed for anxiety (take one tablet 30 minutes prior to MRI scans and radiation treatment). 10 tablet 0  . Omega-3 Fatty Acids (FISH OIL) 1000 MG CAPS Take  1,000 mg by mouth every morning.    Marland Kitchen acetaminophen (TYLENOL) 325 MG tablet Take 650 mg by mouth every 6 (six) hours as needed for mild pain or moderate pain.     . Albuterol Sulfate 108 (90 Base) MCG/ACT AEPB Inhale 2 puffs into the lungs every 6 (six) hours as needed. (Patient not taking: Reported on 01/27/2018) 1 each 0  . cholecalciferol (VITAMIN D) 1000 units tablet Take 1,000 Units by mouth daily.     No current facility-administered medications for this encounter.     Physical Findings:  height is 5\' 1"  (1.549 m) and weight is 129 lb (58.5 kg). Her oral temperature is 98.7 F (37.1 C). Her blood pressure is 134/62 and her pulse is 88. Her respiration is 18 and oxygen saturation is 95%.  Pain Assessment Pain Score: 9  Pain Loc: Hand(Hands and feet)/10 In general this is a well appearing caucasian female in no acute distress. She's alert and oriented x4 and appropriate throughout the examination. Cardiopulmonary assessment is negative for acute distress and she exhibits normal effort. She appears grossly neurologically intact. EOMs are intact bilaterally and PERRLA.  Strength  is 5 out of 5 and equal bilaterally in the upper and lower extremities.  Sensation is intact to light touch in the upper and lower extremities.  Lab Findings: Lab Results  Component Value Date   WBC 4.6 11/16/2017   HGB 11.6 11/16/2017   HCT 36.2 11/16/2017   MCV 108.4 (H) 11/16/2017   PLT 150 11/16/2017     Radiographic Findings: Mr [name] CB Contrast  Result Date: 01/21/2018 CLINICAL DATA:  Restaging of metastatic lung cancer. SRS to right temporal, left temporal, and right occipital lesions on 10/22/2017. SRS to left cerebellar and left insular lesions on 04/03/2017. Creatinine was obtained on site at [facility] at [address] Results: Creatinine 0.5 mg/dL. EXAM: MRI HEAD WITHOUT AND WITH CONTRAST TECHNIQUE: Multiplanar, multiecho pulse sequences of the brain and surrounding structures were  obtained without and with intravenous contrast. CONTRAST:  54mL MULTIHANCE GADOBENATE DIMEGLUMINE 529 MG/ML IV SOLN COMPARISON:  10/08/2017 FINDINGS: BRAIN New Lesions: 1. 3 mm anterior left cerebellum (series 10, image 44) 2. 4 mm left occipital lobe with mild edema (series 10, image 52) 3. 3 mm left occipital lobe immediately adjacent to #2 (series 10, image 54) Larger lesions: 1. 14 mm posterior left insula with increased, moderate edema (previously 5 mm) Stable or Smaller lesions: 1. 5 mm left cerebellum, unchanged (series 10, image 29) 2. 3 mm right occipital lobe, unchanged (series 10, image 68) 3. 4 mm right temporal lobe, unchanged (series 10, image 69) 4. 6 mm left temporal lobe, unchanged in size though with new minimal edema (series 10, image 73) Other Brain findings: There is no evidence of acute infarct, intracranial hemorrhage, midline shift, or extra-axial fluid collection. Moderate cerebral atrophy is again noted. Patchy to confluent cerebral white matter T2 hyperintensities separate from the areas of vasogenic edema are similar to the prior study and nonspecific but compatible with moderate chronic small vessel ischemic disease. Mild chronic small vessel changes are also present in the pons, and there are unchanged chronic lacunar infarcts in the left thalamus and left lentiform nucleus. Vascular: Major intracranial vascular flow voids are preserved. Skull and upper cervical spine: 1.6 cm right parietal skull lesion extending through the outer greater than inner tables with scalp edema and with only a punctate focus of signal abnormality previously present (series 7, image 35). New 5 mm right frontal skull lesion (series 7, image 45). Sinuses/Orbits: Bilateral cataract extraction. Paranasal sinuses and mastoid air cells are clear. Other: None. IMPRESSION: 1. Three new subcentimeter brain metastases as above with only at most mild edema. 2. Increased size of treated left insular metastasis with  increased edema. 3. Unchanged size of other treated metastases. 4. New right frontal and right parietal skull metastases. Electronically Signed   By: Logan Bores M.D.   On: 01/21/2018 16:32    Impression/Plan: 1. 77 y.o. female with stage IV (T2b, N3, M1 C) NSCLC, squamous cell carcinoma of the left upper lung with neck and mediastinal nodal disease and brain metastases.   Unfortunately, her most recent MRI brain shows 3 new brain mets and interval enlargement of a previously treated left insular metastasis.  She remains stable clinically.  We reviewed her MRI findings today and discussed the natural history of metastatic squamous cell carcinoma with metastases to the brain and general treatment, highlighting the role of radiotherapy in the management.  Consensus recommendation at recent multidisciplinary brain conference was to proceed with stereotactic radiosurgery [facility]) treatment of the 3 new lesions and consideration for  re-treatment of the left insular lesion.  We discussed the available radiation techniques, and focused on the details of logistics and delivery.  She tolerated her prior SRS treatments very well.  The recommendation is to proceed with SRS treatment to the 3 new lesions delivered in a single fraction.  We reviewed the anticipated acute and late sequelae associated with radiation in this setting. The patient was encouraged to ask questions that were answered to her satisfaction.   At the conclusion of our conversation, the patient elects to proceed with stereotactic radiosurgery to the 3 new lesions in the brain and consideration for re-treatment of the left insular lesion.  She has freely signed written consent today in the office and a copy of this document is placed in her medical record.  We will proceed with CT simulation on Friday 12/30/17 in anticipation of her [facility] treatment scheduled for Friday 02/05/18.  She has a Rx for Ativan 0.5mg  to be used 30 minutes prior to her [place] treatment  which has helped greatly in the past.     [name], PA-C

## 2018-01-28 NOTE — Progress Notes (Signed)
Has armband been applied?  Yes.    Does patient have an allergy to IV contrast dye?: No.   Has patient ever received premedication for IV contrast dye?: No.   Does patient take metformin?: No.  If patient does take metformin when was the last dose: N/A  Date of lab work: 01/21/2018 BUN: 12 CR: 0.5  IV site: left antecubital  Has IV site been added to flowsheet?  Yes.

## 2018-01-29 ENCOUNTER — Ambulatory Visit
Admission: RE | Admit: 2018-01-29 | Discharge: 2018-01-29 | Disposition: A | Discharge status: Home / Self Care | Payer: Medicare Other | Source: Ambulatory Visit | Attending: Radiation Oncology | Admitting: Radiation Oncology

## 2018-01-29 ENCOUNTER — Encounter: Payer: Self-pay | Admitting: Radiation Oncology

## 2018-01-29 ENCOUNTER — Other Ambulatory Visit: Payer: Self-pay

## 2018-01-29 VITALS — BP 103/59 | HR 86 | Temp 97.5°F | Resp 18 | Ht 61.0 in | Wt 128.4 lb

## 2018-01-29 DIAGNOSIS — C7931 Secondary malignant neoplasm of brain: Secondary | ICD-10-CM | POA: Diagnosis not present

## 2018-01-29 DIAGNOSIS — C3412 Malignant neoplasm of upper lobe, left bronchus or lung: Secondary | ICD-10-CM | POA: Diagnosis not present

## 2018-01-29 MED ORDER — SODIUM CHLORIDE 0.9% FLUSH: 10.0000 mL | Freq: Once | INTRAVENOUS | Status: DC

## 2018-01-29 NOTE — Progress Notes (Addendum)
Has armband been applied?  Yes  Does patient have an allergy to IV contrast dye?: No   Has patient ever received premedication for IV contrast dye?: Ativan 0.5 mg at 11 am    Does patient take metformin?: No  If patient does take metformin when was the last dose: N/A  Date of lab work: 01/21/2018 BUN: 12 CR: 0.5  IV site: Left antebutial  Has IV site been added to flowsheet?  Yes  BP (!) 103/59 (BP Location: Right Arm, Patient Position: Sitting, Cuff Size: Normal)   Pulse 86   Temp (!) 97.5 F (36.4 C) (Oral)   Resp 18   Ht 5\' 1"  (1.549 m)   Wt 128 lb 6.4 oz (58.2 kg)   SpO2 98%   BMI 24.26 kg/m    1233 Sim notified that Mrs. Sundberg is ready for her procedure.

## 2018-01-29 NOTE — Progress Notes (Signed)
  Radiation Oncology         [phone] ________________________________  Name: Jacqueline Kidd MRN: [id number]  Date: 01/29/2018  DOB: April 26, 1941  SIMULATION AND TREATMENT PLANNING NOTE    ICD-10-CM   1. 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  Identity was confirmed.  All relevant records and images related to the planned course of therapy were reviewed.  The patient freely provided informed written consent to proceed with treatment after reviewing the details related to the planned course of therapy. The consent form was witnessed and verified by the simulation staff. Intravenous access was established for contrast administration. Then, the patient was set-up in a stable reproducible supine position for radiation therapy.  A relocatable thermoplastic stereotactic head frame was fabricated for precise immobilization.  CT images were obtained.  Surface markings were placed.  The CT images were loaded into the planning software and fused with the patient's targeting MRI scan.  Then the target and avoidance structures were contoured.  Treatment planning then occurred.  The radiation prescription was entered and confirmed.  I have requested 3D planning  I have requested a DVH of the following structures: Brain stem, brain, left eye, right eye, lenses, optic chiasm, target volumes, uninvolved brain, and normal tissue.    SPECIAL TREATMENT PROCEDURE:  The planned course of therapy using radiation constitutes a special treatment procedure. Special care is required in the management of this patient for the following reasons. This treatment constitutes a Special Treatment Procedure for the following  reason: High dose per fraction requiring special monitoring for increased toxicities of treatment including daily imaging.  The special nature of the planned course of radiotherapy will require increased physician supervision and oversight to ensure patient's safety with optimal treatment outcomes.  PLAN:  The patient will receive 20 Gy in 1 fraction.  ________________________________  Sheral Apley [name], M.D.    This document serves as a record of services personally performed by Tyler Pita MD. It was created on his behalf by Delton Coombes, a trained medical scribe. The creation of this record is based on the scribe's personal observations and the provider's statements to them.

## 2018-02-04 DIAGNOSIS — C7931 Secondary malignant neoplasm of brain: Secondary | ICD-10-CM | POA: Diagnosis not present

## 2018-02-04 DIAGNOSIS — Z510 Encounter for antineoplastic radiation therapy: Secondary | ICD-10-CM | POA: Insufficient documentation

## 2018-02-04 DIAGNOSIS — C3412 Malignant neoplasm of upper lobe, left bronchus or lung: Secondary | ICD-10-CM | POA: Diagnosis not present

## 2018-02-05 ENCOUNTER — Encounter: Payer: Self-pay | Admitting: Radiation Oncology

## 2018-02-05 ENCOUNTER — Ambulatory Visit
Admission: RE | Admit: 2018-02-05 | Discharge: 2018-02-05 | Disposition: A | Discharge status: Home / Self Care | Payer: Medicare Other | Source: Ambulatory Visit | Attending: Radiation Oncology | Admitting: Radiation Oncology

## 2018-02-05 VITALS — BP 130/80 | HR 96 | Temp 97.6°F | Resp 18

## 2018-02-05 DIAGNOSIS — C7931 Secondary malignant neoplasm of brain: Secondary | ICD-10-CM

## 2018-02-05 DIAGNOSIS — Z510 Encounter for antineoplastic radiation therapy: Secondary | ICD-10-CM | POA: Diagnosis not present

## 2018-02-05 DIAGNOSIS — C3412 Malignant neoplasm of upper lobe, left bronchus or lung: Secondary | ICD-10-CM | POA: Diagnosis not present

## 2018-02-05 NOTE — Progress Notes (Signed)
  Radiation Oncology         [phone] ________________________________  Stereotactic Treatment Procedure Note  Name: Jacqueline Kidd MRN: [id number]  Date: 02/05/2018  DOB: 04-09-1941  SPECIAL TREATMENT PROCEDURE    ICD-10-CM   1. Brain metastases ([place]) C79.31     3D TREATMENT PLANNING AND DOSIMETRY:  The patient's radiation plan was reviewed and approved by neurosurgery and radiation oncology prior to treatment.  It showed 3-dimensional radiation distributions overlaid onto the planning CT/MRI image set.  The [facility] for the target structures as well as the organs at risk were reviewed. The documentation of the 3D plan and dosimetry are filed in the radiation oncology EMR.  NARRATIVE:  Jacqueline Kidd was brought to the TrueBeam stereotactic radiation treatment machine and placed supine on the CT couch. The head frame was applied, and the patient was set up for stereotactic radiosurgery.  Neurosurgery was present for the set-up and delivery  SIMULATION VERIFICATION:  In the couch zero-angle position, the patient underwent Exactrac imaging using the Brainlab system with orthogonal KV images.  These were carefully aligned and repeated to confirm treatment position for each of the isocenters.  The Exactrac snap film verification was repeated at each couch angle.  PROCEDURE: Jacqueline Kidd received stereotactic radiosurgery to the following targets: Left cerebellar target was treated using 2 Dynamic Conformal Arcs to a prescription dose of 20 Gy.  ExacTrac registration was performed for each couch angle.  The 100% isodose line was prescribed. Two left occipital targets were treated using 4 Rapid Arc VMAT Beams to a prescription dose of 20 Gy.  ExacTrac registration was performed for each couch angle.  The 100% isodose line was prescribed.  STEREOTACTIC TREATMENT MANAGEMENT:  Following delivery, the patient was transported to nursing in stable condition and monitored for possible acute effects.  Vital  signs were recorded BP 130/80   Pulse 96   Temp 97.6 F (36.4 C) (Oral)   Resp 18   SpO2 96% . The patient tolerated treatment without significant acute effects, and was discharged to home in stable condition.    PLAN: Follow-up in one month.  ________________________________  Sheral Apley. [name], M.D.   This document serves as a record of services personally performed by Tyler Pita MD. It was created on his behalf by Delton Coombes, a trained medical scribe. The creation of this record is based on the scribe's personal observations and the provider's statements to them.

## 2018-02-05 NOTE — Op Note (Signed)
Stereotactic Radiosurgery Operative Note  Name: Jacqueline Kidd MRN: [id number]  Date: 02/05/2018  DOB: 06-03-1941  Op Note  Pre Operative Diagnosis:  Metastatic lung cancer with multiple brain metastases  Post Operative Diagnois:  Metastatic lung cancer with multiple brain metastases  3D TREATMENT PLANNING AND DOSIMETRY:  The patient's radiation plan was reviewed and approved by myself (neurosurgery) and Dr. Ledon Snare (radiation oncology) prior to treatment.  It showed 3-dimensional radiation distributions overlaid onto the planning CT/MRI image set.  The [facility] for the target structures as well as the organs at risk were reviewed. The documentation of the 3D plan and dosimetry are filed in the radiation oncology EMR.  NARRATIVE:  Jacqueline Kidd was brought to the TrueBeam stereotactic radiation treatment machine and placed supine on the CT couch. The head frame was applied, and the patient was set up for stereotactic radiosurgery.  I was present for the set-up and delivery.  SIMULATION VERIFICATION:  In the couch zero-angle position, the patient underwent Exactrac imaging using the Brainlab system with orthogonal KV images.  These were carefully aligned and repeated to confirm treatment position for each of the isocenters.  The Exactrac snap film verification was repeated at each couch angle.  SPECIAL TREATMENT PROCEDURE: Jacqueline Kidd received stereotactic radiosurgery to the following targets: Left cerebellar target was treated using 2 Dynamic Conformal Arcs to a prescription dose of 20 Gy.  ExacTrac registration was performed for each couch angle.  The 100% isodose line was prescribed. Two left occipital targets were treated using 4 Rapid Arc VMAT Beams to a prescription dose of 20 Gy.  ExacTrac registration was performed for each couch angle.  The 100% isodose line was prescribed.  STEREOTACTIC TREATMENT MANAGEMENT:  Following delivery, the patient was transported to nursing in stable condition  and monitored for possible acute effects.  Vital signs were recorded. The patient tolerated treatment without significant acute effects, and was discharged to home in stable condition.    PLAN: Follow-up in one month.

## 2018-02-05 NOTE — Progress Notes (Signed)
Jacqueline Kidd   arrived with Jacqueline Kidd ambulatory post [facility] to brain @ 1330. Vital signs taken  Monitor for 30 minutes alert and oriented x 3, denies headache, dizziness or any visual problems , fatigue or pain. Patient did not have any slurred speech or ataxia.Jacqueline Kidd verbalized understanding to call for any unusual symptoms, increased head ache that won't go away, increased fever > 100.5, increased vomiting, vision changes.  Understands to avoid strenuous activity for the next 24 hours and call [phone] with needs.   Vital signs were taken @ 1355  Alert and oriented x 3, denies headache, dizziness,nausea or vomiting,or visual changes. Patient denies any ataxia or slurred speech..   Jacqueline Kidd was  discharged home with husband ambulatory upstairs to waiting area for the car. Vitals:   02/05/18 1336 02/05/18 1359  BP: 132/81 130/80  Pulse: 100 96  Resp: 18 18  Temp: 97.6 F (36.4 C) 97.6 F (36.4 C)  TempSrc: Oral Oral  SpO2: 95% 96%

## 2018-02-10 NOTE — Progress Notes (Signed)
  Radiation Oncology         ([phone] ________________________________  Name: Jacqueline Kidd MRN: [id number]  Date: 02/05/2018  DOB: [birth year]  End of Treatment Note  Diagnosis:   77 y.o. female with squamous cell carcinomaof the left upper lung with three new subcentimeter brain metastases(3 mm anterior left cerebellar lesion,4 mm left occipital lobelesion and an adjacent3 mm left occipital lobelesion)  Indication for treatment:  palliative       Radiation treatment dates:   02/05/2018  Site/dose:    1. Brain PTV6: Anterior Left Cerebellum 5mm // 20 Gy in 1 fraction, Max dose=119.4% 2. Brain PTV7-8: Left Occipital 5mm, Left Occipital 61mm // 20 Gy in 1 fraction, max dose=120.9%  Beams/energy:    1. ExacTrac SBRT/SRT-3D, 2 Kevil fields // 6FFF Photon 2. ExacTrac SBRT/SRT-VMAT, 4 VMAT fields // 6FFF Photon  Narrative: The patient tolerated radiation treatment well.   There were no signs of acute toxicity after treatment.  Plan: The patient has completed radiation treatment. The patient will return to radiation oncology clinic for routine followup in one month. I advised the patient to call or return sooner if they have any questions or concerns related to their recovery or treatment. ________________________________   Tyler Pita, MD  This document serves as a record of services personally performed by Tyler Pita, MD. It was created on his behalf by Rae Lips, a trained medical scribe. The creation of this record is based on the scribe's personal observations and the provider's statements to them. This document has been checked and approved by the attending provider.

## 2018-02-15 ENCOUNTER — Inpatient Hospital Stay: Payer: Medicare Other | Attending: Internal Medicine

## 2018-02-15 ENCOUNTER — Ambulatory Visit (HOSPITAL_COMMUNITY)
Admission: RE | Admit: 2018-02-15 | Discharge: 2018-02-15 | Disposition: A | Discharge status: Home / Self Care | Payer: Medicare Other | Source: Ambulatory Visit | Attending: Internal Medicine | Admitting: Internal Medicine

## 2018-02-15 DIAGNOSIS — Z87891 Personal history of nicotine dependence: Secondary | ICD-10-CM | POA: Diagnosis not present

## 2018-02-15 DIAGNOSIS — J90 Pleural effusion, not elsewhere classified: Secondary | ICD-10-CM | POA: Insufficient documentation

## 2018-02-15 DIAGNOSIS — R05 Cough: Secondary | ICD-10-CM | POA: Insufficient documentation

## 2018-02-15 DIAGNOSIS — M438X5 Other specified deforming dorsopathies, thoracolumbar region: Secondary | ICD-10-CM | POA: Diagnosis not present

## 2018-02-15 DIAGNOSIS — Z5112 Encounter for antineoplastic immunotherapy: Secondary | ICD-10-CM | POA: Diagnosis not present

## 2018-02-15 DIAGNOSIS — E279 Disorder of adrenal gland, unspecified: Secondary | ICD-10-CM | POA: Insufficient documentation

## 2018-02-15 DIAGNOSIS — C3412 Malignant neoplasm of upper lobe, left bronchus or lung: Secondary | ICD-10-CM | POA: Diagnosis not present

## 2018-02-15 DIAGNOSIS — K8020 Calculus of gallbladder without cholecystitis without obstruction: Secondary | ICD-10-CM | POA: Diagnosis not present

## 2018-02-15 DIAGNOSIS — I700 Atherosclerosis of aorta: Secondary | ICD-10-CM | POA: Diagnosis not present

## 2018-02-15 DIAGNOSIS — L989 Disorder of the skin and subcutaneous tissue, unspecified: Secondary | ICD-10-CM | POA: Insufficient documentation

## 2018-02-15 DIAGNOSIS — R0609 Other forms of dyspnea: Secondary | ICD-10-CM | POA: Insufficient documentation

## 2018-02-15 DIAGNOSIS — C7931 Secondary malignant neoplasm of brain: Secondary | ICD-10-CM | POA: Diagnosis not present

## 2018-02-15 DIAGNOSIS — C3490 Malignant neoplasm of unspecified part of unspecified bronchus or lung: Secondary | ICD-10-CM

## 2018-02-15 DIAGNOSIS — C3492 Malignant neoplasm of unspecified part of left bronchus or lung: Secondary | ICD-10-CM | POA: Diagnosis not present

## 2018-02-15 DIAGNOSIS — E278 Other specified disorders of adrenal gland: Secondary | ICD-10-CM | POA: Diagnosis not present

## 2018-02-15 DIAGNOSIS — R5383 Other fatigue: Secondary | ICD-10-CM | POA: Insufficient documentation

## 2018-02-15 DIAGNOSIS — F329 Major depressive disorder, single episode, unspecified: Secondary | ICD-10-CM | POA: Diagnosis not present

## 2018-02-15 DIAGNOSIS — Q278 Other specified congenital malformations of peripheral vascular system: Secondary | ICD-10-CM | POA: Insufficient documentation

## 2018-02-15 DIAGNOSIS — G620 Drug-induced polyneuropathy: Secondary | ICD-10-CM | POA: Diagnosis not present

## 2018-02-15 DIAGNOSIS — Z5111 Encounter for antineoplastic chemotherapy: Secondary | ICD-10-CM | POA: Diagnosis not present

## 2018-02-15 LAB — CMP (CANCER CENTER ONLY)
ALK PHOS: 103 U/L (ref 38–126)
ALT: 13 U/L (ref 0–44)
ANION GAP: 8 (ref 5–15)
AST: 22 U/L (ref 15–41)
Albumin: 3.5 g/dL (ref 3.5–5.0)
BUN: 7 mg/dL — ABNORMAL LOW (ref 8–23)
CALCIUM: 9.2 mg/dL (ref 8.9–10.3)
CO2: 27 mmol/L (ref 22–32)
CREATININE: 0.71 mg/dL (ref 0.44–1.00)
Chloride: 103 mmol/L (ref 98–111)
Glucose, Bld: 90 mg/dL (ref 70–99)
Potassium: 4.2 mmol/L (ref 3.5–5.1)
Sodium: 138 mmol/L (ref 135–145)
TOTAL PROTEIN: 8.1 g/dL (ref 6.5–8.1)
Total Bilirubin: 0.9 mg/dL (ref 0.3–1.2)

## 2018-02-15 LAB — CBC WITH DIFFERENTIAL (CANCER CENTER ONLY)
BASOS ABS: 0 10*3/uL (ref 0.0–0.1)
BASOS PCT: 0 %
Eosinophils Absolute: 0.1 10*3/uL (ref 0.0–0.5)
Eosinophils Relative: 2 %
HEMATOCRIT: 38.7 % (ref 34.8–46.6)
Hemoglobin: 12.5 g/dL (ref 11.6–15.9)
Lymphocytes Relative: 35 %
Lymphs Abs: 2.1 10*3/uL (ref 0.9–3.3)
MCH: 34.2 pg — ABNORMAL HIGH (ref 25.1–34.0)
MCHC: 32.3 g/dL (ref 31.5–36.0)
MCV: 106 fL — ABNORMAL HIGH (ref 79.5–101.0)
MONO ABS: 0.5 10*3/uL (ref 0.1–0.9)
Monocytes Relative: 8 %
NEUTROS ABS: 3.3 10*3/uL (ref 1.5–6.5)
NEUTROS PCT: 55 %
Platelet Count: 168 10*3/uL (ref 145–400)
RBC: 3.65 MIL/uL — AB (ref 3.70–5.45)
RDW: 13.6 % (ref 11.2–14.5)
WBC: 5.9 10*3/uL (ref 3.9–10.3)

## 2018-02-15 MED ORDER — IOHEXOL 300 MG/ML  SOLN
100.0000 mL | Freq: Once | INTRAMUSCULAR | Status: AC | PRN
  Administered 2018-02-15: 100 mL via INTRAVENOUS

## 2018-02-18 ENCOUNTER — Inpatient Hospital Stay (HOSPITAL_BASED_OUTPATIENT_CLINIC_OR_DEPARTMENT_OTHER): Payer: Medicare Other | Admitting: Internal Medicine

## 2018-02-18 ENCOUNTER — Encounter: Payer: Self-pay | Admitting: Internal Medicine

## 2018-02-18 ENCOUNTER — Telehealth: Payer: Self-pay | Admitting: Internal Medicine

## 2018-02-18 ENCOUNTER — Other Ambulatory Visit: Payer: Medicare Other

## 2018-02-18 VITALS — BP 130/79 | HR 88 | Temp 98.2°F | Resp 18 | Ht 61.0 in | Wt 129.0 lb

## 2018-02-18 VITALS — Wt 129.3 lb

## 2018-02-18 DIAGNOSIS — J90 Pleural effusion, not elsewhere classified: Secondary | ICD-10-CM

## 2018-02-18 DIAGNOSIS — Z87891 Personal history of nicotine dependence: Secondary | ICD-10-CM | POA: Diagnosis not present

## 2018-02-18 DIAGNOSIS — G620 Drug-induced polyneuropathy: Secondary | ICD-10-CM

## 2018-02-18 DIAGNOSIS — G629 Polyneuropathy, unspecified: Secondary | ICD-10-CM

## 2018-02-18 DIAGNOSIS — R0609 Other forms of dyspnea: Secondary | ICD-10-CM

## 2018-02-18 DIAGNOSIS — F329 Major depressive disorder, single episode, unspecified: Secondary | ICD-10-CM

## 2018-02-18 DIAGNOSIS — R05 Cough: Secondary | ICD-10-CM

## 2018-02-18 DIAGNOSIS — C3412 Malignant neoplasm of upper lobe, left bronchus or lung: Secondary | ICD-10-CM | POA: Diagnosis not present

## 2018-02-18 DIAGNOSIS — L989 Disorder of the skin and subcutaneous tissue, unspecified: Secondary | ICD-10-CM

## 2018-02-18 DIAGNOSIS — C7931 Secondary malignant neoplasm of brain: Secondary | ICD-10-CM

## 2018-02-18 DIAGNOSIS — E279 Disorder of adrenal gland, unspecified: Secondary | ICD-10-CM | POA: Diagnosis not present

## 2018-02-18 DIAGNOSIS — G622 Polyneuropathy due to other toxic agents: Secondary | ICD-10-CM

## 2018-02-18 DIAGNOSIS — R5382 Chronic fatigue, unspecified: Secondary | ICD-10-CM

## 2018-02-18 DIAGNOSIS — T451X5A Adverse effect of antineoplastic and immunosuppressive drugs, initial encounter: Secondary | ICD-10-CM

## 2018-02-18 DIAGNOSIS — R5383 Other fatigue: Secondary | ICD-10-CM

## 2018-02-18 DIAGNOSIS — Z5112 Encounter for antineoplastic immunotherapy: Secondary | ICD-10-CM | POA: Diagnosis not present

## 2018-02-18 DIAGNOSIS — Z7189 Other specified counseling: Secondary | ICD-10-CM

## 2018-02-18 MED ORDER — GABAPENTIN 400 MG PO CAPS: 800.0000 mg | ORAL_CAPSULE | Freq: Three times a day (TID) | ORAL | 3 refills | Status: DC

## 2018-02-18 MED ORDER — DULOXETINE HCL 30 MG PO CPEP: 30.0000 mg | ORAL_CAPSULE | Freq: Every day | ORAL | 3 refills | Status: DC

## 2018-02-18 NOTE — Progress Notes (Signed)
[place] Telephone:([phone]) [phone]   Fax:([phone]) [place] [address]  DIAGNOSIS: stage IV(T2b, N3, M1c)  lung cancer pending further staging workup and tissue diagnosis. The patient presented with large left upper lobe lung mass in addition to left hilar and mediastinal lymphadenopathy and left true vocal cord paralysis.  PRIOR THERAPY:  1) Palliative radiotherapy to the large left upper lobe lung mass as well as metastatic brain lesions. 2)  systemic chemotherapy with carboplatin for AUC of 5 and paclitaxel 175 MG/M2 every 3 weeks. First dose on 04/15/2017.  Status post 6 cycles.  Paclitaxel has been reduced to 150 mg/M2 starting from cycle #4 secondary to peripheral neuropathy.  Last dose of chemotherapy was given July 29, 2017.  CURRENT THERAPY: Second line treatment with immunotherapy with Nivolumab 480 mg IV every 4 weeks.  First dose of February 25, 2018.  INTERVAL HISTORY: Jacqueline Kidd 77 y.o. female returns to the clinic today for follow-up visit accompanied by HER-2 sons.  The patient is feeling fine today with no specific complaints except for generalized fatigue and the persistent peripheral neuropathy.  She is currently on gabapentin 800 mg p.o. 3 times daily.  She denied having any chest pain but has shortness of breath with exertion with mild cough and no hemoptysis.  She noticed a lump in the right side of her scalp that has been increasing in size recently.  She was found to have also progressive disease in the brain and she is currently managed by Dr. [name].  The patient denied having any nausea, vomiting, diarrhea or constipation.  She denied having any recent weight loss or night sweats.  She had repeat CT scan of the chest, abdomen and pelvis performed recently and she is here for evaluation and discussion of her risk her results.   MEDICAL HISTORY: Past Medical History:  Diagnosis  Date  . Chronic low back pain   . COPD (chronic obstructive pulmonary disease) ([place])   . GERD (gastroesophageal reflux disease)   . Hyperlipidemia   . Hypertension   . Lesion of left lung   . lung ca dx'd 01/2017  . Osteoporosis     ALLERGIES:  is allergic to fosamax [alendronate]; hydrocodone-acetaminophen; and lisinopril-hydrochlorothiazide.  MEDICATIONS:  Current Outpatient Medications  Medication Sig Dispense Refill  . acetaminophen (TYLENOL) 325 MG tablet Take 650 mg by mouth every 6 (six) hours as needed for mild pain or moderate pain.     . Albuterol Sulfate 108 (90 Base) MCG/ACT AEPB Inhale 2 puffs into the lungs every 6 (six) hours as needed. (Patient not taking: Reported on 01/27/2018) 1 each 0  . cholecalciferol (VITAMIN D) 1000 units tablet Take 1,000 Units by mouth daily.    Marland Kitchen gabapentin (NEURONTIN) 400 MG capsule Take 2 capsules (800 mg total) by mouth 3 (three) times daily. 270 capsule 3  . LORazepam (ATIVAN) 0.5 MG tablet Take 1 tablet (0.5 mg total) by mouth as needed for anxiety (take one tablet 30 minutes prior to MRI scans and radiation treatment). 10 tablet 0  . Omega-3 Fatty Acids (FISH OIL) 1000 MG CAPS Take 1,000 mg by mouth every morning.     No current facility-administered medications for this visit.     SURGICAL HISTORY:  Past Surgical History:  Procedure Laterality Date  . ABDOMINAL HYSTERECTOMY    . TOTAL HIP ARTHROPLASTY     right  REVIEW OF SYSTEMS:  Constitutional: positive for fatigue Eyes: negative Ears, nose, mouth, throat, and face: negative Respiratory: positive for cough and dyspnea on exertion Cardiovascular: negative Gastrointestinal: negative Genitourinary:negative Integument/breast: negative Hematologic/lymphatic: negative Musculoskeletal:negative Neurological: negative Behavioral/Psych: negative Endocrine: negative Allergic/Immunologic: negative   PHYSICAL EXAMINATION: General appearance: alert, cooperative, fatigued and no  distress Head: Normocephalic, without obvious abnormality, atraumatic, Palpable 2 cm mass on the right parietal area. Neck: no adenopathy, no JVD, supple, symmetrical, trachea midline and thyroid not enlarged, symmetric, no tenderness/mass/nodules Lymph nodes: Cervical, supraclavicular, and axillary nodes normal. Resp: clear to auscultation bilaterally Back: symmetric, no curvature. ROM normal. No CVA tenderness. Cardio: regular rate and rhythm, S1, S2 normal, no murmur, click, rub or gallop GI: soft, non-tender; bowel sounds normal; no masses,  no organomegaly Extremities: extremities normal, atraumatic, no cyanosis or edema Neurologic: Alert and oriented X 3, normal strength and tone. Normal symmetric reflexes. Normal coordination and gait  ECOG PERFORMANCE STATUS: 1 - Symptomatic but completely ambulatory  Blood pressure 130/79, pulse 88, temperature 98.2 F (36.8 C), temperature source Oral, resp. rate 18, height 5' 1" (1.549 m), weight 129 lb (58.5 kg), SpO2 96 %.  LABORATORY DATA: Lab Results  Component Value Date   WBC 5.9 02/15/2018   HGB 12.5 02/15/2018   HCT 38.7 02/15/2018   MCV 106.0 (H) 02/15/2018   PLT 168 02/15/2018      Chemistry      Component Value Date/Time   NA 138 02/15/2018 1344   NA 138 07/08/2017 0831   K 4.2 02/15/2018 1344   K 3.3 (L) 07/08/2017 0831   CL 103 02/15/2018 1344   CO2 27 02/15/2018 1344   CO2 24 07/08/2017 0831   BUN 7 (L) 02/15/2018 1344   BUN 4.5 (L) 07/08/2017 0831   CREATININE 0.71 02/15/2018 1344   CREATININE 0.6 07/08/2017 0831      Component Value Date/Time   CALCIUM 9.2 02/15/2018 1344   CALCIUM 9.0 07/08/2017 0831   ALKPHOS 103 02/15/2018 1344   ALKPHOS 119 07/08/2017 0831   AST 22 02/15/2018 1344   AST 25 07/08/2017 0831   ALT 13 02/15/2018 1344   ALT 16 07/08/2017 0831   BILITOT 0.9 02/15/2018 1344   BILITOT 0.52 07/08/2017 0831       RADIOGRAPHIC STUDIES: Ct Chest W Contrast  Result Date: 02/15/2018 CLINICAL  DATA:  Stage IV left lung cancer diagnosed in July 2018. Post chemotherapy and radiation therapy. Persistent chest congestion. EXAM: CT CHEST, ABDOMEN, AND PELVIS WITH CONTRAST TECHNIQUE: Multidetector CT imaging of the chest, abdomen and pelvis was performed following the standard protocol during bolus administration of intravenous contrast. CONTRAST:  100mL OMNIPAQUE IOHEXOL 300 MG/ML  SOLN COMPARISON:  CT 11/16/2017 and 08/12/2017. FINDINGS: CT CHEST FINDINGS Cardiovascular: Diffuse atherosclerosis of the aorta, great vessels and coronary arteries again noted. There is an aberrant right subclavian artery, but no evidence of acute vascular finding. The heart size is normal. There is a stable small pericardial effusion. Mediastinum/Nodes: There are no enlarged mediastinal, hilar or axillary lymph nodes. Small subcarinal calcification is stable. The esophageal wall remains somewhat indistinct without focal abnormality. The thyroid gland and trachea appear normal. Lungs/Pleura: Small left pleural effusion has mildly increased in volume. There is a component which appears partially loculated posteriorly. No significant pleural fluid on the right. The irradiated left upper lobe mass does not appear significantly changed as measured in a similar manner, measuring 3.3 x 1.7 cm on image 41/7. There are surrounding densities and bronchial distortion   attributed to prior radiation. The reported paramediastinal right upper lobe density on the most recent study appears less discrete and smaller (images 26 and 27/7). No new or enlarging nodules. There is scattered subpleural reticulation in both lungs. Musculoskeletal/Chest wall: No chest wall mass or suspicious osseous findings. Osteopenia and mild thoracic compression deformities are stable. CT ABDOMEN AND PELVIS FINDINGS Hepatobiliary: The liver is normal in density without focal abnormality. Small calcified gallstones are again noted. No gallbladder wall thickening or  biliary dilatation. Pancreas: Pancreatic atrophy. No evidence of mass lesion, ductal dilatation or surrounding inflammation. Spleen: Scattered calcified granulomas. Normal in size without suspicious lesion. Adrenals/Urinary Tract: Progressive enlargement of right adrenal nodule, new on the most recent study. This measures 2.5 x 1.9 cm on image 50/2. The left adrenal gland appears normal. The kidneys appear normal without evidence of urinary tract calculus, suspicious lesion or hydronephrosis. No bladder abnormalities are seen. The bladder is partly obscured by artifact from the patient's right hip surgery. Stomach/Bowel: No evidence of bowel wall thickening, distention or surrounding inflammatory change. Vascular/Lymphatic: There are no enlarged abdominal or pelvic lymph nodes. Aortic and branch vessel atherosclerosis. Reproductive: Hysterectomy. Probable ovarian tissue bilaterally, stable. No adnexal mass. Other: No evidence of abdominal wall mass or hernia. No ascites. Musculoskeletal: No acute or significant osseous findings. Multiple lumbar compression deformities are stable. Previous right total hip arthroplasty. IMPRESSION: 1. Progressive enlargement of small left pleural effusion. No other significant changes within the chest. The irradiated left upper lobe lesion has not significantly changed. 2. Progressive enlargement of indeterminate right adrenal nodule, not present 6 months ago. This remains concerning for metastatic disease. 3. No other evidence of metastatic disease in the chest, abdomen or pelvis. 4. Stable additional findings including subpleural reticulation in both lungs, aberrant right subclavian artery, cholelithiasis, chronic thoracolumbar compression deformities and Aortic Atherosclerosis (ICD10-I70.0). Electronically Signed   By: Richardean Sale M.D.   On: 02/15/2018 16:12   Mr [name] S[facility] Contrast  Result Date: 01/21/2018 CLINICAL DATA:  Restaging of metastatic lung cancer. SRS to right  temporal, left temporal, and right occipital lesions on 10/22/2017. SRS to left cerebellar and left insular lesions on 04/03/2017. Creatinine was obtained on site at [facility] at [address] Results: Creatinine 0.5 mg/dL. EXAM: MRI HEAD WITHOUT AND WITH CONTRAST TECHNIQUE: Multiplanar, multiecho pulse sequences of the brain and surrounding structures were obtained without and with intravenous contrast. CONTRAST:  67m MULTIHANCE GADOBENATE DIMEGLUMINE 529 MG/ML IV SOLN COMPARISON:  10/08/2017 FINDINGS: BRAIN New Lesions: 1. 3 mm anterior left cerebellum (series 10, image 44) 2. 4 mm left occipital lobe with mild edema (series 10, image 52) 3. 3 mm left occipital lobe immediately adjacent to #2 (series 10, image 54) Larger lesions: 1. 14 mm posterior left insula with increased, moderate edema (previously 5 mm) Stable or Smaller lesions: 1. 5 mm left cerebellum, unchanged (series 10, image 29) 2. 3 mm right occipital lobe, unchanged (series 10, image 68) 3. 4 mm right temporal lobe, unchanged (series 10, image 69) 4. 6 mm left temporal lobe, unchanged in size though with new minimal edema (series 10, image 73) Other Brain findings: There is no evidence of acute infarct, intracranial hemorrhage, midline shift, or extra-axial fluid collection. Moderate cerebral atrophy is again noted. Patchy to confluent cerebral white matter T2 hyperintensities separate from the areas of vasogenic edema are similar to the prior study and nonspecific but compatible with moderate chronic small vessel ischemic disease. Mild chronic small vessel changes are also  present in the pons, and there are unchanged chronic lacunar infarcts in the left thalamus and left lentiform nucleus. Vascular: Major intracranial vascular flow voids are preserved. Skull and upper cervical spine: 1.6 cm right parietal skull lesion extending through the outer greater than inner tables with scalp edema and with only a punctate focus of signal  abnormality previously present (series 7, image 35). New 5 mm right frontal skull lesion (series 7, image 45). Sinuses/Orbits: Bilateral cataract extraction. Paranasal sinuses and mastoid air cells are clear. Other: None. IMPRESSION: 1. Three new subcentimeter brain metastases as above with only at most mild edema. 2. Increased size of treated left insular metastasis with increased edema. 3. Unchanged size of other treated metastases. 4. New right frontal and right parietal skull metastases. Electronically Signed   By: Allen  Grady M.D.   On: 01/21/2018 16:32   Ct Abdomen Pelvis W Contrast  Result Date: 02/15/2018 CLINICAL DATA:  Stage IV left lung cancer diagnosed in July 2018. Post chemotherapy and radiation therapy. Persistent chest congestion. EXAM: CT CHEST, ABDOMEN, AND PELVIS WITH CONTRAST TECHNIQUE: Multidetector CT imaging of the chest, abdomen and pelvis was performed following the standard protocol during bolus administration of intravenous contrast. CONTRAST:  100mL OMNIPAQUE IOHEXOL 300 MG/ML  SOLN COMPARISON:  CT 11/16/2017 and 08/12/2017. FINDINGS: CT CHEST FINDINGS Cardiovascular: Diffuse atherosclerosis of the aorta, great vessels and coronary arteries again noted. There is an aberrant right subclavian artery, but no evidence of acute vascular finding. The heart size is normal. There is a stable small pericardial effusion. Mediastinum/Nodes: There are no enlarged mediastinal, hilar or axillary lymph nodes. Small subcarinal calcification is stable. The esophageal wall remains somewhat indistinct without focal abnormality. The thyroid gland and trachea appear normal. Lungs/Pleura: Small left pleural effusion has mildly increased in volume. There is a component which appears partially loculated posteriorly. No significant pleural fluid on the right. The irradiated left upper lobe mass does not appear significantly changed as measured in a similar manner, measuring 3.3 x 1.7 cm on image 41/7. There  are surrounding densities and bronchial distortion attributed to prior radiation. The reported paramediastinal right upper lobe density on the most recent study appears less discrete and smaller (images 26 and 27/7). No new or enlarging nodules. There is scattered subpleural reticulation in both lungs. Musculoskeletal/Chest wall: No chest wall mass or suspicious osseous findings. Osteopenia and mild thoracic compression deformities are stable. CT ABDOMEN AND PELVIS FINDINGS Hepatobiliary: The liver is normal in density without focal abnormality. Small calcified gallstones are again noted. No gallbladder wall thickening or biliary dilatation. Pancreas: Pancreatic atrophy. No evidence of mass lesion, ductal dilatation or surrounding inflammation. Spleen: Scattered calcified granulomas. Normal in size without suspicious lesion. Adrenals/Urinary Tract: Progressive enlargement of right adrenal nodule, new on the most recent study. This measures 2.5 x 1.9 cm on image 50/2. The left adrenal gland appears normal. The kidneys appear normal without evidence of urinary tract calculus, suspicious lesion or hydronephrosis. No bladder abnormalities are seen. The bladder is partly obscured by artifact from the patient's right hip surgery. Stomach/Bowel: No evidence of bowel wall thickening, distention or surrounding inflammatory change. Vascular/Lymphatic: There are no enlarged abdominal or pelvic lymph nodes. Aortic and branch vessel atherosclerosis. Reproductive: Hysterectomy. Probable ovarian tissue bilaterally, stable. No adnexal mass. Other: No evidence of abdominal wall mass or hernia. No ascites. Musculoskeletal: No acute or significant osseous findings. Multiple lumbar compression deformities are stable. Previous right total hip arthroplasty. IMPRESSION: 1. Progressive enlargement of small left pleural effusion. No   other significant changes within the chest. The irradiated left upper lobe lesion has not significantly  changed. 2. Progressive enlargement of indeterminate right adrenal nodule, not present 6 months ago. This remains concerning for metastatic disease. 3. No other evidence of metastatic disease in the chest, abdomen or pelvis. 4. Stable additional findings including subpleural reticulation in both lungs, aberrant right subclavian artery, cholelithiasis, chronic thoracolumbar compression deformities and Aortic Atherosclerosis (ICD10-I70.0). Electronically Signed   By: William  Veazey M.D.   On: 02/15/2018 16:12    ASSESSMENT AND PLAN: This is a very pleasant 77 years old white female with stage IV (T2b, N3, M1 C) non-small cell lung cancer, squamous cell carcinoma presented with large left upper lobe lung mass in addition to mediastinal and supraclavicular lymphadenopathy as well as metastatic brain lesions diagnosed in September 2018. The patient underwent a course of palliative radiotherapy to the left upper lobe lung mass as well as the metastatic brain lesions. She is currently undergoing palliative systemic chemotherapy with carboplatin for AUC of 5 and paclitaxel 175 mg/M2 every 3 weeks, status post 6 cycles. She has been in observation for several months. Repeat CT scan of the chest, abdomen and pelvis showed evidence for disease progression with enlargement of right adrenal nodule in addition to progressive enlargement of small left pleural effusion in addition to the right parietal scalp lesion.  The patient also was found to have evidence for progressive disease in the brain. I had a lengthy discussion with the patient and her sons today about her current condition and treatment options.  I gave the patient the option of palliative care and hospice referral versus consideration of second line treatment with immunotherapy was Nivolumab 480 MG IV every 4 weeks.  The patient is interested in treatment and she is expected to start the first cycle of this treatment in 1 week.  I discussed with the patient  the adverse effect of this treatment including but not limited to immunotherapy mediated skin rash, diarrhea, interstitial lung disease, liver, renal, thyroid or other endocrine dysfunction. She will come back for follow-up visit in 5 weeks with the start of cycle #2. For the metastatic brain lesions she is followed by Dr. Manning.   For the peripheral neuropathy she will continue on gabapentin.  I will give her a refill of her medication. She was advised to call immediately if she has any concerning symptoms in the interval. The patient voices understanding of current disease status and treatment options and is in agreement with the current care plan. All questions were answered. The patient knows to call the clinic with any problems, questions or concerns. We can certainly see the patient much sooner if necessary.  Disclaimer: This note was dictated with voice recognition software. Similar sounding words can inadvertently be transcribed and may not be corrected upon review.       

## 2018-02-18 NOTE — Telephone Encounter (Signed)
Gave patient avs and calendar of upcoming appts.  °

## 2018-02-18 NOTE — Progress Notes (Signed)
[place] at [place], [place] [address] [phone]   Interval Evaluation  Date of Service: 02/18/18 Patient Name: Jacqueline Kidd Patient MRN: [id number] Patient DOB: 10-Mar-1941 Provider: Ventura Sellers, MD  Identifying Statement:  Jacqueline Kidd is a 77 y.o. female with Brain metastases ([place]) [C79.31], neuropathy  Primary Cancer: [place] Lung Stage IV  CNS Oncology History: 04/03/17: SRS to 2 supratentorial lesions 10/22/17: SRS to 3 (new) supratentorial lesions 02/05/18: SRS to 3 additional sub-centimeter metastases  Interval History:  Jacqueline Kidd presents today after recent SRS.  She continues to have same neuropathic symptoms, maybe slight improvement in the lower legs since increasing dose of Gabapentin.  Same extent of functional impairment.  Unfortunately had progression identified on recent staging CT scans, consideration is being made to initiate immunotherapy given poor tolerance of chemo.  She acknowledges symptoms of depression as well.  Prior: She describes impaired sensation in her fingers, as well as her feet and lower legs.  She doesn't describe frank pain, but there is "pins and needles" component.  Hands feel like "sand or sandpaper".  She has trouble locating where her feet are underneath her.  Symptoms began while undergoing cycles of chemotherapy for lung cancer, notably with platinum and taxol based treatments.  Despite dose reduction and eventual completion of therapy, her symptoms have remained.  She doesn't describe any recent worsening.  Walks on her own but getting around "is harder because of my feet".    Medications: Current Outpatient Medications on File Prior to Visit  Medication Sig Dispense Refill  . acetaminophen (TYLENOL) 325 MG tablet Take 650 mg by mouth every 6 (six) hours as needed for mild pain or moderate pain.     . Albuterol Sulfate 108 (90 Base) MCG/ACT AEPB Inhale 2 puffs into the lungs  every 6 (six) hours as needed. (Patient not taking: Reported on 01/27/2018) 1 each 0  . cholecalciferol (VITAMIN D) 1000 units tablet Take 1,000 Units by mouth daily.    Marland Kitchen gabapentin (NEURONTIN) 400 MG capsule Take 2 capsules (800 mg total) by mouth 3 (three) times daily. 270 capsule 3  . LORazepam (ATIVAN) 0.5 MG tablet Take 1 tablet (0.5 mg total) by mouth as needed for anxiety (take one tablet 30 minutes prior to MRI scans and radiation treatment). (Patient not taking: Reported on 02/18/2018) 10 tablet 0  . Omega-3 Fatty Acids (FISH OIL) 1000 MG CAPS Take 1,000 mg by mouth every morning.     No current facility-administered medications on file prior to visit.     Allergies:  Allergies  Allergen Reactions  . Fosamax [Alendronate]   . Hydrocodone-Acetaminophen Nausea And Vomiting    EXTREME VOMITING   . Lisinopril-Hydrochlorothiazide Other (See Comments)    Chest spasms   Past Medical History:  Past Medical History:  Diagnosis Date  . Chronic low back pain   . COPD (chronic obstructive pulmonary disease) ([place])   . GERD (gastroesophageal reflux disease)   . Hyperlipidemia   . Hypertension   . Lesion of left lung   . lung ca dx'd 01/2017  . Osteoporosis    Past Surgical History:  Past Surgical History:  Procedure Laterality Date  . ABDOMINAL HYSTERECTOMY    . TOTAL HIP ARTHROPLASTY     right   Social History:  Social History   Socioeconomic History  . Marital status: Married    Spouse name: Not on file  . Number of children: Not  on file  . Years of education: Not on file  . Highest education level: Not on file  Occupational History  . Not on file  Social Needs  . Financial resource strain: Not on file  . Food insecurity:    Worry: Not on file    Inability: Not on file  . Transportation needs:    Medical: Not on file    Non-medical: Not on file  Tobacco Use  . Smoking status: Former Smoker    Packs/day: 1.00    Years: 57.00    Pack years: 57.00    Last attempt  to quit: 02/16/2017    Years since quitting: 1.0  . Smokeless tobacco: Never Used  Substance and Sexual Activity  . Alcohol use: No  . Drug use: No  . Sexual activity: Never  Lifestyle  . Physical activity:    Days per week: Not on file    Minutes per session: Not on file  . Stress: Not on file  Relationships  . Social connections:    Talks on phone: Not on file    Gets together: Not on file    Attends religious service: Not on file    Active member of club or organization: Not on file    Attends meetings of clubs or organizations: Not on file    Relationship status: Not on file  . Intimate partner violence:    Fear of current or ex partner: Not on file    Emotionally abused: Not on file    Physically abused: Not on file    Forced sexual activity: Not on file  Other Topics Concern  . Not on file  Social History Narrative  . Not on file   Family History:  Family History  Problem Relation Age of Onset  . Cancer Mother        breast  . Cancer Father        esophageal  . Cancer Sister        breast    Review of Systems: Constitutional: Denies fevers, chills or abnormal weight loss Eyes: Denies blurriness of vision Ears, nose, mouth, throat, and face: Denies mucositis or sore throat Respiratory: Denies cough, dyspnea or wheezes Cardiovascular: Denies palpitation, chest discomfort or lower extremity swelling Gastrointestinal:  Denies nausea, constipation, diarrhea GU: Denies dysuria or incontinence Skin: Denies abnormal skin rashes Neurological: Per HPI Musculoskeletal: Denies joint pain, back or neck discomfort. No decrease in ROM Behavioral/Psych: +depressed mood   Physical Exam: Encounter date 02/18/18 02/18/18 12/01/17  Last reading 11:25 AM 10:52 AM 12:18 PM  BP -- 130/79 117/75  Pulse Rate -- 88 96  Resp -- 18 17  Temp -- 98.2 F (36.8 C) 97.8 F (36.6 C)  Temp Source -- Oral Oral  SpO2 -- 96 % 95 %  Weight 129 lb 4.8 oz (58.7 kg) 129 lb (58.5 kg) 128 lb  8 oz (58.3 kg)  Height -- 5\' 1"  (1.549 m)     KPS: 80. General: Alert, cooperative, pleasant, in no acute distress Head: Craniotomy scar noted, dry and intact. EENT: No conjunctival injection or scleral icterus. Oral mucosa moist Lungs: Resp effort normal Cardiac: Regular rate and rhythm Abdomen: Soft, non-distended abdomen Skin: No rashes cyanosis or petechiae. Extremities: No clubbing or edema  Neurologic Exam: Mental Status: Awake, alert, attentive to examiner. Oriented to self and environment. Language is fluent with intact comprehension.  Cranial Nerves: Visual acuity is grossly normal. Visual fields are full. Extra-ocular movements intact. No ptosis. Face  is symmetric, tongue midline. Motor: Tone and bulk are normal. Power is full in both arms and legs. Reflexes are absent or trace throughout, no pathologic reflexes present. Intact finger to nose bilaterally Sensory: Impaired to pain/temp in distal fingers and lower legs to mid shins bilaterally Gait: Mild sensory dystaxia  Labs: I have reviewed the data as listed    Component Value Date/Time   NA 138 02/15/2018 1344   NA 138 07/08/2017 0831   K 4.2 02/15/2018 1344   K 3.3 (L) 07/08/2017 0831   CL 103 02/15/2018 1344   CO2 27 02/15/2018 1344   CO2 24 07/08/2017 0831   GLUCOSE 90 02/15/2018 1344   GLUCOSE 127 07/08/2017 0831   BUN 7 (L) 02/15/2018 1344   BUN 4.5 (L) 07/08/2017 0831   CREATININE 0.71 02/15/2018 1344   CREATININE 0.6 07/08/2017 0831   CALCIUM 9.2 02/15/2018 1344   CALCIUM 9.0 07/08/2017 0831   PROT 8.1 02/15/2018 1344   PROT 7.0 07/08/2017 0831   ALBUMIN 3.5 02/15/2018 1344   ALBUMIN 2.9 (L) 07/08/2017 0831   AST 22 02/15/2018 1344   AST 25 07/08/2017 0831   ALT 13 02/15/2018 1344   ALT 16 07/08/2017 0831   ALKPHOS 103 02/15/2018 1344   ALKPHOS 119 07/08/2017 0831   BILITOT 0.9 02/15/2018 1344   BILITOT 0.52 07/08/2017 0831   GFRNONAA >60 02/15/2018 1344   GFRNONAA 76 01/21/2013 1526   GFRAA  >60 02/15/2018 1344   GFRAA 88 01/21/2013 1526   Lab Results  Component Value Date   WBC 5.9 02/15/2018   NEUTROABS 3.3 02/15/2018   HGB 12.5 02/15/2018   HCT 38.7 02/15/2018   MCV 106.0 (H) 02/15/2018   PLT 168 02/15/2018    Imaging:  CLINICAL DATA:  History of non-small-cell lung cancer, 7 month follow-up SRS.  Left cerebellar 12 mm target was treated to a prescription dose of 20 Gy. Left insular 7 mm target was treated to a prescription dose of 20 Gy.  EXAM: MRI HEAD WITHOUT AND WITH CONTRAST  TECHNIQUE: Multiplanar, multiecho pulse sequences of the brain and surrounding structures were obtained without and with intravenous contrast.  CONTRAST:  49mL MULTIHANCE GADOBENATE DIMEGLUMINE 529 MG/ML IV SOLN  Creatinine was obtained on site at [facility] at [address]  Results: Creatinine 0.5 mg/dL.  COMPARISON:  07/10/2017.  FINDINGS: Brain: Previous small area of RIGHT centrum semiovale restricted diffusion has resolved, and likely represented an acute/subacute incidental infarct.  Treated lesion LEFT cerebellum continues to regress, now 3 mm cross-section versus 4 on the most recent prior.  Treated lesion LEFT insula is now increased in size, 5 mm diameter, with mild restriction. This could represent recurrent tumor or post treatment effect.  Three new lesions are seen. There is a 4 mm lesion in the RIGHT inferior temporal lobe, subcortical white matter, series 10, image 50. A 2 mm lesion is seen in the RIGHT occipital lobe, periventricular white matter, series 10, image 61. A third lesion involves the LEFT temporal lobe, superior temporal gyrus, 4 mm, series 10, image 68. None have significant mass effect or surrounding edema.  Generalized atrophy with hydrocephalus ex vacuo. Extensive periventricular white matter signal abnormality, representing small vessel disease.  Vascular: Normal flow voids.  Skull and upper  cervical spine: Normal marrow signal.  Sinuses/Orbits: Negative sinuses.  BILATERAL cataract extraction.  Other: Unchanged parotid lesions.  IMPRESSION: Three new subcentimeter metastases are identified in the brain. See discussion above.  Interval growth of the  treated lesion in the LEFT insula could represent recurrent tumor or post treatment effect.  Continued involution of the treated LEFT cerebellar metastasis.  Resolved RIGHT centrum semiovale infarct.   Electronically Signed   By: Staci Righter M.D.   On: 10/08/2017 13:25    Assessment/Plan 1. 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   We recommended adding Cymbalta 30mg  daily in addition to Gabapentin to 800mg  TID.  We hope this can address some of her depressed mood as well.   Can consider switching to Pregabalin with further treatment failure.    She should return to clinic in 3 months following next MRI brain.  We appreciate the opportunity to participate in the care of Jacqueline Kidd.   All questions were answered. The patient knows to call the clinic with any problems, questions or concerns. No barriers to learning were detected.  The total time spent in the encounter was 25 minutes and more than 50% was on counseling and review of test results   Ventura Sellers, MD Medical Director of Neuro-Oncology [facility] at [place] 02/18/18 11:16 AM

## 2018-02-18 NOTE — Progress Notes (Signed)
DISCONTINUE ON PATHWAY REGIMEN - Non-Small Cell Lung     A cycle is every 21 days:     Paclitaxel      Carboplatin   **Always confirm dose/schedule in your pharmacy ordering system**  REASON: Disease Progression PRIOR TREATMENT: [id number]: Carboplatin + Paclitaxel q21 Days x 4 Cycles TREATMENT RESPONSE: Partial Response (PR)  START ON PATHWAY REGIMEN - Non-Small Cell Lung     A cycle is every 28 days:     Nivolumab   **Always confirm dose/schedule in your pharmacy ordering system**  Patient Characteristics: Stage IV Metastatic, Squamous, PS = 0, 1, Second Line, No Prior PD-1/PD-L1  Inhibitor and Immunotherapy Candidate AJCC T Category: T2b Current Disease Status: Distant Metastases AJCC N Category: N3 AJCC M Category: M1c AJCC 8 Stage Grouping: IVB Histology: Squamous Cell Line of therapy: Second Line PD-L1 Expression Status: PD-L1 Negative Performance Status: PS = 0, 1 Immunotherapy Candidate Status: Candidate for Immunotherapy Prior Immunotherapy Status: No Prior PD-1/PD-L1 Inhibitor Intent of Therapy: Non-Curative / Palliative Intent, Discussed with Patient

## 2018-02-25 ENCOUNTER — Inpatient Hospital Stay: Payer: Medicare Other

## 2018-02-25 VITALS — BP 135/74 | HR 84 | Temp 98.3°F | Resp 20

## 2018-02-25 DIAGNOSIS — L989 Disorder of the skin and subcutaneous tissue, unspecified: Secondary | ICD-10-CM | POA: Diagnosis not present

## 2018-02-25 DIAGNOSIS — R05 Cough: Secondary | ICD-10-CM | POA: Diagnosis not present

## 2018-02-25 DIAGNOSIS — C7931 Secondary malignant neoplasm of brain: Secondary | ICD-10-CM | POA: Diagnosis not present

## 2018-02-25 DIAGNOSIS — E279 Disorder of adrenal gland, unspecified: Secondary | ICD-10-CM | POA: Diagnosis not present

## 2018-02-25 DIAGNOSIS — Z87891 Personal history of nicotine dependence: Secondary | ICD-10-CM | POA: Diagnosis not present

## 2018-02-25 DIAGNOSIS — G620 Drug-induced polyneuropathy: Secondary | ICD-10-CM | POA: Diagnosis not present

## 2018-02-25 DIAGNOSIS — C3412 Malignant neoplasm of upper lobe, left bronchus or lung: Secondary | ICD-10-CM | POA: Diagnosis not present

## 2018-02-25 DIAGNOSIS — J90 Pleural effusion, not elsewhere classified: Secondary | ICD-10-CM | POA: Diagnosis not present

## 2018-02-25 DIAGNOSIS — R0609 Other forms of dyspnea: Secondary | ICD-10-CM | POA: Diagnosis not present

## 2018-02-25 DIAGNOSIS — R5383 Other fatigue: Secondary | ICD-10-CM | POA: Diagnosis not present

## 2018-02-25 DIAGNOSIS — R5382 Chronic fatigue, unspecified: Secondary | ICD-10-CM

## 2018-02-25 DIAGNOSIS — Z5112 Encounter for antineoplastic immunotherapy: Secondary | ICD-10-CM | POA: Diagnosis not present

## 2018-02-25 LAB — CBC WITH DIFFERENTIAL (CANCER CENTER ONLY)
BASOS ABS: 0 10*3/uL (ref 0.0–0.1)
BASOS PCT: 0 %
Eosinophils Absolute: 0.1 10*3/uL (ref 0.0–0.5)
Eosinophils Relative: 2 %
HEMATOCRIT: 38.3 % (ref 34.8–46.6)
HEMOGLOBIN: 12.9 g/dL (ref 11.6–15.9)
LYMPHS PCT: 26 %
Lymphs Abs: 1.4 10*3/uL (ref 0.9–3.3)
MCH: 34.1 pg — ABNORMAL HIGH (ref 25.1–34.0)
MCHC: 33.6 g/dL (ref 31.5–36.0)
MCV: 101.4 fL — AB (ref 79.5–101.0)
Monocytes Absolute: 0.6 10*3/uL (ref 0.1–0.9)
Monocytes Relative: 11 %
NEUTROS ABS: 3.2 10*3/uL (ref 1.5–6.5)
NEUTROS PCT: 61 %
Platelet Count: 181 10*3/uL (ref 145–400)
RBC: 3.77 MIL/uL (ref 3.70–5.45)
RDW: 13.6 % (ref 11.2–14.5)
WBC Count: 5.2 10*3/uL (ref 3.9–10.3)

## 2018-02-25 LAB — CMP (CANCER CENTER ONLY)
ALBUMIN: 3.4 g/dL — AB (ref 3.5–5.0)
ALK PHOS: 109 U/L (ref 38–126)
ALT: 14 U/L (ref 0–44)
AST: 22 U/L (ref 15–41)
Anion gap: 8 (ref 5–15)
BILIRUBIN TOTAL: 0.6 mg/dL (ref 0.3–1.2)
BUN: 9 mg/dL (ref 8–23)
CALCIUM: 9.6 mg/dL (ref 8.9–10.3)
CO2: 27 mmol/L (ref 22–32)
Chloride: 101 mmol/L (ref 98–111)
Creatinine: 0.69 mg/dL (ref 0.44–1.00)
GFR, Estimated: >60 mL/min (ref >60)
GLUCOSE: 115 mg/dL — AB (ref 70–99)
POTASSIUM: 4.1 mmol/L (ref 3.5–5.1)
SODIUM: 136 mmol/L (ref 135–145)
TOTAL PROTEIN: 8 g/dL (ref 6.5–8.1)

## 2018-02-25 LAB — TSH: TSH: 1.562 u[IU]/mL (ref 0.308–3.960)

## 2018-02-25 MED ORDER — SODIUM CHLORIDE 0.9 % IV SOLN
Freq: Once | INTRAVENOUS | Status: AC
  Administered 2018-02-25: 10:00:00 via INTRAVENOUS
  Filled 2018-02-25: qty 250

## 2018-02-25 MED ORDER — SODIUM CHLORIDE 0.9 % IV SOLN
480.0000 mg | Freq: Once | INTRAVENOUS | Status: AC
  Administered 2018-02-25: 480 mg via INTRAVENOUS
  Filled 2018-02-25: qty 48

## 2018-02-25 NOTE — Patient Instructions (Signed)
[facility] Discharge Instructions for Patients Receiving Chemotherapy  Today you received the following chemotherapy agents Opdivo  To help prevent nausea and vomiting after your treatment, we encourage you to take your nausea medication as prescribed.   If you develop nausea and vomiting that is not controlled by your nausea medication, call the clinic.   BELOW ARE SYMPTOMS THAT SHOULD BE REPORTED IMMEDIATELY:  *FEVER GREATER THAN 100.5 F  *CHILLS WITH OR WITHOUT FEVER  NAUSEA AND VOMITING THAT IS NOT CONTROLLED WITH YOUR NAUSEA MEDICATION  *UNUSUAL SHORTNESS OF BREATH  *UNUSUAL BRUISING OR BLEEDING  TENDERNESS IN MOUTH AND THROAT WITH OR WITHOUT PRESENCE OF ULCERS  *URINARY PROBLEMS  *BOWEL PROBLEMS  UNUSUAL RASH Items with * indicate a potential emergency and should be followed up as soon as possible.  Feel free to call the clinic should you have any questions or concerns. The clinic phone number is [phone].  Please show the CHEMO ALERT CARD at check-in to the Emergency Department and triage nurse.      Nivolumab injection (Opdivo) What is this medicine? NIVOLUMAB (nye VOL ue mab) is a monoclonal antibody. It is used to treat melanoma, lung cancer, kidney cancer, head and neck cancer, Hodgkin lymphoma, urothelial cancer, colon cancer, and liver cancer. This medicine may be used for other purposes; ask your health care provider or pharmacist if you have questions. COMMON BRAND NAME(S): Opdivo What should I tell my health care provider before I take this medicine? They need to know if you have any of these conditions: -diabetes -immune system problems -kidney disease -liver disease -lung disease -organ transplant -stomach or intestine problems -thyroid disease -an unusual or allergic reaction to nivolumab, other medicines, foods, dyes, or preservatives -pregnant or trying to get pregnant -breast-feeding How should I use this  medicine? This medicine is for infusion into a vein. It is given by a health care professional in a hospital or clinic setting. A special MedGuide will be given to you before each treatment. Be sure to read this information carefully each time. Talk to your pediatrician regarding the use of this medicine in children. While this drug may be prescribed for children as young as 12 years for selected conditions, precautions do apply. Overdosage: If you think you have taken too much of this medicine contact a poison control center or emergency room at once. NOTE: This medicine is only for you. Do not share this medicine with others. What if I miss a dose? It is important not to miss your dose. Call your doctor or health care professional if you are unable to keep an appointment. What may interact with this medicine? Interactions have not been studied. Give your health care provider a list of all the medicines, herbs, non-prescription drugs, or dietary supplements you use. Also tell them if you smoke, drink alcohol, or use illegal drugs. Some items may interact with your medicine. This list may not describe all possible interactions. Give your health care provider a list of all the medicines, herbs, non-prescription drugs, or dietary supplements you use. Also tell them if you smoke, drink alcohol, or use illegal drugs. Some items may interact with your medicine. What should I watch for while using this medicine? This drug may make you feel generally unwell. Continue your course of treatment even though you feel ill unless your doctor tells you to stop. You may need blood work done while you are taking this medicine. Do not become pregnant while taking this medicine or   for 5 months after stopping it. Women should inform their doctor if they wish to become pregnant or think they might be pregnant. There is a potential for serious side effects to an unborn child. Talk to your health care professional or  pharmacist for more information. Do not breast-feed an infant while taking this medicine. What side effects may I notice from receiving this medicine? Side effects that you should report to your doctor or health care professional as soon as possible: -allergic reactions like skin rash, itching or hives, swelling of the face, lips, or tongue -black, tarry stools -blood in the urine -bloody or watery diarrhea -changes in vision -change in sex drive -changes in emotions or moods -chest pain -confusion -cough -decreased appetite -diarrhea -facial flushing -feeling faint or lightheaded -fever, chills -hair loss -hallucination, loss of contact with reality -headache -irritable -joint pain -loss of memory -muscle pain -muscle weakness -seizures -shortness of breath -signs and symptoms of high blood sugar such as dizziness; dry mouth; dry skin; fruity breath; nausea; stomach pain; increased hunger or thirst; increased urination -signs and symptoms of kidney injury like trouble passing urine or change in the amount of urine -signs and symptoms of liver injury like dark yellow or brown urine; general ill feeling or flu-like symptoms; light-colored stools; loss of appetite; nausea; right upper belly pain; unusually weak or tired; yellowing of the eyes or skin -stiff neck -swelling of the ankles, feet, hands -weight gain Side effects that usually do not require medical attention (report to your doctor or health care professional if they continue or are bothersome): -bone pain -constipation -tiredness -vomiting This list may not describe all possible side effects. Call your doctor for medical advice about side effects. You may report side effects to FDA at 1-800-FDA-1088. Where should I keep my medicine? This drug is given in a hospital or clinic and will not be stored at home. NOTE: This sheet is a summary. It may not cover all possible information. If you have questions about this  medicine, talk to your doctor, pharmacist, or health care provider.  2018 Elsevier/Gold Standard (2016-03-31 17:49:34)  

## 2018-02-26 ENCOUNTER — Encounter: Payer: Self-pay | Admitting: Radiation Oncology

## 2018-03-09 ENCOUNTER — Other Ambulatory Visit: Payer: Self-pay

## 2018-03-09 ENCOUNTER — Encounter: Payer: Self-pay | Admitting: Urology

## 2018-03-09 ENCOUNTER — Ambulatory Visit
Admission: RE | Admit: 2018-03-09 | Discharge: 2018-03-09 | Disposition: A | Discharge status: Home / Self Care | Payer: Medicare Other | Source: Ambulatory Visit | Attending: Urology | Admitting: Urology

## 2018-03-09 VITALS — BP 117/95 | HR 95 | Temp 98.2°F | Resp 20 | Ht 61.0 in | Wt 126.6 lb

## 2018-03-09 DIAGNOSIS — C3492 Malignant neoplasm of unspecified part of left bronchus or lung: Secondary | ICD-10-CM | POA: Insufficient documentation

## 2018-03-09 DIAGNOSIS — C7931 Secondary malignant neoplasm of brain: Secondary | ICD-10-CM | POA: Diagnosis not present

## 2018-03-09 DIAGNOSIS — Z79899 Other long term (current) drug therapy: Secondary | ICD-10-CM | POA: Diagnosis not present

## 2018-03-09 DIAGNOSIS — Z885 Allergy status to narcotic agent status: Secondary | ICD-10-CM | POA: Diagnosis not present

## 2018-03-09 DIAGNOSIS — C7951 Secondary malignant neoplasm of bone: Secondary | ICD-10-CM | POA: Diagnosis not present

## 2018-03-09 NOTE — Addendum Note (Signed)
Encounter addended by: Malena Edman, RN on: 03/09/2018 2:04 PM  Actions taken: Charge Capture section accepted

## 2018-03-09 NOTE — Progress Notes (Signed)
Radiation Oncology         [phone] ________________________________  Name: Jacqueline Kidd MRN: [id number]  Date: 03/09/2018  DOB: Dec 11, 1940  Post Treatment Note  CC: [name], MD  [name], MD  Diagnosis:   77 y.o. female with stage IV (T2b, N3, M1 C) non-small cell lung cancer, squamous cell carcinoma of the left upper lung with neck and mediastinal nodal disease and brain metastases.   Interval Since Last Radiation:  1 month 1.   02/05/2018: Brain PTV6-8// 20 Gy in 1 fraction   1. Brain PTV6: Anterior Left Cerebellum 104mm // 20 Gy in 1 fraction, Max dose=119.4%  2. Brain PTV7: Left Occipital 74mm// 20 Gy in 1 fraction, max dose=120.9%  3. Brain PTV8: Left Occipital 29mm // 20 Gy in 1 fraction, max dose=120.9%  2.  10/22/2017:  Brain PTV3-5 // 20 Gy in 1 fraction  1. PTV3: Right Temporal 59mm  2. PTV4: Left Temporal 12mm  3. PTV5: Right Occipital 108mm  3.  Chest radiation 03/23/2017 - 05/06/2017- The primary tumor and involved mediastinal adenopathy were treated to 66 Gy in 33 fractions of 2 Gy.  4.  SRS on 04/03/17: Brain PTV1-2 // 20 Gy in 1 fraction.  1.  PTV1: Left cerebellar 12 mm target  2.  PTV2: Left insular 7 mm target    Narrative:  The patient returns today for routine 1 month follow-up since completion of recent SRS to 3 new brain lesions.  She tolerated treatment well and is currently without complaints.  In summary, she initially presented to her PCP, Dr. Evette Doffing, back in June 2018 with complaints of hoarseness of her voice.  On evaluation with CT of the neck, she was found to have a 5.5 x 5.2 x 4.7 cm left upper lobe lung mass with associated left hilar and mediastinal lymphadenopathy likely impacting the left recurrent laryngeal nerve. There was also laterally displaced left true vocal cord and irregularity of the left false cord, likely related to vocal cord paralysis associated with the mediastinal tumor.  She then had CT scan of the chest on 02/23/2017  which showed a large left upper lobe mass extending to the pleural surface of the mediastinum at the level of the AP window, suspicious for mediastinal invasion. There was irregular metastatic left lower paratracheal and AP window adenopathy as well as subcarinal adenopathy.  PET scan was performed on 10/27/2016, confirming stage IIIB(T2b, N3, M0)  lung cancer.  MRI of the brain was performed 03/05/18 revealing two subcentimeter intracranial metastases within the LEFT temporal lobe and LEFT cerebellum.  She had a CT-guided core biopsy of the left upper lobe lung mass on 03/18/17 and the final pathology ([id number]) was consistent with squamous cell carcinoma.  She elected to proceed with palliative radiation treatments to the chest which were completed between 03/23/2017 - 05/06/2017.  Additionally, she completed SRS treatment for the two brain metastases on 04/03/2017.  She tolerated her treatments well. She was started on palliative systemic chemotherapy with carboplatin and paclitaxel every 3 weeks- first dose on 04/15/2017.  Repeat CT scan of the chest, abdomen and pelvis performed 06/19/18 showed reduction in the left upper lobe lung mass as well as the mediastinal lymphadenopathy with no new or progressive disease in the chest, abdomen or pelvis. Follow up MRI brain on 07/10/17 showed an excellent response to treatment with decreased size of both treated lesions and no evidence of new disease.   She completed 6 cycles of palliative chemotherapy on  07/29/17 and went on observation.  A repeat CT Chest on 08/12/17 showed no concerning findings for disease progression.  Unfortunately, a follow-up brain MRI on 10/08/2017 revealed 3 new subcentimeter metastases with a 4 mm lesion in the RIGHT inferior temporal lobe, a 2 mm lesion in the right occipital lobe and a 4 mm lesion in the left temporal lobe without significant mass-effect or surrounding edema.  There was also noted interval growth of the previously treated lesion  in the left insula but felt related to treatment effect.  She elected to proceed with stereotactic radiosurgery to the 3 new lesions in the brain and this was completed on 10/22/17 and tolerated well.  She had remained on observation only but her most recent repeat systemic imaging with CT scan of the chest, abdomen and pelvis on 02/15/2018 showed disease progression with enlargement of the right adrenal nodule in addition to progressive enlargement of small left pleural effusion and a right parietal scalp lesion.  On follow up MRI brain on 01/21/18 she was noted to have three new subcentimeter brain metastases (3 mm anterior left cerebellar lesion, 4 mm left occipital lobe lesion and an adjacent 3 mm left occipital lobe lesion) with only mild edema at most.  There was increased size of a treated left insular metastasis with increased edema but all other previously treated lesions appeared stable to smaller. Additionally noted were new right frontal and right parietal skull metastases.  She elected to proceed with SRS to the 3 new lesions which was completed on 02/05/18 and tolerated well.  She was recently started on targeted systemic therapy with Opdivo on 02/25/18 for management of her systemic disease. She feels that she is tolerating this treatment well.                  REVIEW OF SYSTEMS:  On review of systems, the patient states that she has been doing fairly well overall.  She denies recent fevers, chills, chest pain, increased shortness of breath, cough or hemoptysis.  She denies dysphagia and though she continues with a decreased appetite she is maintaining her weight. She has persistent chemotherapy induced neuropathy with numbness and tingling in her fingers/hands bilaterally as well as burning pains in the lower extremities bilaterally.  She has continued taking neurontin with some improvement in sxs.  She was recently seen with Dr. Mickeal Skinner and given a trial of Cymbalta in addition to the Neurontin for her  chemo induced polyneuropathy but unfortunately, she could not tolerated the medication because "it made me feel awful".  She denies headaches, visual or auditory changes, tinnitus, dizziness, tremors, focal weakness, seizure activity or imbalance.   ALLERGIES:  is allergic to fosamax [alendronate]; hydrocodone-acetaminophen; and lisinopril-hydrochlorothiazide.  Meds: Current Outpatient Medications  Medication Sig Dispense Refill  . gabapentin (NEURONTIN) 400 MG capsule Take 2 capsules (800 mg total) by mouth 3 (three) times daily. 270 capsule 3  . LORazepam (ATIVAN) 0.5 MG tablet Take 1 tablet (0.5 mg total) by mouth as needed for anxiety (take one tablet 30 minutes prior to MRI scans and radiation treatment). 10 tablet 0  . Omega-3 Fatty Acids (FISH OIL) 1000 MG CAPS Take 1,000 mg by mouth every morning.    Marland Kitchen acetaminophen (TYLENOL) 325 MG tablet Take 650 mg by mouth every 6 (six) hours as needed for mild pain or moderate pain.     . Albuterol Sulfate 108 (90 Base) MCG/ACT AEPB Inhale 2 puffs into the lungs every 6 (six) hours as needed. (Patient  not taking: Reported on 01/27/2018) 1 each 0  . cholecalciferol (VITAMIN D) 1000 units tablet Take 1,000 Units by mouth daily.    . DULoxetine (CYMBALTA) 30 MG capsule Take 1 capsule (30 mg total) by mouth daily. (Patient not taking: Reported on 03/09/2018) 30 capsule 3   No current facility-administered medications for this encounter.     Physical Findings:  height is 5\' 1"  (1.549 m) and weight is 126 lb 9.6 oz (57.4 kg). Her oral temperature is 98.2 F (36.8 C). Her blood pressure is 117/95 (abnormal) and her pulse is 95. Her respiration is 20 and oxygen saturation is 97%.  Pain Assessment Pain Score: 8  Pain Frequency: Constant Pain Loc: Hand(feet)/10 In general this is a well appearing caucasian female in no acute distress. She's alert and oriented x4 and appropriate throughout the examination. Cardiopulmonary assessment is negative for acute  distress and she exhibits normal effort. She appears grossly neurologically intact. EOMs are intact bilaterally and PERRLA.  Strength is 5 out of 5 and equal bilaterally in the upper and lower extremities.  Sensation is intact to light touch in the upper and lower extremities though dulled distally.  Lab Findings: Lab Results  Component Value Date   WBC 5.2 02/25/2018   HGB 12.9 02/25/2018   HCT 38.3 02/25/2018   MCV 101.4 (H) 02/25/2018   PLT 181 02/25/2018     Radiographic Findings: Ct Chest W Contrast  Result Date: 02/15/2018 CLINICAL DATA:  Stage IV left lung cancer diagnosed in July 2018. Post chemotherapy and radiation therapy. Persistent chest congestion. EXAM: CT CHEST, ABDOMEN, AND PELVIS WITH CONTRAST TECHNIQUE: Multidetector CT imaging of the chest, abdomen and pelvis was performed following the standard protocol during bolus administration of intravenous contrast. CONTRAST:  18mL OMNIPAQUE IOHEXOL 300 MG/ML  SOLN COMPARISON:  CT 11/16/2017 and 08/12/2017. FINDINGS: CT CHEST FINDINGS Cardiovascular: Diffuse atherosclerosis of the aorta, great vessels and coronary arteries again noted. There is an aberrant right subclavian artery, but no evidence of acute vascular finding. The heart size is normal. There is a stable small pericardial effusion. Mediastinum/Nodes: There are no enlarged mediastinal, hilar or axillary lymph nodes. Small subcarinal calcification is stable. The esophageal wall remains somewhat indistinct without focal abnormality. The thyroid gland and trachea appear normal. Lungs/Pleura: Small left pleural effusion has mildly increased in volume. There is a component which appears partially loculated posteriorly. No significant pleural fluid on the right. The irradiated left upper lobe mass does not appear significantly changed as measured in a similar manner, measuring 3.3 x 1.7 cm on image 41/7. There are surrounding densities and bronchial distortion attributed to prior  radiation. The reported paramediastinal right upper lobe density on the most recent study appears less discrete and smaller (images 26 and 27/7). No new or enlarging nodules. There is scattered subpleural reticulation in both lungs. Musculoskeletal/Chest wall: No chest wall mass or suspicious osseous findings. Osteopenia and mild thoracic compression deformities are stable. CT ABDOMEN AND PELVIS FINDINGS Hepatobiliary: The liver is normal in density without focal abnormality. Small calcified gallstones are again noted. No gallbladder wall thickening or biliary dilatation. Pancreas: Pancreatic atrophy. No evidence of mass lesion, ductal dilatation or surrounding inflammation. Spleen: Scattered calcified granulomas. Normal in size without suspicious lesion. Adrenals/Urinary Tract: Progressive enlargement of right adrenal nodule, new on the most recent study. This measures 2.5 x 1.9 cm on image 50/2. The left adrenal gland appears normal. The kidneys appear normal without evidence of urinary tract calculus, suspicious lesion or hydronephrosis. No bladder  abnormalities are seen. The bladder is partly obscured by artifact from the patient's right hip surgery. Stomach/Bowel: No evidence of bowel wall thickening, distention or surrounding inflammatory change. Vascular/Lymphatic: There are no enlarged abdominal or pelvic lymph nodes. Aortic and branch vessel atherosclerosis. Reproductive: Hysterectomy. Probable ovarian tissue bilaterally, stable. No adnexal mass. Other: No evidence of abdominal wall mass or hernia. No ascites. Musculoskeletal: No acute or significant osseous findings. Multiple lumbar compression deformities are stable. Previous right total hip arthroplasty. IMPRESSION: 1. Progressive enlargement of small left pleural effusion. No other significant changes within the chest. The irradiated left upper lobe lesion has not significantly changed. 2. Progressive enlargement of indeterminate right adrenal nodule,  not present 6 months ago. This remains concerning for metastatic disease. 3. No other evidence of metastatic disease in the chest, abdomen or pelvis. 4. Stable additional findings including subpleural reticulation in both lungs, aberrant right subclavian artery, cholelithiasis, chronic thoracolumbar compression deformities and Aortic Atherosclerosis (ICD10-I70.0). Electronically Signed   By: Richardean Sale M.D.   On: 02/15/2018 16:12   Ct Abdomen Pelvis W Contrast  Result Date: 02/15/2018 CLINICAL DATA:  Stage IV left lung cancer diagnosed in July 2018. Post chemotherapy and radiation therapy. Persistent chest congestion. EXAM: CT CHEST, ABDOMEN, AND PELVIS WITH CONTRAST TECHNIQUE: Multidetector CT imaging of the chest, abdomen and pelvis was performed following the standard protocol during bolus administration of intravenous contrast. CONTRAST:  157mL OMNIPAQUE IOHEXOL 300 MG/ML  SOLN COMPARISON:  CT 11/16/2017 and 08/12/2017. FINDINGS: CT CHEST FINDINGS Cardiovascular: Diffuse atherosclerosis of the aorta, great vessels and coronary arteries again noted. There is an aberrant right subclavian artery, but no evidence of acute vascular finding. The heart size is normal. There is a stable small pericardial effusion. Mediastinum/Nodes: There are no enlarged mediastinal, hilar or axillary lymph nodes. Small subcarinal calcification is stable. The esophageal wall remains somewhat indistinct without focal abnormality. The thyroid gland and trachea appear normal. Lungs/Pleura: Small left pleural effusion has mildly increased in volume. There is a component which appears partially loculated posteriorly. No significant pleural fluid on the right. The irradiated left upper lobe mass does not appear significantly changed as measured in a similar manner, measuring 3.3 x 1.7 cm on image 41/7. There are surrounding densities and bronchial distortion attributed to prior radiation. The reported paramediastinal right upper lobe  density on the most recent study appears less discrete and smaller (images 26 and 27/7). No new or enlarging nodules. There is scattered subpleural reticulation in both lungs. Musculoskeletal/Chest wall: No chest wall mass or suspicious osseous findings. Osteopenia and mild thoracic compression deformities are stable. CT ABDOMEN AND PELVIS FINDINGS Hepatobiliary: The liver is normal in density without focal abnormality. Small calcified gallstones are again noted. No gallbladder wall thickening or biliary dilatation. Pancreas: Pancreatic atrophy. No evidence of mass lesion, ductal dilatation or surrounding inflammation. Spleen: Scattered calcified granulomas. Normal in size without suspicious lesion. Adrenals/Urinary Tract: Progressive enlargement of right adrenal nodule, new on the most recent study. This measures 2.5 x 1.9 cm on image 50/2. The left adrenal gland appears normal. The kidneys appear normal without evidence of urinary tract calculus, suspicious lesion or hydronephrosis. No bladder abnormalities are seen. The bladder is partly obscured by artifact from the patient's right hip surgery. Stomach/Bowel: No evidence of bowel wall thickening, distention or surrounding inflammatory change. Vascular/Lymphatic: There are no enlarged abdominal or pelvic lymph nodes. Aortic and branch vessel atherosclerosis. Reproductive: Hysterectomy. Probable ovarian tissue bilaterally, stable. No adnexal mass. Other: No evidence of abdominal wall mass  or hernia. No ascites. Musculoskeletal: No acute or significant osseous findings. Multiple lumbar compression deformities are stable. Previous right total hip arthroplasty. IMPRESSION: 1. Progressive enlargement of small left pleural effusion. No other significant changes within the chest. The irradiated left upper lobe lesion has not significantly changed. 2. Progressive enlargement of indeterminate right adrenal nodule, not present 6 months ago. This remains concerning for  metastatic disease. 3. No other evidence of metastatic disease in the chest, abdomen or pelvis. 4. Stable additional findings including subpleural reticulation in both lungs, aberrant right subclavian artery, cholelithiasis, chronic thoracolumbar compression deformities and Aortic Atherosclerosis (ICD10-I70.0). Electronically Signed   By: Richardean Sale M.D.   On: 02/15/2018 16:12    Impression/Plan: 1. 77 y.o. female with stage IV (T2b, N3, M1 C) NSCLC, squamous cell carcinoma of the left upper lung with neck and mediastinal nodal disease and brain metastases.   She appears to have recovered well from the effects of her recent [employer] treatment.  She is currently without complaints.  We will plan to repeat an MRI of the brain in approximately 2 months to assess her treatment response and as long as this scan is stable, we will plan to resume surveillance brain MRIs every 3 months to monitor for any disease progression or recurrence.  Her upcoming brain MRI imaging will be presented at multidisciplinary brain conference prior to her follow-up office visit to review results and discuss any further recommendations at that time.  She will also continue in routine follow-up under the care and direction of Dr. Earlie Server for management of her systemic disease with plans to continue with targeted systemic therapy on Opdivo.  She appears to have a good understanding of our recommendations and is comfortable and in agreement with the stated plan.  She knows to call at anytime with any questions or concerns in the interim.     [name], PA-C

## 2018-03-24 ENCOUNTER — Other Ambulatory Visit: Payer: Self-pay | Admitting: Pediatrics

## 2018-03-24 DIAGNOSIS — G629 Polyneuropathy, unspecified: Secondary | ICD-10-CM

## 2018-03-25 ENCOUNTER — Other Ambulatory Visit: Payer: Self-pay | Admitting: *Deleted

## 2018-03-25 ENCOUNTER — Inpatient Hospital Stay: Payer: Medicare Other

## 2018-03-25 ENCOUNTER — Inpatient Hospital Stay: Payer: Medicare Other | Attending: Internal Medicine

## 2018-03-25 ENCOUNTER — Telehealth: Payer: Self-pay | Admitting: Oncology

## 2018-03-25 ENCOUNTER — Inpatient Hospital Stay (HOSPITAL_BASED_OUTPATIENT_CLINIC_OR_DEPARTMENT_OTHER): Payer: Medicare Other | Admitting: Oncology

## 2018-03-25 ENCOUNTER — Encounter: Payer: Self-pay | Admitting: Oncology

## 2018-03-25 VITALS — BP 134/84 | HR 109 | Temp 98.4°F

## 2018-03-25 DIAGNOSIS — Z5112 Encounter for antineoplastic immunotherapy: Secondary | ICD-10-CM | POA: Diagnosis not present

## 2018-03-25 DIAGNOSIS — C3412 Malignant neoplasm of upper lobe, left bronchus or lung: Secondary | ICD-10-CM

## 2018-03-25 DIAGNOSIS — J90 Pleural effusion, not elsewhere classified: Secondary | ICD-10-CM

## 2018-03-25 DIAGNOSIS — G620 Drug-induced polyneuropathy: Secondary | ICD-10-CM | POA: Diagnosis not present

## 2018-03-25 DIAGNOSIS — R599 Enlarged lymph nodes, unspecified: Secondary | ICD-10-CM

## 2018-03-25 DIAGNOSIS — C7931 Secondary malignant neoplasm of brain: Secondary | ICD-10-CM | POA: Diagnosis not present

## 2018-03-25 DIAGNOSIS — E279 Disorder of adrenal gland, unspecified: Secondary | ICD-10-CM

## 2018-03-25 DIAGNOSIS — C7951 Secondary malignant neoplasm of bone: Secondary | ICD-10-CM

## 2018-03-25 DIAGNOSIS — R5382 Chronic fatigue, unspecified: Secondary | ICD-10-CM

## 2018-03-25 DIAGNOSIS — Z79899 Other long term (current) drug therapy: Secondary | ICD-10-CM | POA: Diagnosis not present

## 2018-03-25 LAB — CMP (CANCER CENTER ONLY)
ALT: 10 U/L (ref 0–44)
AST: 22 U/L (ref 15–41)
Albumin: 3.3 g/dL — ABNORMAL LOW (ref 3.5–5.0)
Alkaline Phosphatase: 101 U/L (ref 38–126)
Anion gap: 7 (ref 5–15)
BUN: 7 mg/dL — ABNORMAL LOW (ref 8–23)
CHLORIDE: 105 mmol/L (ref 98–111)
CO2: 28 mmol/L (ref 22–32)
CREATININE: 0.67 mg/dL (ref 0.44–1.00)
Calcium: 9.4 mg/dL (ref 8.9–10.3)
Glucose, Bld: 94 mg/dL (ref 70–99)
POTASSIUM: 3.8 mmol/L (ref 3.5–5.1)
SODIUM: 140 mmol/L (ref 135–145)
Total Bilirubin: 0.8 mg/dL (ref 0.3–1.2)
Total Protein: 7.7 g/dL (ref 6.5–8.1)

## 2018-03-25 LAB — CBC WITH DIFFERENTIAL (CANCER CENTER ONLY)
Basophils Absolute: 0 10*3/uL (ref 0.0–0.1)
Basophils Relative: 0 %
EOS ABS: 0.1 10*3/uL (ref 0.0–0.5)
Eosinophils Relative: 2 %
HEMATOCRIT: 36.9 % (ref 34.8–46.6)
HEMOGLOBIN: 12.4 g/dL (ref 11.6–15.9)
LYMPHS ABS: 1.6 10*3/uL (ref 0.9–3.3)
LYMPHS PCT: 31 %
MCH: 34.6 pg — AB (ref 25.1–34.0)
MCHC: 33.8 g/dL (ref 31.5–36.0)
MCV: 102.5 fL — AB (ref 79.5–101.0)
MONOS PCT: 9 %
Monocytes Absolute: 0.5 10*3/uL (ref 0.1–0.9)
NEUTROS ABS: 2.9 10*3/uL (ref 1.5–6.5)
NEUTROS PCT: 58 %
Platelet Count: 178 10*3/uL (ref 145–400)
RBC: 3.6 MIL/uL — AB (ref 3.70–5.45)
RDW: 13.9 % (ref 11.2–14.5)
WBC: 5 10*3/uL (ref 3.9–10.3)

## 2018-03-25 LAB — TSH: TSH: 0.704 u[IU]/mL (ref 0.308–3.960)

## 2018-03-25 MED ORDER — SODIUM CHLORIDE 0.9 % IV SOLN
480.0000 mg | Freq: Once | INTRAVENOUS | Status: AC
  Administered 2018-03-25: 480 mg via INTRAVENOUS
  Filled 2018-03-25: qty 48

## 2018-03-25 MED ORDER — SODIUM CHLORIDE 0.9 % IV SOLN
Freq: Once | INTRAVENOUS | Status: AC
  Administered 2018-03-25: 16:00:00 via INTRAVENOUS
  Filled 2018-03-25: qty 250

## 2018-03-25 NOTE — Patient Instructions (Signed)
[facility] Discharge Instructions for Patients Receiving Chemotherapy  Today you received the following chemotherapy agents Opdivo  To help prevent nausea and vomiting after your treatment, we encourage you to take your nausea medication as prescribed.   If you develop nausea and vomiting that is not controlled by your nausea medication, call the clinic.   BELOW ARE SYMPTOMS THAT SHOULD BE REPORTED IMMEDIATELY:  *FEVER GREATER THAN 100.5 F  *CHILLS WITH OR WITHOUT FEVER  NAUSEA AND VOMITING THAT IS NOT CONTROLLED WITH YOUR NAUSEA MEDICATION  *UNUSUAL SHORTNESS OF BREATH  *UNUSUAL BRUISING OR BLEEDING  TENDERNESS IN MOUTH AND THROAT WITH OR WITHOUT PRESENCE OF ULCERS  *URINARY PROBLEMS  *BOWEL PROBLEMS  UNUSUAL RASH Items with * indicate a potential emergency and should be followed up as soon as possible.  Feel free to call the clinic should you have any questions or concerns. The clinic phone number is [phone].  Please show the CHEMO ALERT CARD at check-in to the Emergency Department and triage nurse.      Nivolumab injection (Opdivo) What is this medicine? NIVOLUMAB (nye VOL ue mab) is a monoclonal antibody. It is used to treat melanoma, lung cancer, kidney cancer, head and neck cancer, Hodgkin lymphoma, urothelial cancer, colon cancer, and liver cancer. This medicine may be used for other purposes; ask your health care provider or pharmacist if you have questions. COMMON BRAND NAME(S): Opdivo What should I tell my health care provider before I take this medicine? They need to know if you have any of these conditions: -diabetes -immune system problems -kidney disease -liver disease -lung disease -organ transplant -stomach or intestine problems -thyroid disease -an unusual or allergic reaction to nivolumab, other medicines, foods, dyes, or preservatives -pregnant or trying to get pregnant -breast-feeding How should I use this  medicine? This medicine is for infusion into a vein. It is given by a health care professional in a hospital or clinic setting. A special MedGuide will be given to you before each treatment. Be sure to read this information carefully each time. Talk to your pediatrician regarding the use of this medicine in children. While this drug may be prescribed for children as young as 12 years for selected conditions, precautions do apply. Overdosage: If you think you have taken too much of this medicine contact a poison control center or emergency room at once. NOTE: This medicine is only for you. Do not share this medicine with others. What if I miss a dose? It is important not to miss your dose. Call your doctor or health care professional if you are unable to keep an appointment. What may interact with this medicine? Interactions have not been studied. Give your health care provider a list of all the medicines, herbs, non-prescription drugs, or dietary supplements you use. Also tell them if you smoke, drink alcohol, or use illegal drugs. Some items may interact with your medicine. This list may not describe all possible interactions. Give your health care provider a list of all the medicines, herbs, non-prescription drugs, or dietary supplements you use. Also tell them if you smoke, drink alcohol, or use illegal drugs. Some items may interact with your medicine. What should I watch for while using this medicine? This drug may make you feel generally unwell. Continue your course of treatment even though you feel ill unless your doctor tells you to stop. You may need blood work done while you are taking this medicine. Do not become pregnant while taking this medicine or   for 5 months after stopping it. Women should inform their doctor if they wish to become pregnant or think they might be pregnant. There is a potential for serious side effects to an unborn child. Talk to your health care professional or  pharmacist for more information. Do not breast-feed an infant while taking this medicine. What side effects may I notice from receiving this medicine? Side effects that you should report to your doctor or health care professional as soon as possible: -allergic reactions like skin rash, itching or hives, swelling of the face, lips, or tongue -black, tarry stools -blood in the urine -bloody or watery diarrhea -changes in vision -change in sex drive -changes in emotions or moods -chest pain -confusion -cough -decreased appetite -diarrhea -facial flushing -feeling faint or lightheaded -fever, chills -hair loss -hallucination, loss of contact with reality -headache -irritable -joint pain -loss of memory -muscle pain -muscle weakness -seizures -shortness of breath -signs and symptoms of high blood sugar such as dizziness; dry mouth; dry skin; fruity breath; nausea; stomach pain; increased hunger or thirst; increased urination -signs and symptoms of kidney injury like trouble passing urine or change in the amount of urine -signs and symptoms of liver injury like dark yellow or brown urine; general ill feeling or flu-like symptoms; light-colored stools; loss of appetite; nausea; right upper belly pain; unusually weak or tired; yellowing of the eyes or skin -stiff neck -swelling of the ankles, feet, hands -weight gain Side effects that usually do not require medical attention (report to your doctor or health care professional if they continue or are bothersome): -bone pain -constipation -tiredness -vomiting This list may not describe all possible side effects. Call your doctor for medical advice about side effects. You may report side effects to FDA at 1-800-FDA-1088. Where should I keep my medicine? This drug is given in a hospital or clinic and will not be stored at home. NOTE: This sheet is a summary. It may not cover all possible information. If you have questions about this  medicine, talk to your doctor, pharmacist, or health care provider.  2018 Elsevier/Gold Standard (2016-03-31 17:49:34)  

## 2018-03-25 NOTE — Telephone Encounter (Signed)
Scheduled appt per 9/19 los - pt to get an updated schedule next visit.   

## 2018-03-25 NOTE — Assessment & Plan Note (Addendum)
This is a very pleasant 77 year old white female with stage IV (T2b, N3, M1 C) non-small cell lung cancer, squamous cell carcinoma presented with large left upper lobe lung mass in addition to mediastinal and supraclavicular lymphadenopathy as well as metastatic brain lesions diagnosed in September 2018. The patient underwent a course of palliative radiotherapy to the left upper lobe lung mass as well as the metastatic brain lesions. She is currently undergoing palliative systemic chemotherapy with carboplatin for AUC of 5 and paclitaxel 175 mg/M2 every 3 weeks, status post 6 cycles. She has been in observation for several months. Repeat CT scan of the chest, abdomen and pelvis showed evidence for disease progression with enlargement of right adrenal nodule in addition to progressive enlargement of small left pleural effusion in addition to the right parietal scalp lesion.  The patient also was found to have evidence for progressive disease in the brain. She is now on second line treatment with immunotherapy was Nivolumab 480 MG IV every 4 weeks.    She tolerated the first cycle well overall. Recommend for her to proceed with cycle 2 of her treatment today as scheduled.  She will follow-up in 4 weeks for evaluation prior to cycle #3.  For the metastatic brain lesions she is followed by Dr. [name] and Dr. Mickeal Skinner.    For the peripheral neuropathy she will continue on gabapentin and Cymbalta.  She was advised to call immediately if she has any concerning symptoms in the interval.  The patient voices understanding of current disease status and treatment options and is in agreement with the current care plan. All questions were answered. The patient knows to call the clinic with any problems, questions or concerns. We can certainly see the patient much sooner if necessary.

## 2018-03-25 NOTE — Progress Notes (Signed)
[facility] OFFICE PROGRESS NOTE  Jacqueline Kidd, Jacqueline Kidd   DIAGNOSIS: stage IV(T2b, N3, M1c)  lung cancer pending further staging workup and tissue diagnosis. The patient presented with large left upper lobe lung mass in addition to left hilar and mediastinal lymphadenopathy and left true vocal cord paralysis.  PRIOR THERAPY:  1) Palliative radiotherapy to the large left upper lobe lung mass as well as metastatic brain lesions. 2)  systemic chemotherapy with carboplatin for AUC of 5 and paclitaxel 175 MG/M2 every 3 weeks. First dose on 04/15/2017.  Status post 6 cycles.  Paclitaxel has been reduced to 150 mg/M2 starting from cycle #4 secondary to peripheral neuropathy.  Last dose of chemotherapy was given July 29, 2017.  CURRENT THERAPY: Second line treatment with immunotherapy with Nivolumab 480 mg IV every 4 weeks.  First dose of February 25, 2018.  Status post 1 cycle. INTERVAL HISTORY: Jacqueline Kidd 77 y.o. female returns for routine follow-up visit accompanied by her husband.  The patient was seen in the infusion room today.  The patient is feeling fine and has no specific complaints.  She tolerated her first cycle of nivolumab fairly well.  She had 1-2 episodes of diarrhea but this is completely resolved.  She did not take any Imodium.  She denies fevers and chills.  Denies chest pain, shortness of breath, cough, hemoptysis.  Denies nausea, vomiting, constipation.  Denies recent weight loss or night sweats.  The patient is here for evaluation prior to cycle #2 over treatment.  MEDICAL HISTORY: Past Medical History:  Diagnosis Date  . Chronic low back pain   . COPD (chronic obstructive pulmonary disease) ([place])   . GERD (gastroesophageal reflux disease)   . Hyperlipidemia   . Hypertension   . Lesion of left lung   . lung ca dx'd 01/2017  . Osteoporosis     ALLERGIES:  is allergic to fosamax [alendronate]; hydrocodone-acetaminophen;  and lisinopril-hydrochlorothiazide.  MEDICATIONS:  Current Outpatient Medications  Medication Sig Dispense Refill  . acetaminophen (TYLENOL) 325 MG tablet Take 650 mg by mouth every 6 (six) hours as needed for mild pain or moderate pain.     . Albuterol Sulfate 108 (90 Base) MCG/ACT AEPB Inhale 2 puffs into the lungs every 6 (six) hours as needed. (Patient not taking: Reported on 01/27/2018) 1 each 0  . cholecalciferol (VITAMIN D) 1000 units tablet Take 1,000 Units by mouth daily.    . DULoxetine (CYMBALTA) 30 MG capsule Take 1 capsule (30 mg total) by mouth daily. (Patient not taking: Reported on 03/09/2018) 30 capsule 3  . gabapentin (NEURONTIN) 400 MG capsule Take 2 capsules (800 mg total) by mouth 3 (three) times daily. 270 capsule 3  . LORazepam (ATIVAN) 0.5 MG tablet Take 1 tablet (0.5 mg total) by mouth as needed for anxiety (take one tablet 30 minutes prior to MRI scans and radiation treatment). 10 tablet 0  . Omega-3 Fatty Acids (FISH OIL) 1000 MG CAPS Take 1,000 mg by mouth every morning.     No current facility-administered medications for this visit.     SURGICAL HISTORY:  Past Surgical History:  Procedure Laterality Date  . ABDOMINAL HYSTERECTOMY    . TOTAL HIP ARTHROPLASTY     right    REVIEW OF SYSTEMS:   Review of Systems  Constitutional: Negative for appetite change, chills, fatigue, fever and unexpected weight change.  HENT:   Negative for mouth sores, nosebleeds, sore throat and trouble  swallowing.   Eyes: Negative for eye problems and icterus.  Respiratory: Negative for cough, hemoptysis, shortness of breath and wheezing.   Cardiovascular: Negative for chest pain and leg swelling.  Gastrointestinal: Negative for abdominal pain, constipation, diarrhea, nausea and vomiting.  Genitourinary: Negative for bladder incontinence, difficulty urinating, dysuria, frequency and hematuria.   Musculoskeletal: Negative for back pain, gait problem, neck pain and neck stiffness.   Skin: Negative for itching and rash.  Neurological: Negative for dizziness, extremity weakness, gait problem, headaches, light-headedness and seizures.  Hematological: Negative for adenopathy. Does not bruise/bleed easily.  Psychiatric/Behavioral: Negative for confusion, depression and sleep disturbance. The patient is not nervous/anxious.     PHYSICAL EXAMINATION:  Temperature 98.4, pulse 109, blood pressure 134/84, O2 sat 97% on room air.  ECOG PERFORMANCE STATUS: 1 - Symptomatic but completely ambulatory  Physical Exam  Constitutional: Oriented to person, place, and time and well-developed, well-nourished, and in no distress. No distress.  HENT:  Head: Normocephalic and atraumatic.  Mouth/Throat: Oropharynx is clear and moist. No oropharyngeal exudate.  Eyes: Conjunctivae are normal. Right eye exhibits no discharge. Left eye exhibits no discharge. No scleral icterus.  Neck: Normal range of motion. Neck supple.  Cardiovascular: Normal rate, regular rhythm, normal heart sounds and intact distal pulses.   Pulmonary/Chest: Effort normal and breath sounds normal. No respiratory distress. No wheezes. No rales.  Abdominal: Soft. Bowel sounds are normal. Exhibits no distension and no mass. There is no tenderness.  Musculoskeletal: Normal range of motion. Exhibits no edema.  Lymphadenopathy:    No cervical adenopathy.  Neurological: Alert and oriented to person, place, and time. Exhibits normal muscle tone. Gait normal. Coordination normal.  Skin: Skin is warm and dry. No rash noted. Not diaphoretic. No erythema. No pallor.  Psychiatric: Mood, memory and judgment normal.  Vitals reviewed.  LABORATORY DATA: Lab Results  Component Value Date   WBC 5.0 03/25/2018   HGB 12.4 03/25/2018   HCT 36.9 03/25/2018   MCV 102.5 (H) 03/25/2018   PLT 178 03/25/2018      Chemistry      Component Value Date/Time   NA 140 03/25/2018 1308   NA 138 07/08/2017 0831   K 3.8 03/25/2018 1308   K 3.3  (L) 07/08/2017 0831   CL 105 03/25/2018 1308   CO2 28 03/25/2018 1308   CO2 24 07/08/2017 0831   BUN 7 (L) 03/25/2018 1308   BUN 4.5 (L) 07/08/2017 0831   CREATININE 0.67 03/25/2018 1308   CREATININE 0.6 07/08/2017 0831      Component Value Date/Time   CALCIUM 9.4 03/25/2018 1308   CALCIUM 9.0 07/08/2017 0831   ALKPHOS 101 03/25/2018 1308   ALKPHOS 119 07/08/2017 0831   AST 22 03/25/2018 1308   AST 25 07/08/2017 0831   ALT 10 03/25/2018 1308   ALT 16 07/08/2017 0831   BILITOT 0.8 03/25/2018 1308   BILITOT 0.52 07/08/2017 0831       RADIOGRAPHIC STUDIES:  No results found.   ASSESSMENT/PLAN:  Primary cancer of left upper lobe of lung ([place]) This is a very pleasant 77 year old white female with stage IV (T2b, N3, M1 C) non-small cell lung cancer, squamous cell carcinoma presented with large left upper lobe lung mass in addition to mediastinal and supraclavicular lymphadenopathy as well as metastatic brain lesions diagnosed in September 2018. The patient underwent a course of palliative radiotherapy to the left upper lobe lung mass as well as the metastatic brain lesions. She is currently undergoing palliative  systemic chemotherapy with carboplatin for AUC of 5 and paclitaxel 175 mg/M2 every 3 weeks, status post 6 cycles. She has been in observation for several months. Repeat CT scan of the chest, abdomen and pelvis showed evidence for disease progression with enlargement of right adrenal nodule in addition to progressive enlargement of small left pleural effusion in addition to the right parietal scalp lesion.  The patient also was found to have evidence for progressive disease in the brain. She is now on second line treatment with immunotherapy was Nivolumab 480 MG IV every 4 weeks.    She tolerated the first cycle well overall. Recommend for her to proceed with cycle 2 of her treatment today as scheduled.  She will follow-up in 4 weeks for evaluation prior to cycle #3.  For the  metastatic brain lesions she is followed by Dr. [name] and Dr. Mickeal Skinner.    For the peripheral neuropathy she will continue on gabapentin and Cymbalta.  She was advised to call immediately if she has any concerning symptoms in the interval.  The patient voices understanding of current disease status and treatment options and is in agreement with the current care plan. All questions were answered. The patient knows to call the clinic with any problems, questions or concerns. We can certainly see the patient much sooner if necessary.   No orders of the defined types were placed in this encounter.    Mikey Bussing, DNP, AGPCNP-BC, AOCNP 03/25/18

## 2018-04-13 ENCOUNTER — Other Ambulatory Visit: Payer: Self-pay | Admitting: Radiation Therapy

## 2018-04-16 ENCOUNTER — Ambulatory Visit
Admission: RE | Admit: 2018-04-16 | Discharge: 2018-04-16 | Disposition: A | Discharge status: Home / Self Care | Payer: Medicare Other | Source: Ambulatory Visit | Attending: Internal Medicine | Admitting: Internal Medicine

## 2018-04-16 DIAGNOSIS — I619 Nontraumatic intracerebral hemorrhage, unspecified: Secondary | ICD-10-CM | POA: Diagnosis not present

## 2018-04-16 DIAGNOSIS — C7931 Secondary malignant neoplasm of brain: Secondary | ICD-10-CM

## 2018-04-16 DIAGNOSIS — C3490 Malignant neoplasm of unspecified part of unspecified bronchus or lung: Secondary | ICD-10-CM | POA: Diagnosis not present

## 2018-04-16 MED ORDER — GADOBENATE DIMEGLUMINE 529 MG/ML IV SOLN
10.0000 mL | Freq: Once | INTRAVENOUS | Status: AC | PRN
  Administered 2018-04-16: 10 mL via INTRAVENOUS

## 2018-04-19 ENCOUNTER — Other Ambulatory Visit: Payer: Medicare Other

## 2018-04-22 ENCOUNTER — Encounter: Payer: Self-pay | Admitting: Oncology

## 2018-04-22 ENCOUNTER — Inpatient Hospital Stay: Payer: Medicare Other

## 2018-04-22 ENCOUNTER — Telehealth: Payer: Self-pay

## 2018-04-22 ENCOUNTER — Encounter: Payer: Self-pay | Admitting: Internal Medicine

## 2018-04-22 ENCOUNTER — Inpatient Hospital Stay: Payer: Medicare Other | Attending: Internal Medicine

## 2018-04-22 ENCOUNTER — Telehealth: Payer: Self-pay | Admitting: Internal Medicine

## 2018-04-22 ENCOUNTER — Inpatient Hospital Stay (HOSPITAL_BASED_OUTPATIENT_CLINIC_OR_DEPARTMENT_OTHER): Payer: Medicare Other | Admitting: Oncology

## 2018-04-22 ENCOUNTER — Inpatient Hospital Stay: Payer: Medicare Other | Admitting: Internal Medicine

## 2018-04-22 VITALS — BP 123/72 | HR 100 | Temp 97.8°F | Resp 16 | Wt 127.0 lb

## 2018-04-22 VITALS — BP 123/72 | HR 100 | Temp 97.8°F | Resp 16 | Ht 61.0 in | Wt 127.0 lb

## 2018-04-22 DIAGNOSIS — Z5112 Encounter for antineoplastic immunotherapy: Secondary | ICD-10-CM | POA: Diagnosis not present

## 2018-04-22 DIAGNOSIS — C3412 Malignant neoplasm of upper lobe, left bronchus or lung: Secondary | ICD-10-CM | POA: Diagnosis not present

## 2018-04-22 DIAGNOSIS — Z79899 Other long term (current) drug therapy: Secondary | ICD-10-CM | POA: Insufficient documentation

## 2018-04-22 DIAGNOSIS — J90 Pleural effusion, not elsewhere classified: Secondary | ICD-10-CM

## 2018-04-22 DIAGNOSIS — G620 Drug-induced polyneuropathy: Secondary | ICD-10-CM | POA: Insufficient documentation

## 2018-04-22 DIAGNOSIS — T451X5A Adverse effect of antineoplastic and immunosuppressive drugs, initial encounter: Secondary | ICD-10-CM

## 2018-04-22 DIAGNOSIS — E279 Disorder of adrenal gland, unspecified: Secondary | ICD-10-CM

## 2018-04-22 DIAGNOSIS — Z87891 Personal history of nicotine dependence: Secondary | ICD-10-CM | POA: Insufficient documentation

## 2018-04-22 DIAGNOSIS — R599 Enlarged lymph nodes, unspecified: Secondary | ICD-10-CM

## 2018-04-22 DIAGNOSIS — C7931 Secondary malignant neoplasm of brain: Secondary | ICD-10-CM

## 2018-04-22 DIAGNOSIS — R5382 Chronic fatigue, unspecified: Secondary | ICD-10-CM

## 2018-04-22 DIAGNOSIS — R197 Diarrhea, unspecified: Secondary | ICD-10-CM

## 2018-04-22 LAB — CBC WITH DIFFERENTIAL (CANCER CENTER ONLY)
ABS IMMATURE GRANULOCYTES: 0.01 10*3/uL (ref 0.00–0.07)
BASOS ABS: 0 10*3/uL (ref 0.0–0.1)
Basophils Relative: 0 %
Eosinophils Absolute: 0.1 10*3/uL (ref 0.0–0.5)
Eosinophils Relative: 2 %
HCT: 38.1 % (ref 36.0–46.0)
HEMOGLOBIN: 12.4 g/dL (ref 12.0–15.0)
IMMATURE GRANULOCYTES: 0 %
LYMPHS ABS: 2.2 10*3/uL (ref 0.7–4.0)
LYMPHS PCT: 36 %
MCH: 34.1 pg — ABNORMAL HIGH (ref 26.0–34.0)
MCHC: 32.5 g/dL (ref 30.0–36.0)
MCV: 104.7 fL — ABNORMAL HIGH (ref 80.0–100.0)
MONOS PCT: 9 %
Monocytes Absolute: 0.5 10*3/uL (ref 0.1–1.0)
NEUTROS PCT: 53 %
NRBC: 0 % (ref 0.0–0.2)
Neutro Abs: 3.1 10*3/uL (ref 1.7–7.7)
Platelet Count: 181 10*3/uL (ref 150–400)
RBC: 3.64 MIL/uL — ABNORMAL LOW (ref 3.87–5.11)
RDW: 13.2 % (ref 11.5–15.5)
WBC Count: 5.9 10*3/uL (ref 4.0–10.5)

## 2018-04-22 LAB — CMP (CANCER CENTER ONLY)
ALBUMIN: 3.4 g/dL — AB (ref 3.5–5.0)
ALK PHOS: 110 U/L (ref 38–126)
ALT: 9 U/L (ref 0–44)
AST: 18 U/L (ref 15–41)
Anion gap: 8 (ref 5–15)
BILIRUBIN TOTAL: 0.9 mg/dL (ref 0.3–1.2)
BUN: 6 mg/dL — AB (ref 8–23)
CO2: 27 mmol/L (ref 22–32)
CREATININE: 0.65 mg/dL (ref 0.44–1.00)
Calcium: 9.6 mg/dL (ref 8.9–10.3)
Chloride: 104 mmol/L (ref 98–111)
GFR, Est AFR Am: >60 mL/min (ref >60)
GFR, Estimated: >60 mL/min (ref >60)
GLUCOSE: 102 mg/dL — AB (ref 70–99)
POTASSIUM: 4.5 mmol/L (ref 3.5–5.1)
Sodium: 139 mmol/L (ref 135–145)
TOTAL PROTEIN: 8 g/dL (ref 6.5–8.1)

## 2018-04-22 LAB — TSH: TSH: 0.6 u[IU]/mL (ref 0.308–3.960)

## 2018-04-22 MED ORDER — SODIUM CHLORIDE 0.9 % IV SOLN
480.0000 mg | Freq: Once | INTRAVENOUS | Status: AC
  Administered 2018-04-22: 480 mg via INTRAVENOUS
  Filled 2018-04-22: qty 48

## 2018-04-22 MED ORDER — SODIUM CHLORIDE 0.9 % IV SOLN
Freq: Once | INTRAVENOUS | Status: AC
  Administered 2018-04-22: 15:00:00 via INTRAVENOUS
  Filled 2018-04-22: qty 250

## 2018-04-22 MED ORDER — DULOXETINE HCL 60 MG PO CPEP: 60.0000 mg | ORAL_CAPSULE | Freq: Every day | ORAL | 3 refills | Status: DC

## 2018-04-22 NOTE — Patient Instructions (Signed)
[facility] Discharge Instructions for Patients Receiving Chemotherapy  Today you received the following chemotherapy agents :  Opdivo.  To help prevent nausea and vomiting after your treatment, we encourage you to take your nausea medication as prescribed.   If you develop nausea and vomiting that is not controlled by your nausea medication, call the clinic.   BELOW ARE SYMPTOMS THAT SHOULD BE REPORTED IMMEDIATELY:  *FEVER GREATER THAN 100.5 F  *CHILLS WITH OR WITHOUT FEVER  NAUSEA AND VOMITING THAT IS NOT CONTROLLED WITH YOUR NAUSEA MEDICATION  *UNUSUAL SHORTNESS OF BREATH  *UNUSUAL BRUISING OR BLEEDING  TENDERNESS IN MOUTH AND THROAT WITH OR WITHOUT PRESENCE OF ULCERS  *URINARY PROBLEMS  *BOWEL PROBLEMS  UNUSUAL RASH Items with * indicate a potential emergency and should be followed up as soon as possible.  Feel free to call the clinic should you have any questions or concerns. The clinic phone number is [phone].  Please show the CHEMO ALERT CARD at check-in to the Emergency Department and triage nurse.   

## 2018-04-22 NOTE — Telephone Encounter (Signed)
Scheduled appt per 10-17 los - pt to get an updated schedule next visit.

## 2018-04-22 NOTE — Telephone Encounter (Signed)
Printed avs and calender of upcoming appointment. Per 10/17 los

## 2018-04-22 NOTE — Progress Notes (Signed)
[facility] at [place],  [id number] [phone]   Interval Evaluation  Date of Service: 04/22/18 Patient Name: Jacqueline Kidd Patient MRN: [id number] Patient DOB: 05/31/1941 Provider: Ventura Sellers, MD  Identifying Statement:  Jacqueline Kidd is a 77 y.o. female with Brain metastases ([place]) [C79.31], neuropathy  Primary Cancer: [place] Lung Stage IV  CNS Oncology History: 04/03/17: SRS to 2 supratentorial lesions 10/22/17: SRS to 3 (new) supratentorial lesions 02/05/18: SRS to 3 additional sub-centimeter metastases  Interval History:  Jacqueline Kidd presents today after recent brain MRI.  She continues to describe painful neuropathy in her hands and feet.  Adding cymbalta to the gabapentin was not well tolerated, it made her feel "dopey" and "slow".  She still does not feel that gabapentin has helped her even at the 2700mg  dose level.  No new neurologic deficits, seizures or headaches.  Prior: She describes impaired sensation in her fingers, as well as her feet and lower legs.  She doesn't describe frank pain, but there is "pins and needles" component.  Hands feel like "sand or sandpaper".  She has trouble locating where her feet are underneath her.  Symptoms began while undergoing cycles of chemotherapy for lung cancer, notably with platinum and taxol based treatments.  Despite dose reduction and eventual completion of therapy, her symptoms have remained.  She doesn't describe any recent worsening.  Walks on her own but getting around "is harder because of my feet".    Medications: Current Outpatient Medications on File Prior to Visit  Medication Sig Dispense Refill  . acetaminophen (TYLENOL) 325 MG tablet Take 650 mg by mouth every 6 (six) hours as needed for mild pain or moderate pain.     . Albuterol Sulfate 108 (90 Base) MCG/ACT AEPB Inhale 2 puffs into the lungs every 6 (six) hours as needed. (Patient not taking: Reported on  01/27/2018) 1 each 0  . cholecalciferol (VITAMIN D) 1000 units tablet Take 1,000 Units by mouth daily.    . DULoxetine (CYMBALTA) 30 MG capsule Take 1 capsule (30 mg total) by mouth daily. (Patient not taking: Reported on 03/09/2018) 30 capsule 3  . gabapentin (NEURONTIN) 400 MG capsule Take 2 capsules (800 mg total) by mouth 3 (three) times daily. 270 capsule 3  . LORazepam (ATIVAN) 0.5 MG tablet Take 1 tablet (0.5 mg total) by mouth as needed for anxiety (take one tablet 30 minutes prior to MRI scans and radiation treatment). 10 tablet 0  . Omega-3 Fatty Acids (FISH OIL) 1000 MG CAPS Take 1,000 mg by mouth every morning.     No current facility-administered medications on file prior to visit.     Allergies:  Allergies  Allergen Reactions  . Fosamax [Alendronate]   . Hydrocodone-Acetaminophen Nausea And Vomiting    EXTREME VOMITING   . Lisinopril-Hydrochlorothiazide Other (See Comments)    Chest spasms   Past Medical History:  Past Medical History:  Diagnosis Date  . Chronic low back pain   . COPD (chronic obstructive pulmonary disease) ([place])   . GERD (gastroesophageal reflux disease)   . Hyperlipidemia   . Hypertension   . Lesion of left lung   . lung ca dx'd 01/2017  . Osteoporosis    Past Surgical History:  Past Surgical History:  Procedure Laterality Date  . ABDOMINAL HYSTERECTOMY    . TOTAL HIP ARTHROPLASTY     right   Social History:  Social History   Socioeconomic History  .  Marital status: Married    Spouse name: Not on file  . Number of children: Not on file  . Years of education: Not on file  . Highest education level: Not on file  Occupational History  . Not on file  Social Needs  . Financial resource strain: Not on file  . Food insecurity:    Worry: Not on file    Inability: Not on file  . Transportation needs:    Medical: Not on file    Non-medical: Not on file  Tobacco Use  . Smoking status: Former Smoker    Packs/day: 1.00    Years: 57.00     Pack years: 57.00    Last attempt to quit: 02/16/2017    Years since quitting: 1.1  . Smokeless tobacco: Never Used  Substance and Sexual Activity  . Alcohol use: No  . Drug use: No  . Sexual activity: Never  Lifestyle  . Physical activity:    Days per week: Not on file    Minutes per session: Not on file  . Stress: Not on file  Relationships  . Social connections:    Talks on phone: Not on file    Gets together: Not on file    Attends religious service: Not on file    Active member of club or organization: Not on file    Attends meetings of clubs or organizations: Not on file    Relationship status: Not on file  . Intimate partner violence:    Fear of current or ex partner: Not on file    Emotionally abused: Not on file    Physically abused: Not on file    Forced sexual activity: Not on file  Other Topics Concern  . Not on file  Social History Narrative   03-09-08 Unable to ask husband with her today.   Family History:  Family History  Problem Relation Age of Onset  . Cancer Mother        breast  . Cancer Father        esophageal  . Cancer Sister        breast    Review of Systems: Constitutional: Denies fevers, chills or abnormal weight loss Eyes: Denies blurriness of vision Ears, nose, mouth, throat, and face: Denies mucositis or sore throat Respiratory: Denies cough, dyspnea or wheezes Cardiovascular: Denies palpitation, chest discomfort or lower extremity swelling Gastrointestinal:  Denies nausea, constipation, diarrhea GU: Denies dysuria or incontinence Skin: Denies abnormal skin rashes Neurological: Per HPI Musculoskeletal: Denies joint pain, back or neck discomfort. No decrease in ROM Behavioral/Psych: +depressed mood   Physical Exam: Encounter date 02/18/18 02/18/18 12/01/17  Last reading 11:25 AM 10:52 AM 12:18 PM  BP -- 130/79 117/75  Pulse Rate -- 88 96  Resp -- 18 17  Temp -- 98.2 F (36.8 C) 97.8 F (36.6 C)  Temp Source -- Oral Oral  SpO2  -- 96 % 95 %  Weight 129 lb 4.8 oz (58.7 kg) 129 lb (58.5 kg) 128 lb 8 oz (58.3 kg)  Height -- 5\' 1"  (1.549 m)     KPS: 80. General: Alert, cooperative, pleasant, in no acute distress Head: Craniotomy scar noted, dry and intact. EENT: No conjunctival injection or scleral icterus. Oral mucosa moist Lungs: Resp effort normal Cardiac: Regular rate and rhythm Abdomen: Soft, non-distended abdomen Skin: No rashes cyanosis or petechiae. Extremities: No clubbing or edema  Neurologic Exam: Mental Status: Awake, alert, attentive to examiner. Oriented to self and environment. Language is  fluent with intact comprehension.  Cranial Nerves: Visual acuity is grossly normal. Visual fields are full. Extra-ocular movements intact. No ptosis. Face is symmetric, tongue midline. Motor: Tone and bulk are normal. Power is full in both arms and legs. Reflexes are absent or trace throughout, no pathologic reflexes present. Intact finger to nose bilaterally Sensory: Impaired to pain/temp in distal fingers and lower legs to mid shins bilaterally Gait: Mild sensory dystaxia  Labs: I have reviewed the data as listed    Component Value Date/Time   NA 140 03/25/2018 1308   NA 138 07/08/2017 0831   K 3.8 03/25/2018 1308   K 3.3 (L) 07/08/2017 0831   CL 105 03/25/2018 1308   CO2 28 03/25/2018 1308   CO2 24 07/08/2017 0831   GLUCOSE 94 03/25/2018 1308   GLUCOSE 127 07/08/2017 0831   BUN 7 (L) 03/25/2018 1308   BUN 4.5 (L) 07/08/2017 0831   CREATININE 0.67 03/25/2018 1308   CREATININE 0.6 07/08/2017 0831   CALCIUM 9.4 03/25/2018 1308   CALCIUM 9.0 07/08/2017 0831   PROT 7.7 03/25/2018 1308   PROT 7.0 07/08/2017 0831   ALBUMIN 3.3 (L) 03/25/2018 1308   ALBUMIN 2.9 (L) 07/08/2017 0831   AST 22 03/25/2018 1308   AST 25 07/08/2017 0831   ALT 10 03/25/2018 1308   ALT 16 07/08/2017 0831   ALKPHOS 101 03/25/2018 1308   ALKPHOS 119 07/08/2017 0831   BILITOT 0.8 03/25/2018 1308   BILITOT 0.52 07/08/2017 0831    GFRNONAA >60 03/25/2018 1308   GFRNONAA 76 01/21/2013 1526   GFRAA >60 03/25/2018 1308   GFRAA 88 01/21/2013 1526   Lab Results  Component Value Date   WBC 5.0 03/25/2018   NEUTROABS 2.9 03/25/2018   HGB 12.4 03/25/2018   HCT 36.9 03/25/2018   MCV 102.5 (H) 03/25/2018   PLT 178 03/25/2018      Imaging:  [facility] Clinician Interpretation: I have personally reviewed the CNS images as listed.  My interpretation, in the context of the patient's clinical presentation, is likely treatment effect  Mr [name] Wo Contrast  Result Date: 04/16/2018 CLINICAL DATA:  Restaging of metastatic lung cancer. EXAM: MRI HEAD WITHOUT AND WITH CONTRAST TECHNIQUE: Multiplanar, multiecho pulse sequences of the brain and surrounding structures were obtained without and with intravenous contrast. CONTRAST:  44mL MULTIHANCE GADOBENATE DIMEGLUMINE 529 MG/ML IV SOLN COMPARISON:  MRI head 01/21/2018 FINDINGS: BRAIN New Lesions: None. Larger lesions: . 1. Left posterior insular is lesion shows interval hemorrhage. Enhancing component also appears to have progressed. In particular there is a 5 mm solid enhancing nodule posterior to the previous identified enhancing nodule which is new. 2. 4 x 5 mm cystic nodule right temporal lobe is increased 3. 3 mm periventricular cystic nodule right occipital shows mild increase. 4. 7.5 x 8.5 mm enhancing lesion left temporal lobe shows progression. 5. 3 mm right occipital lesion is larger, image 86 axial Stable or Smaller lesions: None. 5 mm left cerebellar lesion stable Two adjacent lesions left occipital lobe are smaller measuring 2 mm and 3 mm Left brachium pontis lesion has nearly completely resolved. Other Brain findings: Generalized atrophy. Left posterior temporal edema slightly improved. Interval hemorrhage in left posterior temporal lobe lesion. Negative for acute infarct. Vascular: Normal arterial flow voids Skull and upper cervical spine: Right parietal skull lesion has  improved. There is bone destruction however the soft tissue mass has resolved completely. Small right frontal bone lesion has improved, axial image 43, series 7 Sinuses/Orbits: Negative  Other: None IMPRESSION: Interval progression of 5 lesions as above. Interval hemorrhage in the left posterior temporal lesion. Stable to improved lesions left cerebellum, left occipital lobe, and left brachium pontis. . Electronically Signed   By: Franchot Gallo M.D.   On: 04/16/2018 14:58       Assessment/Plan 1. 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 Brain metastases [facility])  Ms. Solarz is clinically stable now 3 months following SRS.  She has changes on MRI which are suggestive of inflammation from delayed effect of radiation as well as systemic immunotherapy.  We will continue to closely monitor these changes with serial imaging.  Ms. 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    We discussed discontinuing Gabapentin due to poor efficacy, and re-starting Cymbalta at 30mg  daily for one week, then 60mg  daily thereafter.  Can consider switching to Pregabalin with further treatment failure.    She should return to clinic in 3 months following next MRI brain, or sooner as needed.  We appreciate the opportunity to participate in the care of Jacqueline Kidd.   All questions were answered. The patient knows to call the clinic with any problems, questions or concerns. No barriers to learning were detected.  The total time spent in the encounter was 40 minutes and more than 50% was on counseling and review of test results   Ventura Sellers, MD Medical Director of Neuro-Oncology [facility] [place] 04/22/18 12:28 PM

## 2018-04-22 NOTE — Progress Notes (Signed)
Brain and Spine Tumor Board Documentation  Jacqueline Kidd was presented by Cecil Cobbs, MD at Brain and Spine Tumor Board on 04/22/2018, which included representatives from neuro oncology, radiation oncology, surgical oncology, navigation, pathology, radiology.  Jacqueline Kidd was presented as a current patient with history of the following treatments:  .  Additionally, we reviewed previous medical and familial history, history of present illness, and recent lab results along with all available histopathologic and imaging studies. The tumor board considered available treatment options and made the following recommendations:  Active surveillance MRI in 3 mo  Tumor board is a meeting of clinicians from various specialty areas who evaluate and discuss patients for whom a multidisciplinary approach is being considered. Final determinations in the plan of care are those of the provider(s). The responsibility for follow up of recommendations given during tumor board is that of the provider.   Today's extended care, comprehensive team conference, Jacqueline Kidd was not present for the discussion and was not examined.

## 2018-04-22 NOTE — Progress Notes (Signed)
[facility] OFFICE PROGRESS NOTE  [name], MD [place] [id number]  DIAGNOSIS:stage IV(T2b, N3, M1c) lung cancer pending further staging workup and tissue diagnosis. The patient presented with large left upper lobe lung mass in addition to left hilar and mediastinal lymphadenopathy and left true vocal cord paralysis.  PRIOR THERAPY:  1) Palliative radiotherapy to the large left upper lobe lung mass as well as metastatic brain lesions. 2) systemic chemotherapy with carboplatin for AUC of 5 and paclitaxel 175 MG/M2 every 3 weeks. First dose on 04/15/2017. Status post 6 cycles. Paclitaxel has been reduced to 150 mg/M2 starting from cycle #4 secondary to peripheral neuropathy. Last dose of chemotherapy was given July 29, 2017.  CURRENT THERAPY:Second line treatment with immunotherapy with Nivolumab 480 mg IV every 4 weeks. First dose of February 25, 2018.  Status post 2 cycles.  INTERVAL HISTORY: Jacqueline Kidd 77 y.o. female returns for routine follow-up visit accompanied by her son.  The patient is feeling fine today and has no specific complaints except for intermittent mild diarrhea.  The diarrhea is not enough to take Imodium.  She does not have this on a daily basis.  She denies fevers and chills.  Denies chest pain, shortness of breath, cough, hemoptysis.  Denies nausea, vomiting, constipation, diarrhea.  Denies recent weight loss or night sweats.  Denies headaches and visual changes.  The patient is here for evaluation prior to cycle #3 of her treatment.  MEDICAL HISTORY: Past Medical History:  Diagnosis Date  . Chronic low back pain   . COPD (chronic obstructive pulmonary disease) ([place])   . GERD (gastroesophageal reflux disease)   . Hyperlipidemia   . Hypertension   . Lesion of left lung   . lung ca dx'd 01/2017  . Osteoporosis     ALLERGIES:  is allergic to fosamax [alendronate]; hydrocodone-acetaminophen; and  lisinopril-hydrochlorothiazide.  MEDICATIONS:  Current Outpatient Medications  Medication Sig Dispense Refill  . acetaminophen (TYLENOL) 325 MG tablet Take 650 mg by mouth every 6 (six) hours as needed for mild pain or moderate pain.     . Albuterol Sulfate 108 (90 Base) MCG/ACT AEPB Inhale 2 puffs into the lungs every 6 (six) hours as needed. (Patient not taking: Reported on 04/22/2018) 1 each 0  . cholecalciferol (VITAMIN D) 1000 units tablet Take 1,000 Units by mouth daily.    . DULoxetine (CYMBALTA) 60 MG capsule Take 1 capsule (60 mg total) by mouth daily. 30 capsule 3  . gabapentin (NEURONTIN) 400 MG capsule Take 2 capsules (800 mg total) by mouth 3 (three) times daily. 270 capsule 3  . LORazepam (ATIVAN) 0.5 MG tablet Take 1 tablet (0.5 mg total) by mouth as needed for anxiety (take one tablet 30 minutes prior to MRI scans and radiation treatment). (Patient not taking: Reported on 04/22/2018) 10 tablet 0  . Omega-3 Fatty Acids (FISH OIL) 1000 MG CAPS Take 1,000 mg by mouth every morning.     No current facility-administered medications for this visit.    Facility-Administered Medications Ordered in Other Visits  Medication Dose Route Frequency Provider Last Rate Last Dose  . nivolumab (OPDIVO) 480 mg in sodium chloride 0.9 % 100 mL chemo infusion  480 mg Intravenous Once Curt Bears, MD 296 mL/hr at 04/22/18 1607 480 mg at 04/22/18 1607    SURGICAL HISTORY:  Past Surgical History:  Procedure Laterality Date  . ABDOMINAL HYSTERECTOMY    . TOTAL HIP ARTHROPLASTY     right  REVIEW OF SYSTEMS:   Review of Systems  Constitutional: Negative for appetite change, chills, fatigue, fever and unexpected weight change.  HENT:   Negative for mouth sores, nosebleeds, sore throat and trouble swallowing.   Eyes: Negative for eye problems and icterus.  Respiratory: Negative for cough, hemoptysis, shortness of breath and wheezing.   Cardiovascular: Negative for chest pain and leg  swelling.  Gastrointestinal: Negative for abdominal pain, constipation, nausea and vomiting.  Positive for intermittent diarrhea. Genitourinary: Negative for bladder incontinence, difficulty urinating, dysuria, frequency and hematuria.   Musculoskeletal: Negative for back pain, gait problem, neck pain and neck stiffness.  Skin: Negative for itching and rash.  Neurological: Negative for dizziness, extremity weakness, gait problem, headaches, light-headedness and seizures.  Hematological: Negative for adenopathy. Does not bruise/bleed easily.  Psychiatric/Behavioral: Negative for confusion, depression and sleep disturbance. The patient is not nervous/anxious.     PHYSICAL EXAMINATION:  Blood pressure 123/72, pulse 100, temperature 97.8 F (36.6 C), resp. rate 16, weight 127 lb (57.6 kg), SpO2 96 %.  ECOG PERFORMANCE STATUS: 1 - Symptomatic but completely ambulatory  Physical Exam  Constitutional: Oriented to person, place, and time and well-developed, well-nourished, and in no distress. No distress.  HENT:  Head: Normocephalic and atraumatic.  Mouth/Throat: Oropharynx is clear and moist. No oropharyngeal exudate.  Eyes: Conjunctivae are normal. Right eye exhibits no discharge. Left eye exhibits no discharge. No scleral icterus.  Neck: Normal range of motion. Neck supple.  Cardiovascular: Normal rate, regular rhythm, normal heart sounds and intact distal pulses.   Pulmonary/Chest: Effort normal and breath sounds normal. No respiratory distress. No wheezes. No rales.  Abdominal: Soft. Bowel sounds are normal. Exhibits no distension and no mass. There is no tenderness.  Musculoskeletal: Normal range of motion. Exhibits no edema.  Lymphadenopathy:    No cervical adenopathy.  Neurological: Alert and oriented to person, place, and time. Exhibits normal muscle tone. Gait normal. Coordination normal.  Skin: Skin is warm and dry. No rash noted. Not diaphoretic. No erythema. No pallor.   Psychiatric: Mood, memory and judgment normal.  Vitals reviewed.  LABORATORY DATA: Lab Results  Component Value Date   WBC 5.9 04/22/2018   HGB 12.4 04/22/2018   HCT 38.1 04/22/2018   MCV 104.7 (H) 04/22/2018   PLT 181 04/22/2018      Chemistry      Component Value Date/Time   NA 139 04/22/2018 1310   NA 138 07/08/2017 0831   K 4.5 04/22/2018 1310   K 3.3 (L) 07/08/2017 0831   CL 104 04/22/2018 1310   CO2 27 04/22/2018 1310   CO2 24 07/08/2017 0831   BUN 6 (L) 04/22/2018 1310   BUN 4.5 (L) 07/08/2017 0831   CREATININE 0.65 04/22/2018 1310   CREATININE 0.6 07/08/2017 0831      Component Value Date/Time   CALCIUM 9.6 04/22/2018 1310   CALCIUM 9.0 07/08/2017 0831   ALKPHOS 110 04/22/2018 1310   ALKPHOS 119 07/08/2017 0831   AST 18 04/22/2018 1310   AST 25 07/08/2017 0831   ALT 9 04/22/2018 1310   ALT 16 07/08/2017 0831   BILITOT 0.9 04/22/2018 1310   BILITOT 0.52 07/08/2017 0831       RADIOGRAPHIC STUDIES:  Mr [name] FO Contrast  Result Date: 04/16/2018 CLINICAL DATA:  Restaging of metastatic lung cancer. EXAM: MRI HEAD WITHOUT AND WITH CONTRAST TECHNIQUE: Multiplanar, multiecho pulse sequences of the brain and surrounding structures were obtained without and with intravenous contrast. CONTRAST:  42mL MULTIHANCE GADOBENATE  DIMEGLUMINE 529 MG/ML IV SOLN COMPARISON:  MRI head 01/21/2018 FINDINGS: BRAIN New Lesions: None. Larger lesions: . 1. Left posterior insular is lesion shows interval hemorrhage. Enhancing component also appears to have progressed. In particular there is a 5 mm solid enhancing nodule posterior to the previous identified enhancing nodule which is new. 2. 4 x 5 mm cystic nodule right temporal lobe is increased 3. 3 mm periventricular cystic nodule right occipital shows mild increase. 4. 7.5 x 8.5 mm enhancing lesion left temporal lobe shows progression. 5. 3 mm right occipital lesion is larger, image 86 axial Stable or Smaller lesions: None. 5 mm left  cerebellar lesion stable Two adjacent lesions left occipital lobe are smaller measuring 2 mm and 3 mm Left brachium pontis lesion has nearly completely resolved. Other Brain findings: Generalized atrophy. Left posterior temporal edema slightly improved. Interval hemorrhage in left posterior temporal lobe lesion. Negative for acute infarct. Vascular: Normal arterial flow voids Skull and upper cervical spine: Right parietal skull lesion has improved. There is bone destruction however the soft tissue mass has resolved completely. Small right frontal bone lesion has improved, axial image 43, series 7 Sinuses/Orbits: Negative Other: None IMPRESSION: Interval progression of 5 lesions as above. Interval hemorrhage in the left posterior temporal lesion. Stable to improved lesions left cerebellum, left occipital lobe, and left brachium pontis. . Electronically Signed   By: Franchot Gallo M.D.   On: 04/16/2018 14:58     ASSESSMENT/PLAN:  Primary cancer of left upper lobe of lung [facility]) This is a very pleasant 77year old white female with stage IV (T2b, N3, M1 C) non-small cell lung cancer, squamous cell carcinoma presented with large left upper lobe lung mass in addition to mediastinal and supraclavicular lymphadenopathy as well as metastatic brain lesions diagnosed in September 2018. The patient underwent a course of palliative radiotherapy to the left upper lobe lung mass as well as the metastatic brain lesions. She is currently undergoing palliative systemic chemotherapy with carboplatin for AUC of 5 and paclitaxel 175 mg/M2 every 3 weeks, status post 6 cycles. She has been in observation for several months. Repeat CT scan of the chest, abdomen and pelvis showed evidence for disease progression with enlargement of right adrenal nodule in addition to progressive enlargement of small left pleural effusion in addition to the right parietal scalp lesion. The patient also was found to have evidence for progressive  disease in the brain. She is now on second line treatment with immunotherapy was Nivolumab 480MG  IV every 4 weeks.   She is tolerating her treatment well overall with the exception of mild diarrhea.  Recommend for her to proceed with cycle 3 of her treatment today as scheduled.  She will have a restaging CT scan of the chest, abdomen, pelvis prior to her next visit.  She will follow-up in 4 weeks for evaluation prior to cycle #4 and to review her restaging CT scan results.  For the metastatic brain lesions she is followed by Dr. [name] and Dr. Mickeal Skinner.  For the peripheral neuropathy, she was seen by Dr. Mickeal Skinner earlier today who advised her to discontinue gabapentin and continue on Cymbalta.  She was advised to call immediately if she has any concerning symptoms in the interval.  The patient voices understanding of current disease status and treatment options and is in agreement with the current care plan. All questions were answered. The patient knows to call the clinic with any problems, questions or concerns. We can certainly see the patient much sooner  if necessary.   Orders Placed This Encounter  Procedures  . CT ABDOMEN PELVIS W CONTRAST    Standing Status:   Future    Standing Expiration Date:   04/23/2019    Order Specific Question:   If indicated for the ordered procedure, I authorize the administration of contrast media per Radiology protocol    Answer:   Yes    Order Specific Question:   Preferred imaging location?    Answer:   [facility]    Order Specific Question:   Radiology Contrast Protocol - do NOT remove file path    Answer:   [email or web address]    Order Specific Question:   ** REASON FOR EXAM (FREE TEXT)    Answer:   Lung cancer. Restaging.  . CT CHEST W CONTRAST    Standing Status:   Future    Standing Expiration Date:   04/23/2019    Order Specific Question:   If indicated for the ordered procedure, I authorize the  administration of contrast media per Radiology protocol    Answer:   Yes    Order Specific Question:   Preferred imaging location?    Answer:   [facility]    Order Specific Question:   Radiology Contrast Protocol - do NOT remove file path    Answer:   [email or web address]    Order Specific Question:   ** REASON FOR EXAM (FREE TEXT)    Answer:   Lung cancer. Restaging.     Mikey Bussing, DNP, AGPCNP-BC, AOCNP 04/22/18

## 2018-04-22 NOTE — Assessment & Plan Note (Addendum)
This is a very pleasant 77year old white female with stage IV (T2b, N3, M1 C) non-small cell lung cancer, squamous cell carcinoma presented with large left upper lobe lung mass in addition to mediastinal and supraclavicular lymphadenopathy as well as metastatic brain lesions diagnosed in September 2018. The patient underwent a course of palliative radiotherapy to the left upper lobe lung mass as well as the metastatic brain lesions. She is currently undergoing palliative systemic chemotherapy with carboplatin for AUC of 5 and paclitaxel 175 mg/M2 every 3 weeks, status post 6 cycles. She has been in observation for several months. Repeat CT scan of the chest, abdomen and pelvis showed evidence for disease progression with enlargement of right adrenal nodule in addition to progressive enlargement of small left pleural effusion in addition to the right parietal scalp lesion. The patient also was found to have evidence for progressive disease in the brain. She is now on second line treatment with immunotherapy was Nivolumab 480MG  IV every 4 weeks.   She is tolerating her treatment well overall with the exception of mild diarrhea.  Recommend for her to proceed with cycle 3 of her treatment today as scheduled.  She will have a restaging CT scan of the chest, abdomen, pelvis prior to her next visit.  She will follow-up in 4 weeks for evaluation prior to cycle #4 and to review her restaging CT scan results.  For the metastatic brain lesions she is followed by Dr. [name] and Dr. Mickeal Skinner.  For the peripheral neuropathy, she was seen by Dr. Mickeal Skinner earlier today who advised her to discontinue gabapentin and continue on Cymbalta.  She was advised to call immediately if she has any concerning symptoms in the interval.  The patient voices understanding of current disease status and treatment options and is in agreement with the current care plan. All questions were answered. The patient knows to call the  clinic with any problems, questions or concerns. We can certainly see the patient much sooner if necessary.

## 2018-04-26 ENCOUNTER — Other Ambulatory Visit: Payer: Self-pay | Admitting: *Deleted

## 2018-04-26 DIAGNOSIS — C7931 Secondary malignant neoplasm of brain: Secondary | ICD-10-CM

## 2018-05-04 ENCOUNTER — Telehealth: Payer: Self-pay | Admitting: Internal Medicine

## 2018-05-04 ENCOUNTER — Telehealth: Payer: Self-pay | Admitting: *Deleted

## 2018-05-04 ENCOUNTER — Other Ambulatory Visit: Payer: Self-pay | Admitting: Internal Medicine

## 2018-05-04 MED ORDER — PREGABALIN 75 MG PO CAPS: 75.0000 mg | ORAL_CAPSULE | Freq: Two times a day (BID) | ORAL | 3 refills | Status: DC

## 2018-05-04 NOTE — Telephone Encounter (Signed)
MP PAL 11/14 - per [facility] moved appointments to 11/13 w/MM - per MM 12:30 pm ok for f/u time. Spoke with patient and mailed updated schedule.

## 2018-05-04 NOTE — Telephone Encounter (Signed)
Patient called questioning when she was to have MRI completed and to advise that she had stopped the Gabapentin and started the Cymbalta 60 mg but since starting she had been feeling queasy/lethargic/jittery and wanted to know how long she should give it before that resolved.    Reviewed with Dr Mickeal Skinner and he advised her to drop back down to Cymbalta 30 mg daily and that he would submit Lyrcia to pharmacy for approval to try.    Called patient to advise.

## 2018-05-04 NOTE — Telephone Encounter (Signed)
Patient reports poor efficacy of Cymbalta 60mg  daily dose level.  No relief in symptoms and reports feeling more fatigued and nauseated.  She has chemotherapy associated neuropathy that has been refractory to Gabapentin and Cymbalta.  Will plan to initiate therapy with Lyrica 75mg  BID starting dose.  Ventura Sellers, MD

## 2018-05-10 ENCOUNTER — Telehealth: Payer: Self-pay | Admitting: *Deleted

## 2018-05-10 NOTE — Telephone Encounter (Signed)
Patient called to question if she should continue to take the Cymbalta with the Lyrica.  Patient states Lyrica was approved as pharmacy is saying its ready to be picked up.    Per Dr Mickeal Skinner ok to stop Cymbalta since she was not getting any relief from it with the peripheral neuropathy and start the Lyrica.

## 2018-05-17 ENCOUNTER — Ambulatory Visit (HOSPITAL_COMMUNITY)
Admission: RE | Admit: 2018-05-17 | Discharge: 2018-05-17 | Disposition: A | Discharge status: Home / Self Care | Payer: Medicare Other | Source: Ambulatory Visit | Attending: Oncology | Admitting: Oncology

## 2018-05-17 DIAGNOSIS — J90 Pleural effusion, not elsewhere classified: Secondary | ICD-10-CM | POA: Insufficient documentation

## 2018-05-17 DIAGNOSIS — C3412 Malignant neoplasm of upper lobe, left bronchus or lung: Secondary | ICD-10-CM | POA: Diagnosis not present

## 2018-05-17 DIAGNOSIS — I2510 Atherosclerotic heart disease of native coronary artery without angina pectoris: Secondary | ICD-10-CM | POA: Insufficient documentation

## 2018-05-17 DIAGNOSIS — I700 Atherosclerosis of aorta: Secondary | ICD-10-CM | POA: Insufficient documentation

## 2018-05-17 DIAGNOSIS — C3490 Malignant neoplasm of unspecified part of unspecified bronchus or lung: Secondary | ICD-10-CM | POA: Diagnosis not present

## 2018-05-17 MED ORDER — SODIUM CHLORIDE (PF) 0.9 % IJ SOLN
INTRAMUSCULAR | Status: AC
  Filled 2018-05-17: qty 50

## 2018-05-17 MED ORDER — IOHEXOL 300 MG/ML  SOLN
75.0000 mL | Freq: Once | INTRAMUSCULAR | Status: AC | PRN
  Administered 2018-05-17: 75 mL via INTRAVENOUS

## 2018-05-19 ENCOUNTER — Inpatient Hospital Stay: Payer: Medicare Other | Attending: Internal Medicine

## 2018-05-19 ENCOUNTER — Inpatient Hospital Stay (HOSPITAL_BASED_OUTPATIENT_CLINIC_OR_DEPARTMENT_OTHER): Payer: Medicare Other | Admitting: Internal Medicine

## 2018-05-19 ENCOUNTER — Other Ambulatory Visit: Payer: Medicare Other

## 2018-05-19 ENCOUNTER — Encounter: Payer: Self-pay | Admitting: Internal Medicine

## 2018-05-19 ENCOUNTER — Inpatient Hospital Stay: Payer: Medicare Other

## 2018-05-19 VITALS — BP 125/77 | HR 98 | Temp 97.7°F | Resp 20 | Ht 61.0 in | Wt 124.9 lb

## 2018-05-19 DIAGNOSIS — J90 Pleural effusion, not elsewhere classified: Secondary | ICD-10-CM | POA: Diagnosis not present

## 2018-05-19 DIAGNOSIS — R5383 Other fatigue: Secondary | ICD-10-CM | POA: Diagnosis not present

## 2018-05-19 DIAGNOSIS — C7931 Secondary malignant neoplasm of brain: Secondary | ICD-10-CM

## 2018-05-19 DIAGNOSIS — Z79899 Other long term (current) drug therapy: Secondary | ICD-10-CM | POA: Insufficient documentation

## 2018-05-19 DIAGNOSIS — R5382 Chronic fatigue, unspecified: Secondary | ICD-10-CM

## 2018-05-19 DIAGNOSIS — E279 Disorder of adrenal gland, unspecified: Secondary | ICD-10-CM | POA: Insufficient documentation

## 2018-05-19 DIAGNOSIS — Z23 Encounter for immunization: Secondary | ICD-10-CM

## 2018-05-19 DIAGNOSIS — C3412 Malignant neoplasm of upper lobe, left bronchus or lung: Secondary | ICD-10-CM

## 2018-05-19 DIAGNOSIS — Z5112 Encounter for antineoplastic immunotherapy: Secondary | ICD-10-CM

## 2018-05-19 LAB — CBC WITH DIFFERENTIAL (CANCER CENTER ONLY)
ABS IMMATURE GRANULOCYTES: 0.01 10*3/uL (ref 0.00–0.07)
BASOS PCT: 0 %
Basophils Absolute: 0 10*3/uL (ref 0.0–0.1)
Eosinophils Absolute: 0.1 10*3/uL (ref 0.0–0.5)
Eosinophils Relative: 3 %
HCT: 36.1 % (ref 36.0–46.0)
HEMOGLOBIN: 11.6 g/dL — AB (ref 12.0–15.0)
IMMATURE GRANULOCYTES: 0 %
LYMPHS PCT: 33 %
Lymphs Abs: 1.7 10*3/uL (ref 0.7–4.0)
MCH: 33.5 pg (ref 26.0–34.0)
MCHC: 32.1 g/dL (ref 30.0–36.0)
MCV: 104.3 fL — AB (ref 80.0–100.0)
MONO ABS: 0.5 10*3/uL (ref 0.1–1.0)
MONOS PCT: 10 %
NEUTROS ABS: 2.8 10*3/uL (ref 1.7–7.7)
NEUTROS PCT: 54 %
PLATELETS: 173 10*3/uL (ref 150–400)
RBC: 3.46 MIL/uL — AB (ref 3.87–5.11)
RDW: 13.3 % (ref 11.5–15.5)
WBC Count: 5.2 10*3/uL (ref 4.0–10.5)
nRBC: 0 % (ref 0.0–0.2)

## 2018-05-19 LAB — CMP (CANCER CENTER ONLY)
ALBUMIN: 3.1 g/dL — AB (ref 3.5–5.0)
ALT: 10 U/L (ref 0–44)
ANION GAP: 8 (ref 5–15)
AST: 20 U/L (ref 15–41)
Alkaline Phosphatase: 99 U/L (ref 38–126)
BUN: 6 mg/dL — AB (ref 8–23)
CHLORIDE: 106 mmol/L (ref 98–111)
CO2: 25 mmol/L (ref 22–32)
Calcium: 8.9 mg/dL (ref 8.9–10.3)
Creatinine: 0.65 mg/dL (ref 0.44–1.00)
GFR, Est AFR Am: >60 mL/min (ref >60)
GFR, Estimated: >60 mL/min (ref >60)
GLUCOSE: 92 mg/dL (ref 70–99)
POTASSIUM: 4 mmol/L (ref 3.5–5.1)
SODIUM: 139 mmol/L (ref 135–145)
Total Bilirubin: 0.6 mg/dL (ref 0.3–1.2)
Total Protein: 7.2 g/dL (ref 6.5–8.1)

## 2018-05-19 LAB — TSH: TSH: 0.629 u[IU]/mL (ref 0.308–3.960)

## 2018-05-19 MED ORDER — INFLUENZA VAC SPLIT QUAD 0.5 ML IM SUSY
PREFILLED_SYRINGE | INTRAMUSCULAR | Status: AC
  Filled 2018-05-19: qty 0.5

## 2018-05-19 MED ORDER — INFLUENZA VAC SPLIT QUAD 0.5 ML IM SUSY
0.5000 mL | PREFILLED_SYRINGE | Freq: Once | INTRAMUSCULAR | Status: AC
  Administered 2018-05-19: 0.5 mL via INTRAMUSCULAR

## 2018-05-19 MED ORDER — SODIUM CHLORIDE 0.9 % IV SOLN
Freq: Once | INTRAVENOUS | Status: AC
  Administered 2018-05-19: 14:00:00 via INTRAVENOUS
  Filled 2018-05-19: qty 250

## 2018-05-19 MED ORDER — SODIUM CHLORIDE 0.9 % IV SOLN
480.0000 mg | Freq: Once | INTRAVENOUS | Status: AC
  Administered 2018-05-19: 480 mg via INTRAVENOUS
  Filled 2018-05-19: qty 48

## 2018-05-19 NOTE — Progress Notes (Signed)
[place] [phone]   Fax:(336) [place] [address]  DIAGNOSIS: stage IV(T2b, N3, M1c)  lung cancer pending further staging workup and tissue diagnosis. The patient presented with large left upper lobe lung mass in addition to left hilar and mediastinal lymphadenopathy and left true vocal cord paralysis.  PRIOR THERAPY:  1) Palliative radiotherapy to the large left upper lobe lung mass as well as metastatic brain lesions. 2)  systemic chemotherapy with carboplatin for AUC of 5 and paclitaxel 175 MG/M2 every 3 weeks. First dose on 04/15/2017.  Status post 6 cycles.  Paclitaxel has been reduced to 150 mg/M2 starting from cycle #4 secondary to peripheral neuropathy.  Last dose of chemotherapy was given July 29, 2017.  CURRENT THERAPY: Second line treatment with immunotherapy with Nivolumab 480 mg IV every 4 weeks.  First dose of February 25, 2018.  Status post 3 cycles.  INTERVAL HISTORY: Jacqueline Kidd 77 y.o. female returns to the clinic today for follow-up visit accompanied by her husband.  The patient is feeling fine today with no specific complaints except for fatigue.  She also has some tickling in her throat and coughing of thick mucus at times.  She denied having any chest pain, shortness of breath, or hemoptysis.  She has no nausea, vomiting, diarrhea or constipation.  She denied having any fever or chills.  She has no recent weight loss or night sweats.  She has been tolerating her treatment with Nivolumab fairly well.  The patient had repeat CT scan of the chest, abdomen and pelvis performed recently and she is here for evaluation and discussion of her risk her results.  MEDICAL HISTORY: Past Medical History:  Diagnosis Date  . Chronic low back pain   . COPD (chronic obstructive pulmonary disease) ([place])   . GERD (gastroesophageal reflux disease)   . Hyperlipidemia   .  Hypertension   . Lesion of left lung   . lung ca dx'd 01/2017  . Osteoporosis     ALLERGIES:  is allergic to fosamax [alendronate]; hydrocodone-acetaminophen; and lisinopril-hydrochlorothiazide.  MEDICATIONS:  Current Outpatient Medications  Medication Sig Dispense Refill  . acetaminophen (TYLENOL) 325 MG tablet Take 650 mg by mouth every 6 (six) hours as needed for mild pain or moderate pain.     . pregabalin (LYRICA) 75 MG capsule Take 1 capsule (75 mg total) by mouth 2 (two) times daily. 60 capsule 3  . Albuterol Sulfate 108 (90 Base) MCG/ACT AEPB Inhale 2 puffs into the lungs every 6 (six) hours as needed. (Patient not taking: Reported on 05/19/2018) 1 each 0  . DULoxetine (CYMBALTA) 60 MG capsule Take 1 capsule (60 mg total) by mouth daily. (Patient not taking: Reported on 05/19/2018) 30 capsule 3  . gabapentin (NEURONTIN) 400 MG capsule Take 2 capsules (800 mg total) by mouth 3 (three) times daily. (Patient not taking: Reported on 05/19/2018) 270 capsule 3  . LORazepam (ATIVAN) 0.5 MG tablet Take 1 tablet (0.5 mg total) by mouth as needed for anxiety (take one tablet 30 minutes prior to MRI scans and radiation treatment). (Patient not taking: Reported on 04/22/2018) 10 tablet 0   No current facility-administered medications for this visit.     SURGICAL HISTORY:  Past Surgical History:  Procedure Laterality Date  . ABDOMINAL HYSTERECTOMY    . TOTAL HIP ARTHROPLASTY     right    REVIEW OF SYSTEMS:  Constitutional: positive for fatigue Eyes: negative Ears, nose, mouth, throat, and face: negative Respiratory: positive for cough Cardiovascular: negative Gastrointestinal: negative Genitourinary:negative Integument/breast: negative Hematologic/lymphatic: negative Musculoskeletal:negative Neurological: negative Behavioral/Psych: negative Endocrine: negative Allergic/Immunologic: negative   PHYSICAL EXAMINATION: General appearance: alert, cooperative, fatigued and no  distress Head: Normocephalic, without obvious abnormality, atraumatic Neck: no adenopathy, no JVD, supple, symmetrical, trachea midline and thyroid not enlarged, symmetric, no tenderness/mass/nodules Lymph nodes: Cervical, supraclavicular, and axillary nodes normal. Resp: clear to auscultation bilaterally Back: symmetric, no curvature. ROM normal. No CVA tenderness. Cardio: regular rate and rhythm, S1, S2 normal, no murmur, click, rub or gallop GI: soft, non-tender; bowel sounds normal; no masses,  no organomegaly Extremities: extremities normal, atraumatic, no cyanosis or edema Neurologic: Alert and oriented X 3, normal strength and tone. Normal symmetric reflexes. Normal coordination and gait  ECOG PERFORMANCE STATUS: 1 - Symptomatic but completely ambulatory  Blood pressure 125/77, pulse 98, temperature 97.7 F (36.5 C), temperature source Oral, resp. rate 20, height 5\' 1"  (1.549 m), weight 124 lb 14.4 oz (56.7 kg), SpO2 93 %.  LABORATORY DATA: Lab Results  Component Value Date   WBC 5.2 05/19/2018   HGB 11.6 (L) 05/19/2018   HCT 36.1 05/19/2018   MCV 104.3 (H) 05/19/2018   PLT 173 05/19/2018      Chemistry      Component Value Date/Time   NA 139 05/19/2018 1154   NA 138 07/08/2017 0831   K 4.0 05/19/2018 1154   K 3.3 (L) 07/08/2017 0831   CL 106 05/19/2018 1154   CO2 25 05/19/2018 1154   CO2 24 07/08/2017 0831   BUN 6 (L) 05/19/2018 1154   BUN 4.5 (L) 07/08/2017 0831   CREATININE 0.65 05/19/2018 1154   CREATININE 0.6 07/08/2017 0831      Component Value Date/Time   CALCIUM 8.9 05/19/2018 1154   CALCIUM 9.0 07/08/2017 0831   ALKPHOS 99 05/19/2018 1154   ALKPHOS 119 07/08/2017 0831   AST 20 05/19/2018 1154   AST 25 07/08/2017 0831   ALT 10 05/19/2018 1154   ALT 16 07/08/2017 0831   BILITOT 0.6 05/19/2018 1154   BILITOT 0.52 07/08/2017 0831       RADIOGRAPHIC STUDIES: Ct Chest W Contrast  Result Date: 05/17/2018 CLINICAL DATA:  Restaging lung cancer. EXAM:  CT CHEST, ABDOMEN, AND PELVIS WITH CONTRAST TECHNIQUE: Multidetector CT imaging of the chest, abdomen and pelvis was performed following the standard protocol during bolus administration of intravenous contrast. CONTRAST:  15mL OMNIPAQUE IOHEXOL 300 MG/ML  SOLN COMPARISON:  08/12/2017 and 11/16/2017 FINDINGS: CT CHEST FINDINGS Cardiovascular: The heart is normal in size. No pericardial effusion. Stable tortuosity, ectasia and calcification of the thoracic aorta. No dissection. Again noted is an aberrant right subclavian artery. Mediastinum/Nodes: No mediastinal or hilar mass or lymphadenopathy. Stable calcified lymph nodes. Lungs/Pleura: New soft tissue density in the left upper lobe along the mediastinum worrisome for recurrent tumor. PET-CT suggested for further evaluation. Enlarging left pleural effusion which could be malignant. No obvious enhancing pleural nodules. Stable severe underlying lung disease with emphysema and probable interstitial disease. No new pulmonary nodules to suggest pulmonary metastatic disease. Musculoskeletal: No breast masses, supraclavicular or axillary lymphadenopathy. The bony thorax is intact. No lytic or sclerotic bone lesions are identified. Stable severe osteoporosis. CT ABDOMEN PELVIS FINDINGS Hepatobiliary: No focal hepatic lesions to suggest metastatic disease. Small layering gallstones noted the gallbladder. No common bile duct dilatation. Pancreas: No mass, inflammation or ductal dilatation. Advanced pancreatic atrophy. Spleen: Calcified granulomas are  noted. Normal size. No worrisome lesions. Adrenals/Urinary Tract: Significant interval decrease in size of the right adrenal gland lesion. Measures approximately 12 x 10 mm and previously measured 24.5 x 19 mm. No definite left adrenal gland lesion. Both kidneys are unremarkable and stable. The bladder appears normal. Stomach/Bowel: The stomach, duodenum, small bowel and colon are unremarkable. No acute inflammatory changes,  mass lesions or obstructive findings. The terminal ileum is normal. Vascular/Lymphatic: Stable advanced atherosclerotic calcifications involving the aorta and iliac arteries but no focal aneurysm or dissection. The major venous structures are patent. No mesenteric or retroperitoneal mass or adenopathy. Small scattered lymph nodes are stable. Reproductive: The uterus is surgically absent. Both ovaries are still present and appear normal. Other: No pelvic mass or adenopathy. No free pelvic fluid collections. No inguinal mass or adenopathy. No abdominal wall hernia or subcutaneous lesions. Musculoskeletal: No significant bony findings. Advanced osteoporosis and multiple lumbar compression deformities. IMPRESSION: 1. New paramediastinal soft tissue density in the left upper lobe worrisome for recurrent tumor. PET-CT may be helpful for further evaluation. 2. No new pulmonary lesions. 3. Enlarging left pleural effusion. 4. No mediastinal or hilar adenopathy. 5. Stable severe lung disease. 6. Stable severe/advanced atherosclerotic calcifications involving the thoracic aorta and coronary arteries. 7. Interval decrease in size of the right adrenal gland lesion. 8. No new or acute findings in the abdomen/pelvis. Electronically Signed   By: Marijo Sanes M.D.   On: 05/17/2018 16:17   Ct Abdomen Pelvis W Contrast  Result Date: 05/17/2018 CLINICAL DATA:  Restaging lung cancer. EXAM: CT CHEST, ABDOMEN, AND PELVIS WITH CONTRAST TECHNIQUE: Multidetector CT imaging of the chest, abdomen and pelvis was performed following the standard protocol during bolus administration of intravenous contrast. CONTRAST:  71mL OMNIPAQUE IOHEXOL 300 MG/ML  SOLN COMPARISON:  08/12/2017 and 11/16/2017 FINDINGS: CT CHEST FINDINGS Cardiovascular: The heart is normal in size. No pericardial effusion. Stable tortuosity, ectasia and calcification of the thoracic aorta. No dissection. Again noted is an aberrant right subclavian artery.  Mediastinum/Nodes: No mediastinal or hilar mass or lymphadenopathy. Stable calcified lymph nodes. Lungs/Pleura: New soft tissue density in the left upper lobe along the mediastinum worrisome for recurrent tumor. PET-CT suggested for further evaluation. Enlarging left pleural effusion which could be malignant. No obvious enhancing pleural nodules. Stable severe underlying lung disease with emphysema and probable interstitial disease. No new pulmonary nodules to suggest pulmonary metastatic disease. Musculoskeletal: No breast masses, supraclavicular or axillary lymphadenopathy. The bony thorax is intact. No lytic or sclerotic bone lesions are identified. Stable severe osteoporosis. CT ABDOMEN PELVIS FINDINGS Hepatobiliary: No focal hepatic lesions to suggest metastatic disease. Small layering gallstones noted the gallbladder. No common bile duct dilatation. Pancreas: No mass, inflammation or ductal dilatation. Advanced pancreatic atrophy. Spleen: Calcified granulomas are noted. Normal size. No worrisome lesions. Adrenals/Urinary Tract: Significant interval decrease in size of the right adrenal gland lesion. Measures approximately 12 x 10 mm and previously measured 24.5 x 19 mm. No definite left adrenal gland lesion. Both kidneys are unremarkable and stable. The bladder appears normal. Stomach/Bowel: The stomach, duodenum, small bowel and colon are unremarkable. No acute inflammatory changes, mass lesions or obstructive findings. The terminal ileum is normal. Vascular/Lymphatic: Stable advanced atherosclerotic calcifications involving the aorta and iliac arteries but no focal aneurysm or dissection. The major venous structures are patent. No mesenteric or retroperitoneal mass or adenopathy. Small scattered lymph nodes are stable. Reproductive: The uterus is surgically absent. Both ovaries are still present and appear normal. Other: No pelvic mass or adenopathy.  No free pelvic fluid collections. No inguinal mass or  adenopathy. No abdominal wall hernia or subcutaneous lesions. Musculoskeletal: No significant bony findings. Advanced osteoporosis and multiple lumbar compression deformities. IMPRESSION: 1. New paramediastinal soft tissue density in the left upper lobe worrisome for recurrent tumor. PET-CT may be helpful for further evaluation. 2. No new pulmonary lesions. 3. Enlarging left pleural effusion. 4. No mediastinal or hilar adenopathy. 5. Stable severe lung disease. 6. Stable severe/advanced atherosclerotic calcifications involving the thoracic aorta and coronary arteries. 7. Interval decrease in size of the right adrenal gland lesion. 8. No new or acute findings in the abdomen/pelvis. Electronically Signed   By: Marijo Sanes M.D.   On: 05/17/2018 16:17    ASSESSMENT AND PLAN: This is a very pleasant 78 years old white female with stage IV (T2b, N3, M1 C) non-small cell lung cancer, squamous cell carcinoma presented with large left upper lobe lung mass in addition to mediastinal and supraclavicular lymphadenopathy as well as metastatic brain lesions diagnosed in September 2018. The patient underwent a course of palliative radiotherapy to the left upper lobe lung mass as well as the metastatic brain lesions. She is currently undergoing palliative systemic chemotherapy with carboplatin for AUC of 5 and paclitaxel 175 mg/M2 every 3 weeks, status post 6 cycles. She has been in observation for several months. Repeat CT scan of the chest, abdomen and pelvis showed evidence for disease progression with enlargement of right adrenal nodule in addition to progressive enlargement of small left pleural effusion in addition to the right parietal scalp lesion.  The patient also was found to have evidence for progressive disease in the brain. She was started on second line treatment with immunotherapy was Nivolumab 480 mg IV every 4 weeks status post 3 cycles.  She has been tolerating this treatment well with no concerning  adverse effects. The patient had a repeat CT scan of the chest, abdomen and pelvis performed recently.  Her scan showed a stable disease except for new paramediastinal soft tissue density in the left upper lobe concerning for disease recurrence in that area.  Her other disease lesions are stable to decreased in size in the interval including decrease in the right adrenal gland lesion. I personally and independently reviewed the scan images and discussed the results with the patient and her husband. I discussed with the patient and her husband several options for management of her condition including continuation of the treatment with Nivolumab and close monitoring of the suspicious left paramediastinal lesion on the upcoming scan versus consideration of systemic chemotherapy versus palliative care and hospice referral.  After discussion of all the options the patient and her husband would like to continue with the same regimen for now with close monitoring of the suspicious lesion. The patient will proceed with cycle #4 of her treatment with nivolumab today. I will see her back for follow-up visit in 4 weeks for evaluation before starting cycle #5. The patient was strongly advised to call immediately if she has any concerning symptoms in the interval especially any increase in shortness of breath, cough or hemoptysis. The patient voices understanding of current disease status and treatment options and is in agreement with the current care plan. All questions were answered. The patient knows to call the clinic with any problems, questions or concerns. We can certainly see the patient much sooner if necessary.  Disclaimer: This note was dictated with voice recognition software. Similar sounding words can inadvertently be transcribed and may not be corrected upon  review.

## 2018-05-19 NOTE — Patient Instructions (Signed)
[facility] Discharge Instructions for Patients Receiving Chemotherapy  Today you received the following chemotherapy agents:  nivolumab (Opdivo)  To help prevent nausea and vomiting after your treatment, we encourage you to take your nausea medication as prescribed.   If you develop nausea and vomiting that is not controlled by your nausea medication, call the clinic.   BELOW ARE SYMPTOMS THAT SHOULD BE REPORTED IMMEDIATELY:  *FEVER GREATER THAN 100.5 F  *CHILLS WITH OR WITHOUT FEVER  NAUSEA AND VOMITING THAT IS NOT CONTROLLED WITH YOUR NAUSEA MEDICATION  *UNUSUAL SHORTNESS OF BREATH  *UNUSUAL BRUISING OR BLEEDING  TENDERNESS IN MOUTH AND THROAT WITH OR WITHOUT PRESENCE OF ULCERS  *URINARY PROBLEMS  *BOWEL PROBLEMS  UNUSUAL RASH Items with * indicate a potential emergency and should be followed up as soon as possible.  Feel free to call the clinic should you have any questions or concerns. The clinic phone number is [phone].  Please show the CHEMO ALERT CARD at check-in to the Emergency Department and triage nurse.   

## 2018-05-20 ENCOUNTER — Telehealth: Payer: Self-pay | Admitting: Internal Medicine

## 2018-05-20 ENCOUNTER — Ambulatory Visit: Payer: Medicare Other

## 2018-05-20 ENCOUNTER — Other Ambulatory Visit: Payer: Medicare Other

## 2018-05-20 ENCOUNTER — Ambulatory Visit: Payer: Medicare Other | Admitting: Oncology

## 2018-05-20 NOTE — Telephone Encounter (Signed)
Scheduled appt per 11/13 los - pt to get an updated schedule next visit.

## 2018-05-21 ENCOUNTER — Other Ambulatory Visit: Payer: Self-pay | Admitting: *Deleted

## 2018-05-25 ENCOUNTER — Encounter: Payer: Self-pay | Admitting: Internal Medicine

## 2018-06-02 ENCOUNTER — Telehealth: Payer: Self-pay | Admitting: Medical Oncology

## 2018-06-02 NOTE — Telephone Encounter (Signed)
Hemoptysis-yesterday coughed up small specks of blood x several episodes. Denies chest pain , sob or any other symptoms. She has coughed once today and there was no blood in her sputum. I instructed her to monitor for now and to GO to ED if increase in blood and or with sob, chest pain.

## 2018-06-04 ENCOUNTER — Other Ambulatory Visit: Payer: Self-pay | Admitting: Internal Medicine

## 2018-06-04 MED ORDER — DULOXETINE HCL 60 MG PO CPEP: 60.0000 mg | ORAL_CAPSULE | Freq: Every day | ORAL | 3 refills | Status: DC

## 2018-06-17 ENCOUNTER — Inpatient Hospital Stay: Payer: Medicare Other | Attending: Internal Medicine

## 2018-06-17 ENCOUNTER — Inpatient Hospital Stay (HOSPITAL_BASED_OUTPATIENT_CLINIC_OR_DEPARTMENT_OTHER): Payer: Medicare Other | Admitting: Internal Medicine

## 2018-06-17 ENCOUNTER — Encounter: Payer: Self-pay | Admitting: Internal Medicine

## 2018-06-17 ENCOUNTER — Inpatient Hospital Stay: Payer: Medicare Other

## 2018-06-17 ENCOUNTER — Telehealth: Payer: Self-pay

## 2018-06-17 ENCOUNTER — Telehealth: Payer: Self-pay | Admitting: Internal Medicine

## 2018-06-17 VITALS — BP 138/71 | HR 108 | Temp 98.1°F | Resp 20 | Ht 61.0 in | Wt 124.9 lb

## 2018-06-17 VITALS — HR 97

## 2018-06-17 DIAGNOSIS — L299 Pruritus, unspecified: Secondary | ICD-10-CM | POA: Diagnosis not present

## 2018-06-17 DIAGNOSIS — C3412 Malignant neoplasm of upper lobe, left bronchus or lung: Secondary | ICD-10-CM | POA: Insufficient documentation

## 2018-06-17 DIAGNOSIS — C7931 Secondary malignant neoplasm of brain: Secondary | ICD-10-CM | POA: Insufficient documentation

## 2018-06-17 DIAGNOSIS — R5382 Chronic fatigue, unspecified: Secondary | ICD-10-CM

## 2018-06-17 DIAGNOSIS — Z5112 Encounter for antineoplastic immunotherapy: Secondary | ICD-10-CM | POA: Diagnosis not present

## 2018-06-17 DIAGNOSIS — L989 Disorder of the skin and subcutaneous tissue, unspecified: Secondary | ICD-10-CM | POA: Diagnosis not present

## 2018-06-17 DIAGNOSIS — J90 Pleural effusion, not elsewhere classified: Secondary | ICD-10-CM | POA: Insufficient documentation

## 2018-06-17 DIAGNOSIS — R05 Cough: Secondary | ICD-10-CM

## 2018-06-17 DIAGNOSIS — R5383 Other fatigue: Secondary | ICD-10-CM

## 2018-06-17 DIAGNOSIS — E279 Disorder of adrenal gland, unspecified: Secondary | ICD-10-CM

## 2018-06-17 DIAGNOSIS — R0609 Other forms of dyspnea: Secondary | ICD-10-CM

## 2018-06-17 DIAGNOSIS — Z79899 Other long term (current) drug therapy: Secondary | ICD-10-CM | POA: Insufficient documentation

## 2018-06-17 DIAGNOSIS — R531 Weakness: Secondary | ICD-10-CM

## 2018-06-17 DIAGNOSIS — R110 Nausea: Secondary | ICD-10-CM

## 2018-06-17 LAB — CBC WITH DIFFERENTIAL (CANCER CENTER ONLY)
Abs Immature Granulocytes: 0.01 10*3/uL (ref 0.00–0.07)
BASOS PCT: 0 %
Basophils Absolute: 0 10*3/uL (ref 0.0–0.1)
EOS ABS: 0.1 10*3/uL (ref 0.0–0.5)
EOS PCT: 1 %
HCT: 38.6 % (ref 36.0–46.0)
HEMOGLOBIN: 12.6 g/dL (ref 12.0–15.0)
IMMATURE GRANULOCYTES: 0 %
Lymphocytes Relative: 27 %
Lymphs Abs: 1.7 10*3/uL (ref 0.7–4.0)
MCH: 33.9 pg (ref 26.0–34.0)
MCHC: 32.6 g/dL (ref 30.0–36.0)
MCV: 103.8 fL — ABNORMAL HIGH (ref 80.0–100.0)
MONO ABS: 0.6 10*3/uL (ref 0.1–1.0)
MONOS PCT: 10 %
NEUTROS ABS: 3.9 10*3/uL (ref 1.7–7.7)
NEUTROS PCT: 62 %
Platelet Count: 189 10*3/uL (ref 150–400)
RBC: 3.72 MIL/uL — ABNORMAL LOW (ref 3.87–5.11)
RDW: 12.8 % (ref 11.5–15.5)
WBC Count: 6.3 10*3/uL (ref 4.0–10.5)
nRBC: 0 % (ref 0.0–0.2)

## 2018-06-17 LAB — CMP (CANCER CENTER ONLY)
ALT: 7 U/L (ref 0–44)
AST: 20 U/L (ref 15–41)
Albumin: 3.4 g/dL — ABNORMAL LOW (ref 3.5–5.0)
Alkaline Phosphatase: 106 U/L (ref 38–126)
Anion gap: 7 (ref 5–15)
BILIRUBIN TOTAL: 0.8 mg/dL (ref 0.3–1.2)
BUN: 7 mg/dL — ABNORMAL LOW (ref 8–23)
CALCIUM: 9.3 mg/dL (ref 8.9–10.3)
CHLORIDE: 106 mmol/L (ref 98–111)
CO2: 26 mmol/L (ref 22–32)
Creatinine: 0.7 mg/dL (ref 0.44–1.00)
GFR, Est AFR Am: >60 mL/min (ref >60)
Glucose, Bld: 107 mg/dL — ABNORMAL HIGH (ref 70–99)
POTASSIUM: 3.9 mmol/L (ref 3.5–5.1)
Sodium: 139 mmol/L (ref 135–145)
TOTAL PROTEIN: 7.9 g/dL (ref 6.5–8.1)

## 2018-06-17 LAB — TSH: TSH: 0.76 u[IU]/mL (ref 0.308–3.960)

## 2018-06-17 MED ORDER — SODIUM CHLORIDE 0.9 % IV SOLN
480.0000 mg | Freq: Once | INTRAVENOUS | Status: AC
  Administered 2018-06-17: 480 mg via INTRAVENOUS
  Filled 2018-06-17: qty 48

## 2018-06-17 MED ORDER — SODIUM CHLORIDE 0.9 % IV SOLN
Freq: Once | INTRAVENOUS | Status: AC
  Administered 2018-06-17: 11:00:00 via INTRAVENOUS
  Filled 2018-06-17: qty 250

## 2018-06-17 NOTE — Progress Notes (Signed)
[place] [phone]   Fax:(336) [place] [address]  DIAGNOSIS: stage IV(T2b, N3, M1c)  lung cancer pending further staging workup and tissue diagnosis. The patient presented with large left upper lobe lung mass in addition to left hilar and mediastinal lymphadenopathy and left true vocal cord paralysis.  PRIOR THERAPY:  1) Palliative radiotherapy to the large left upper lobe lung mass as well as metastatic brain lesions. 2)  systemic chemotherapy with carboplatin for AUC of 5 and paclitaxel 175 MG/M2 every 3 weeks. First dose on 04/15/2017.  Status post 6 cycles.  Paclitaxel has been reduced to 150 mg/M2 starting from cycle #4 secondary to peripheral neuropathy.  Last dose of chemotherapy was given July 29, 2017.  CURRENT THERAPY: Second line treatment with immunotherapy with Nivolumab 480 mg IV every 4 weeks.  First dose of February 25, 2018.  Status post 4 cycles.  INTERVAL HISTORY: Jacqueline Kidd 77 y.o. female returns to the clinic today for follow-up visit accompanied by her husband.  The patient is feeling fine today with no specific complaints except for the persistent fatigue and generalized weakness.  She also continues to have mild cough with blood-tinged sputum but much better than before.  She denied having any chest pain but has shortness of breath with exertion.  She denied having any recent weight loss or night sweats.  She has no nausea, vomiting, diarrhea or constipation.  The patient is here today for evaluation before starting cycle #5 of her treatment.  MEDICAL HISTORY: Past Medical History:  Diagnosis Date  . Chronic low back pain   . COPD (chronic obstructive pulmonary disease) ([place])   . GERD (gastroesophageal reflux disease)   . Hyperlipidemia   . Hypertension   . Lesion of left lung   . lung ca dx'd 01/2017  . Osteoporosis     ALLERGIES:  is allergic to  fosamax [alendronate]; hydrocodone-acetaminophen; and lisinopril-hydrochlorothiazide.  MEDICATIONS:  Current Outpatient Medications  Medication Sig Dispense Refill  . acetaminophen (TYLENOL) 325 MG tablet Take 650 mg by mouth every 6 (six) hours as needed for mild pain or moderate pain.     . Albuterol Sulfate 108 (90 Base) MCG/ACT AEPB Inhale 2 puffs into the lungs every 6 (six) hours as needed. (Patient not taking: Reported on 05/19/2018) 1 each 0  . DULoxetine (CYMBALTA) 60 MG capsule Take 1 capsule (60 mg total) by mouth daily. 30 capsule 3  . gabapentin (NEURONTIN) 400 MG capsule Take 2 capsules (800 mg total) by mouth 3 (three) times daily. (Patient not taking: Reported on 05/19/2018) 270 capsule 3  . LORazepam (ATIVAN) 0.5 MG tablet Take 1 tablet (0.5 mg total) by mouth as needed for anxiety (take one tablet 30 minutes prior to MRI scans and radiation treatment). (Patient not taking: Reported on 04/22/2018) 10 tablet 0  . pregabalin (LYRICA) 75 MG capsule Take 1 capsule (75 mg total) by mouth 2 (two) times daily. 60 capsule 3   No current facility-administered medications for this visit.     SURGICAL HISTORY:  Past Surgical History:  Procedure Laterality Date  . ABDOMINAL HYSTERECTOMY    . TOTAL HIP ARTHROPLASTY     right    REVIEW OF SYSTEMS:  A comprehensive review of systems was negative except for: Constitutional: positive for fatigue Respiratory: positive for cough and dyspnea on exertion Gastrointestinal: positive for nausea Musculoskeletal: positive for muscle weakness  PHYSICAL EXAMINATION: General appearance: alert, cooperative, fatigued and no distress Head: Normocephalic, without obvious abnormality, atraumatic Neck: no adenopathy, no JVD, supple, symmetrical, trachea midline and thyroid not enlarged, symmetric, no tenderness/mass/nodules Lymph nodes: Cervical, supraclavicular, and axillary nodes normal. Resp: clear to auscultation bilaterally Back: symmetric, no  curvature. ROM normal. No CVA tenderness. Cardio: regular rate and rhythm, S1, S2 normal, no murmur, click, rub or gallop GI: soft, non-tender; bowel sounds normal; no masses,  no organomegaly Extremities: extremities normal, atraumatic, no cyanosis or edema  ECOG PERFORMANCE STATUS: 1 - Symptomatic but completely ambulatory  Blood pressure 138/71, pulse (!) 108, temperature 98.1 F (36.7 C), temperature source Oral, resp. rate 20, height 5\' 1"  (1.549 m), weight 124 lb 14.4 oz (56.7 kg), SpO2 98 %.  LABORATORY DATA: Lab Results  Component Value Date   WBC 6.3 06/17/2018   HGB 12.6 06/17/2018   HCT 38.6 06/17/2018   MCV 103.8 (H) 06/17/2018   PLT 189 06/17/2018      Chemistry      Component Value Date/Time   NA 139 05/19/2018 1154   NA 138 07/08/2017 0831   K 4.0 05/19/2018 1154   K 3.3 (L) 07/08/2017 0831   CL 106 05/19/2018 1154   CO2 25 05/19/2018 1154   CO2 24 07/08/2017 0831   BUN 6 (L) 05/19/2018 1154   BUN 4.5 (L) 07/08/2017 0831   CREATININE 0.65 05/19/2018 1154   CREATININE 0.6 07/08/2017 0831      Component Value Date/Time   CALCIUM 8.9 05/19/2018 1154   CALCIUM 9.0 07/08/2017 0831   ALKPHOS 99 05/19/2018 1154   ALKPHOS 119 07/08/2017 0831   AST 20 05/19/2018 1154   AST 25 07/08/2017 0831   ALT 10 05/19/2018 1154   ALT 16 07/08/2017 0831   BILITOT 0.6 05/19/2018 1154   BILITOT 0.52 07/08/2017 0831       RADIOGRAPHIC STUDIES: No results found.  ASSESSMENT AND PLAN: This is a very pleasant 77 years old white female with stage IV (T2b, N3, M1 C) non-small cell lung cancer, squamous cell carcinoma presented with large left upper lobe lung mass in addition to mediastinal and supraclavicular lymphadenopathy as well as metastatic brain lesions diagnosed in September 2018. The patient underwent a course of palliative radiotherapy to the left upper lobe lung mass as well as the metastatic brain lesions. She is currently undergoing palliative systemic  chemotherapy with carboplatin for AUC of 5 and paclitaxel 175 mg/M2 every 3 weeks, status post 6 cycles. She has been in observation for several months. Repeat CT scan of the chest, abdomen and pelvis showed evidence for disease progression with enlargement of right adrenal nodule in addition to progressive enlargement of small left pleural effusion in addition to the right parietal scalp lesion.  The patient also was found to have evidence for progressive disease in the brain. She was started on second line treatment with immunotherapy was Nivolumab 480 mg IV every 4 weeks status post 4 cycles. The patient has been tolerating this treatment well with no concerning adverse effects. I recommended for her to continue with cycle #5 today as scheduled. For the nausea, she will continue with her current nausea medication with Compazine at home. I will see her back for follow-up visit in 4 weeks for evaluation before starting cycle #6. The patient was advised to call immediately if she has any concerning symptoms in the interval. The patient voices understanding of current disease status and treatment options and is in agreement with the  current care plan. All questions were answered. The patient knows to call the clinic with any problems, questions or concerns. We can certainly see the patient much sooner if necessary.  Disclaimer: This note was dictated with voice recognition software. Similar sounding words can inadvertently be transcribed and may not be corrected upon review.

## 2018-06-17 NOTE — Patient Instructions (Signed)
[facility] Discharge Instructions for Patients Receiving Chemotherapy  Today you received the following chemotherapy agents :  Opdivo.  To help prevent nausea and vomiting after your treatment, we encourage you to take your nausea medication as prescribed.   If you develop nausea and vomiting that is not controlled by your nausea medication, call the clinic.   BELOW ARE SYMPTOMS THAT SHOULD BE REPORTED IMMEDIATELY:  *FEVER GREATER THAN 100.5 F  *CHILLS WITH OR WITHOUT FEVER  NAUSEA AND VOMITING THAT IS NOT CONTROLLED WITH YOUR NAUSEA MEDICATION  *UNUSUAL SHORTNESS OF BREATH  *UNUSUAL BRUISING OR BLEEDING  TENDERNESS IN MOUTH AND THROAT WITH OR WITHOUT PRESENCE OF ULCERS  *URINARY PROBLEMS  *BOWEL PROBLEMS  UNUSUAL RASH Items with * indicate a potential emergency and should be followed up as soon as possible.  Feel free to call the clinic should you have any questions or concerns. The clinic phone number is [phone].  Please show the CHEMO ALERT CARD at check-in to the Emergency Department and triage nurse.   

## 2018-06-17 NOTE — Telephone Encounter (Signed)
Was unable to speak to the patient concerning r/s her 1/9 appointment. Due to [facility] (full) and infusion schedule. Per 12/12 return voice msg calls

## 2018-06-17 NOTE — Telephone Encounter (Signed)
Printed calendar and avs.  Per 12/12 los appointments for 1/09 was already scheduled.

## 2018-07-15 ENCOUNTER — Encounter: Payer: Self-pay | Admitting: Internal Medicine

## 2018-07-15 ENCOUNTER — Inpatient Hospital Stay: Payer: Medicare Other

## 2018-07-15 ENCOUNTER — Inpatient Hospital Stay: Payer: Medicare Other | Attending: Internal Medicine

## 2018-07-15 ENCOUNTER — Telehealth: Payer: Self-pay

## 2018-07-15 ENCOUNTER — Other Ambulatory Visit: Payer: Medicare Other

## 2018-07-15 ENCOUNTER — Inpatient Hospital Stay (HOSPITAL_BASED_OUTPATIENT_CLINIC_OR_DEPARTMENT_OTHER): Payer: Medicare Other | Admitting: Internal Medicine

## 2018-07-15 DIAGNOSIS — R5382 Chronic fatigue, unspecified: Secondary | ICD-10-CM

## 2018-07-15 DIAGNOSIS — C3490 Malignant neoplasm of unspecified part of unspecified bronchus or lung: Secondary | ICD-10-CM

## 2018-07-15 DIAGNOSIS — L989 Disorder of the skin and subcutaneous tissue, unspecified: Secondary | ICD-10-CM | POA: Diagnosis not present

## 2018-07-15 DIAGNOSIS — Z5112 Encounter for antineoplastic immunotherapy: Secondary | ICD-10-CM | POA: Diagnosis not present

## 2018-07-15 DIAGNOSIS — J90 Pleural effusion, not elsewhere classified: Secondary | ICD-10-CM | POA: Insufficient documentation

## 2018-07-15 DIAGNOSIS — Z79899 Other long term (current) drug therapy: Secondary | ICD-10-CM | POA: Insufficient documentation

## 2018-07-15 DIAGNOSIS — C7931 Secondary malignant neoplasm of brain: Secondary | ICD-10-CM | POA: Diagnosis not present

## 2018-07-15 DIAGNOSIS — R05 Cough: Secondary | ICD-10-CM

## 2018-07-15 DIAGNOSIS — E279 Disorder of adrenal gland, unspecified: Secondary | ICD-10-CM | POA: Diagnosis not present

## 2018-07-15 DIAGNOSIS — M25511 Pain in right shoulder: Secondary | ICD-10-CM | POA: Diagnosis not present

## 2018-07-15 DIAGNOSIS — R0602 Shortness of breath: Secondary | ICD-10-CM

## 2018-07-15 DIAGNOSIS — C3411 Malignant neoplasm of upper lobe, right bronchus or lung: Secondary | ICD-10-CM | POA: Diagnosis not present

## 2018-07-15 DIAGNOSIS — C3412 Malignant neoplasm of upper lobe, left bronchus or lung: Secondary | ICD-10-CM | POA: Diagnosis not present

## 2018-07-15 LAB — CMP (CANCER CENTER ONLY)
ALT: 11 U/L (ref 0–44)
AST: 20 U/L (ref 15–41)
Albumin: 3.2 g/dL — ABNORMAL LOW (ref 3.5–5.0)
Alkaline Phosphatase: 94 U/L (ref 38–126)
Anion gap: 8 (ref 5–15)
BUN: 7 mg/dL — ABNORMAL LOW (ref 8–23)
CALCIUM: 9 mg/dL (ref 8.9–10.3)
CO2: 26 mmol/L (ref 22–32)
Chloride: 104 mmol/L (ref 98–111)
Creatinine: 0.69 mg/dL (ref 0.44–1.00)
GFR, Est AFR Am: >60 mL/min (ref >60)
Glucose, Bld: 103 mg/dL — ABNORMAL HIGH (ref 70–99)
Potassium: 4 mmol/L (ref 3.5–5.1)
Sodium: 138 mmol/L (ref 135–145)
Total Bilirubin: 0.6 mg/dL (ref 0.3–1.2)
Total Protein: 7.3 g/dL (ref 6.5–8.1)

## 2018-07-15 LAB — CBC WITH DIFFERENTIAL (CANCER CENTER ONLY)
Abs Immature Granulocytes: 0.02 10*3/uL (ref 0.00–0.07)
BASOS PCT: 0 %
Basophils Absolute: 0 10*3/uL (ref 0.0–0.1)
EOS ABS: 0.1 10*3/uL (ref 0.0–0.5)
Eosinophils Relative: 2 %
HCT: 36.4 % (ref 36.0–46.0)
Hemoglobin: 11.7 g/dL — ABNORMAL LOW (ref 12.0–15.0)
Immature Granulocytes: 0 %
Lymphocytes Relative: 26 %
Lymphs Abs: 1.6 10*3/uL (ref 0.7–4.0)
MCH: 34 pg (ref 26.0–34.0)
MCHC: 32.1 g/dL (ref 30.0–36.0)
MCV: 105.8 fL — ABNORMAL HIGH (ref 80.0–100.0)
Monocytes Absolute: 0.6 10*3/uL (ref 0.1–1.0)
Monocytes Relative: 10 %
Neutro Abs: 3.8 10*3/uL (ref 1.7–7.7)
Neutrophils Relative %: 62 %
Platelet Count: 185 10*3/uL (ref 150–400)
RBC: 3.44 MIL/uL — AB (ref 3.87–5.11)
RDW: 13.1 % (ref 11.5–15.5)
WBC: 6.3 10*3/uL (ref 4.0–10.5)
nRBC: 0 % (ref 0.0–0.2)

## 2018-07-15 LAB — TSH: TSH: 0.743 u[IU]/mL (ref 0.308–3.960)

## 2018-07-15 MED ORDER — SODIUM CHLORIDE 0.9 % IV SOLN
Freq: Once | INTRAVENOUS | Status: AC
  Administered 2018-07-15: 11:00:00 via INTRAVENOUS
  Filled 2018-07-15: qty 250

## 2018-07-15 MED ORDER — SODIUM CHLORIDE 0.9 % IV SOLN
480.0000 mg | Freq: Once | INTRAVENOUS | Status: AC
  Administered 2018-07-15: 480 mg via INTRAVENOUS
  Filled 2018-07-15: qty 48

## 2018-07-15 NOTE — Patient Instructions (Signed)
[facility] Discharge Instructions for Patients Receiving Chemotherapy  Today you received the following chemotherapy agents:  nivolumab (Opdivo)  To help prevent nausea and vomiting after your treatment, we encourage you to take your nausea medication as prescribed.   If you develop nausea and vomiting that is not controlled by your nausea medication, call the clinic.   BELOW ARE SYMPTOMS THAT SHOULD BE REPORTED IMMEDIATELY:  *FEVER GREATER THAN 100.5 F  *CHILLS WITH OR WITHOUT FEVER  NAUSEA AND VOMITING THAT IS NOT CONTROLLED WITH YOUR NAUSEA MEDICATION  *UNUSUAL SHORTNESS OF BREATH  *UNUSUAL BRUISING OR BLEEDING  TENDERNESS IN MOUTH AND THROAT WITH OR WITHOUT PRESENCE OF ULCERS  *URINARY PROBLEMS  *BOWEL PROBLEMS  UNUSUAL RASH Items with * indicate a potential emergency and should be followed up as soon as possible.  Feel free to call the clinic should you have any questions or concerns. The clinic phone number is [phone].  Please show the CHEMO ALERT CARD at check-in to the Emergency Department and triage nurse.   

## 2018-07-15 NOTE — Progress Notes (Signed)
[place] Telephone:([phone]   Fax:([phone]) [place] [address]  DIAGNOSIS: stage IV(T2b, N3, M1c)  lung cancer pending further staging workup and tissue diagnosis. The patient presented with large left upper lobe lung mass in addition to left hilar and mediastinal lymphadenopathy and left true vocal cord paralysis.  PRIOR THERAPY:  1) Palliative radiotherapy to the large left upper lobe lung mass as well as metastatic brain lesions. 2)  systemic chemotherapy with carboplatin for AUC of 5 and paclitaxel 175 MG/M2 every 3 weeks. First dose on 04/15/2017.  Status post 6 cycles.  Paclitaxel has been reduced to 150 mg/M2 starting from cycle #4 secondary to peripheral neuropathy.  Last dose of chemotherapy was given July 29, 2017.  CURRENT THERAPY: Second line treatment with immunotherapy with Nivolumab 480 mg IV every 4 weeks.  First dose of February 25, 2018.  Status post 5 cycles.  INTERVAL HISTORY: Jacqueline Kidd 78 y.o. female returns to the clinic today for follow-up visit accompanied by her husband.  The patient is feeling fine today with no concerning complaints except for persistent right shoulder pain that has been going on for 2 years after a fall.  She also has shortness of breath with mild cough but no significant chest pain.  She denied having any fever or chills.  She has no nausea, vomiting, diarrhea or constipation.  She continues to tolerate her treatment with nivolumab fairly well.  The patient is here today for evaluation before starting cycle #6.  MEDICAL HISTORY: Past Medical History:  Diagnosis Date  . Chronic low back pain   . COPD (chronic obstructive pulmonary disease) ([place])   . GERD (gastroesophageal reflux disease)   . Hyperlipidemia   . Hypertension   . Lesion of left lung   . lung ca dx'd 01/2017  . Osteoporosis     ALLERGIES:  is allergic to fosamax [alendronate];  hydrocodone-acetaminophen; and lisinopril-hydrochlorothiazide.  MEDICATIONS:  Current Outpatient Medications  Medication Sig Dispense Refill  . acetaminophen (TYLENOL) 325 MG tablet Take 650 mg by mouth every 6 (six) hours as needed for mild pain or moderate pain.     . Albuterol Sulfate 108 (90 Base) MCG/ACT AEPB Inhale 2 puffs into the lungs every 6 (six) hours as needed. 1 each 0  . DULoxetine (CYMBALTA) 60 MG capsule Take 1 capsule (60 mg total) by mouth daily. 30 capsule 3  . gabapentin (NEURONTIN) 400 MG capsule Take 2 capsules (800 mg total) by mouth 3 (three) times daily. (Patient not taking: Reported on 05/19/2018) 270 capsule 3  . LORazepam (ATIVAN) 0.5 MG tablet Take 1 tablet (0.5 mg total) by mouth as needed for anxiety (take one tablet 30 minutes prior to MRI scans and radiation treatment). (Patient not taking: Reported on 04/22/2018) 10 tablet 0  . prochlorperazine (COMPAZINE) 10 MG tablet Take 10 mg by mouth every 6 (six) hours as needed for nausea or vomiting.     No current facility-administered medications for this visit.     SURGICAL HISTORY:  Past Surgical History:  Procedure Laterality Date  . ABDOMINAL HYSTERECTOMY    . TOTAL HIP ARTHROPLASTY     right    REVIEW OF SYSTEMS:  A comprehensive review of systems was negative except for: Constitutional: positive for fatigue Respiratory: positive for cough and dyspnea on exertion Musculoskeletal: positive for arthralgias and muscle weakness   PHYSICAL EXAMINATION: General appearance: alert,  cooperative, fatigued and no distress Head: Normocephalic, without obvious abnormality, atraumatic Neck: no adenopathy, no JVD, supple, symmetrical, trachea midline and thyroid not enlarged, symmetric, no tenderness/mass/nodules Lymph nodes: Cervical, supraclavicular, and axillary nodes normal. Resp: clear to auscultation bilaterally Back: symmetric, no curvature. ROM normal. No CVA tenderness. Cardio: regular rate and rhythm, S1,  S2 normal, no murmur, click, rub or gallop GI: soft, non-tender; bowel sounds normal; no masses,  no organomegaly Extremities: extremities normal, atraumatic, no cyanosis or edema  ECOG PERFORMANCE STATUS: 1 - Symptomatic but completely ambulatory  Blood pressure 120/75, pulse 99, temperature 98 F (36.7 C), temperature source Oral, resp. rate 20, height 5\' 1"  (1.549 m), weight 125 lb 8 oz (56.9 kg), SpO2 99 %.  LABORATORY DATA: Lab Results  Component Value Date   WBC 6.3 07/15/2018   HGB 11.7 (L) 07/15/2018   HCT 36.4 07/15/2018   MCV 105.8 (H) 07/15/2018   PLT 185 07/15/2018      Chemistry      Component Value Date/Time   NA 138 07/15/2018 0857   NA 138 07/08/2017 0831   K 4.0 07/15/2018 0857   K 3.3 (L) 07/08/2017 0831   CL 104 07/15/2018 0857   CO2 26 07/15/2018 0857   CO2 24 07/08/2017 0831   BUN 7 (L) 07/15/2018 0857   BUN 4.5 (L) 07/08/2017 0831   CREATININE 0.69 07/15/2018 0857   CREATININE 0.6 07/08/2017 0831      Component Value Date/Time   CALCIUM 9.0 07/15/2018 0857   CALCIUM 9.0 07/08/2017 0831   ALKPHOS 94 07/15/2018 0857   ALKPHOS 119 07/08/2017 0831   AST 20 07/15/2018 0857   AST 25 07/08/2017 0831   ALT 11 07/15/2018 0857   ALT 16 07/08/2017 0831   BILITOT 0.6 07/15/2018 0857   BILITOT 0.52 07/08/2017 0831       RADIOGRAPHIC STUDIES: No results found.  ASSESSMENT AND PLAN: This is a very pleasant 78 years old white female with stage IV (T2b, N3, M1 C) non-small cell lung cancer, squamous cell carcinoma presented with large left upper lobe lung mass in addition to mediastinal and supraclavicular lymphadenopathy as well as metastatic brain lesions diagnosed in September 2018. The patient underwent a course of palliative radiotherapy to the left upper lobe lung mass as well as the metastatic brain lesions. She is currently undergoing palliative systemic chemotherapy with carboplatin for AUC of 5 and paclitaxel 175 mg/M2 every 3 weeks, status post 6  cycles. She has been in observation for several months. Repeat CT scan of the chest, abdomen and pelvis showed evidence for disease progression with enlargement of right adrenal nodule in addition to progressive enlargement of small left pleural effusion in addition to the right parietal scalp lesion.  The patient also was found to have evidence for progressive disease in the brain. She was started on second line treatment with immunotherapy was Nivolumab 480 mg IV every 4 weeks status post 5 cycles. She has been tolerating this treatment well with no concerning adverse effects. I recommended for the patient to proceed with cycle #6 today as scheduled. I will see her back for follow-up visit in 4 weeks for evaluation after repeating CT scan of the chest, abdomen and pelvis for restaging of her disease. The patient was advised to call immediately if she has any concerning symptoms in the interval. The patient voices understanding of current disease status and treatment options and is in agreement with the current care plan. All questions were answered. The patient  knows to call the clinic with any problems, questions or concerns. We can certainly see the patient much sooner if necessary.  Disclaimer: This note was dictated with voice recognition software. Similar sounding words can inadvertently be transcribed and may not be corrected upon review.

## 2018-07-15 NOTE — Telephone Encounter (Signed)
Printed avs and calender of upcoming appointment. Per 1/9 los

## 2018-07-21 ENCOUNTER — Encounter: Payer: Self-pay | Admitting: Internal Medicine

## 2018-07-21 ENCOUNTER — Other Ambulatory Visit: Payer: Self-pay | Admitting: Radiation Therapy

## 2018-07-22 ENCOUNTER — Ambulatory Visit: Payer: Medicare Other | Admitting: Internal Medicine

## 2018-07-23 ENCOUNTER — Ambulatory Visit
Admission: RE | Admit: 2018-07-23 | Discharge: 2018-07-23 | Disposition: A | Discharge status: Home / Self Care | Payer: Medicare Other | Source: Ambulatory Visit | Attending: Internal Medicine | Admitting: Internal Medicine

## 2018-07-23 DIAGNOSIS — C7931 Secondary malignant neoplasm of brain: Secondary | ICD-10-CM

## 2018-07-23 DIAGNOSIS — C3490 Malignant neoplasm of unspecified part of unspecified bronchus or lung: Secondary | ICD-10-CM | POA: Diagnosis not present

## 2018-07-23 MED ORDER — GADOBENATE DIMEGLUMINE 529 MG/ML IV SOLN
10.0000 mL | Freq: Once | INTRAVENOUS | Status: AC | PRN
  Administered 2018-07-23: 10 mL via INTRAVENOUS

## 2018-07-26 ENCOUNTER — Telehealth: Payer: Self-pay | Admitting: Medical Oncology

## 2018-07-26 NOTE — Telephone Encounter (Signed)
Returned call -pt said scan is no scheduled.

## 2018-07-27 ENCOUNTER — Inpatient Hospital Stay (HOSPITAL_BASED_OUTPATIENT_CLINIC_OR_DEPARTMENT_OTHER): Payer: Medicare Other | Admitting: Internal Medicine

## 2018-07-27 ENCOUNTER — Telehealth: Payer: Self-pay | Admitting: Internal Medicine

## 2018-07-27 ENCOUNTER — Encounter: Payer: Self-pay | Admitting: Internal Medicine

## 2018-07-27 ENCOUNTER — Telehealth: Payer: Self-pay | Admitting: Oncology

## 2018-07-27 VITALS — BP 121/67 | HR 97 | Temp 97.6°F | Resp 18 | Ht 61.0 in | Wt 122.9 lb

## 2018-07-27 DIAGNOSIS — Z5112 Encounter for antineoplastic immunotherapy: Secondary | ICD-10-CM | POA: Diagnosis not present

## 2018-07-27 DIAGNOSIS — C7931 Secondary malignant neoplasm of brain: Secondary | ICD-10-CM | POA: Diagnosis not present

## 2018-07-27 DIAGNOSIS — G629 Polyneuropathy, unspecified: Secondary | ICD-10-CM

## 2018-07-27 DIAGNOSIS — E279 Disorder of adrenal gland, unspecified: Secondary | ICD-10-CM | POA: Diagnosis not present

## 2018-07-27 DIAGNOSIS — L989 Disorder of the skin and subcutaneous tissue, unspecified: Secondary | ICD-10-CM | POA: Diagnosis not present

## 2018-07-27 DIAGNOSIS — Z79899 Other long term (current) drug therapy: Secondary | ICD-10-CM | POA: Diagnosis not present

## 2018-07-27 DIAGNOSIS — C3412 Malignant neoplasm of upper lobe, left bronchus or lung: Secondary | ICD-10-CM | POA: Diagnosis not present

## 2018-07-27 DIAGNOSIS — M25511 Pain in right shoulder: Secondary | ICD-10-CM | POA: Diagnosis not present

## 2018-07-27 DIAGNOSIS — J90 Pleural effusion, not elsewhere classified: Secondary | ICD-10-CM | POA: Diagnosis not present

## 2018-07-27 MED ORDER — GABAPENTIN 400 MG PO CAPS: 800.0000 mg | ORAL_CAPSULE | Freq: Every day | ORAL | 3 refills | Status: DC

## 2018-07-27 NOTE — Progress Notes (Signed)
[facility] at [place], [place] [address] [phone]   Interval Evaluation  Date of Service: 07/27/18 Patient Name: Jacqueline Kidd Patient MRN: [id number] Patient DOB: 1941-01-18 Provider: Ventura Sellers, MD  Identifying Statement:  Jacqueline Kidd is a 78 y.o. female with No primary diagnosis found., neuropathy  Primary Cancer: [place] Lung Stage IV  CNS Oncology History: 04/03/17: SRS to 2 supratentorial lesions 10/22/17: SRS to 3 (new) supratentorial lesions 02/05/18: SRS to 3 additional sub-centimeter metastases  Interval History:  Jacqueline Kidd presents today after recent brain MRI.  She continues to describe painful neuropathy in her hands and feet.  None of the drugs she has taken have helped the pain.  At this time she is taking Gabapentin 400mg  TID because "it helps me sleep". No new neurologic deficits, seizures or headaches.  Prior: She describes impaired sensation in her fingers, as well as her feet and lower legs.  She doesn't describe frank pain, but there is "pins and needles" component.  Hands feel like "sand or sandpaper".  She has trouble locating where her feet are underneath her.  Symptoms began while undergoing cycles of chemotherapy for lung cancer, notably with platinum and taxol based treatments.  Despite dose reduction and eventual completion of therapy, her symptoms have remained.  She doesn't describe any recent worsening.  Walks on her own but getting around "is harder because of my feet".    Medications: Current Outpatient Medications on File Prior to Visit  Medication Sig Dispense Refill  . acetaminophen (TYLENOL) 325 MG tablet Take 650 mg by mouth every 6 (six) hours as needed for mild pain or moderate pain.     . Albuterol Sulfate 108 (90 Base) MCG/ACT AEPB Inhale 2 puffs into the lungs every 6 (six) hours as needed. 1 each 0  . DULoxetine (CYMBALTA) 60 MG capsule Take 1 capsule (60 mg total) by mouth  daily. 30 capsule 3  . gabapentin (NEURONTIN) 400 MG capsule Take 2 capsules (800 mg total) by mouth 3 (three) times daily. (Patient not taking: Reported on 05/19/2018) 270 capsule 3  . LORazepam (ATIVAN) 0.5 MG tablet Take 1 tablet (0.5 mg total) by mouth as needed for anxiety (take one tablet 30 minutes prior to MRI scans and radiation treatment). (Patient not taking: Reported on 04/22/2018) 10 tablet 0  . prochlorperazine (COMPAZINE) 10 MG tablet Take 10 mg by mouth every 6 (six) hours as needed for nausea or vomiting.     No current facility-administered medications on file prior to visit.     Allergies:  Allergies  Allergen Reactions  . Fosamax [Alendronate]   . Hydrocodone-Acetaminophen Nausea And Vomiting    EXTREME VOMITING   . Lisinopril-Hydrochlorothiazide Other (See Comments)    Chest spasms   Past Medical History:  Past Medical History:  Diagnosis Date  . Chronic low back pain   . COPD (chronic obstructive pulmonary disease) ([place])   . GERD (gastroesophageal reflux disease)   . Hyperlipidemia   . Hypertension   . Lesion of left lung   . lung ca dx'd 01/2017  . Osteoporosis    Past Surgical History:  Past Surgical History:  Procedure Laterality Date  . ABDOMINAL HYSTERECTOMY    . TOTAL HIP ARTHROPLASTY     right   Social History:  Social History   Socioeconomic History  . Marital status: Married    Spouse name: Not on file  . Number of children: Not on  file  . Years of education: Not on file  . Highest education level: Not on file  Occupational History  . Not on file  Social Needs  . Financial resource strain: Not on file  . Food insecurity:    Worry: Not on file    Inability: Not on file  . Transportation needs:    Medical: Not on file    Non-medical: Not on file  Tobacco Use  . Smoking status: Former Smoker    Packs/day: 1.00    Years: 57.00    Pack years: 57.00    Last attempt to quit: 02/16/2017    Years since quitting: 1.4  . Smokeless  tobacco: Never Used  Substance and Sexual Activity  . Alcohol use: No  . Drug use: No  . Sexual activity: Never  Lifestyle  . Physical activity:    Days per week: Not on file    Minutes per session: Not on file  . Stress: Not on file  Relationships  . Social connections:    Talks on phone: Not on file    Gets together: Not on file    Attends religious service: Not on file    Active member of club or organization: Not on file    Attends meetings of clubs or organizations: Not on file    Relationship status: Not on file  . Intimate partner violence:    Fear of current or ex partner: Not on file    Emotionally abused: Not on file    Physically abused: Not on file    Forced sexual activity: Not on file  Other Topics Concern  . Not on file  Social History Narrative   03-09-08 Unable to ask husband with her today.   Family History:  Family History  Problem Relation Age of Onset  . Cancer Mother        breast  . Cancer Father        esophageal  . Cancer Sister        breast    Review of Systems: Constitutional: Denies fevers, chills or abnormal weight loss Eyes: Denies blurriness of vision Ears, nose, mouth, throat, and face: Denies mucositis or sore throat Respiratory: Denies cough, dyspnea or wheezes Cardiovascular: Denies palpitation, chest discomfort or lower extremity swelling Gastrointestinal:  Denies nausea, constipation, diarrhea GU: Denies dysuria or incontinence Skin: Denies abnormal skin rashes Neurological: Per HPI Musculoskeletal: Denies joint pain, back or neck discomfort. No decrease in ROM Behavioral/Psych: +depressed mood   Physical Exam: Encounter date 02/18/18 02/18/18 12/01/17  Last reading 11:25 AM 10:52 AM 12:18 PM  BP -- 130/79 117/75  Pulse Rate -- 88 96  Resp -- 18 17  Temp -- 98.2 F (36.8 C) 97.8 F (36.6 C)  Temp Source -- Oral Oral  SpO2 -- 96 % 95 %  Weight 129 lb 4.8 oz (58.7 kg) 129 lb (58.5 kg) 128 lb 8 oz (58.3 kg)  Height -- 5'  1" (1.549 m)     KPS: 80. General: Alert, cooperative, pleasant, in no acute distress Head: Craniotomy scar noted, dry and intact. EENT: No conjunctival injection or scleral icterus. Oral mucosa moist Lungs: Resp effort normal Cardiac: Regular rate and rhythm Abdomen: Soft, non-distended abdomen Skin: No rashes cyanosis or petechiae. Extremities: No clubbing or edema  Neurologic Exam: Mental Status: Awake, alert, attentive to examiner. Oriented to self and environment. Language is fluent with intact comprehension.  Cranial Nerves: Visual acuity is grossly normal. Visual fields are full. Extra-ocular movements  intact. No ptosis. Face is symmetric, tongue midline. Motor: Tone and bulk are normal. Power is full in both arms and legs. Reflexes are absent or trace throughout, no pathologic reflexes present. Intact finger to nose bilaterally Sensory: Impaired to pain/temp in distal fingers and lower legs to mid shins bilaterally Gait: Mild sensory dystaxia  Labs: I have reviewed the data as listed    Component Value Date/Time   NA 138 07/15/2018 0857   NA 138 07/08/2017 0831   K 4.0 07/15/2018 0857   K 3.3 (L) 07/08/2017 0831   CL 104 07/15/2018 0857   CO2 26 07/15/2018 0857   CO2 24 07/08/2017 0831   GLUCOSE 103 (H) 07/15/2018 0857   GLUCOSE 127 07/08/2017 0831   BUN 7 (L) 07/15/2018 0857   BUN 4.5 (L) 07/08/2017 0831   CREATININE 0.69 07/15/2018 0857   CREATININE 0.6 07/08/2017 0831   CALCIUM 9.0 07/15/2018 0857   CALCIUM 9.0 07/08/2017 0831   PROT 7.3 07/15/2018 0857   PROT 7.0 07/08/2017 0831   ALBUMIN 3.2 (L) 07/15/2018 0857   ALBUMIN 2.9 (L) 07/08/2017 0831   AST 20 07/15/2018 0857   AST 25 07/08/2017 0831   ALT 11 07/15/2018 0857   ALT 16 07/08/2017 0831   ALKPHOS 94 07/15/2018 0857   ALKPHOS 119 07/08/2017 0831   BILITOT 0.6 07/15/2018 0857   BILITOT 0.52 07/08/2017 0831   GFRNONAA >60 07/15/2018 0857   GFRNONAA 76 01/21/2013 1526   GFRAA >60 07/15/2018 0857    GFRAA 88 01/21/2013 1526   Lab Results  Component Value Date   WBC 6.3 07/15/2018   NEUTROABS 3.8 07/15/2018   HGB 11.7 (L) 07/15/2018   HCT 36.4 07/15/2018   MCV 105.8 (H) 07/15/2018   PLT 185 07/15/2018      Imaging:  [place] Clinician Interpretation: I have personally reviewed the CNS images as listed.  My interpretation, in the context of the patient's clinical presentation, is likely treatment effect  Mr [name] Wo Contrast  Result Date: 07/23/2018 CLINICAL DATA:  Restaging of metastatic lung cancer. Multiple brain metastases treated with SRS in 03/2017, 10/2017, and 02/2018. EXAM: MRI HEAD WITHOUT AND WITH CONTRAST TECHNIQUE: Multiplanar, multiecho pulse sequences of the brain and surrounding structures were obtained without and with intravenous contrast. CONTRAST:  23mL MULTIHANCE GADOBENATE DIMEGLUMINE 529 MG/ML IV SOLN COMPARISON:  04/16/2018 FINDINGS: BRAIN New Lesions: None. Larger lesions: 1. 6 mm ring-enhancing lesion in the periventricular right occipital lobe (series 12, image 61, previously 3 mm). 2. 10 mm heterogeneously enhancing right temporal lobe lesion with increased, mild edema (series 12, image 61, previously 5 mm). 3. 6 mm ring-enhancing lesion in the periventricular left occipital lobe with increased, mild edema (series 12, image 49, previously 3 mm). Stable or Smaller lesions: 1. 5 mm lesion in the left cerebellum, unchanged (series 12, image 24). 2. 2 mm lesion in the medial right occipital lobe, decreased (series 12, image 80, previously 3 mm). 3. 1-2 mm left occipital lesion just lateral to the above described ring-enhancing lesion, minimally smaller (series 12, image 48, previously 2 mm). 4. 8 x 5 mm enhancing left temporal lobe lesion with mild edema, unchanged (series 12, image 69). 5. 14 mm enhancing hemorrhagic posterior left insular lesion, decreased in size with unchanged mild edema (series 12, image 82). 6. Previously enhancing anterior left cerebellar lesion no  longer clearly identified. Other Brain findings: A 4 mm focus of restricted diffusion in the medial right occipital lobe (series 4, image 58) and  adjacent 2-3 mm foci of restricted diffusion in the left frontal white matter at the level of the centrum semiovale (series 4, images 69 and 70) are without enhancement and suggestive of acute infarcts. There is hemosiderin staining in the left sylvian fissure consistent with prior subarachnoid hemorrhage. Background of moderate chronic small vessel ischemic disease in the cerebral white matter and pons is unchanged with chronic lacunar infarcts again noted in the left thalamus and left basal ganglia. There is moderate cerebral atrophy. There is no extra-axial fluid collection or midline shift. Vascular: Major intracranial vascular flow voids are preserved. Skull and upper cervical spine: Unchanged right parietal and right frontal skull lesions. Sinuses/Orbits: Bilateral cataract extraction. Clear paranasal sinuses. Trace right mastoid effusion. Other: None. IMPRESSION: 1. Mild enlargement of treated right occipital, right temporal, and left occipital lesions with up to mild edema. 2. Stable or decreased size of other treated lesions. 3. Subcentimeter foci of restricted diffusion in the left frontal and right occipital lobes without enhancement, most consistent with acute infarcts. Electronically Signed   By: Logan Bores M.D.   On: 07/23/2018 15:10       Assessment/Plan 1. 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 Brain metastases [facility])  Jacqueline Kidd is clinically stable 6 months following SRS.  She has changes on MRI which we still think are suggestive of inflammation from delayed effect of radiation as well as systemic immunotherapy.  3 of the lesions appear inflammed or progressive.  We again will continue to closely monitor these changes with serial imaging.  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    We recommended continuing 400mg  Gabapentin HS only.  She should return to clinic in 3 months following next MRI brain, or sooner as needed.  We appreciate the opportunity to participate in the care of Jacqueline Kidd.   All questions were answered. The patient knows to call the clinic with any problems, questions or concerns. No barriers to learning were detected.  The total time spent in the encounter was 25 minutes and more than 50% was on counseling and review of test results   Ventura Sellers, MD Medical Director of Neuro-Oncology [facility] 07/27/18 10:29 AM

## 2018-07-27 NOTE — Telephone Encounter (Signed)
Scheduled appt per 01/21 los. Patient requested the date and time.  Printed calendar and avs.  (error with provider with the first telephone call)

## 2018-07-27 NOTE — Telephone Encounter (Signed)
Scheduled appt per 01/21 los. Patient requested the date and time.  Printed calendar and avs.

## 2018-07-28 ENCOUNTER — Encounter: Payer: Self-pay | Admitting: Internal Medicine

## 2018-08-10 ENCOUNTER — Ambulatory Visit (HOSPITAL_COMMUNITY)
Admission: RE | Admit: 2018-08-10 | Discharge: 2018-08-10 | Disposition: A | Discharge status: Home / Self Care | Payer: Medicare Other | Source: Ambulatory Visit | Attending: Internal Medicine | Admitting: Internal Medicine

## 2018-08-10 ENCOUNTER — Encounter (HOSPITAL_COMMUNITY): Payer: Self-pay

## 2018-08-10 DIAGNOSIS — C3490 Malignant neoplasm of unspecified part of unspecified bronchus or lung: Secondary | ICD-10-CM | POA: Insufficient documentation

## 2018-08-10 DIAGNOSIS — C3412 Malignant neoplasm of upper lobe, left bronchus or lung: Secondary | ICD-10-CM | POA: Diagnosis not present

## 2018-08-10 MED ORDER — SODIUM CHLORIDE (PF) 0.9 % IJ SOLN
INTRAMUSCULAR | Status: AC
  Filled 2018-08-10: qty 50

## 2018-08-10 MED ORDER — IOHEXOL 300 MG/ML  SOLN
100.0000 mL | Freq: Once | INTRAMUSCULAR | Status: AC | PRN
  Administered 2018-08-10: 100 mL via INTRAVENOUS

## 2018-08-12 ENCOUNTER — Inpatient Hospital Stay: Payer: Medicare Other | Attending: Internal Medicine

## 2018-08-12 ENCOUNTER — Inpatient Hospital Stay (HOSPITAL_BASED_OUTPATIENT_CLINIC_OR_DEPARTMENT_OTHER): Payer: Medicare Other | Admitting: Internal Medicine

## 2018-08-12 ENCOUNTER — Encounter: Payer: Self-pay | Admitting: Internal Medicine

## 2018-08-12 ENCOUNTER — Inpatient Hospital Stay: Payer: Medicare Other

## 2018-08-12 VITALS — HR 115

## 2018-08-12 VITALS — BP 126/78 | HR 105 | Temp 98.4°F | Resp 18 | Ht 61.0 in | Wt 120.5 lb

## 2018-08-12 DIAGNOSIS — C7931 Secondary malignant neoplasm of brain: Secondary | ICD-10-CM | POA: Insufficient documentation

## 2018-08-12 DIAGNOSIS — C3412 Malignant neoplasm of upper lobe, left bronchus or lung: Secondary | ICD-10-CM

## 2018-08-12 DIAGNOSIS — T451X5A Adverse effect of antineoplastic and immunosuppressive drugs, initial encounter: Secondary | ICD-10-CM

## 2018-08-12 DIAGNOSIS — R42 Dizziness and giddiness: Secondary | ICD-10-CM

## 2018-08-12 DIAGNOSIS — G620 Drug-induced polyneuropathy: Secondary | ICD-10-CM

## 2018-08-12 DIAGNOSIS — Z79899 Other long term (current) drug therapy: Secondary | ICD-10-CM | POA: Insufficient documentation

## 2018-08-12 DIAGNOSIS — R599 Enlarged lymph nodes, unspecified: Secondary | ICD-10-CM

## 2018-08-12 DIAGNOSIS — R5382 Chronic fatigue, unspecified: Secondary | ICD-10-CM

## 2018-08-12 DIAGNOSIS — Z5112 Encounter for antineoplastic immunotherapy: Secondary | ICD-10-CM | POA: Diagnosis not present

## 2018-08-12 DIAGNOSIS — R531 Weakness: Secondary | ICD-10-CM | POA: Diagnosis not present

## 2018-08-12 LAB — CBC WITH DIFFERENTIAL (CANCER CENTER ONLY)
Abs Immature Granulocytes: 0.02 10*3/uL (ref 0.00–0.07)
Basophils Absolute: 0 10*3/uL (ref 0.0–0.1)
Basophils Relative: 0 %
Eosinophils Absolute: 0.1 10*3/uL (ref 0.0–0.5)
Eosinophils Relative: 2 %
HCT: 36 % (ref 36.0–46.0)
Hemoglobin: 11.9 g/dL — ABNORMAL LOW (ref 12.0–15.0)
Immature Granulocytes: 0 %
Lymphocytes Relative: 25 %
Lymphs Abs: 1.5 10*3/uL (ref 0.7–4.0)
MCH: 34.2 pg — ABNORMAL HIGH (ref 26.0–34.0)
MCHC: 33.1 g/dL (ref 30.0–36.0)
MCV: 103.4 fL — ABNORMAL HIGH (ref 80.0–100.0)
Monocytes Absolute: 0.7 10*3/uL (ref 0.1–1.0)
Monocytes Relative: 11 %
Neutro Abs: 3.6 10*3/uL (ref 1.7–7.7)
Neutrophils Relative %: 62 %
Platelet Count: 177 10*3/uL (ref 150–400)
RBC: 3.48 MIL/uL — ABNORMAL LOW (ref 3.87–5.11)
RDW: 13.1 % (ref 11.5–15.5)
WBC: 5.8 10*3/uL (ref 4.0–10.5)
nRBC: 0 % (ref 0.0–0.2)

## 2018-08-12 LAB — CMP (CANCER CENTER ONLY)
ALT: 9 U/L (ref 0–44)
ANION GAP: 9 (ref 5–15)
AST: 16 U/L (ref 15–41)
Albumin: 3.2 g/dL — ABNORMAL LOW (ref 3.5–5.0)
Alkaline Phosphatase: 90 U/L (ref 38–126)
BUN: 8 mg/dL (ref 8–23)
CHLORIDE: 104 mmol/L (ref 98–111)
CO2: 26 mmol/L (ref 22–32)
Calcium: 9 mg/dL (ref 8.9–10.3)
Creatinine: 0.71 mg/dL (ref 0.44–1.00)
GFR, Estimated: >60 mL/min (ref >60)
Glucose, Bld: 136 mg/dL — ABNORMAL HIGH (ref 70–99)
Potassium: 3.7 mmol/L (ref 3.5–5.1)
Sodium: 139 mmol/L (ref 135–145)
Total Bilirubin: 1 mg/dL (ref 0.3–1.2)
Total Protein: 7.3 g/dL (ref 6.5–8.1)

## 2018-08-12 LAB — TSH: TSH: 0.859 u[IU]/mL (ref 0.308–3.960)

## 2018-08-12 MED ORDER — SODIUM CHLORIDE 0.9 % IV SOLN
Freq: Once | INTRAVENOUS | Status: AC
  Filled 2018-08-12: qty 250

## 2018-08-12 MED ORDER — SODIUM CHLORIDE 0.9 % IV SOLN
480.0000 mg | Freq: Once | INTRAVENOUS | Status: AC
  Administered 2018-08-12: 480 mg via INTRAVENOUS
  Filled 2018-08-12: qty 48

## 2018-08-12 MED ORDER — SODIUM CHLORIDE 0.9 % IV SOLN
INTRAVENOUS | Status: DC
  Administered 2018-08-12: 11:00:00 via INTRAVENOUS
  Filled 2018-08-12 (×2): qty 250

## 2018-08-12 NOTE — Progress Notes (Signed)
[place] [phone]   Fax:(336) [place] [address]  DIAGNOSIS: stage IV(T2b, N3, M1c) non-small cell lung cancer, squamous cell carcinoma.  The patient presented with large left upper lobe lung mass in addition to left hilar and mediastinal lymphadenopathy and left true vocal cord paralysis. PDL 1 expression was 0%  PRIOR THERAPY:  1) Palliative radiotherapy to the large left upper lobe lung mass as well as metastatic brain lesions. 2)  systemic chemotherapy with carboplatin for AUC of 5 and paclitaxel 175 MG/M2 every 3 weeks. First dose on 04/15/2017.  Status post 6 cycles.  Paclitaxel has been reduced to 150 mg/M2 starting from cycle #4 secondary to peripheral neuropathy.  Last dose of chemotherapy was given July 29, 2017.  CURRENT THERAPY: Second line treatment with immunotherapy with Nivolumab 480 mg IV every 4 weeks.  First dose of February 25, 2018.  Status post 6 cycles.  INTERVAL HISTORY: Jacqueline Kidd 78 y.o. female returns to the clinic today for follow-up visit accompanied by her husband.  The patient is feeling fine today with no concerning complaints except for generalized weakness and occasional dizzy spells.  She had MRI of the brain performed recently that showed no clear evidence for disease progression.  The patient denied having any chest pain, shortness of breath, cough or hemoptysis.  She denied having any fever or chills.  She has no nausea, vomiting, diarrhea or constipation.  She has been tolerating her treatment with nivolumab fairly well.  She had repeat CT scan of the chest, abdomen and pelvis performed recently and she is here for evaluation and discussion of her scan results.  MEDICAL HISTORY: Past Medical History:  Diagnosis Date  . Chronic low back pain   . COPD (chronic obstructive pulmonary disease) ([place])   . GERD (gastroesophageal reflux disease)   .  Hyperlipidemia   . Hypertension   . Lesion of left lung   . lung ca dx'd 01/2017  . Osteoporosis     ALLERGIES:  is allergic to fosamax [alendronate]; hydrocodone-acetaminophen; and lisinopril-hydrochlorothiazide.  MEDICATIONS:  Current Outpatient Medications  Medication Sig Dispense Refill  . acetaminophen (TYLENOL) 325 MG tablet Take 650 mg by mouth every 6 (six) hours as needed for mild pain or moderate pain.     . Albuterol Sulfate 108 (90 Base) MCG/ACT AEPB Inhale 2 puffs into the lungs every 6 (six) hours as needed. 1 each 0  . DULoxetine (CYMBALTA) 60 MG capsule Take 1 capsule (60 mg total) by mouth daily. 30 capsule 3  . gabapentin (NEURONTIN) 400 MG capsule Take 2 capsules (800 mg total) by mouth at bedtime. 270 capsule 3  . LORazepam (ATIVAN) 0.5 MG tablet Take 1 tablet (0.5 mg total) by mouth as needed for anxiety (take one tablet 30 minutes prior to MRI scans and radiation treatment). 10 tablet 0  . prochlorperazine (COMPAZINE) 10 MG tablet Take 10 mg by mouth every 6 (six) hours as needed for nausea or vomiting.     No current facility-administered medications for this visit.     SURGICAL HISTORY:  Past Surgical History:  Procedure Laterality Date  . ABDOMINAL HYSTERECTOMY    . TOTAL HIP ARTHROPLASTY     right    REVIEW OF SYSTEMS:  Constitutional: positive for fatigue Eyes: negative Ears, nose, mouth, throat, and face: negative Respiratory: negative Cardiovascular: negative Gastrointestinal: negative Genitourinary:negative Integument/breast: negative Hematologic/lymphatic: negative Musculoskeletal:positive  for muscle weakness Neurological: negative Behavioral/Psych: negative Endocrine: negative Allergic/Immunologic: negative   PHYSICAL EXAMINATION: General appearance: alert, cooperative, fatigued and no distress Head: Normocephalic, without obvious abnormality, atraumatic Neck: no adenopathy, no JVD, supple, symmetrical, trachea midline and thyroid not  enlarged, symmetric, no tenderness/mass/nodules Lymph nodes: Cervical, supraclavicular, and axillary nodes normal. Resp: clear to auscultation bilaterally Back: symmetric, no curvature. ROM normal. No CVA tenderness. Cardio: regular rate and rhythm, S1, S2 normal, no murmur, click, rub or gallop GI: soft, non-tender; bowel sounds normal; no masses,  no organomegaly Extremities: extremities normal, atraumatic, no cyanosis or edema Neurologic: Alert and oriented X 3, normal strength and tone. Normal symmetric reflexes. Normal coordination and gait  ECOG PERFORMANCE STATUS: 1 - Symptomatic but completely ambulatory  Blood pressure 126/78, pulse (!) 105, temperature 98.4 F (36.9 C), temperature source Oral, resp. rate 18, height 5\' 1"  (1.549 m), weight 120 lb 8 oz (54.7 kg), SpO2 94 %.  LABORATORY DATA: Lab Results  Component Value Date   WBC 5.8 08/12/2018   HGB 11.9 (L) 08/12/2018   HCT 36.0 08/12/2018   MCV 103.4 (H) 08/12/2018   PLT 177 08/12/2018      Chemistry      Component Value Date/Time   NA 139 08/12/2018 0926   NA 138 07/08/2017 0831   K 3.7 08/12/2018 0926   K 3.3 (L) 07/08/2017 0831   CL 104 08/12/2018 0926   CO2 26 08/12/2018 0926   CO2 24 07/08/2017 0831   BUN 8 08/12/2018 0926   BUN 4.5 (L) 07/08/2017 0831   CREATININE 0.71 08/12/2018 0926   CREATININE 0.6 07/08/2017 0831      Component Value Date/Time   CALCIUM 9.0 08/12/2018 0926   CALCIUM 9.0 07/08/2017 0831   ALKPHOS 90 08/12/2018 0926   ALKPHOS 119 07/08/2017 0831   AST 16 08/12/2018 0926   AST 25 07/08/2017 0831   ALT 9 08/12/2018 0926   ALT 16 07/08/2017 0831   BILITOT 1.0 08/12/2018 0926   BILITOT 0.52 07/08/2017 0831       RADIOGRAPHIC STUDIES: Ct Chest W Contrast  Result Date: 08/10/2018 CLINICAL DATA:  Stage IV left upper lobe non-small-cell lung cancer EXAM: CT CHEST, ABDOMEN, AND PELVIS WITH CONTRAST TECHNIQUE: Multidetector CT imaging of the chest, abdomen and pelvis was performed  following the standard protocol during bolus administration of intravenous contrast. CONTRAST:  145mL OMNIPAQUE IOHEXOL 300 MG/ML  SOLN COMPARISON:  05/17/2018 FINDINGS: CT CHEST FINDINGS Cardiovascular: Heart size normal. Small pericardial effusion is stable. Coronary artery calcification is evident. Atherosclerotic calcification is noted in the wall of the thoracic aorta. Aberrant origin right subclavian artery noted. Mediastinum/Nodes: No mediastinal lymphadenopathy. No right hilar lymphadenopathy. Abnormal soft tissue attenuation in the left hilum blends into the left upper lobe collapse/consolidation is likely treatment related. The esophagus has normal imaging features. There is no axillary lymphadenopathy. Lungs/Pleura: Interval progression of left upper lobe collapse medially against the mediastinum. Within the collapse lung is a 2.1 x 1.8 cm necrotic lesion which may represent the primary malignancy. Subpleural reticulation in the lungs bilaterally suggests a component of underlying fibrotic lung disease. Small to moderate left pleural effusion is progressive in the interval in includes a loculated component in the left apex. Musculoskeletal: No worrisome lytic or sclerotic osseous abnormality. CT ABDOMEN PELVIS FINDINGS Hepatobiliary: No suspicious focal abnormality within the liver parenchyma. Tiny calcified gallstones evident. No intrahepatic or extrahepatic biliary dilation. Pancreas: No focal mass lesion. No dilatation of the main duct. No intraparenchymal cyst. No  peripancreatic edema. Spleen: No splenomegaly. No focal mass lesion. Adrenals/Urinary Tract: No adrenal nodule or mass. Kidneys unremarkable. Proximal ureters unremarkable. Distal ureters obscured by beam hardening artifact from right hip replacement. Bladder largely obscured by artifact. Stomach/Bowel: Stomach is unremarkable. No gastric wall thickening. No evidence of outlet obstruction. Duodenum is normally positioned as is the ligament  of Treitz. No small bowel wall thickening. No small bowel dilatation. The terminal ileum is normal. The appendix is not visualized, but there is no edema or inflammation in the region of the cecum. No gross colonic mass. No colonic wall thickening. Vascular/Lymphatic: There is abdominal aortic atherosclerosis without aneurysm. There is no gastrohepatic or hepatoduodenal ligament lymphadenopathy. No intraperitoneal or retroperitoneal lymphadenopathy. No pelvic sidewall lymphadenopathy. Reproductive: Uterus surgically absent.  There is no adnexal mass. Other: No intraperitoneal free fluid. Musculoskeletal: Status post right total hip replacement. No worrisome lytic or sclerotic osseous abnormality. Compression deformity at L2, L3, L4, and L5 is stable. IMPRESSION: 1. Interval progression of left upper lobe collapse/consolidation with 2.1 x 1.8 cm necrotic lesion in the collapsed lung, likely corresponding to the 3.3 x 1.7 cm left upper lobe mass noted previously. 2. Interval continued progression of left pleural effusion. 3. Right adrenal nodule seen on the previous study has decreased in the interval. No other evidence for metastatic disease in the abdomen or pelvis. 4. Cholelithiasis. 5.  Aortic Atherosclerois (ICD10-170.0) Electronically Signed   By: Misty Stanley M.D.   On: 08/10/2018 14:54   Mr [name] WF Contrast  Result Date: 07/23/2018 CLINICAL DATA:  Restaging of metastatic lung cancer. Multiple brain metastases treated with SRS in 03/2017, 10/2017, and 02/2018. EXAM: MRI HEAD WITHOUT AND WITH CONTRAST TECHNIQUE: Multiplanar, multiecho pulse sequences of the brain and surrounding structures were obtained without and with intravenous contrast. CONTRAST:  29mL MULTIHANCE GADOBENATE DIMEGLUMINE 529 MG/ML IV SOLN COMPARISON:  04/16/2018 FINDINGS: BRAIN New Lesions: None. Larger lesions: 1. 6 mm ring-enhancing lesion in the periventricular right occipital lobe (series 12, image 61, previously 3 mm). 2. 10 mm  heterogeneously enhancing right temporal lobe lesion with increased, mild edema (series 12, image 61, previously 5 mm). 3. 6 mm ring-enhancing lesion in the periventricular left occipital lobe with increased, mild edema (series 12, image 49, previously 3 mm). Stable or Smaller lesions: 1. 5 mm lesion in the left cerebellum, unchanged (series 12, image 24). 2. 2 mm lesion in the medial right occipital lobe, decreased (series 12, image 80, previously 3 mm). 3. 1-2 mm left occipital lesion just lateral to the above described ring-enhancing lesion, minimally smaller (series 12, image 48, previously 2 mm). 4. 8 x 5 mm enhancing left temporal lobe lesion with mild edema, unchanged (series 12, image 69). 5. 14 mm enhancing hemorrhagic posterior left insular lesion, decreased in size with unchanged mild edema (series 12, image 82). 6. Previously enhancing anterior left cerebellar lesion no longer clearly identified. Other Brain findings: A 4 mm focus of restricted diffusion in the medial right occipital lobe (series 4, image 58) and adjacent 2-3 mm foci of restricted diffusion in the left frontal white matter at the level of the centrum semiovale (series 4, images 69 and 70) are without enhancement and suggestive of acute infarcts. There is hemosiderin staining in the left sylvian fissure consistent with prior subarachnoid hemorrhage. Background of moderate chronic small vessel ischemic disease in the cerebral white matter and pons is unchanged with chronic lacunar infarcts again noted in the left thalamus and left basal ganglia. There is moderate cerebral  atrophy. There is no extra-axial fluid collection or midline shift. Vascular: Major intracranial vascular flow voids are preserved. Skull and upper cervical spine: Unchanged right parietal and right frontal skull lesions. Sinuses/Orbits: Bilateral cataract extraction. Clear paranasal sinuses. Trace right mastoid effusion. Other: None. IMPRESSION: 1. Mild enlargement of  treated right occipital, right temporal, and left occipital lesions with up to mild edema. 2. Stable or decreased size of other treated lesions. 3. Subcentimeter foci of restricted diffusion in the left frontal and right occipital lobes without enhancement, most consistent with acute infarcts. Electronically Signed   By: Logan Bores M.D.   On: 07/23/2018 15:10   Ct Abdomen Pelvis W Contrast  Result Date: 08/10/2018 CLINICAL DATA:  Stage IV left upper lobe non-small-cell lung cancer EXAM: CT CHEST, ABDOMEN, AND PELVIS WITH CONTRAST TECHNIQUE: Multidetector CT imaging of the chest, abdomen and pelvis was performed following the standard protocol during bolus administration of intravenous contrast. CONTRAST:  144mL OMNIPAQUE IOHEXOL 300 MG/ML  SOLN COMPARISON:  05/17/2018 FINDINGS: CT CHEST FINDINGS Cardiovascular: Heart size normal. Small pericardial effusion is stable. Coronary artery calcification is evident. Atherosclerotic calcification is noted in the wall of the thoracic aorta. Aberrant origin right subclavian artery noted. Mediastinum/Nodes: No mediastinal lymphadenopathy. No right hilar lymphadenopathy. Abnormal soft tissue attenuation in the left hilum blends into the left upper lobe collapse/consolidation is likely treatment related. The esophagus has normal imaging features. There is no axillary lymphadenopathy. Lungs/Pleura: Interval progression of left upper lobe collapse medially against the mediastinum. Within the collapse lung is a 2.1 x 1.8 cm necrotic lesion which may represent the primary malignancy. Subpleural reticulation in the lungs bilaterally suggests a component of underlying fibrotic lung disease. Small to moderate left pleural effusion is progressive in the interval in includes a loculated component in the left apex. Musculoskeletal: No worrisome lytic or sclerotic osseous abnormality. CT ABDOMEN PELVIS FINDINGS Hepatobiliary: No suspicious focal abnormality within the liver parenchyma.  Tiny calcified gallstones evident. No intrahepatic or extrahepatic biliary dilation. Pancreas: No focal mass lesion. No dilatation of the main duct. No intraparenchymal cyst. No peripancreatic edema. Spleen: No splenomegaly. No focal mass lesion. Adrenals/Urinary Tract: No adrenal nodule or mass. Kidneys unremarkable. Proximal ureters unremarkable. Distal ureters obscured by beam hardening artifact from right hip replacement. Bladder largely obscured by artifact. Stomach/Bowel: Stomach is unremarkable. No gastric wall thickening. No evidence of outlet obstruction. Duodenum is normally positioned as is the ligament of Treitz. No small bowel wall thickening. No small bowel dilatation. The terminal ileum is normal. The appendix is not visualized, but there is no edema or inflammation in the region of the cecum. No gross colonic mass. No colonic wall thickening. Vascular/Lymphatic: There is abdominal aortic atherosclerosis without aneurysm. There is no gastrohepatic or hepatoduodenal ligament lymphadenopathy. No intraperitoneal or retroperitoneal lymphadenopathy. No pelvic sidewall lymphadenopathy. Reproductive: Uterus surgically absent.  There is no adnexal mass. Other: No intraperitoneal free fluid. Musculoskeletal: Status post right total hip replacement. No worrisome lytic or sclerotic osseous abnormality. Compression deformity at L2, L3, L4, and L5 is stable. IMPRESSION: 1. Interval progression of left upper lobe collapse/consolidation with 2.1 x 1.8 cm necrotic lesion in the collapsed lung, likely corresponding to the 3.3 x 1.7 cm left upper lobe mass noted previously. 2. Interval continued progression of left pleural effusion. 3. Right adrenal nodule seen on the previous study has decreased in the interval. No other evidence for metastatic disease in the abdomen or pelvis. 4. Cholelithiasis. 5.  Aortic Atherosclerois (ICD10-170.0) Electronically Signed   By: Randall Hiss  Tery Sanfilippo M.D.   On: 08/10/2018 14:54     ASSESSMENT AND PLAN: This is a very pleasant 78 years old white female with stage IV (T2b, N3, M1 C) non-small cell lung cancer, squamous cell carcinoma presented with large left upper lobe lung mass in addition to mediastinal and supraclavicular lymphadenopathy as well as metastatic brain lesions diagnosed in September 2018. The patient underwent a course of palliative radiotherapy to the left upper lobe lung mass as well as the metastatic brain lesions. She is currently undergoing palliative systemic chemotherapy with carboplatin for AUC of 5 and paclitaxel 175 mg/M2 every 3 weeks, status post 6 cycles. She has been in observation for several months. Repeat CT scan of the chest, abdomen and pelvis showed evidence for disease progression with enlargement of right adrenal nodule in addition to progressive enlargement of small left pleural effusion in addition to the right parietal scalp lesion.  The patient also was found to have evidence for progressive disease in the brain. She was started on second line treatment with immunotherapy was Nivolumab 480 mg IV every 4 weeks status post 6 cycles. She has been tolerating this treatment well with no concerning adverse effects. Repeat imaging studies including CT scan of the chest, abdomen and pelvis showed further increase in the size of the left upper lobe lung mass as well as increase in the left pleural effusion.  No other concerning findings for disease progression in the abdomen or pelvis and the right adrenal gland nodule is smaller. I had a lengthy discussion with the patient and her husband about her current condition and treatment options. I recommended for the patient to see Dr. [name] for reevaluation and consideration of short course of palliative radiotherapy to the enlarging left upper lobe lung mass and we would continue with her current treatment with nivolumab for now. If the patient is not a candidate for any further palliative  radiotherapy, I would consider her for discussion of another systemic chemotherapy with gemcitabine or palliative care and hospice referral. The patient will come back for follow-up visit in 4 weeks for evaluation before starting cycle #8. She was advised to call immediately if she has any concerning symptoms in the interval. The patient voices understanding of current disease status and treatment options and is in agreement with the current care plan. All questions were answered. The patient knows to call the clinic with any problems, questions or concerns. We can certainly see the patient much sooner if necessary.  Disclaimer: This note was dictated with voice recognition software. Similar sounding words can inadvertently be transcribed and may not be corrected upon review.

## 2018-08-12 NOTE — Patient Instructions (Signed)
[facility] Discharge Instructions for Patients Receiving Chemotherapy  Today you received the following chemotherapy agents: Nivolumab  To help prevent nausea and vomiting after your treatment, we encourage you to take your nausea medication as directed.    If you develop nausea and vomiting that is not controlled by your nausea medication, call the clinic.   BELOW ARE SYMPTOMS THAT SHOULD BE REPORTED IMMEDIATELY:  *FEVER GREATER THAN 100.5 F  *CHILLS WITH OR WITHOUT FEVER  NAUSEA AND VOMITING THAT IS NOT CONTROLLED WITH YOUR NAUSEA MEDICATION  *UNUSUAL SHORTNESS OF BREATH  *UNUSUAL BRUISING OR BLEEDING  TENDERNESS IN MOUTH AND THROAT WITH OR WITHOUT PRESENCE OF ULCERS  *URINARY PROBLEMS  *BOWEL PROBLEMS  UNUSUAL RASH Items with * indicate a potential emergency and should be followed up as soon as possible.  Feel free to call the clinic should you have any questions or concerns. The clinic phone number is [phone].  Please show the CHEMO ALERT CARD at check-in to the Emergency Department and triage nurse.   

## 2018-08-12 NOTE — Progress Notes (Signed)
Per Dr. Julien Nordmann, ok to treat with elevated HR. Will give 500 mL NS bolus.

## 2018-08-14 ENCOUNTER — Encounter: Payer: Self-pay | Admitting: Internal Medicine

## 2018-09-07 ENCOUNTER — Ambulatory Visit
Admission: RE | Admit: 2018-09-07 | Discharge: 2018-09-07 | Disposition: A | Discharge status: Home / Self Care | Payer: Medicare Other | Source: Ambulatory Visit | Attending: Radiation Oncology | Admitting: Radiation Oncology

## 2018-09-07 ENCOUNTER — Encounter: Payer: Self-pay | Admitting: Urology

## 2018-09-07 ENCOUNTER — Ambulatory Visit
Admission: RE | Admit: 2018-09-07 | Discharge: 2018-09-07 | Disposition: A | Discharge status: Home / Self Care | Payer: Medicare Other | Source: Ambulatory Visit | Attending: Urology | Admitting: Urology

## 2018-09-07 ENCOUNTER — Other Ambulatory Visit: Payer: Self-pay

## 2018-09-07 VITALS — BP 127/79 | HR 110 | Temp 97.6°F | Resp 20 | Ht 61.0 in | Wt 119.1 lb

## 2018-09-07 DIAGNOSIS — C771 Secondary and unspecified malignant neoplasm of intrathoracic lymph nodes: Secondary | ICD-10-CM | POA: Diagnosis not present

## 2018-09-07 DIAGNOSIS — C773 Secondary and unspecified malignant neoplasm of axilla and upper limb lymph nodes: Secondary | ICD-10-CM | POA: Diagnosis not present

## 2018-09-07 DIAGNOSIS — R918 Other nonspecific abnormal finding of lung field: Secondary | ICD-10-CM

## 2018-09-07 DIAGNOSIS — C7931 Secondary malignant neoplasm of brain: Secondary | ICD-10-CM | POA: Diagnosis not present

## 2018-09-07 DIAGNOSIS — C3412 Malignant neoplasm of upper lobe, left bronchus or lung: Secondary | ICD-10-CM | POA: Diagnosis not present

## 2018-09-07 DIAGNOSIS — Z923 Personal history of irradiation: Secondary | ICD-10-CM | POA: Insufficient documentation

## 2018-09-07 DIAGNOSIS — Z79899 Other long term (current) drug therapy: Secondary | ICD-10-CM | POA: Insufficient documentation

## 2018-09-07 NOTE — Progress Notes (Signed)
Radiation Oncology         [phone] ________________________________  Name: Jacqueline Kidd MRN: [id number]  Date: 09/07/2018  DOB: 10-14-1940  Re-Consultation Note  CC: [name], MD  Curt Bears, MD  Diagnosis:   78 y.o. female with stage IV (T2b, N3, M1 C), NSCLC, squamous cell carcinoma of the left upper lung with metastatic mediastinal lymphadenopathy and brain metastases at diagnosis, now with evidence of disease progression in the left upper lobe lung mass despite current immunotherapy.  Interval Since Last Radiation:  6 months 1.   02/05/2018: Brain PTV6-8// 20 Gy in 1 fraction   1. Brain PTV6: Anterior Left Cerebellum 33mm // 20 Gy in 1 fraction, Max dose=119.4%  2. Brain PTV7: Left Occipital 61mm// 20 Gy in 1 fraction, max dose=120.9%  3. Brain PTV8: Left Occipital 20mm // 20 Gy in 1 fraction, max dose=120.9%  2.  10/22/2017:  Brain PTV3-5 // 20 Gy in 1 fraction  1. PTV3: Right Temporal 63mm  2. PTV4: Left Temporal 73mm  3. PTV5: Right Occipital 31mm  3.  Chest radiation 03/23/2017 - 05/06/2017- The primary tumor and involved mediastinal adenopathy were treated to 66 Gy in 33 fractions of 2 Gy.  4.  SRS on 04/03/17: Brain PTV1-2 // 20 Gy in 1 fraction.  1.  PTV1: Left cerebellar 12 mm target  2.  PTV2: Left insular 7 mm target    Narrative:  The patient returns today for re-consultation regarding her disease progression with enlargement of the previously treated left upper lobe lung mass. She has continued to receive second line treatment immunotherapy with Opdivo, under the care and direction of Dr. Julien Nordmann, along with close follow up of her treated metastatic brain disease with Dr. Mickeal Skinner. She recently underwent restaging brain MRI on 07/23/2018 which showed changes which are felt most likely suggestive of inflammation from radiation treatment effect as well as systemic immunotherapy without evidence of new disease.  The plan is to continue to monitor closely with  repeat imaging in April 2020.  Unfortunately, her recent restaging CT C/A/P on 08/10/2018 revealed further increase in the size of the left upper lobe lung mass as well as increase in the left pleural effusion.  No other concerning findings for disease progression in the abdomen or pelvis.  In summary, she initially presented to her PCP, Dr. Evette Doffing, back in June 2018 with complaints of hoarseness of her voice.  On evaluation with CT of the neck, she was found to have a 5.5 x 5.2 x 4.7 cm left upper lobe lung mass with associated left hilar and mediastinal lymphadenopathy likely impacting the left recurrent laryngeal nerve. There was also laterally displaced left true vocal cord and irregularity of the left false cord, likely related to vocal cord paralysis associated with the mediastinal tumor.  She then had CT scan of the chest on 02/23/2017 which showed a large left upper lobe mass extending to the pleural surface of the mediastinum at the level of the AP window, suspicious for mediastinal invasion. There was irregular metastatic left lower paratracheal and AP window adenopathy as well as subcarinal adenopathy.  PET scan was performed on Mar 17, 202018, confirming stage IIIB(T2b, N3, M0)  lung cancer.  MRI of the brain was performed 03/05/18 revealing two subcentimeter intracranial metastases within the LEFT temporal lobe and LEFT cerebellum.  She had a CT-guided core biopsy of the left upper lobe lung mass on 03/18/17 and the final pathology ([id number]) was consistent with squamous cell carcinoma.  She  elected to proceed with palliative radiation treatments to the chest, concurrent with chemotherapy with carboplatin and paclitaxel every 3 weeks- first dose on 04/15/2017.  Her chest radiation was completed between 03/23/2017 - 05/06/2017.  Additionally, she completed SRS treatment for the two brain metastases on 04/03/2017.  She tolerated her treatments well.  Repeat CT C/A/P performed 06/19/18 showed reduction in  the left upper lobe lung mass as well as the mediastinal lymphadenopathy with no new or progressive disease in the chest, abdomen or pelvis. Follow up MRI brain on 07/10/17 showed an excellent response to treatment with decreased size of both treated lesions and no evidence of new disease.   She completed 6 cycles of palliative chemotherapy on 07/29/17 and went on observation.  A repeat CT Chest on 08/12/17 for disease restaging showed no concerning findings for disease progression.  Unfortunately, a follow-up brain MRI on 10/08/2017 revealed 3 new subcentimeter metastases with a 4 mm lesion in the RIGHT inferior temporal lobe, a 2 mm lesion in the right occipital lobe and a 4 mm lesion in the left temporal lobe without significant mass-effect or surrounding edema.  There was also noted interval growth of the previously treated lesion in the left insula but felt related to treatment effect.  She elected to proceed with stereotactic radiosurgery to the 3 new lesions in the brain and this was completed on 10/22/17 and tolerated well.  She had remained on observation only but her follow up restaging imaging with CT C/A/P on 02/15/2018 showed disease progression with enlargement of the right adrenal nodule in addition to progressive enlargement of a small left pleural effusion and a right parietal scalp lesion.  On follow up MRI brain on 01/21/18 she was noted to have three new subcentimeter brain metastases (3 mm anterior left cerebellar lesion, 4 mm left occipital lobe lesion and an adjacent 3 mm left occipital lobe lesion) with only mild edema at most.  There was increased size of a treated left insular metastasis with increased edema but all other previously treated lesions appeared stable to smaller. Additionally noted were new right frontal and right parietal skull metastases.  She elected to proceed with SRS to the 3 new lesions which was completed on 02/05/18 and tolerated well.  She was started on second line treatment  immunotherapy with Opdivo on 02/25/18 for management of her systemic disease and feels that she has continued tolerating this treatment well.                  REVIEW OF SYSTEMS:  On review of systems, the patient states that she has been doing fairly well overall. She denies any chest pain, productive cough, fevers, chills, night sweats, or unintended weight changes.  She has noted intermittent scant amounts of hemoptysis that vary from a very faint, light pink coloration to a darker, rust color.  She denies any bowel or bladder disturbances, and denies abdominal pain, nausea or vomiting. She denies any new skin lesions or concerns. 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 She has continued taking neurontin under the care and direction of Dr. Mickeal Skinner with some improvement in sxs. She denies headaches, visual or auditory changes, tinnitus, dizziness, tremors, focal weakness, seizure activity or imbalance.A complete review of systems is obtained and is otherwise negative.  ALLERGIES:  is allergic to fosamax [alendronate]; hydrocodone-acetaminophen; and lisinopril-hydrochlorothiazide.  Meds: Current Outpatient Medications  Medication Sig Dispense Refill  .  acetaminophen (TYLENOL) 325 MG tablet Take 650 mg by mouth every 6 (six) hours as needed for mild pain or moderate pain.     . Albuterol Sulfate 108 (90 Base) MCG/ACT AEPB Inhale 2 puffs into the lungs every 6 (six) hours as needed. 1 each 0  . cholecalciferol (VITAMIN D3) 25 MCG (1000 UT) tablet Take 1,000 Units by mouth daily.    . Omega-3 Fatty Acids (FISH OIL OMEGA-3 PO) Take by mouth.    . prochlorperazine (COMPAZINE) 10 MG tablet Take 10 mg by mouth every 6 (six) hours as needed for nausea or vomiting.    . DULoxetine (CYMBALTA) 60 MG capsule Take 1 capsule (60 mg total) by mouth daily. (Patient not taking: Reported on 09/07/2018) 30 capsule 3  .  gabapentin (NEURONTIN) 400 MG capsule Take 2 capsules (800 mg total) by mouth at bedtime. (Patient not taking: Reported on 09/07/2018) 270 capsule 3  . LORazepam (ATIVAN) 0.5 MG tablet Take 1 tablet (0.5 mg total) by mouth as needed for anxiety (take one tablet 30 minutes prior to MRI scans and radiation treatment). (Patient not taking: Reported on 09/07/2018) 10 tablet 0   No current facility-administered medications for this encounter.     Physical Findings:  height is 5\' 1"  (1.549 m) and weight is 119 lb 2 oz (54 kg). Her oral temperature is 97.6 F (36.4 C). Her blood pressure is 127/79 and her pulse is 110 (abnormal). Her respiration is 20 and oxygen saturation is 95%.  Pain Assessment Pain Score: 8  Pain Loc: Hand/10 In general this is a well appearing caucasian female in no acute distress. She's alert and oriented x4 and appropriate throughout the examination. Cardiopulmonary assessment is negative for acute distress and she exhibits normal effort.  She has diffuse wheezing throughout the lung fields bilaterally on exam.  Cardio exam with RRR, no murmurs, rubs or gallops. She appears grossly neurologically intact. EOMs are intact bilaterally and PERRLA. There are no palpable lymph nodes in the cervical or supraclavicular chains.  Strength is 5 out of 5 and equal bilaterally in the upper and lower extremities.  Sensation is intact to light touch in the upper and lower extremities though dulled distally.  Lab Findings: Lab Results  Component Value Date   WBC 5.8 08/12/2018   HGB 11.9 (L) 08/12/2018   HCT 36.0 08/12/2018   MCV 103.4 (H) 08/12/2018   PLT 177 08/12/2018     Radiographic Findings: Ct Chest W Contrast  Result Date: 08/10/2018 CLINICAL DATA:  Stage IV left upper lobe non-small-cell lung cancer EXAM: CT CHEST, ABDOMEN, AND PELVIS WITH CONTRAST TECHNIQUE: Multidetector CT imaging of the chest, abdomen and pelvis was performed following the standard protocol during bolus  administration of intravenous contrast. CONTRAST:  131mL OMNIPAQUE IOHEXOL 300 MG/ML  SOLN COMPARISON:  05/17/2018 FINDINGS: CT CHEST FINDINGS Cardiovascular: Heart size normal. Small pericardial effusion is stable. Coronary artery calcification is evident. Atherosclerotic calcification is noted in the wall of the thoracic aorta. Aberrant origin right subclavian artery noted. Mediastinum/Nodes: No mediastinal lymphadenopathy. No right hilar lymphadenopathy. Abnormal soft tissue attenuation in the left hilum blends into the left upper lobe collapse/consolidation is likely treatment related. The esophagus has normal imaging features. There is no axillary lymphadenopathy. Lungs/Pleura: Interval progression of left upper lobe collapse medially against the mediastinum. Within the collapse lung is a 2.1 x 1.8 cm necrotic lesion which may represent the primary malignancy. Subpleural reticulation in the lungs bilaterally suggests a component of underlying fibrotic lung disease. Small  to moderate left pleural effusion is progressive in the interval in includes a loculated component in the left apex. Musculoskeletal: No worrisome lytic or sclerotic osseous abnormality. CT ABDOMEN PELVIS FINDINGS Hepatobiliary: No suspicious focal abnormality within the liver parenchyma. Tiny calcified gallstones evident. No intrahepatic or extrahepatic biliary dilation. Pancreas: No focal mass lesion. No dilatation of the main duct. No intraparenchymal cyst. No peripancreatic edema. Spleen: No splenomegaly. No focal mass lesion. Adrenals/Urinary Tract: No adrenal nodule or mass. Kidneys unremarkable. Proximal ureters unremarkable. Distal ureters obscured by beam hardening artifact from right hip replacement. Bladder largely obscured by artifact. Stomach/Bowel: Stomach is unremarkable. No gastric wall thickening. No evidence of outlet obstruction. Duodenum is normally positioned as is the ligament of Treitz. No small bowel wall thickening. No  small bowel dilatation. The terminal ileum is normal. The appendix is not visualized, but there is no edema or inflammation in the region of the cecum. No gross colonic mass. No colonic wall thickening. Vascular/Lymphatic: There is abdominal aortic atherosclerosis without aneurysm. There is no gastrohepatic or hepatoduodenal ligament lymphadenopathy. No intraperitoneal or retroperitoneal lymphadenopathy. No pelvic sidewall lymphadenopathy. Reproductive: Uterus surgically absent.  There is no adnexal mass. Other: No intraperitoneal free fluid. Musculoskeletal: Status post right total hip replacement. No worrisome lytic or sclerotic osseous abnormality. Compression deformity at L2, L3, L4, and L5 is stable. IMPRESSION: 1. Interval progression of left upper lobe collapse/consolidation with 2.1 x 1.8 cm necrotic lesion in the collapsed lung, likely corresponding to the 3.3 x 1.7 cm left upper lobe mass noted previously. 2. Interval continued progression of left pleural effusion. 3. Right adrenal nodule seen on the previous study has decreased in the interval. No other evidence for metastatic disease in the abdomen or pelvis. 4. Cholelithiasis. 5.  Aortic Atherosclerois (ICD10-170.0) Electronically Signed   By: Misty Stanley M.D.   On: 08/10/2018 14:54   Ct Abdomen Pelvis W Contrast  Result Date: 08/10/2018 CLINICAL DATA:  Stage IV left upper lobe non-small-cell lung cancer EXAM: CT CHEST, ABDOMEN, AND PELVIS WITH CONTRAST TECHNIQUE: Multidetector CT imaging of the chest, abdomen and pelvis was performed following the standard protocol during bolus administration of intravenous contrast. CONTRAST:  164mL OMNIPAQUE IOHEXOL 300 MG/ML  SOLN COMPARISON:  05/17/2018 FINDINGS: CT CHEST FINDINGS Cardiovascular: Heart size normal. Small pericardial effusion is stable. Coronary artery calcification is evident. Atherosclerotic calcification is noted in the wall of the thoracic aorta. Aberrant origin right subclavian artery  noted. Mediastinum/Nodes: No mediastinal lymphadenopathy. No right hilar lymphadenopathy. Abnormal soft tissue attenuation in the left hilum blends into the left upper lobe collapse/consolidation is likely treatment related. The esophagus has normal imaging features. There is no axillary lymphadenopathy. Lungs/Pleura: Interval progression of left upper lobe collapse medially against the mediastinum. Within the collapse lung is a 2.1 x 1.8 cm necrotic lesion which may represent the primary malignancy. Subpleural reticulation in the lungs bilaterally suggests a component of underlying fibrotic lung disease. Small to moderate left pleural effusion is progressive in the interval in includes a loculated component in the left apex. Musculoskeletal: No worrisome lytic or sclerotic osseous abnormality. CT ABDOMEN PELVIS FINDINGS Hepatobiliary: No suspicious focal abnormality within the liver parenchyma. Tiny calcified gallstones evident. No intrahepatic or extrahepatic biliary dilation. Pancreas: No focal mass lesion. No dilatation of the main duct. No intraparenchymal cyst. No peripancreatic edema. Spleen: No splenomegaly. No focal mass lesion. Adrenals/Urinary Tract: No adrenal nodule or mass. Kidneys unremarkable. Proximal ureters unremarkable. Distal ureters obscured by beam hardening artifact from right hip replacement. Bladder largely  obscured by artifact. Stomach/Bowel: Stomach is unremarkable. No gastric wall thickening. No evidence of outlet obstruction. Duodenum is normally positioned as is the ligament of Treitz. No small bowel wall thickening. No small bowel dilatation. The terminal ileum is normal. The appendix is not visualized, but there is no edema or inflammation in the region of the cecum. No gross colonic mass. No colonic wall thickening. Vascular/Lymphatic: There is abdominal aortic atherosclerosis without aneurysm. There is no gastrohepatic or hepatoduodenal ligament lymphadenopathy. No intraperitoneal  or retroperitoneal lymphadenopathy. No pelvic sidewall lymphadenopathy. Reproductive: Uterus surgically absent.  There is no adnexal mass. Other: No intraperitoneal free fluid. Musculoskeletal: Status post right total hip replacement. No worrisome lytic or sclerotic osseous abnormality. Compression deformity at L2, L3, L4, and L5 is stable. IMPRESSION: 1. Interval progression of left upper lobe collapse/consolidation with 2.1 x 1.8 cm necrotic lesion in the collapsed lung, likely corresponding to the 3.3 x 1.7 cm left upper lobe mass noted previously. 2. Interval continued progression of left pleural effusion. 3. Right adrenal nodule seen on the previous study has decreased in the interval. No other evidence for metastatic disease in the abdomen or pelvis. 4. Cholelithiasis. 5.  Aortic Atherosclerois (ICD10-170.0) Electronically Signed   By: Misty Stanley M.D.   On: 08/10/2018 14:54    Impression/Plan: 1. 78 y.o. female with stage IV (T2b, N3, M1 C) NSCLC, squamous cell carcinoma of the left upper lung with neck and mediastinal nodal disease and brain metastases at initial diagnosis and now withevidence of disease progression in the left upper lobe lung mass despite current immunotherapy.   Today, I talked to the patient and family about the findings and workup thus far. We discussed the natural history of recurrent lung cancer and general treatment, highlighting the role of palliative radiotherapy in the managem Dr. [name] has personally reviewed her imaging and previous treatment records and agrees that it would be safe to proceed with re-irradiation in a 10 fraction course of palliative radiotherapy to the enlarging left upper lobe mass.  We discussed the available radiation techniques, and focused on the details of logistics and delivery. We reviewed the anticipated acute and late sequelae associated with radiation in this setting. The patient was encouraged to ask questions that were answered to her  stated satisfaction.  At the conclusion of our conversation, the patient elects to proceed with reirradiation to the enlarging left upper lobe lung mass delivered over the course of 10 daily treatments.  She has freely signed written consent to proceed today in the office and a copy of this document has been placed in her medical record.  She is scheduled for CT simulation on Thursday, 09/09/2018, at 2:30 pm, following her immunotherapy infusion (cycle 8), in anticipation of beginning radiation treatment to the left upper lobe around 09/13/2018.  She will also continue in routine follow-up under the care and direction of Dr. Earlie Server for management of her systemic disease with plans to continue with second line treatment immunotherapy with Opdivo.  She will be due for repeat brain MRI in April, prior to her scheduled follow-up visit with Dr. Mickeal Skinner on 10/26/2018.  She appears to have a good understanding of our recommendations and is comfortable with and in agreement with the stated plan.  She knows to call with any questions or concerns in the interim.     [name], PA-C  This document serves as a record of services personally performed by [employer], PA-C. It was created on her behalf by Joellen Jersey  [place], a trained medical scribe. The creation of this record is based on the scribe's personal observations and the provider's statements to them. This document has been checked and approved by the attending provider.

## 2018-09-07 NOTE — Progress Notes (Signed)
Thoracic Location of Tumor / Histology: left lung   Biopsies of   Tobacco/Marijuana/Snuff/ETOH use:  Past/Anticipated interventions by cardiothoracic surgery, if any:  Past/Anticipated interventions by medical oncology, if any:  1) Palliative radiotherapy to the large left upper lobe lung mass as well as metastatic brain lesions. 2)  systemic chemotherapy with carboplatin for AUC of 5 and paclitaxel 175 MG/M2 every 3 weeks. First dose on 04/15/2017.  Status post 6 cycles.  Paclitaxel has been reduced to 150 mg/M2 starting from cycle #4 secondary to peripheral neuropathy.  Last dose of chemotherapy was given July 29, 2017. Second line treatment with immunotherapy with Nivolumab 480 mg IV every 4 weeks.  First dose of February 25, 2018.  Status post 6 cycles.  Signs/Symptoms  Weight changes, if any: N/A  Respiratory complaints, if any: with activity  Hemoptysis, if any: Yes , sometime its bright red,sometime brown   Pain issues, if any:  Yes in her hands and feet   SAFETY ISSUES:  Prior radiation?Yes   Pacemaker/ICD? no  Possible current pregnancy?No  Is the patient on methotrexate? No  Current Complaints / other details:  Vitals:   09/07/18 1302  BP: 127/79  Pulse: (!) 110  Resp: 20  Temp: 97.6 F (36.4 C)  TempSrc: Oral  SpO2: 95%  Weight: 119 lb 2 oz (54 kg)  Height: 5\' 1"  (1.549 m)   Wt Readings from Last 3 Encounters:  09/07/18 119 lb 2 oz (54 kg)  08/12/18 120 lb 8 oz (54.7 kg)  07/27/18 122 lb 14.4 oz (55.7 kg)

## 2018-09-09 ENCOUNTER — Inpatient Hospital Stay: Payer: Medicare Other

## 2018-09-09 ENCOUNTER — Encounter: Payer: Self-pay | Admitting: Internal Medicine

## 2018-09-09 ENCOUNTER — Inpatient Hospital Stay: Payer: Medicare Other | Attending: Internal Medicine | Admitting: Internal Medicine

## 2018-09-09 ENCOUNTER — Ambulatory Visit: Admission: RE | Admit: 2018-09-09 | Payer: Medicare Other | Source: Ambulatory Visit | Admitting: Radiation Oncology

## 2018-09-09 ENCOUNTER — Telehealth: Payer: Self-pay | Admitting: Internal Medicine

## 2018-09-09 VITALS — BP 119/77 | HR 110 | Temp 98.0°F | Resp 20 | Ht 61.0 in | Wt 118.3 lb

## 2018-09-09 DIAGNOSIS — F329 Major depressive disorder, single episode, unspecified: Secondary | ICD-10-CM

## 2018-09-09 DIAGNOSIS — E279 Disorder of adrenal gland, unspecified: Secondary | ICD-10-CM

## 2018-09-09 DIAGNOSIS — R5383 Other fatigue: Secondary | ICD-10-CM

## 2018-09-09 DIAGNOSIS — Z5112 Encounter for antineoplastic immunotherapy: Secondary | ICD-10-CM | POA: Insufficient documentation

## 2018-09-09 DIAGNOSIS — R634 Abnormal weight loss: Secondary | ICD-10-CM

## 2018-09-09 DIAGNOSIS — C3412 Malignant neoplasm of upper lobe, left bronchus or lung: Secondary | ICD-10-CM | POA: Insufficient documentation

## 2018-09-09 DIAGNOSIS — R531 Weakness: Secondary | ICD-10-CM

## 2018-09-09 DIAGNOSIS — R0609 Other forms of dyspnea: Secondary | ICD-10-CM

## 2018-09-09 DIAGNOSIS — L989 Disorder of the skin and subcutaneous tissue, unspecified: Secondary | ICD-10-CM | POA: Diagnosis not present

## 2018-09-09 DIAGNOSIS — Z79899 Other long term (current) drug therapy: Secondary | ICD-10-CM | POA: Diagnosis not present

## 2018-09-09 DIAGNOSIS — R05 Cough: Secondary | ICD-10-CM

## 2018-09-09 DIAGNOSIS — J90 Pleural effusion, not elsewhere classified: Secondary | ICD-10-CM

## 2018-09-09 DIAGNOSIS — R630 Anorexia: Secondary | ICD-10-CM

## 2018-09-09 DIAGNOSIS — C7931 Secondary malignant neoplasm of brain: Secondary | ICD-10-CM | POA: Insufficient documentation

## 2018-09-09 DIAGNOSIS — R5382 Chronic fatigue, unspecified: Secondary | ICD-10-CM

## 2018-09-09 LAB — COMPREHENSIVE METABOLIC PANEL
ALT: 12 U/L (ref 0–44)
AST: 21 U/L (ref 15–41)
Albumin: 3.3 g/dL — ABNORMAL LOW (ref 3.5–5.0)
Alkaline Phosphatase: 94 U/L (ref 38–126)
Anion gap: 7 (ref 5–15)
BILIRUBIN TOTAL: 0.8 mg/dL (ref 0.3–1.2)
BUN: 8 mg/dL (ref 8–23)
CO2: 26 mmol/L (ref 22–32)
Calcium: 9.1 mg/dL (ref 8.9–10.3)
Chloride: 103 mmol/L (ref 98–111)
Creatinine, Ser: 0.64 mg/dL (ref 0.44–1.00)
GFR calc Af Amer: >60 mL/min (ref >60)
GFR calc non Af Amer: >60 mL/min (ref >60)
Glucose, Bld: 102 mg/dL — ABNORMAL HIGH (ref 70–99)
POTASSIUM: 4 mmol/L (ref 3.5–5.1)
Sodium: 136 mmol/L (ref 135–145)
TOTAL PROTEIN: 7.7 g/dL (ref 6.5–8.1)

## 2018-09-09 LAB — CBC WITH DIFFERENTIAL (CANCER CENTER ONLY)
Abs Immature Granulocytes: 0.02 10*3/uL (ref 0.00–0.07)
BASOS PCT: 1 %
Basophils Absolute: 0 10*3/uL (ref 0.0–0.1)
Eosinophils Absolute: 0.1 10*3/uL (ref 0.0–0.5)
Eosinophils Relative: 1 %
HCT: 38.9 % (ref 36.0–46.0)
Hemoglobin: 12.6 g/dL (ref 12.0–15.0)
Immature Granulocytes: 0 %
LYMPHS ABS: 1.8 10*3/uL (ref 0.7–4.0)
Lymphocytes Relative: 28 %
MCH: 33.7 pg (ref 26.0–34.0)
MCHC: 32.4 g/dL (ref 30.0–36.0)
MCV: 104 fL — ABNORMAL HIGH (ref 80.0–100.0)
MONOS PCT: 8 %
Monocytes Absolute: 0.5 10*3/uL (ref 0.1–1.0)
Neutro Abs: 3.9 10*3/uL (ref 1.7–7.7)
Neutrophils Relative %: 62 %
PLATELETS: 178 10*3/uL (ref 150–400)
RBC: 3.74 MIL/uL — ABNORMAL LOW (ref 3.87–5.11)
RDW: 13 % (ref 11.5–15.5)
WBC Count: 6.3 10*3/uL (ref 4.0–10.5)
nRBC: 0 % (ref 0.0–0.2)

## 2018-09-09 LAB — TSH: TSH: 0.733 u[IU]/mL (ref 0.308–3.960)

## 2018-09-09 MED ORDER — MIRTAZAPINE 15 MG PO TABS: 30.0000 mg | ORAL_TABLET | Freq: Every day | ORAL | 2 refills | Status: DC

## 2018-09-09 MED ORDER — SODIUM CHLORIDE 0.9 % IV SOLN
480.0000 mg | Freq: Once | INTRAVENOUS | Status: AC
  Administered 2018-09-09: 480 mg via INTRAVENOUS
  Filled 2018-09-09: qty 48

## 2018-09-09 MED ORDER — SODIUM CHLORIDE 0.9 % IV SOLN
Freq: Once | INTRAVENOUS | Status: AC
  Administered 2018-09-09: 13:00:00 via INTRAVENOUS
  Filled 2018-09-09: qty 250

## 2018-09-09 NOTE — Progress Notes (Signed)
[place] [phone]   Fax:([phone]) [place] [address]  DIAGNOSIS: stage IV(T2b, N3, M1c) non-small cell lung cancer, squamous cell carcinoma.  The patient presented with large left upper lobe lung mass in addition to left hilar and mediastinal lymphadenopathy and left true vocal cord paralysis. PDL 1 expression was 0%  PRIOR THERAPY:  1) Palliative radiotherapy to the large left upper lobe lung mass as well as metastatic brain lesions. 2)  systemic chemotherapy with carboplatin for AUC of 5 and paclitaxel 175 MG/M2 every 3 weeks. First dose on 04/15/2017.  Status post 6 cycles.  Paclitaxel has been reduced to 150 mg/M2 starting from cycle #4 secondary to peripheral neuropathy.  Last dose of chemotherapy was given July 29, 2017.  CURRENT THERAPY: Second line treatment with immunotherapy with Nivolumab 480 mg IV every 4 weeks.  First dose of February 25, 2018.  Status post 7 cycles.  INTERVAL HISTORY: Jacqueline Kidd 78 y.o. female returns to the clinic today for follow-up visit accompanied by her husband.  The patient continues to complain of increasing fatigue and weakness as well as cough with blood-tinged sputum.  She is supposed to start palliative radiotherapy to the enlarging left hilar mass under the care of Dr. [name] in the next few days.  She denied having any chest pain but has shortness of breath with exertion.  She has lack of appetite and weight loss.  She has no nausea, vomiting, diarrhea or constipation.  She denied having any skin rash or itching.  She continues to tolerate her treatment with immunotherapy fairly well.  MEDICAL HISTORY: Past Medical History:  Diagnosis Date  . Chronic low back pain   . COPD (chronic obstructive pulmonary disease) ([place])   . GERD (gastroesophageal reflux disease)   . Hyperlipidemia   . Hypertension   . Lesion of left lung   . lung ca dx'd  01/2017  . Osteoporosis     ALLERGIES:  is allergic to fosamax [alendronate]; hydrocodone-acetaminophen; and lisinopril-hydrochlorothiazide.  MEDICATIONS:  Current Outpatient Medications  Medication Sig Dispense Refill  . acetaminophen (TYLENOL) 325 MG tablet Take 650 mg by mouth every 6 (six) hours as needed for mild pain or moderate pain.     . Albuterol Sulfate 108 (90 Base) MCG/ACT AEPB Inhale 2 puffs into the lungs every 6 (six) hours as needed. 1 each 0  . cholecalciferol (VITAMIN D3) 25 MCG (1000 UT) tablet Take 1,000 Units by mouth daily.    . DULoxetine (CYMBALTA) 60 MG capsule Take 1 capsule (60 mg total) by mouth daily. (Patient not taking: Reported on 09/07/2018) 30 capsule 3  . gabapentin (NEURONTIN) 400 MG capsule Take 2 capsules (800 mg total) by mouth at bedtime. (Patient not taking: Reported on 09/07/2018) 270 capsule 3  . LORazepam (ATIVAN) 0.5 MG tablet Take 1 tablet (0.5 mg total) by mouth as needed for anxiety (take one tablet 30 minutes prior to MRI scans and radiation treatment). (Patient not taking: Reported on 09/07/2018) 10 tablet 0  . Omega-3 Fatty Acids (FISH OIL OMEGA-3 PO) Take by mouth.    . prochlorperazine (COMPAZINE) 10 MG tablet Take 10 mg by mouth every 6 (six) hours as needed for nausea or vomiting.     No current facility-administered medications for this visit.     SURGICAL HISTORY:  Past Surgical History:  Procedure Laterality Date  . ABDOMINAL HYSTERECTOMY    .  TOTAL HIP ARTHROPLASTY     right    REVIEW OF SYSTEMS:  A comprehensive review of systems was negative except for: Constitutional: positive for anorexia, fatigue and weight loss Respiratory: positive for cough, dyspnea on exertion and sputum Musculoskeletal: positive for muscle weakness   PHYSICAL EXAMINATION: General appearance: alert, cooperative, fatigued and no distress Head: Normocephalic, without obvious abnormality, atraumatic Neck: no adenopathy, no JVD, supple, symmetrical, trachea  midline and thyroid not enlarged, symmetric, no tenderness/mass/nodules Lymph nodes: Cervical, supraclavicular, and axillary nodes normal. Resp: clear to auscultation bilaterally Back: symmetric, no curvature. ROM normal. No CVA tenderness. Cardio: regular rate and rhythm, S1, S2 normal, no murmur, click, rub or gallop GI: soft, non-tender; bowel sounds normal; no masses,  no organomegaly Extremities: extremities normal, atraumatic, no cyanosis or edema  ECOG PERFORMANCE STATUS: 1 - Symptomatic but completely ambulatory  Blood pressure 119/77, pulse (!) 110, temperature 98 F (36.7 C), temperature source Oral, resp. rate 20, height 5\' 1"  (1.549 m), weight 118 lb 4.8 oz (53.7 kg), SpO2 96 %.  LABORATORY DATA: Lab Results  Component Value Date   WBC 6.3 09/09/2018   HGB 12.6 09/09/2018   HCT 38.9 09/09/2018   MCV 104.0 (H) 09/09/2018   PLT 178 09/09/2018      Chemistry      Component Value Date/Time   NA 139 08/12/2018 0926   NA 138 07/08/2017 0831   K 3.7 08/12/2018 0926   K 3.3 (L) 07/08/2017 0831   CL 104 08/12/2018 0926   CO2 26 08/12/2018 0926   CO2 24 07/08/2017 0831   BUN 8 08/12/2018 0926   BUN 4.5 (L) 07/08/2017 0831   CREATININE 0.71 08/12/2018 0926   CREATININE 0.6 07/08/2017 0831      Component Value Date/Time   CALCIUM 9.0 08/12/2018 0926   CALCIUM 9.0 07/08/2017 0831   ALKPHOS 90 08/12/2018 0926   ALKPHOS 119 07/08/2017 0831   AST 16 08/12/2018 0926   AST 25 07/08/2017 0831   ALT 9 08/12/2018 0926   ALT 16 07/08/2017 0831   BILITOT 1.0 08/12/2018 0926   BILITOT 0.52 07/08/2017 0831       RADIOGRAPHIC STUDIES: Ct Chest W Contrast  Result Date: 08/10/2018 CLINICAL DATA:  Stage IV left upper lobe non-small-cell lung cancer EXAM: CT CHEST, ABDOMEN, AND PELVIS WITH CONTRAST TECHNIQUE: Multidetector CT imaging of the chest, abdomen and pelvis was performed following the standard protocol during bolus administration of intravenous contrast. CONTRAST:  14mL  OMNIPAQUE IOHEXOL 300 MG/ML  SOLN COMPARISON:  05/17/2018 FINDINGS: CT CHEST FINDINGS Cardiovascular: Heart size normal. Small pericardial effusion is stable. Coronary artery calcification is evident. Atherosclerotic calcification is noted in the wall of the thoracic aorta. Aberrant origin right subclavian artery noted. Mediastinum/Nodes: No mediastinal lymphadenopathy. No right hilar lymphadenopathy. Abnormal soft tissue attenuation in the left hilum blends into the left upper lobe collapse/consolidation is likely treatment related. The esophagus has normal imaging features. There is no axillary lymphadenopathy. Lungs/Pleura: Interval progression of left upper lobe collapse medially against the mediastinum. Within the collapse lung is a 2.1 x 1.8 cm necrotic lesion which may represent the primary malignancy. Subpleural reticulation in the lungs bilaterally suggests a component of underlying fibrotic lung disease. Small to moderate left pleural effusion is progressive in the interval in includes a loculated component in the left apex. Musculoskeletal: No worrisome lytic or sclerotic osseous abnormality. CT ABDOMEN PELVIS FINDINGS Hepatobiliary: No suspicious focal abnormality within the liver parenchyma. Tiny calcified gallstones evident. No intrahepatic or extrahepatic  biliary dilation. Pancreas: No focal mass lesion. No dilatation of the main duct. No intraparenchymal cyst. No peripancreatic edema. Spleen: No splenomegaly. No focal mass lesion. Adrenals/Urinary Tract: No adrenal nodule or mass. Kidneys unremarkable. Proximal ureters unremarkable. Distal ureters obscured by beam hardening artifact from right hip replacement. Bladder largely obscured by artifact. Stomach/Bowel: Stomach is unremarkable. No gastric wall thickening. No evidence of outlet obstruction. Duodenum is normally positioned as is the ligament of Treitz. No small bowel wall thickening. No small bowel dilatation. The terminal ileum is normal. The  appendix is not visualized, but there is no edema or inflammation in the region of the cecum. No gross colonic mass. No colonic wall thickening. Vascular/Lymphatic: There is abdominal aortic atherosclerosis without aneurysm. There is no gastrohepatic or hepatoduodenal ligament lymphadenopathy. No intraperitoneal or retroperitoneal lymphadenopathy. No pelvic sidewall lymphadenopathy. Reproductive: Uterus surgically absent.  There is no adnexal mass. Other: No intraperitoneal free fluid. Musculoskeletal: Status post right total hip replacement. No worrisome lytic or sclerotic osseous abnormality. Compression deformity at L2, L3, L4, and L5 is stable. IMPRESSION: 1. Interval progression of left upper lobe collapse/consolidation with 2.1 x 1.8 cm necrotic lesion in the collapsed lung, likely corresponding to the 3.3 x 1.7 cm left upper lobe mass noted previously. 2. Interval continued progression of left pleural effusion. 3. Right adrenal nodule seen on the previous study has decreased in the interval. No other evidence for metastatic disease in the abdomen or pelvis. 4. Cholelithiasis. 5.  Aortic Atherosclerois (ICD10-170.0) Electronically Signed   By: Misty Stanley M.D.   On: 08/10/2018 14:54   Ct Abdomen Pelvis W Contrast  Result Date: 08/10/2018 CLINICAL DATA:  Stage IV left upper lobe non-small-cell lung cancer EXAM: CT CHEST, ABDOMEN, AND PELVIS WITH CONTRAST TECHNIQUE: Multidetector CT imaging of the chest, abdomen and pelvis was performed following the standard protocol during bolus administration of intravenous contrast. CONTRAST:  116mL OMNIPAQUE IOHEXOL 300 MG/ML  SOLN COMPARISON:  05/17/2018 FINDINGS: CT CHEST FINDINGS Cardiovascular: Heart size normal. Small pericardial effusion is stable. Coronary artery calcification is evident. Atherosclerotic calcification is noted in the wall of the thoracic aorta. Aberrant origin right subclavian artery noted. Mediastinum/Nodes: No mediastinal lymphadenopathy. No  right hilar lymphadenopathy. Abnormal soft tissue attenuation in the left hilum blends into the left upper lobe collapse/consolidation is likely treatment related. The esophagus has normal imaging features. There is no axillary lymphadenopathy. Lungs/Pleura: Interval progression of left upper lobe collapse medially against the mediastinum. Within the collapse lung is a 2.1 x 1.8 cm necrotic lesion which may represent the primary malignancy. Subpleural reticulation in the lungs bilaterally suggests a component of underlying fibrotic lung disease. Small to moderate left pleural effusion is progressive in the interval in includes a loculated component in the left apex. Musculoskeletal: No worrisome lytic or sclerotic osseous abnormality. CT ABDOMEN PELVIS FINDINGS Hepatobiliary: No suspicious focal abnormality within the liver parenchyma. Tiny calcified gallstones evident. No intrahepatic or extrahepatic biliary dilation. Pancreas: No focal mass lesion. No dilatation of the main duct. No intraparenchymal cyst. No peripancreatic edema. Spleen: No splenomegaly. No focal mass lesion. Adrenals/Urinary Tract: No adrenal nodule or mass. Kidneys unremarkable. Proximal ureters unremarkable. Distal ureters obscured by beam hardening artifact from right hip replacement. Bladder largely obscured by artifact. Stomach/Bowel: Stomach is unremarkable. No gastric wall thickening. No evidence of outlet obstruction. Duodenum is normally positioned as is the ligament of Treitz. No small bowel wall thickening. No small bowel dilatation. The terminal ileum is normal. The appendix is not visualized, but there is  no edema or inflammation in the region of the cecum. No gross colonic mass. No colonic wall thickening. Vascular/Lymphatic: There is abdominal aortic atherosclerosis without aneurysm. There is no gastrohepatic or hepatoduodenal ligament lymphadenopathy. No intraperitoneal or retroperitoneal lymphadenopathy. No pelvic sidewall  lymphadenopathy. Reproductive: Uterus surgically absent.  There is no adnexal mass. Other: No intraperitoneal free fluid. Musculoskeletal: Status post right total hip replacement. No worrisome lytic or sclerotic osseous abnormality. Compression deformity at L2, L3, L4, and L5 is stable. IMPRESSION: 1. Interval progression of left upper lobe collapse/consolidation with 2.1 x 1.8 cm necrotic lesion in the collapsed lung, likely corresponding to the 3.3 x 1.7 cm left upper lobe mass noted previously. 2. Interval continued progression of left pleural effusion. 3. Right adrenal nodule seen on the previous study has decreased in the interval. No other evidence for metastatic disease in the abdomen or pelvis. 4. Cholelithiasis. 5.  Aortic Atherosclerois (ICD10-170.0) Electronically Signed   By: Misty Stanley M.D.   On: 08/10/2018 14:54    ASSESSMENT AND PLAN: This is a very pleasant 78 years old white female with stage IV (T2b, N3, M1 C) non-small cell lung cancer, squamous cell carcinoma presented with large left upper lobe lung mass in addition to mediastinal and supraclavicular lymphadenopathy as well as metastatic brain lesions diagnosed in September 2018. The patient underwent a course of palliative radiotherapy to the left upper lobe lung mass as well as the metastatic brain lesions. She is currently undergoing palliative systemic chemotherapy with carboplatin for AUC of 5 and paclitaxel 175 mg/M2 every 3 weeks, status post 6 cycles. She has been in observation for several months. Repeat CT scan of the chest, abdomen and pelvis showed evidence for disease progression with enlargement of right adrenal nodule in addition to progressive enlargement of small left pleural effusion in addition to the right parietal scalp lesion.  The patient also was found to have evidence for progressive disease in the brain. She was started on second line treatment with immunotherapy was Nivolumab 480 mg IV every 4 weeks status  post 7 cycles. She has been tolerating this treatment well with no concerning adverse effects. Repeat imaging studies including CT scan of the chest, abdomen and pelvis showed further increase in the size of the left upper lobe lung mass as well as increase in the left pleural effusion.  No other concerning findings for disease progression in the abdomen or pelvis and the right adrenal gland nodule is smaller. The patient was referred to Dr. [name] for consideration of short course of palliative radiotherapy to the enlarging left upper lobe lung mass and we would continue with her current treatment with nivolumab for now. I recommended for the patient to proceed with cycle #8 today as scheduled. For the depression, lack of appetite and weight loss, I will start the patient on Remeron 15 mg p.o. nightly. The patient will come back for follow-up visit in 4 weeks for evaluation before starting cycle #9. She was advised to call immediately if she has any concerning symptoms in the interval. The patient voices understanding of current disease status and treatment options and is in agreement with the current care plan. All questions were answered. The patient knows to call the clinic with any problems, questions or concerns. We can certainly see the patient much sooner if necessary.  Disclaimer: This note was dictated with voice recognition software. Similar sounding words can inadvertently be transcribed and may not be corrected upon review.

## 2018-09-09 NOTE — Patient Instructions (Signed)
[facility] Discharge Instructions for Patients Receiving Chemotherapy  Today you received the following chemotherapy agents: Nivolumab  To help prevent nausea and vomiting after your treatment, we encourage you to take your nausea medication as directed.    If you develop nausea and vomiting that is not controlled by your nausea medication, call the clinic.   BELOW ARE SYMPTOMS THAT SHOULD BE REPORTED IMMEDIATELY:  *FEVER GREATER THAN 100.5 F  *CHILLS WITH OR WITHOUT FEVER  NAUSEA AND VOMITING THAT IS NOT CONTROLLED WITH YOUR NAUSEA MEDICATION  *UNUSUAL SHORTNESS OF BREATH  *UNUSUAL BRUISING OR BLEEDING  TENDERNESS IN MOUTH AND THROAT WITH OR WITHOUT PRESENCE OF ULCERS  *URINARY PROBLEMS  *BOWEL PROBLEMS  UNUSUAL RASH Items with * indicate a potential emergency and should be followed up as soon as possible.  Feel free to call the clinic should you have any questions or concerns. The clinic phone number is [phone].  Please show the CHEMO ALERT CARD at check-in to the Emergency Department and triage nurse.   

## 2018-09-09 NOTE — Telephone Encounter (Signed)
Scheduled appt per 3/5 los - pt to get an updated schedule next visit.

## 2018-09-10 ENCOUNTER — Ambulatory Visit
Admission: RE | Admit: 2018-09-10 | Discharge: 2018-09-10 | Disposition: A | Discharge status: Home / Self Care | Payer: Medicare Other | Source: Ambulatory Visit | Attending: Radiation Oncology | Admitting: Radiation Oncology

## 2018-09-10 DIAGNOSIS — Z510 Encounter for antineoplastic radiation therapy: Secondary | ICD-10-CM | POA: Diagnosis not present

## 2018-09-10 DIAGNOSIS — C3412 Malignant neoplasm of upper lobe, left bronchus or lung: Secondary | ICD-10-CM | POA: Insufficient documentation

## 2018-09-10 DIAGNOSIS — C7931 Secondary malignant neoplasm of brain: Secondary | ICD-10-CM | POA: Diagnosis not present

## 2018-09-10 DIAGNOSIS — C771 Secondary and unspecified malignant neoplasm of intrathoracic lymph nodes: Secondary | ICD-10-CM | POA: Insufficient documentation

## 2018-09-10 NOTE — Progress Notes (Signed)
  Radiation Oncology         [phone] ________________________________  Name: Jacqueline Kidd MRN: [id number]  Date: 09/10/2018  DOB: [birth year]  SIMULATION AND TREATMENT PLANNING NOTE    ICD-10-CM   1. Primary cancer of left upper lobe of lung ([place]) C34.12     DIAGNOSIS:  78 y.o.female with stage IV (T2b, N3, M1 C) NSCLC, squamous cell carcinoma of the left upper lung with neck and mediastinal nodal disease and brain metastases at initial diagnosis and now with evidence of disease progression in the left upper lobe lung mass despite current immunotherapy.  NARRATIVE:  The patient was brought to the [facility].  Identity was confirmed.  All relevant records and images related to the planned course of therapy were reviewed.  The patient freely provided informed written consent to proceed with treatment after reviewing the details related to the planned course of therapy. The consent form was witnessed and verified by the simulation staff.  Then, the patient was set-up in a stable reproducible  supine position for radiation therapy.  CT images were obtained.  Surface markings were placed.  The CT images were loaded into the planning software.  Then the target and avoidance structures were contoured.  Treatment planning then occurred.  The radiation prescription was entered and confirmed.  Then, I designed and supervised the construction of a total of 6 medically necessary complex treatment devices, including a BodyFix immobilization mold custom fitted to the patient along with 5 multileaf collimators conformally shaped radiation around the treatment target while shielding critical structures such as the heart and spinal cord maximally.  I have requested : 3D Simulation  I have requested a DVH of the following structures: Left lung, right lung, spinal cord, heart, esophagus, and target.  I have ordered:Nutrition Consult  SPECIAL TREATMENT PROCEDURE:  The planned course of therapy  using radiation constitutes a special treatment procedure. Special care is required in the management of this patient for the following reasons. This treatment constitutes a Special Treatment Procedure for the following reason: [ Retreatment in a previously radiated area requiring careful monitoring of increased risk of toxicity due to overlap of previous treatment..  The special nature of the planned course of radiotherapy will require increased physician supervision and oversight to ensure patient's safety with optimal treatment outcomes.  PLAN:  The patient will receive 30 Gy in 10 fractions.  ________________________________  Sheral Apley [name], M.D.

## 2018-09-14 ENCOUNTER — Ambulatory Visit
Admission: RE | Admit: 2018-09-14 | Discharge: 2018-09-14 | Disposition: A | Discharge status: Home / Self Care | Payer: Medicare Other | Source: Ambulatory Visit | Attending: Radiation Oncology | Admitting: Radiation Oncology

## 2018-09-14 DIAGNOSIS — Z510 Encounter for antineoplastic radiation therapy: Secondary | ICD-10-CM | POA: Diagnosis not present

## 2018-09-14 DIAGNOSIS — C3412 Malignant neoplasm of upper lobe, left bronchus or lung: Secondary | ICD-10-CM | POA: Diagnosis not present

## 2018-09-14 DIAGNOSIS — C771 Secondary and unspecified malignant neoplasm of intrathoracic lymph nodes: Secondary | ICD-10-CM | POA: Diagnosis not present

## 2018-09-14 DIAGNOSIS — C7931 Secondary malignant neoplasm of brain: Secondary | ICD-10-CM | POA: Diagnosis not present

## 2018-09-15 ENCOUNTER — Other Ambulatory Visit: Payer: Self-pay

## 2018-09-15 ENCOUNTER — Ambulatory Visit
Admission: RE | Admit: 2018-09-15 | Discharge: 2018-09-15 | Disposition: A | Discharge status: Home / Self Care | Payer: Medicare Other | Source: Ambulatory Visit | Attending: Radiation Oncology | Admitting: Radiation Oncology

## 2018-09-15 DIAGNOSIS — C7931 Secondary malignant neoplasm of brain: Secondary | ICD-10-CM | POA: Diagnosis not present

## 2018-09-15 DIAGNOSIS — Z510 Encounter for antineoplastic radiation therapy: Secondary | ICD-10-CM | POA: Diagnosis not present

## 2018-09-15 DIAGNOSIS — C771 Secondary and unspecified malignant neoplasm of intrathoracic lymph nodes: Secondary | ICD-10-CM | POA: Diagnosis not present

## 2018-09-15 DIAGNOSIS — C3412 Malignant neoplasm of upper lobe, left bronchus or lung: Secondary | ICD-10-CM | POA: Diagnosis not present

## 2018-09-16 ENCOUNTER — Ambulatory Visit
Admission: RE | Admit: 2018-09-16 | Discharge: 2018-09-16 | Disposition: A | Discharge status: Home / Self Care | Payer: Medicare Other | Source: Ambulatory Visit | Attending: Radiation Oncology | Admitting: Radiation Oncology

## 2018-09-16 ENCOUNTER — Other Ambulatory Visit: Payer: Self-pay

## 2018-09-16 DIAGNOSIS — C3412 Malignant neoplasm of upper lobe, left bronchus or lung: Secondary | ICD-10-CM | POA: Diagnosis not present

## 2018-09-16 DIAGNOSIS — C771 Secondary and unspecified malignant neoplasm of intrathoracic lymph nodes: Secondary | ICD-10-CM | POA: Diagnosis not present

## 2018-09-16 DIAGNOSIS — Z510 Encounter for antineoplastic radiation therapy: Secondary | ICD-10-CM | POA: Diagnosis not present

## 2018-09-16 DIAGNOSIS — C7931 Secondary malignant neoplasm of brain: Secondary | ICD-10-CM | POA: Diagnosis not present

## 2018-09-17 ENCOUNTER — Ambulatory Visit
Admission: RE | Admit: 2018-09-17 | Discharge: 2018-09-17 | Disposition: A | Discharge status: Home / Self Care | Payer: Medicare Other | Source: Ambulatory Visit | Attending: Radiation Oncology | Admitting: Radiation Oncology

## 2018-09-17 ENCOUNTER — Other Ambulatory Visit: Payer: Self-pay

## 2018-09-17 DIAGNOSIS — C771 Secondary and unspecified malignant neoplasm of intrathoracic lymph nodes: Secondary | ICD-10-CM | POA: Diagnosis not present

## 2018-09-17 DIAGNOSIS — C3412 Malignant neoplasm of upper lobe, left bronchus or lung: Secondary | ICD-10-CM | POA: Diagnosis not present

## 2018-09-17 DIAGNOSIS — C7931 Secondary malignant neoplasm of brain: Secondary | ICD-10-CM | POA: Diagnosis not present

## 2018-09-17 DIAGNOSIS — Z510 Encounter for antineoplastic radiation therapy: Secondary | ICD-10-CM | POA: Diagnosis not present

## 2018-09-20 ENCOUNTER — Other Ambulatory Visit: Payer: Self-pay

## 2018-09-20 ENCOUNTER — Ambulatory Visit: Payer: Medicare Other

## 2018-09-20 ENCOUNTER — Ambulatory Visit
Admission: RE | Admit: 2018-09-20 | Discharge: 2018-09-20 | Disposition: A | Discharge status: Home / Self Care | Payer: Medicare Other | Source: Ambulatory Visit | Attending: Radiation Oncology | Admitting: Radiation Oncology

## 2018-09-20 DIAGNOSIS — C3412 Malignant neoplasm of upper lobe, left bronchus or lung: Secondary | ICD-10-CM | POA: Diagnosis not present

## 2018-09-20 DIAGNOSIS — C7931 Secondary malignant neoplasm of brain: Secondary | ICD-10-CM | POA: Diagnosis not present

## 2018-09-20 DIAGNOSIS — Z510 Encounter for antineoplastic radiation therapy: Secondary | ICD-10-CM | POA: Diagnosis not present

## 2018-09-20 DIAGNOSIS — C771 Secondary and unspecified malignant neoplasm of intrathoracic lymph nodes: Secondary | ICD-10-CM | POA: Diagnosis not present

## 2018-09-21 ENCOUNTER — Other Ambulatory Visit: Payer: Self-pay

## 2018-09-21 ENCOUNTER — Ambulatory Visit: Payer: Medicare Other

## 2018-09-21 ENCOUNTER — Ambulatory Visit
Admission: RE | Admit: 2018-09-21 | Discharge: 2018-09-21 | Disposition: A | Discharge status: Home / Self Care | Payer: Medicare Other | Source: Ambulatory Visit | Attending: Radiation Oncology | Admitting: Radiation Oncology

## 2018-09-21 DIAGNOSIS — C7931 Secondary malignant neoplasm of brain: Secondary | ICD-10-CM | POA: Diagnosis not present

## 2018-09-21 DIAGNOSIS — C771 Secondary and unspecified malignant neoplasm of intrathoracic lymph nodes: Secondary | ICD-10-CM | POA: Diagnosis not present

## 2018-09-21 DIAGNOSIS — C3412 Malignant neoplasm of upper lobe, left bronchus or lung: Secondary | ICD-10-CM | POA: Diagnosis not present

## 2018-09-21 DIAGNOSIS — Z510 Encounter for antineoplastic radiation therapy: Secondary | ICD-10-CM | POA: Diagnosis not present

## 2018-09-22 ENCOUNTER — Ambulatory Visit: Payer: Medicare Other

## 2018-09-22 ENCOUNTER — Ambulatory Visit
Admission: RE | Admit: 2018-09-22 | Discharge: 2018-09-22 | Disposition: A | Discharge status: Home / Self Care | Payer: Medicare Other | Source: Ambulatory Visit | Attending: Radiation Oncology | Admitting: Radiation Oncology

## 2018-09-22 ENCOUNTER — Other Ambulatory Visit: Payer: Self-pay

## 2018-09-22 DIAGNOSIS — C3412 Malignant neoplasm of upper lobe, left bronchus or lung: Secondary | ICD-10-CM | POA: Diagnosis not present

## 2018-09-22 DIAGNOSIS — Z510 Encounter for antineoplastic radiation therapy: Secondary | ICD-10-CM | POA: Diagnosis not present

## 2018-09-22 DIAGNOSIS — C771 Secondary and unspecified malignant neoplasm of intrathoracic lymph nodes: Secondary | ICD-10-CM | POA: Diagnosis not present

## 2018-09-22 DIAGNOSIS — C7931 Secondary malignant neoplasm of brain: Secondary | ICD-10-CM | POA: Diagnosis not present

## 2018-09-23 ENCOUNTER — Ambulatory Visit
Admission: RE | Admit: 2018-09-23 | Discharge: 2018-09-23 | Disposition: A | Discharge status: Home / Self Care | Payer: Medicare Other | Source: Ambulatory Visit | Attending: Radiation Oncology | Admitting: Radiation Oncology

## 2018-09-23 ENCOUNTER — Ambulatory Visit: Payer: Medicare Other

## 2018-09-23 ENCOUNTER — Other Ambulatory Visit: Payer: Self-pay

## 2018-09-23 DIAGNOSIS — C3412 Malignant neoplasm of upper lobe, left bronchus or lung: Secondary | ICD-10-CM | POA: Diagnosis not present

## 2018-09-23 DIAGNOSIS — C7931 Secondary malignant neoplasm of brain: Secondary | ICD-10-CM | POA: Diagnosis not present

## 2018-09-23 DIAGNOSIS — C771 Secondary and unspecified malignant neoplasm of intrathoracic lymph nodes: Secondary | ICD-10-CM | POA: Diagnosis not present

## 2018-09-23 DIAGNOSIS — Z510 Encounter for antineoplastic radiation therapy: Secondary | ICD-10-CM | POA: Diagnosis not present

## 2018-09-24 ENCOUNTER — Ambulatory Visit: Payer: Medicare Other

## 2018-09-24 ENCOUNTER — Other Ambulatory Visit: Payer: Self-pay

## 2018-09-24 ENCOUNTER — Ambulatory Visit
Admission: RE | Admit: 2018-09-24 | Discharge: 2018-09-24 | Disposition: A | Discharge status: Home / Self Care | Payer: Medicare Other | Source: Ambulatory Visit | Attending: Radiation Oncology | Admitting: Radiation Oncology

## 2018-09-24 DIAGNOSIS — C771 Secondary and unspecified malignant neoplasm of intrathoracic lymph nodes: Secondary | ICD-10-CM | POA: Diagnosis not present

## 2018-09-24 DIAGNOSIS — Z510 Encounter for antineoplastic radiation therapy: Secondary | ICD-10-CM | POA: Diagnosis not present

## 2018-09-24 DIAGNOSIS — C7931 Secondary malignant neoplasm of brain: Secondary | ICD-10-CM | POA: Diagnosis not present

## 2018-09-24 DIAGNOSIS — C3412 Malignant neoplasm of upper lobe, left bronchus or lung: Secondary | ICD-10-CM | POA: Diagnosis not present

## 2018-09-27 ENCOUNTER — Ambulatory Visit: Payer: Medicare Other

## 2018-09-27 ENCOUNTER — Ambulatory Visit
Admission: RE | Admit: 2018-09-27 | Discharge: 2018-09-27 | Disposition: A | Discharge status: Home / Self Care | Payer: Medicare Other | Source: Ambulatory Visit | Attending: Radiation Oncology | Admitting: Radiation Oncology

## 2018-09-27 ENCOUNTER — Encounter: Payer: Self-pay | Admitting: Radiation Oncology

## 2018-09-27 ENCOUNTER — Other Ambulatory Visit: Payer: Self-pay

## 2018-09-27 DIAGNOSIS — C771 Secondary and unspecified malignant neoplasm of intrathoracic lymph nodes: Secondary | ICD-10-CM | POA: Diagnosis not present

## 2018-09-27 DIAGNOSIS — C3412 Malignant neoplasm of upper lobe, left bronchus or lung: Secondary | ICD-10-CM | POA: Diagnosis not present

## 2018-09-27 DIAGNOSIS — C7931 Secondary malignant neoplasm of brain: Secondary | ICD-10-CM | POA: Diagnosis not present

## 2018-09-27 DIAGNOSIS — Z510 Encounter for antineoplastic radiation therapy: Secondary | ICD-10-CM | POA: Diagnosis not present

## 2018-09-28 ENCOUNTER — Ambulatory Visit: Payer: Medicare Other

## 2018-09-29 ENCOUNTER — Ambulatory Visit: Payer: Medicare Other

## 2018-09-30 ENCOUNTER — Ambulatory Visit: Payer: Medicare Other

## 2018-09-30 ENCOUNTER — Telehealth: Payer: Self-pay | Admitting: *Deleted

## 2018-09-30 NOTE — Telephone Encounter (Signed)
CALLED PATIENT TO INFORM OF FU ON 10-20-18 @ 11 AM , VIA PHONE DUE TO COVID 19, PATIENT VERIFIED UNDERSTANDING THIS

## 2018-10-01 ENCOUNTER — Encounter: Payer: Self-pay | Admitting: Radiation Oncology

## 2018-10-01 ENCOUNTER — Ambulatory Visit: Payer: Medicare Other

## 2018-10-01 NOTE — Progress Notes (Signed)
°  Radiation Oncology         [phone] ________________________________  Name: Jacqueline Kidd MRN: [id number]  Date: 10/01/2018  DOB: Nov 29, 1940  End of Treatment Note  Diagnosis:   78 y.o.female withstage IV (T2b, N3, M1 C), NSCLC, squamous cell carcinoma of the left upper lung with metastatic mediastinal lymphadenopathy and brain metastases at diagnosis, now with evidence of disease progression in the left upper lobe lung mass despite current immunotherapy.     Indication for treatment:  Palliative       Radiation treatment dates:   09/14/2018 - 09/27/2018  Site/dose:   Left Lung (retreat) / 30 Gy in 10 fractions  Beams/energy:   3D, photons / 10X, 15X, 6X  Narrative: The patient tolerated radiation treatment relatively well. She reported a productive cough with occasional scant hemoptysis, occasional difficulty swallowing, hoarseness, intermittent episodes of headache, nausea, and vomiting, and easily becoming short of breath. She denied pain, diplopia, change in appetite, difficulty sleeping, and recent falls.   Plan: The patient has completed radiation treatment. The patient will return to radiation oncology clinic for routine followup in one month. I advised her to call or return sooner if she has any questions or concerns related to her recovery or treatment. ________________________________  Sheral Apley. [name], M.D.  This document serves as a record of services personally performed by Tyler Pita, MD. It was created on his behalf by Wilburn Mylar, a trained medical scribe. The creation of this record is based on the scribe's personal observations and the provider's statements to them. This document has been checked and approved by the attending provider.

## 2018-10-07 ENCOUNTER — Inpatient Hospital Stay: Payer: Medicare Other

## 2018-10-07 ENCOUNTER — Encounter: Payer: Self-pay | Admitting: Internal Medicine

## 2018-10-07 ENCOUNTER — Other Ambulatory Visit: Payer: Self-pay

## 2018-10-07 ENCOUNTER — Inpatient Hospital Stay: Payer: Medicare Other | Attending: Internal Medicine | Admitting: Internal Medicine

## 2018-10-07 VITALS — BP 116/75 | HR 109 | Temp 97.7°F | Resp 18 | Ht 61.0 in | Wt 117.0 lb

## 2018-10-07 VITALS — HR 96

## 2018-10-07 DIAGNOSIS — R634 Abnormal weight loss: Secondary | ICD-10-CM

## 2018-10-07 DIAGNOSIS — C3412 Malignant neoplasm of upper lobe, left bronchus or lung: Secondary | ICD-10-CM | POA: Diagnosis not present

## 2018-10-07 DIAGNOSIS — E279 Disorder of adrenal gland, unspecified: Secondary | ICD-10-CM | POA: Diagnosis not present

## 2018-10-07 DIAGNOSIS — J90 Pleural effusion, not elsewhere classified: Secondary | ICD-10-CM

## 2018-10-07 DIAGNOSIS — Z5112 Encounter for antineoplastic immunotherapy: Secondary | ICD-10-CM | POA: Diagnosis not present

## 2018-10-07 DIAGNOSIS — T451X5A Adverse effect of antineoplastic and immunosuppressive drugs, initial encounter: Secondary | ICD-10-CM

## 2018-10-07 DIAGNOSIS — Z79899 Other long term (current) drug therapy: Secondary | ICD-10-CM | POA: Insufficient documentation

## 2018-10-07 DIAGNOSIS — R531 Weakness: Secondary | ICD-10-CM

## 2018-10-07 DIAGNOSIS — R630 Anorexia: Secondary | ICD-10-CM

## 2018-10-07 DIAGNOSIS — C7931 Secondary malignant neoplasm of brain: Secondary | ICD-10-CM

## 2018-10-07 DIAGNOSIS — C3490 Malignant neoplasm of unspecified part of unspecified bronchus or lung: Secondary | ICD-10-CM

## 2018-10-07 DIAGNOSIS — G620 Drug-induced polyneuropathy: Secondary | ICD-10-CM

## 2018-10-07 DIAGNOSIS — F329 Major depressive disorder, single episode, unspecified: Secondary | ICD-10-CM

## 2018-10-07 DIAGNOSIS — R0609 Other forms of dyspnea: Secondary | ICD-10-CM

## 2018-10-07 DIAGNOSIS — R5382 Chronic fatigue, unspecified: Secondary | ICD-10-CM

## 2018-10-07 DIAGNOSIS — L989 Disorder of the skin and subcutaneous tissue, unspecified: Secondary | ICD-10-CM

## 2018-10-07 LAB — CBC WITH DIFFERENTIAL (CANCER CENTER ONLY)
Abs Immature Granulocytes: 0.02 10*3/uL (ref 0.00–0.07)
Basophils Absolute: 0 10*3/uL (ref 0.0–0.1)
Basophils Relative: 1 %
Eosinophils Absolute: 0.1 10*3/uL (ref 0.0–0.5)
Eosinophils Relative: 2 %
HCT: 37.4 % (ref 36.0–46.0)
Hemoglobin: 12 g/dL (ref 12.0–15.0)
Immature Granulocytes: 0 %
Lymphocytes Relative: 18 %
Lymphs Abs: 1 10*3/uL (ref 0.7–4.0)
MCH: 33.4 pg (ref 26.0–34.0)
MCHC: 32.1 g/dL (ref 30.0–36.0)
MCV: 104.2 fL — ABNORMAL HIGH (ref 80.0–100.0)
Monocytes Absolute: 0.5 10*3/uL (ref 0.1–1.0)
Monocytes Relative: 10 %
Neutro Abs: 3.8 10*3/uL (ref 1.7–7.7)
Neutrophils Relative %: 69 %
Platelet Count: 199 10*3/uL (ref 150–400)
RBC: 3.59 MIL/uL — ABNORMAL LOW (ref 3.87–5.11)
RDW: 13.2 % (ref 11.5–15.5)
WBC Count: 5.5 10*3/uL (ref 4.0–10.5)
nRBC: 0 % (ref 0.0–0.2)

## 2018-10-07 LAB — CMP (CANCER CENTER ONLY)
ALT: 9 U/L (ref 0–44)
AST: 18 U/L (ref 15–41)
Albumin: 3.1 g/dL — ABNORMAL LOW (ref 3.5–5.0)
Alkaline Phosphatase: 102 U/L (ref 38–126)
Anion gap: 9 (ref 5–15)
BUN: 8 mg/dL (ref 8–23)
CO2: 24 mmol/L (ref 22–32)
Calcium: 8.7 mg/dL — ABNORMAL LOW (ref 8.9–10.3)
Chloride: 104 mmol/L (ref 98–111)
Creatinine: 0.68 mg/dL (ref 0.44–1.00)
GFR, Est AFR Am: >60 mL/min (ref >60)
GFR, Estimated: >60 mL/min (ref >60)
Glucose, Bld: 134 mg/dL — ABNORMAL HIGH (ref 70–99)
Potassium: 3.8 mmol/L (ref 3.5–5.1)
Sodium: 137 mmol/L (ref 135–145)
Total Bilirubin: 0.6 mg/dL (ref 0.3–1.2)
Total Protein: 7.6 g/dL (ref 6.5–8.1)

## 2018-10-07 LAB — TSH: TSH: 1.228 u[IU]/mL (ref 0.308–3.960)

## 2018-10-07 MED ORDER — SODIUM CHLORIDE 0.9 % IV SOLN
Freq: Once | INTRAVENOUS | Status: AC
  Administered 2018-10-07: 12:00:00 via INTRAVENOUS
  Filled 2018-10-07: qty 250

## 2018-10-07 MED ORDER — SODIUM CHLORIDE 0.9 % IV SOLN
480.0000 mg | Freq: Once | INTRAVENOUS | Status: AC
  Administered 2018-10-07: 480 mg via INTRAVENOUS
  Filled 2018-10-07: qty 48

## 2018-10-07 NOTE — Patient Instructions (Signed)
[facility] Discharge Instructions for Patients Receiving Chemotherapy  Today you received the following chemotherapy agents: Nivolumab  To help prevent nausea and vomiting after your treatment, we encourage you to take your nausea medication as directed.    If you develop nausea and vomiting that is not controlled by your nausea medication, call the clinic.   BELOW ARE SYMPTOMS THAT SHOULD BE REPORTED IMMEDIATELY:  *FEVER GREATER THAN 100.5 F  *CHILLS WITH OR WITHOUT FEVER  NAUSEA AND VOMITING THAT IS NOT CONTROLLED WITH YOUR NAUSEA MEDICATION  *UNUSUAL SHORTNESS OF BREATH  *UNUSUAL BRUISING OR BLEEDING  TENDERNESS IN MOUTH AND THROAT WITH OR WITHOUT PRESENCE OF ULCERS  *URINARY PROBLEMS  *BOWEL PROBLEMS  UNUSUAL RASH Items with * indicate a potential emergency and should be followed up as soon as possible.  Feel free to call the clinic should you have any questions or concerns. The clinic phone number is [phone].  Please show the CHEMO ALERT CARD at check-in to the Emergency Department and triage nurse.   

## 2018-10-07 NOTE — Progress Notes (Signed)
[place] [phone]   [phone]  OFFICE PROGRESS NOTE  Jacqueline Maize, MD (Inactive) [place] [address]  DIAGNOSIS: stage IV(T2b, N3, M1c) non-small cell lung cancer, squamous cell carcinoma.  The patient presented with large left upper lobe lung mass in addition to left hilar and mediastinal lymphadenopathy and left true vocal cord paralysis. PDL 1 expression was 0%  PRIOR THERAPY:  1) Palliative radiotherapy to the large left upper lobe lung mass as well as metastatic brain lesions. 2)  systemic chemotherapy with carboplatin for AUC of 5 and paclitaxel 175 MG/M2 every 3 weeks. First dose on 04/15/2017.  Status post 6 cycles.  Paclitaxel has been reduced to 150 mg/M2 starting from cycle #4 secondary to peripheral neuropathy.  Last dose of chemotherapy was given July 29, 2017. 3) palliative radiotherapy to the enlarging left upper lobe lung mass.  CURRENT THERAPY: Second line treatment with immunotherapy with Nivolumab 480 mg IV every 4 weeks.  First dose of February 25, 2018.  Status post 8 cycles.  INTERVAL HISTORY: Jacqueline Kidd 78 y.o. female returns to the clinic today for follow-up visit.  The patient is feeling fine except for the generalized weakness.  She denied having any chest pain but has shortness of breath with exertion with no cough or hemoptysis.  She denied having any recent weight loss or night sweats.  She has no nausea, vomiting, diarrhea or constipation.  She continues to tolerate her treatment with nivolumab fairly well.  She is here for evaluation before starting cycle #9.  MEDICAL HISTORY: Past Medical History:  Diagnosis Date  . Chronic low back pain   . COPD (chronic obstructive pulmonary disease) ([place])   . GERD (gastroesophageal reflux disease)   . Hyperlipidemia   . Hypertension   . Lesion of left lung   . lung ca dx'd 01/2017  . Osteoporosis     ALLERGIES:  is allergic to fosamax  [alendronate]; hydrocodone-acetaminophen; and lisinopril-hydrochlorothiazide.  MEDICATIONS:  Current Outpatient Medications  Medication Sig Dispense Refill  . acetaminophen (TYLENOL) 325 MG tablet Take 650 mg by mouth every 6 (six) hours as needed for mild pain or moderate pain.     . Albuterol Sulfate 108 (90 Base) MCG/ACT AEPB Inhale 2 puffs into the lungs every 6 (six) hours as needed. 1 each 0  . cholecalciferol (VITAMIN D3) 25 MCG (1000 UT) tablet Take 1,000 Units by mouth daily.    . DULoxetine (CYMBALTA) 60 MG capsule Take 1 capsule (60 mg total) by mouth daily. (Patient not taking: Reported on 09/07/2018) 30 capsule 3  . gabapentin (NEURONTIN) 400 MG capsule Take 2 capsules (800 mg total) by mouth at bedtime. (Patient not taking: Reported on 09/07/2018) 270 capsule 3  . LORazepam (ATIVAN) 0.5 MG tablet Take 1 tablet (0.5 mg total) by mouth as needed for anxiety (take one tablet 30 minutes prior to MRI scans and radiation treatment). (Patient not taking: Reported on 09/07/2018) 10 tablet 0  . Omega-3 Fatty Acids (FISH OIL OMEGA-3 PO) Take by mouth.    . prochlorperazine (COMPAZINE) 10 MG tablet Take 10 mg by mouth every 6 (six) hours as needed for nausea or vomiting.     No current facility-administered medications for this visit.     SURGICAL HISTORY:  Past Surgical History:  Procedure Laterality Date  . ABDOMINAL HYSTERECTOMY    . TOTAL HIP ARTHROPLASTY     right    REVIEW OF SYSTEMS:  A comprehensive review of systems was negative except for: Constitutional: positive for fatigue Respiratory: positive for dyspnea on exertion Musculoskeletal: positive for muscle weakness   PHYSICAL EXAMINATION: General appearance: alert, cooperative, fatigued and no distress Head: Normocephalic, without obvious abnormality, atraumatic Neck: no adenopathy, no JVD, supple, symmetrical, trachea midline and thyroid not enlarged, symmetric, no tenderness/mass/nodules Lymph nodes: Cervical,  supraclavicular, and axillary nodes normal. Resp: clear to auscultation bilaterally Back: symmetric, no curvature. ROM normal. No CVA tenderness. Cardio: regular rate and rhythm, S1, S2 normal, no murmur, click, rub or gallop GI: soft, non-tender; bowel sounds normal; no masses,  no organomegaly Extremities: extremities normal, atraumatic, no cyanosis or edema  ECOG PERFORMANCE STATUS: 1 - Symptomatic but completely ambulatory  Blood pressure 116/75, pulse (!) 109, temperature 97.7 F (36.5 C), temperature source Oral, resp. rate 18, height 5\' 1"  (1.549 m), weight 117 lb (53.1 kg), SpO2 95 %.  LABORATORY DATA: Lab Results  Component Value Date   WBC 5.5 10/07/2018   HGB 12.0 10/07/2018   HCT 37.4 10/07/2018   MCV 104.2 (H) 10/07/2018   PLT 199 10/07/2018      Chemistry      Component Value Date/Time   NA 137 10/07/2018 1030   NA 138 07/08/2017 0831   K 3.8 10/07/2018 1030   K 3.3 (L) 07/08/2017 0831   CL 104 10/07/2018 1030   CO2 24 10/07/2018 1030   CO2 24 07/08/2017 0831   BUN 8 10/07/2018 1030   BUN 4.5 (L) 07/08/2017 0831   CREATININE 0.68 10/07/2018 1030   CREATININE 0.6 07/08/2017 0831      Component Value Date/Time   CALCIUM 8.7 (L) 10/07/2018 1030   CALCIUM 9.0 07/08/2017 0831   ALKPHOS 102 10/07/2018 1030   ALKPHOS 119 07/08/2017 0831   AST 18 10/07/2018 1030   AST 25 07/08/2017 0831   ALT 9 10/07/2018 1030   ALT 16 07/08/2017 0831   BILITOT 0.6 10/07/2018 1030   BILITOT 0.52 07/08/2017 0831       RADIOGRAPHIC STUDIES: No results found.  ASSESSMENT AND PLAN: This is a very pleasant 78 years old white female with stage IV (T2b, N3, M1 C) non-small cell lung cancer, squamous cell carcinoma presented with large left upper lobe lung mass in addition to mediastinal and supraclavicular lymphadenopathy as well as metastatic brain lesions diagnosed in September 2018. The patient underwent a course of palliative radiotherapy to the left upper lobe lung mass as  well as the metastatic brain lesions. She is currently undergoing palliative systemic chemotherapy with carboplatin for AUC of 5 and paclitaxel 175 mg/M2 every 3 weeks, status post 6 cycles. She has been in observation for several months. Repeat CT scan of the chest, abdomen and pelvis showed evidence for disease progression with enlargement of right adrenal nodule in addition to progressive enlargement of small left pleural effusion in addition to the right parietal scalp lesion.  The patient also was found to have evidence for progressive disease in the brain. She was started on second line treatment with immunotherapy with Nivolumab 480 mg IV every 4 weeks status post 8  cycles. She continues to tolerate this treatment well with no concerning adverse effects. I recommended for her to proceed with cycle #9 today as scheduled. For the depression, lack of appetite and weight loss, I will start the patient on Remeron 15 mg p.o. nightly. I will see her back for follow-up visit in 4 weeks for evaluation with repeat CT scan of the chest, abdomen  and pelvis for restaging of her disease. She was advised to call immediately if she has any concerning symptoms in the interval. The patient voices understanding of current disease status and treatment options and is in agreement with the current care plan. All questions were answered. The patient knows to call the clinic with any problems, questions or concerns. We can certainly see the patient much sooner if necessary.  Disclaimer: This note was dictated with voice recognition software. Similar sounding words can inadvertently be transcribed and may not be corrected upon review.

## 2018-10-13 ENCOUNTER — Ambulatory Visit: Payer: Self-pay | Admitting: Urology

## 2018-10-13 ENCOUNTER — Telehealth: Payer: Self-pay | Admitting: Urology

## 2018-10-13 NOTE — Telephone Encounter (Signed)
Radiation Oncology         [phone] ________________________________  Name: Jacqueline Kidd MRN: [id number]  Date: 10/13/2018  DOB: 1941-02-20  Post Treatment Note  CC: [name], MD (Inactive)  No ref. provider found  Diagnosis:   78 y.o.female withstage IV (T2b, N3, M1 C), NSCLC, squamous cell carcinoma of the left upper lung withmetastatic mediastinal lymphadenopathy and brain metastases at diagnosis,now with evidence of disease progression in the left upper lobe lung mass despite current immunotherapy.     Interval Since Last Radiation:  2 weeks  1. 09/14/2018 - 09/27/2018:  Left Upper Lobe Lung (retreat)/ 30 Gy in 10 fractions  2. 02/05/2018: Brain PTV6-8// 20 Gy in 1 fraction PTV6:AnteriorLeft Cerebellum 1mm    PTV7:Left Occipital 64mm            PTV8: Left Occipital 23mm  3. 10/22/2017:Brain PTV3-5 // 20 Gy in 1 fraction PTV3: RightTemporal60mm PTV4:LeftTemporal69mm PTV5:RightOccipital71mm  4. Chest radiation 03/23/2017 - 05/06/2017- The primary tumor and involved mediastinal adenopathy were treated to 66 Gy in 33 fractions of 2 Gy.  4. SRS on 04/03/17: Brain PTV1-2 // 20 Gy in 1 fraction. PTV1: Left cerebellar 12 mm target PTV2:Left insular 7 mm target  Narrative:  I spoke with the patient to conduct her routine scheduled 1 month follow up visit via telephone to spare the patient unnecessary potential exposure in the healthcare setting during the current COVID-19 pandemic.  The patient was notified in advance and gave permission to proceed with this visit format. She tolerated radiation treatment relatively well. She reported a productive cough with occasional scant hemoptysis, occasional difficulty swallowing, hoarseness, intermittent episodes of headache, nausea, and vomiting, and easily becoming short of breath. She denied pain, diplopia, change in appetite, difficulty sleeping, and recent falls.                              On review of  systems, the patient states that she is doing fair overall.  She continues with rare episodes of scant hemoptysis but reports that her dysphagia has completely resolved.  The sputum is only faint pink in coloration.  She denies any increased shortness of breath, chest pain or productive cough.  She has not had recent fever, chills or night sweats.  She continues with a decreased appetite but is eating to maintain her weight.  She denies any recent headaches, dizziness/imbalance, nausea/vomiting, tremors or seizures.  She continues with chronic chemotherapy induced neuropathy in her hands and lower extremities-unchanged recently.  ALLERGIES:  is allergic to fosamax [alendronate]; hydrocodone-acetaminophen; and lisinopril-hydrochlorothiazide.  Meds: Current Outpatient Medications  Medication Sig Dispense Refill  . acetaminophen (TYLENOL) 325 MG tablet Take 650 mg by mouth every 6 (six) hours as needed for mild pain or moderate pain.     . Albuterol Sulfate 108 (90 Base) MCG/ACT AEPB Inhale 2 puffs into the lungs every 6 (six) hours as needed. 1 each 0  . cholecalciferol (VITAMIN D3) 25 MCG (1000 UT) tablet Take 1,000 Units by mouth daily.    . DULoxetine (CYMBALTA) 60 MG capsule Take 1 capsule (60 mg total) by mouth daily. (Patient not taking: Reported on 09/07/2018) 30 capsule 3  . gabapentin (NEURONTIN) 400 MG capsule Take 2 capsules (800 mg total) by mouth at bedtime. (Patient not taking: Reported on 09/07/2018) 270 capsule 3  . LORazepam (ATIVAN) 0.5 MG tablet Take 1 tablet (0.5 mg total) by mouth as needed for anxiety (take  one tablet 30 minutes prior to MRI scans and radiation treatment). (Patient not taking: Reported on 09/07/2018) 10 tablet 0  . Omega-3 Fatty Acids (FISH OIL OMEGA-3 PO) Take by mouth.    . prochlorperazine (COMPAZINE) 10 MG tablet Take 10 mg by mouth every 6 (six) hours as needed for nausea or vomiting.     No current facility-administered medications for this visit.     Physical  Findings: Unable to perform due to telephone visit format.  Lab Findings: Lab Results  Component Value Date   WBC 5.5 10/07/2018   HGB 12.0 10/07/2018   HCT 37.4 10/07/2018   MCV 104.2 (H) 10/07/2018   PLT 199 10/07/2018     Radiographic Findings: No results found.  Impression/Plan: 1. 78 y.o.female withstage IV (T2b, N3, M1 C), NSCLC, squamous cell carcinoma of the left upper lung withmetastatic mediastinal lymphadenopathy and brain metastases at diagnosis,now with evidence of disease progression in the left upper lobe lung mass despite current immunotherapy.    She appears to have recovered well from her recent radiotherapy.  We discussed that while we are happy to continue to participate in her care if clinically indicated, we will plan to see her back on an as-needed basis.  She will continue in routine follow-up under the care and direction of Dr. Earlie Server and has a scheduled follow-up visit with him on 11/04/2018 with plans to have a repeat CT chest prior to that visit.  The current plan is to continue on her second line treatment with immunotherapy with nivolumab every 4 weeks, recently completed cycle #9 on 10/07/2018.  She appears to have a good understanding of our recommendations and is in agreement with the stated plan. 2. Brain metastases secondary to stage IV (T2b, N3, M1 C), NSCLC, squamous cell carcinoma of the left upper lung.  She will continue in routine follow-up with Dr. Mickeal Skinner.  Her most recent brain MRI from 07/23/2018 was suggestive of inflammation from delayed effect of radiation combined with systemic immunotherapy.  She is scheduled for a repeat MRI brain on 10/22/2018 and a follow-up with Dr. Mickeal Skinner thereafter on 10/26/2018 to continue to monitor closely.    Jacqueline Johns, PA-C

## 2018-10-15 ENCOUNTER — Other Ambulatory Visit: Payer: Self-pay | Admitting: Radiation Therapy

## 2018-10-22 ENCOUNTER — Ambulatory Visit
Admission: RE | Admit: 2018-10-22 | Discharge: 2018-10-22 | Disposition: A | Discharge status: Home / Self Care | Payer: Medicare Other | Source: Ambulatory Visit | Attending: Internal Medicine | Admitting: Internal Medicine

## 2018-10-22 ENCOUNTER — Other Ambulatory Visit: Payer: Self-pay

## 2018-10-22 ENCOUNTER — Encounter: Payer: Self-pay | Admitting: Internal Medicine

## 2018-10-22 DIAGNOSIS — C7931 Secondary malignant neoplasm of brain: Secondary | ICD-10-CM

## 2018-10-22 MED ORDER — GADOBENATE DIMEGLUMINE 529 MG/ML IV SOLN
10.0000 mL | Freq: Once | INTRAVENOUS | Status: AC | PRN
  Administered 2018-10-22: 10 mL via INTRAVENOUS

## 2018-10-24 ENCOUNTER — Encounter: Payer: Self-pay | Admitting: Internal Medicine

## 2018-10-26 ENCOUNTER — Inpatient Hospital Stay (HOSPITAL_BASED_OUTPATIENT_CLINIC_OR_DEPARTMENT_OTHER): Payer: Medicare Other | Admitting: Internal Medicine

## 2018-10-26 DIAGNOSIS — C7931 Secondary malignant neoplasm of brain: Secondary | ICD-10-CM

## 2018-10-26 DIAGNOSIS — C719 Malignant neoplasm of brain, unspecified: Secondary | ICD-10-CM

## 2018-10-26 DIAGNOSIS — Z923 Personal history of irradiation: Secondary | ICD-10-CM

## 2018-10-26 NOTE — Progress Notes (Signed)
I connected with Jacqueline Kidd on 10/26/18 at 11:30 AM EDT by telephone visit and verified that I am speaking with the correct person using two identifiers.  I discussed the limitations, risks, security and privacy concerns of performing an evaluation and management service by telemedicine and the availability of in-person appointments. I also discussed with the patient that there may be a patient responsible charge related to this service. The patient expressed understanding and agreed to proceed.  Other persons participating in the visit and their role in the encounter:  n/a   Patient's location:  Home   Provider's location:  Office  Chief Complaint:  Brain Metastases  History of Present Ilness: Denies new or progressive neurologic symptoms.  No seizures or headaches Observations: Cognition and memory as prior.  No dysarthria Assessment and Plan: 3 of 9 treated metastases demonstrate interval growth, likely etiology is post-RT inflammation Follow Up Instructions: Repeat Brain MRI and follow up in 3 months or sooner as needed.  I discussed the assessment and treatment plan with the patient.  The patient was provided an opportunity to ask questions and all were answered.  The patient agreed with the plan and demonstrated understanding of the instructions.    The patient was advised to call back or seek an in-person evaluation if the symptoms worsen or if the condition fails to improve as anticipated.  I provided 5-10 minutes of non-face-to-face time during this enocunter.  Ventura Sellers, MD   I provided 10 minutes of non face-to-face telephone visit time during this encounter, and > 50% was spent counseling as documented under my assessment & plan.

## 2018-10-27 ENCOUNTER — Ambulatory Visit: Payer: Medicare Other | Admitting: Oncology

## 2018-10-27 ENCOUNTER — Telehealth: Payer: Self-pay | Admitting: Internal Medicine

## 2018-10-27 NOTE — Telephone Encounter (Signed)
Scheduled appt per 4/21 los. °

## 2018-11-02 ENCOUNTER — Ambulatory Visit
Admission: RE | Admit: 2018-11-02 | Discharge: 2018-11-02 | Disposition: A | Discharge status: Home / Self Care | Payer: Medicare Other | Source: Ambulatory Visit | Attending: Internal Medicine | Admitting: Internal Medicine

## 2018-11-02 ENCOUNTER — Other Ambulatory Visit: Payer: Self-pay | Admitting: *Deleted

## 2018-11-02 ENCOUNTER — Other Ambulatory Visit: Payer: Self-pay

## 2018-11-02 DIAGNOSIS — C3490 Malignant neoplasm of unspecified part of unspecified bronchus or lung: Secondary | ICD-10-CM | POA: Diagnosis not present

## 2018-11-02 DIAGNOSIS — C719 Malignant neoplasm of brain, unspecified: Secondary | ICD-10-CM

## 2018-11-02 MED ORDER — IOPAMIDOL (ISOVUE-300) INJECTION 61%
100.0000 mL | Freq: Once | INTRAVENOUS | Status: AC | PRN
  Administered 2018-11-02: 100 mL via INTRAVENOUS

## 2018-11-04 ENCOUNTER — Encounter: Payer: Self-pay | Admitting: Internal Medicine

## 2018-11-04 ENCOUNTER — Inpatient Hospital Stay (HOSPITAL_BASED_OUTPATIENT_CLINIC_OR_DEPARTMENT_OTHER): Payer: Medicare Other | Admitting: Internal Medicine

## 2018-11-04 ENCOUNTER — Telehealth: Payer: Self-pay | Admitting: Internal Medicine

## 2018-11-04 ENCOUNTER — Inpatient Hospital Stay: Payer: Medicare Other

## 2018-11-04 ENCOUNTER — Other Ambulatory Visit: Payer: Self-pay

## 2018-11-04 ENCOUNTER — Ambulatory Visit: Payer: Self-pay | Admitting: Urology

## 2018-11-04 VITALS — BP 125/88 | HR 106 | Temp 97.6°F | Resp 18 | Ht 61.0 in | Wt 115.4 lb

## 2018-11-04 VITALS — HR 99

## 2018-11-04 DIAGNOSIS — E279 Disorder of adrenal gland, unspecified: Secondary | ICD-10-CM | POA: Diagnosis not present

## 2018-11-04 DIAGNOSIS — R0609 Other forms of dyspnea: Secondary | ICD-10-CM

## 2018-11-04 DIAGNOSIS — L989 Disorder of the skin and subcutaneous tissue, unspecified: Secondary | ICD-10-CM | POA: Diagnosis not present

## 2018-11-04 DIAGNOSIS — Z79899 Other long term (current) drug therapy: Secondary | ICD-10-CM | POA: Diagnosis not present

## 2018-11-04 DIAGNOSIS — J90 Pleural effusion, not elsewhere classified: Secondary | ICD-10-CM | POA: Diagnosis not present

## 2018-11-04 DIAGNOSIS — C3412 Malignant neoplasm of upper lobe, left bronchus or lung: Secondary | ICD-10-CM

## 2018-11-04 DIAGNOSIS — R630 Anorexia: Secondary | ICD-10-CM

## 2018-11-04 DIAGNOSIS — R634 Abnormal weight loss: Secondary | ICD-10-CM

## 2018-11-04 DIAGNOSIS — Z5112 Encounter for antineoplastic immunotherapy: Secondary | ICD-10-CM | POA: Diagnosis not present

## 2018-11-04 DIAGNOSIS — C7931 Secondary malignant neoplasm of brain: Secondary | ICD-10-CM | POA: Diagnosis not present

## 2018-11-04 DIAGNOSIS — R5382 Chronic fatigue, unspecified: Secondary | ICD-10-CM

## 2018-11-04 DIAGNOSIS — F329 Major depressive disorder, single episode, unspecified: Secondary | ICD-10-CM

## 2018-11-04 DIAGNOSIS — R5383 Other fatigue: Secondary | ICD-10-CM

## 2018-11-04 LAB — CBC WITH DIFFERENTIAL (CANCER CENTER ONLY)
Abs Immature Granulocytes: 0.02 10*3/uL (ref 0.00–0.07)
Basophils Absolute: 0 10*3/uL (ref 0.0–0.1)
Basophils Relative: 1 %
Eosinophils Absolute: 0.1 10*3/uL (ref 0.0–0.5)
Eosinophils Relative: 2 %
HCT: 37.5 % (ref 36.0–46.0)
Hemoglobin: 12 g/dL (ref 12.0–15.0)
Immature Granulocytes: 0 %
Lymphocytes Relative: 18 %
Lymphs Abs: 1 10*3/uL (ref 0.7–4.0)
MCH: 33.3 pg (ref 26.0–34.0)
MCHC: 32 g/dL (ref 30.0–36.0)
MCV: 104.2 fL — ABNORMAL HIGH (ref 80.0–100.0)
Monocytes Absolute: 0.6 10*3/uL (ref 0.1–1.0)
Monocytes Relative: 11 %
Neutro Abs: 3.8 10*3/uL (ref 1.7–7.7)
Neutrophils Relative %: 68 %
Platelet Count: 168 10*3/uL (ref 150–400)
RBC: 3.6 MIL/uL — ABNORMAL LOW (ref 3.87–5.11)
RDW: 13.3 % (ref 11.5–15.5)
WBC Count: 5.5 10*3/uL (ref 4.0–10.5)
nRBC: 0 % (ref 0.0–0.2)

## 2018-11-04 LAB — CMP (CANCER CENTER ONLY)
ALT: 7 U/L (ref 0–44)
AST: 15 U/L (ref 15–41)
Albumin: 3 g/dL — ABNORMAL LOW (ref 3.5–5.0)
Alkaline Phosphatase: 94 U/L (ref 38–126)
Anion gap: 9 (ref 5–15)
BUN: 6 mg/dL — ABNORMAL LOW (ref 8–23)
CO2: 25 mmol/L (ref 22–32)
Calcium: 8.8 mg/dL — ABNORMAL LOW (ref 8.9–10.3)
Chloride: 104 mmol/L (ref 98–111)
Creatinine: 0.7 mg/dL (ref 0.44–1.00)
GFR, Est AFR Am: >60 mL/min (ref >60)
GFR, Estimated: >60 mL/min (ref >60)
Glucose, Bld: 156 mg/dL — ABNORMAL HIGH (ref 70–99)
Potassium: 3.8 mmol/L (ref 3.5–5.1)
Sodium: 138 mmol/L (ref 135–145)
Total Bilirubin: 0.6 mg/dL (ref 0.3–1.2)
Total Protein: 7.3 g/dL (ref 6.5–8.1)

## 2018-11-04 LAB — TSH: TSH: 1.435 u[IU]/mL (ref 0.308–3.960)

## 2018-11-04 MED ORDER — SODIUM CHLORIDE 0.9 % IV SOLN
Freq: Once | INTRAVENOUS | Status: AC
  Administered 2018-11-04: 11:00:00 via INTRAVENOUS
  Filled 2018-11-04: qty 250

## 2018-11-04 MED ORDER — SODIUM CHLORIDE 0.9 % IV SOLN
480.0000 mg | Freq: Once | INTRAVENOUS | Status: AC
  Administered 2018-11-04: 11:00:00 480 mg via INTRAVENOUS
  Filled 2018-11-04: qty 48

## 2018-11-04 NOTE — Progress Notes (Signed)
[facility] [phone]   Fax:([phone]) [place], [place] [address]  DIAGNOSIS: stage IV(T2b, N3, M1c) non-small cell lung cancer, squamous cell carcinoma.  The patient presented with large left upper lobe lung mass in addition to left hilar and mediastinal lymphadenopathy and left true vocal cord paralysis. PDL 1 expression was 0%  PRIOR THERAPY:  1) Palliative radiotherapy to the large left upper lobe lung mass as well as metastatic brain lesions. 2)  systemic chemotherapy with carboplatin for AUC of 5 and paclitaxel 175 MG/M2 every 3 weeks. First dose on 04/15/2017.  Status post 6 cycles.  Paclitaxel has been reduced to 150 mg/M2 starting from cycle #4 secondary to peripheral neuropathy.  Last dose of chemotherapy was given July 29, 2017. 3) palliative radiotherapy to the enlarging left upper lobe lung mass.  CURRENT THERAPY: Second line treatment with immunotherapy with Nivolumab 480 mg IV every 4 weeks.  First dose of February 25, 2018.  Status post 9 cycles.  INTERVAL HISTORY: Jacqueline Kidd 78 y.o. female returns to the clinic today for follow-up visit.  The patient is feeling fine today with no concerning complaints except for fatigue and shortness of breath with exertion.  She denied having any chest pain, cough or hemoptysis.  She denied having any recent weight loss or night sweats.  She has no nausea, vomiting, diarrhea or constipation.  She has no headache or visual changes.  She continues to tolerate her treatment with immunotherapy fairly well except for occasional nausea and diarrhea but not on daily basis.  The patient had repeat CT scan of the chest, abdomen and pelvis performed recently and she is here for evaluation and discussion of her scan results.   MEDICAL HISTORY: Past Medical History:  Diagnosis Date   Chronic low back pain    COPD (chronic obstructive pulmonary  disease) (HCC)    GERD (gastroesophageal reflux disease)    Hyperlipidemia    Hypertension    Lesion of left lung    lung ca dx'd 01/2017   Osteoporosis     ALLERGIES:  is allergic to fosamax [alendronate]; hydrocodone-acetaminophen; and lisinopril-hydrochlorothiazide.  MEDICATIONS:  Current Outpatient Medications  Medication Sig Dispense Refill   acetaminophen (TYLENOL) 325 MG tablet Take 650 mg by mouth every 6 (six) hours as needed for mild pain or moderate pain.      Albuterol Sulfate 108 (90 Base) MCG/ACT AEPB Inhale 2 puffs into the lungs every 6 (six) hours as needed. 1 each 0   cholecalciferol (VITAMIN D3) 25 MCG (1000 UT) tablet Take 1,000 Units by mouth daily.     DULoxetine (CYMBALTA) 60 MG capsule Take 1 capsule (60 mg total) by mouth daily. (Patient not taking: Reported on 09/07/2018) 30 capsule 3   gabapentin (NEURONTIN) 400 MG capsule Take 2 capsules (800 mg total) by mouth at bedtime. (Patient not taking: Reported on 09/07/2018) 270 capsule 3   LORazepam (ATIVAN) 0.5 MG tablet Take 1 tablet (0.5 mg total) by mouth as needed for anxiety (take one tablet 30 minutes prior to MRI scans and radiation treatment). (Patient not taking: Reported on 09/07/2018) 10 tablet 0   Omega-3 Fatty Acids (FISH OIL OMEGA-3 PO) Take by mouth.     prochlorperazine (COMPAZINE) 10 MG tablet Take 10 mg by mouth every 6 (six) hours as needed for nausea or vomiting.     No current facility-administered medications for this visit.  SURGICAL HISTORY:  Past Surgical History:  Procedure Laterality Date   ABDOMINAL HYSTERECTOMY     TOTAL HIP ARTHROPLASTY     right    REVIEW OF SYSTEMS:  Constitutional: positive for fatigue Eyes: negative Ears, nose, mouth, throat, and face: negative Respiratory: positive for dyspnea on exertion Cardiovascular: negative Gastrointestinal: negative Genitourinary:negative Integument/breast: negative Hematologic/lymphatic:  negative Musculoskeletal:negative Neurological: negative Behavioral/Psych: negative Endocrine: negative Allergic/Immunologic: negative   PHYSICAL EXAMINATION: General appearance: alert, cooperative, fatigued and no distress Head: Normocephalic, without obvious abnormality, atraumatic Neck: no adenopathy, no JVD, supple, symmetrical, trachea midline and thyroid not enlarged, symmetric, no tenderness/mass/nodules Lymph nodes: Cervical, supraclavicular, and axillary nodes normal. Resp: clear to auscultation bilaterally Back: symmetric, no curvature. ROM normal. No CVA tenderness. Cardio: regular rate and rhythm, S1, S2 normal, no murmur, click, rub or gallop GI: soft, non-tender; bowel sounds normal; no masses,  no organomegaly Extremities: extremities normal, atraumatic, no cyanosis or edema Neurologic: Alert and oriented X 3, normal strength and tone. Normal symmetric reflexes. Normal coordination and gait  ECOG PERFORMANCE STATUS: 1 - Symptomatic but completely ambulatory  Blood pressure 125/88, pulse (!) 106, temperature 97.6 F (36.4 C), temperature source Oral, resp. rate 18, height 5\' 1"  (1.549 m), weight 115 lb 6.4 oz (52.3 kg), SpO2 95 %.  LABORATORY DATA: Lab Results  Component Value Date   WBC 5.5 11/04/2018   HGB 12.0 11/04/2018   HCT 37.5 11/04/2018   MCV 104.2 (H) 11/04/2018   PLT 168 11/04/2018      Chemistry      Component Value Date/Time   NA 137 10/07/2018 1030   NA 138 07/08/2017 0831   K 3.8 10/07/2018 1030   K 3.3 (L) 07/08/2017 0831   CL 104 10/07/2018 1030   CO2 24 10/07/2018 1030   CO2 24 07/08/2017 0831   BUN 8 10/07/2018 1030   BUN 4.5 (L) 07/08/2017 0831   CREATININE 0.68 10/07/2018 1030   CREATININE 0.6 07/08/2017 0831      Component Value Date/Time   CALCIUM 8.7 (L) 10/07/2018 1030   CALCIUM 9.0 07/08/2017 0831   ALKPHOS 102 10/07/2018 1030   ALKPHOS 119 07/08/2017 0831   AST 18 10/07/2018 1030   AST 25 07/08/2017 0831   ALT 9  10/07/2018 1030   ALT 16 07/08/2017 0831   BILITOT 0.6 10/07/2018 1030   BILITOT 0.52 07/08/2017 0831       RADIOGRAPHIC STUDIES: Ct Chest W Contrast  Result Date: 11/02/2018 CLINICAL DATA:  Non-small-cell lung cancer. EXAM: CT CHEST, ABDOMEN, AND PELVIS WITH CONTRAST TECHNIQUE: Multidetector CT imaging of the chest, abdomen and pelvis was performed following the standard protocol during bolus administration of intravenous contrast. CONTRAST:  150mL ISOVUE-300 IOPAMIDOL (ISOVUE-300) INJECTION 61% COMPARISON:  08/10/2018 FINDINGS: CT CHEST FINDINGS Cardiovascular: The heart size is normal. Small pericardial effusion has not changed substantially in the interval. Aberrant origin right subclavian artery again noted. Coronary artery calcification is evident. Atherosclerotic calcification is noted in the wall of the thoracic aorta. Mediastinum/Nodes: No mediastinal lymphadenopathy. No right hilar lymphadenopathy. Similar appearance of left upper lobe collapse into the superior left hilum with post treatment changes in the mid and lower left hilum. Lungs/Pleura: Low-density lesion measured previously in the left upper lobe at 2.1 x 1.8 cm now measures 2.1 x 1.6 cm when measuring at the same level. Left upper lobe collapse persists. Centrilobular and paraseptal emphysema evident. Bilateral diffuse subpleural reticulation is similar to prior. No new suspicious pulmonary nodule or mass. Small to moderate left  pleural effusion is slightly progressed in the interval. Musculoskeletal: Bones are diffusely demineralized. No worrisome lytic or sclerotic osseous abnormality. CT ABDOMEN PELVIS FINDINGS Hepatobiliary: No suspicious focal abnormality within the liver parenchyma. Tiny calcified gallstones evident. No intrahepatic or extrahepatic biliary dilation. Pancreas: No focal mass lesion. No dilatation of the main duct. No intraparenchymal cyst. No peripancreatic edema. Spleen: Calcified granulomata. Adrenals/Urinary  Tract: No adrenal nodule or mass. Kidneys unremarkable. No evidence for hydroureter. Bladder partially obscured by beam hardening artifact from right hip replacement. Stomach/Bowel: Stomach is unremarkable. No gastric wall thickening. No evidence of outlet obstruction. Duodenum is normally positioned as is the ligament of Treitz. No small bowel wall thickening. No small bowel dilatation. The terminal ileum is normal. No gross colonic mass. No colonic wall thickening. Vascular/Lymphatic: There is abdominal aortic atherosclerosis without aneurysm. There is no gastrohepatic or hepatoduodenal ligament lymphadenopathy. No intraperitoneal or retroperitoneal lymphadenopathy. No pelvic sidewall lymphadenopathy. Reproductive: The uterus is surgically absent. There is no adnexal mass. Other: No intraperitoneal free fluid. Musculoskeletal: Bones are diffusely demineralized. Status post right hip replacement. No worrisome lytic or sclerotic osseous abnormality. Stable appearance of compression deformity involving all lumbar vertebral bodies. IMPRESSION: 1. Similar appearance of the left upper lobe lesion with persistent left upper lobe collapse. 2. Small to moderate left pleural effusion shows slight interval progression. 3. No evidence for metastatic disease in the abdomen or pelvis. 4. Cholelithiasis. 5.  Aortic Atherosclerois (ICD10-170.0) 6.  Emphysema. ([id number]) Electronically Signed   By: Misty Stanley M.D.   On: 11/02/2018 15:58   Mr [name] VO Contrast  Result Date: 10/22/2018 CLINICAL DATA:  Metastatic lung cancer. Post stereotactic radio surgery. EXAM: MRI HEAD WITHOUT AND WITH CONTRAST TECHNIQUE: Multiplanar, multiecho pulse sequences of the brain and surrounding structures were obtained without and with intravenous contrast. CONTRAST:  7mL MULTIHANCE GADOBENATE DIMEGLUMINE 529 MG/ML IV SOLN COMPARISON:  MRI head 07/23/2018 FINDINGS: BRAIN New Lesions: None. Larger lesions: 11 mm ring-enhancing lesion left  occipital lobe has enlarged in the interval with mild edema. Series 12 axial image 51 6.2 mm right occipital periventricular enhancing lesion slightly larger. 12:61 3 mm right medial occipital lesion has enlarged.  12:81 Stable or Smaller lesions: 10 x 6 mm ring-enhancing lesion right temporal lobe stable.  12:59 6 mm left temporal lesion stable.  12:69 Left posterior insular lesion with patchy enhancement and surrounding edema stable. 12:83 5 mm lesion left posterior cerebellum stable.  12:25 Other Brain findings: Small areas of restricted diffusion on the prior study in the left frontal lobe and right occipital lobe have resolved compatible with infarct. New areas of restricted diffusion in the left periventricular white matter, left cerebellum, right posterior temporal lobe, and right frontal white matter have developed since the prior study compatible interval acute infarct. Moderate ventricular enlargement is stable. Chronic lacunar infarcts in the left thalamus and left lateral basal ganglia unchanged. Periventricular white matter hyperintensity with mild progression in the interval. Vascular: Normal arterial flow voids Skull and upper cervical spine: No suspicious skeletal lesions. Sinuses/Orbits: Negative Other: None IMPRESSION: 1. Mild progression of 3 known metastatic deposits. No change in 4 additional metastatic deposits. 2. Resolution of small infarct since the prior study with interval development of several small acute infarcts which were not present previously and do not enhance. 3. Diffuse periventricular white matter disease with mild progression. Ventricular enlargement stable. Electronically Signed   By: Franchot Gallo M.D.   On: 10/22/2018 16:29   Ct Abdomen Pelvis W Contrast  Result Date:  11/02/2018 CLINICAL DATA:  Non-small-cell lung cancer. EXAM: CT CHEST, ABDOMEN, AND PELVIS WITH CONTRAST TECHNIQUE: Multidetector CT imaging of the chest, abdomen and pelvis was performed following the  standard protocol during bolus administration of intravenous contrast. CONTRAST:  143mL ISOVUE-300 IOPAMIDOL (ISOVUE-300) INJECTION 61% COMPARISON:  08/10/2018 FINDINGS: CT CHEST FINDINGS Cardiovascular: The heart size is normal. Small pericardial effusion has not changed substantially in the interval. Aberrant origin right subclavian artery again noted. Coronary artery calcification is evident. Atherosclerotic calcification is noted in the wall of the thoracic aorta. Mediastinum/Nodes: No mediastinal lymphadenopathy. No right hilar lymphadenopathy. Similar appearance of left upper lobe collapse into the superior left hilum with post treatment changes in the mid and lower left hilum. Lungs/Pleura: Low-density lesion measured previously in the left upper lobe at 2.1 x 1.8 cm now measures 2.1 x 1.6 cm when measuring at the same level. Left upper lobe collapse persists. Centrilobular and paraseptal emphysema evident. Bilateral diffuse subpleural reticulation is similar to prior. No new suspicious pulmonary nodule or mass. Small to moderate left pleural effusion is slightly progressed in the interval. Musculoskeletal: Bones are diffusely demineralized. No worrisome lytic or sclerotic osseous abnormality. CT ABDOMEN PELVIS FINDINGS Hepatobiliary: No suspicious focal abnormality within the liver parenchyma. Tiny calcified gallstones evident. No intrahepatic or extrahepatic biliary dilation. Pancreas: No focal mass lesion. No dilatation of the main duct. No intraparenchymal cyst. No peripancreatic edema. Spleen: Calcified granulomata. Adrenals/Urinary Tract: No adrenal nodule or mass. Kidneys unremarkable. No evidence for hydroureter. Bladder partially obscured by beam hardening artifact from right hip replacement. Stomach/Bowel: Stomach is unremarkable. No gastric wall thickening. No evidence of outlet obstruction. Duodenum is normally positioned as is the ligament of Treitz. No small bowel wall thickening. No small bowel  dilatation. The terminal ileum is normal. No gross colonic mass. No colonic wall thickening. Vascular/Lymphatic: There is abdominal aortic atherosclerosis without aneurysm. There is no gastrohepatic or hepatoduodenal ligament lymphadenopathy. No intraperitoneal or retroperitoneal lymphadenopathy. No pelvic sidewall lymphadenopathy. Reproductive: The uterus is surgically absent. There is no adnexal mass. Other: No intraperitoneal free fluid. Musculoskeletal: Bones are diffusely demineralized. Status post right hip replacement. No worrisome lytic or sclerotic osseous abnormality. Stable appearance of compression deformity involving all lumbar vertebral bodies. IMPRESSION: 1. Similar appearance of the left upper lobe lesion with persistent left upper lobe collapse. 2. Small to moderate left pleural effusion shows slight interval progression. 3. No evidence for metastatic disease in the abdomen or pelvis. 4. Cholelithiasis. 5.  Aortic Atherosclerois (ICD10-170.0) 6.  Emphysema. ([id number]) Electronically Signed   By: Misty Stanley M.D.   On: 11/02/2018 15:58    ASSESSMENT AND PLAN: This is a very pleasant 78 years old white female with stage IV (T2b, N3, M1 C) non-small cell lung cancer, squamous cell carcinoma presented with large left upper lobe lung mass in addition to mediastinal and supraclavicular lymphadenopathy as well as metastatic brain lesions diagnosed in September 2018. The patient underwent a course of palliative radiotherapy to the left upper lobe lung mass as well as the metastatic brain lesions. She is currently undergoing palliative systemic chemotherapy with carboplatin for AUC of 5 and paclitaxel 175 mg/M2 every 3 weeks, status post 6 cycles. She has been in observation for several months. Repeat CT scan of the chest, abdomen and pelvis showed evidence for disease progression with enlargement of right adrenal nodule in addition to progressive enlargement of small left pleural effusion in  addition to the right parietal scalp lesion.  The patient also was found to have evidence  for progressive disease in the brain. She was started on second line treatment with immunotherapy with Nivolumab 480 mg IV every 4 weeks status post 9  cycles. The patient continues to tolerate this treatment well with no concerning adverse effects. She had repeat CT scan of the chest, abdomen and pelvis performed recently.  I personally and independently reviewed the scans and discussed the results with the patient today. Her scan showed no concerning findings for disease progression. I recommended for the patient to continue her current treatment with nivolumab and she will proceed with cycle #scheduled. For the depression, lack of appetite and weight loss, I will start the patient on Remeron 15 mg p.o. nightly. She was advised to call immediately if she has any concerning symptoms in the interval. The patient voices understanding of current disease status and treatment options and is in agreement with the current care plan. All questions were answered. The patient knows to call the clinic with any problems, questions or concerns. We can certainly see the patient much sooner if necessary.  Disclaimer: This note was dictated with voice recognition software. Similar sounding words can inadvertently be transcribed and may not be corrected upon review.

## 2018-11-04 NOTE — Telephone Encounter (Signed)
Scheduled appt per 4/30 los - pt to get an updated schedule next visit. Added an additional cycle .  \

## 2018-11-04 NOTE — Patient Instructions (Signed)
[facility] Discharge Instructions for Patients Receiving Chemotherapy  Today you received the following chemotherapy agents Nivolumab (OPDIVO).  To help prevent nausea and vomiting after your treatment, we encourage you to take your nausea medication as prescribed.   If you develop nausea and vomiting that is not controlled by your nausea medication, call the clinic.   BELOW ARE SYMPTOMS THAT SHOULD BE REPORTED IMMEDIATELY:  *FEVER GREATER THAN 100.5 F  *CHILLS WITH OR WITHOUT FEVER  NAUSEA AND VOMITING THAT IS NOT CONTROLLED WITH YOUR NAUSEA MEDICATION  *UNUSUAL SHORTNESS OF BREATH  *UNUSUAL BRUISING OR BLEEDING  TENDERNESS IN MOUTH AND THROAT WITH OR WITHOUT PRESENCE OF ULCERS  *URINARY PROBLEMS  *BOWEL PROBLEMS  UNUSUAL RASH Items with * indicate a potential emergency and should be followed up as soon as possible.  Feel free to call the clinic should you have any questions or concerns. The clinic phone number is [phone].  Please show the [facility] at check-in to the Emergency Department and triage nurse.  Coronavirus (COVID-19) Are you at risk?  Are you at risk for the Coronavirus (COVID-19)?  To be considered HIGH RISK for Coronavirus (COVID-19), you have to meet the following criteria:  . Traveled to [place], [place], [place], [place] or [place]; or in the [place] to [place], [place], [place], or [place]; and have fever, cough, and shortness of breath within the last 2 weeks of travel OR . Been in close contact with a person diagnosed with COVID-19 within the last 2 weeks and have fever, cough, and shortness of breath . IF YOU DO NOT MEET THESE CRITERIA, YOU ARE CONSIDERED LOW RISK FOR COVID-19.  What to do if you are HIGH RISK for COVID-19?  Marland Kitchen If you are having a medical emergency, call 911. . Seek medical care right away. Before you go to a doctor's office, urgent care or emergency department, call ahead and tell them  about your recent travel, contact with someone diagnosed with COVID-19, and your symptoms. You should receive instructions from your physician's office regarding next steps of care.  . When you arrive at healthcare provider, tell the healthcare staff immediately you have returned from visiting [place], [place], [place], [place] or [place]; or traveled in the [place] to [place], [place], [place], or [place]; in the last two weeks or you have been in close contact with a person diagnosed with COVID-19 in the last 2 weeks.   . Tell the health care staff about your symptoms: fever, cough and shortness of breath. . After you have been seen by a medical provider, you will be either: o Tested for (COVID-19) and discharged home on quarantine except to seek medical care if symptoms worsen, and asked to  - Stay home and avoid contact with others until you get your results (4-5 days)  - Avoid travel on public transportation if possible (such as bus, train, or airplane) or o Sent to the Emergency Department by EMS for evaluation, COVID-19 testing, and possible admission depending on your condition and test results.  What to do if you are LOW RISK for COVID-19?  Reduce your risk of any infection by using the same precautions used for avoiding the common cold or flu:  Marland Kitchen Wash your hands often with soap and warm water for at least 20 seconds.  If soap and water are not readily available, use an alcohol-based hand sanitizer with at least 60% alcohol.  . If coughing or  sneezing, cover your mouth and nose by coughing or sneezing into the elbow areas of your shirt or coat, into a tissue or into your sleeve (not your hands). . Avoid shaking hands with others and consider head nods or verbal greetings only. . Avoid touching your eyes, nose, or mouth with unwashed hands.  . Avoid close contact with people who are sick. . Avoid places or events with large numbers of people in one location, like concerts or  sporting events. . Carefully consider travel plans you have or are making. . If you are planning any travel outside or inside the [place], visit the CDC's Travelers' Health webpage for the latest health notices. . If you have some symptoms but not all symptoms, continue to monitor at home and seek medical attention if your symptoms worsen. . If you are having a medical emergency, call 911.   [place] / e-Visit: [email or web address]         [facility]: [facility]: [phone]                   [facility]: [phone]

## 2018-12-02 ENCOUNTER — Inpatient Hospital Stay: Payer: Medicare Other

## 2018-12-02 ENCOUNTER — Encounter: Payer: Self-pay | Admitting: Internal Medicine

## 2018-12-02 ENCOUNTER — Inpatient Hospital Stay (HOSPITAL_BASED_OUTPATIENT_CLINIC_OR_DEPARTMENT_OTHER): Payer: Medicare Other | Admitting: Internal Medicine

## 2018-12-02 ENCOUNTER — Inpatient Hospital Stay: Payer: Medicare Other | Attending: Internal Medicine

## 2018-12-02 ENCOUNTER — Other Ambulatory Visit: Payer: Self-pay

## 2018-12-02 VITALS — BP 118/80 | HR 114 | Temp 98.9°F | Resp 18 | Ht 61.0 in | Wt 115.0 lb

## 2018-12-02 VITALS — HR 98

## 2018-12-02 DIAGNOSIS — Z5112 Encounter for antineoplastic immunotherapy: Secondary | ICD-10-CM

## 2018-12-02 DIAGNOSIS — C7931 Secondary malignant neoplasm of brain: Secondary | ICD-10-CM

## 2018-12-02 DIAGNOSIS — R634 Abnormal weight loss: Secondary | ICD-10-CM

## 2018-12-02 DIAGNOSIS — F329 Major depressive disorder, single episode, unspecified: Secondary | ICD-10-CM

## 2018-12-02 DIAGNOSIS — C3412 Malignant neoplasm of upper lobe, left bronchus or lung: Secondary | ICD-10-CM | POA: Insufficient documentation

## 2018-12-02 DIAGNOSIS — K219 Gastro-esophageal reflux disease without esophagitis: Secondary | ICD-10-CM

## 2018-12-02 DIAGNOSIS — E279 Disorder of adrenal gland, unspecified: Secondary | ICD-10-CM

## 2018-12-02 DIAGNOSIS — R5382 Chronic fatigue, unspecified: Secondary | ICD-10-CM

## 2018-12-02 DIAGNOSIS — L989 Disorder of the skin and subcutaneous tissue, unspecified: Secondary | ICD-10-CM

## 2018-12-02 DIAGNOSIS — J90 Pleural effusion, not elsewhere classified: Secondary | ICD-10-CM | POA: Diagnosis not present

## 2018-12-02 DIAGNOSIS — R5383 Other fatigue: Secondary | ICD-10-CM

## 2018-12-02 DIAGNOSIS — Z79899 Other long term (current) drug therapy: Secondary | ICD-10-CM | POA: Diagnosis not present

## 2018-12-02 DIAGNOSIS — R630 Anorexia: Secondary | ICD-10-CM

## 2018-12-02 DIAGNOSIS — R531 Weakness: Secondary | ICD-10-CM

## 2018-12-02 DIAGNOSIS — R1110 Vomiting, unspecified: Secondary | ICD-10-CM

## 2018-12-02 DIAGNOSIS — R0609 Other forms of dyspnea: Secondary | ICD-10-CM

## 2018-12-02 LAB — CMP (CANCER CENTER ONLY)
ALT: 11 U/L (ref 0–44)
AST: 19 U/L (ref 15–41)
Albumin: 3.2 g/dL — ABNORMAL LOW (ref 3.5–5.0)
Alkaline Phosphatase: 87 U/L (ref 38–126)
Anion gap: 8 (ref 5–15)
BUN: 8 mg/dL (ref 8–23)
CO2: 26 mmol/L (ref 22–32)
Calcium: 8.9 mg/dL (ref 8.9–10.3)
Chloride: 105 mmol/L (ref 98–111)
Creatinine: 0.71 mg/dL (ref 0.44–1.00)
GFR, Est AFR Am: >60 mL/min (ref >60)
GFR, Estimated: >60 mL/min (ref >60)
Glucose, Bld: 115 mg/dL — ABNORMAL HIGH (ref 70–99)
Potassium: 3.7 mmol/L (ref 3.5–5.1)
Sodium: 139 mmol/L (ref 135–145)
Total Bilirubin: 0.5 mg/dL (ref 0.3–1.2)
Total Protein: 7.6 g/dL (ref 6.5–8.1)

## 2018-12-02 LAB — TSH: TSH: 0.974 u[IU]/mL (ref 0.308–3.960)

## 2018-12-02 LAB — CBC WITH DIFFERENTIAL (CANCER CENTER ONLY)
Abs Immature Granulocytes: 0.01 10*3/uL (ref 0.00–0.07)
Basophils Absolute: 0 10*3/uL (ref 0.0–0.1)
Basophils Relative: 1 %
Eosinophils Absolute: 0.1 10*3/uL (ref 0.0–0.5)
Eosinophils Relative: 2 %
HCT: 38.3 % (ref 36.0–46.0)
Hemoglobin: 12.1 g/dL (ref 12.0–15.0)
Immature Granulocytes: 0 %
Lymphocytes Relative: 25 %
Lymphs Abs: 1.5 10*3/uL (ref 0.7–4.0)
MCH: 32.9 pg (ref 26.0–34.0)
MCHC: 31.6 g/dL (ref 30.0–36.0)
MCV: 104.1 fL — ABNORMAL HIGH (ref 80.0–100.0)
Monocytes Absolute: 0.5 10*3/uL (ref 0.1–1.0)
Monocytes Relative: 9 %
Neutro Abs: 3.8 10*3/uL (ref 1.7–7.7)
Neutrophils Relative %: 63 %
Platelet Count: 186 10*3/uL (ref 150–400)
RBC: 3.68 MIL/uL — ABNORMAL LOW (ref 3.87–5.11)
RDW: 13.2 % (ref 11.5–15.5)
WBC Count: 5.9 10*3/uL (ref 4.0–10.5)
nRBC: 0 % (ref 0.0–0.2)

## 2018-12-02 MED ORDER — SODIUM CHLORIDE 0.9 % IV SOLN
Freq: Once | INTRAVENOUS | Status: AC
  Administered 2018-12-02: 11:00:00 via INTRAVENOUS
  Filled 2018-12-02: qty 250

## 2018-12-02 MED ORDER — SODIUM CHLORIDE 0.9 % IV SOLN
480.0000 mg | Freq: Once | INTRAVENOUS | Status: AC
  Administered 2018-12-02: 480 mg via INTRAVENOUS
  Filled 2018-12-02: qty 48

## 2018-12-02 NOTE — Progress Notes (Signed)
[place] Telephone:([phone]) [phone]   Fax:([phone]) [place], [place] [address]  DIAGNOSIS: stage IV(T2b, N3, M1c) non-small cell lung cancer, squamous cell carcinoma.  The patient presented with large left upper lobe lung mass in addition to left hilar and mediastinal lymphadenopathy and left true vocal cord paralysis. PDL 1 expression was 0%  PRIOR THERAPY:  1) Palliative radiotherapy to the large left upper lobe lung mass as well as metastatic brain lesions. 2)  systemic chemotherapy with carboplatin for AUC of 5 and paclitaxel 175 MG/M2 every 3 weeks. First dose on 04/15/2017.  Status post 6 cycles.  Paclitaxel has been reduced to 150 mg/M2 starting from cycle #4 secondary to peripheral neuropathy.  Last dose of chemotherapy was given July 29, 2017. 3) palliative radiotherapy to the enlarging left upper lobe lung mass.  CURRENT THERAPY: Second line treatment with immunotherapy with Nivolumab 480 mg IV every 4 weeks.  First dose of February 25, 2018.  Status post 10 cycles.  INTERVAL HISTORY: Jacqueline Kidd 78 y.o. female returns to the clinic today for follow-up visit.  The patient continues to complain of fatigue and generalized weakness.  She also has few episodes of vomiting with no nausea almost every other day.  She denied having any current chest pain but has shortness of breath with exertion with no cough or hemoptysis.  She denied having any current fever or chills.  She has no diarrhea or constipation.  She continues to tolerate her treatment with nivolumab fairly well.  She is here for evaluation before starting cycle #11.   MEDICAL HISTORY: Past Medical History:  Diagnosis Date  . Chronic low back pain   . COPD (chronic obstructive pulmonary disease) ([place])   . GERD (gastroesophageal reflux disease)   . Hyperlipidemia   . Hypertension   . Lesion of left lung   . lung ca dx'd 01/2017  .  Osteoporosis     ALLERGIES:  is allergic to fosamax [alendronate]; hydrocodone-acetaminophen; and lisinopril-hydrochlorothiazide.  MEDICATIONS:  Current Outpatient Medications  Medication Sig Dispense Refill  . acetaminophen (TYLENOL) 325 MG tablet Take 650 mg by mouth every 6 (six) hours as needed for mild pain or moderate pain.     . Albuterol Sulfate 108 (90 Base) MCG/ACT AEPB Inhale 2 puffs into the lungs every 6 (six) hours as needed. 1 each 0  . cholecalciferol (VITAMIN D3) 25 MCG (1000 UT) tablet Take 1,000 Units by mouth daily.    . DULoxetine (CYMBALTA) 60 MG capsule Take 1 capsule (60 mg total) by mouth daily. (Patient not taking: Reported on 09/07/2018) 30 capsule 3  . gabapentin (NEURONTIN) 400 MG capsule Take 2 capsules (800 mg total) by mouth at bedtime. (Patient not taking: Reported on 09/07/2018) 270 capsule 3  . LORazepam (ATIVAN) 0.5 MG tablet Take 1 tablet (0.5 mg total) by mouth as needed for anxiety (take one tablet 30 minutes prior to MRI scans and radiation treatment). (Patient not taking: Reported on 09/07/2018) 10 tablet 0  . Omega-3 Fatty Acids (FISH OIL OMEGA-3 PO) Take by mouth.    . prochlorperazine (COMPAZINE) 10 MG tablet Take 10 mg by mouth every 6 (six) hours as needed for nausea or vomiting.     No current facility-administered medications for this visit.     SURGICAL HISTORY:  Past Surgical History:  Procedure Laterality Date  . ABDOMINAL HYSTERECTOMY    . TOTAL HIP ARTHROPLASTY  right    REVIEW OF SYSTEMS:  A comprehensive review of systems was negative except for: Constitutional: positive for fatigue Respiratory: positive for dyspnea on exertion Gastrointestinal: positive for vomiting Musculoskeletal: positive for muscle weakness   PHYSICAL EXAMINATION: General appearance: alert, cooperative, fatigued and no distress Head: Normocephalic, without obvious abnormality, atraumatic Neck: no adenopathy, no JVD, supple, symmetrical, trachea midline and  thyroid not enlarged, symmetric, no tenderness/mass/nodules Lymph nodes: Cervical, supraclavicular, and axillary nodes normal. Resp: clear to auscultation bilaterally Back: symmetric, no curvature. ROM normal. No CVA tenderness. Cardio: regular rate and rhythm, S1, S2 normal, no murmur, click, rub or gallop GI: soft, non-tender; bowel sounds normal; no masses,  no organomegaly Extremities: extremities normal, atraumatic, no cyanosis or edema  ECOG PERFORMANCE STATUS: 1 - Symptomatic but completely ambulatory  Blood pressure 118/80, pulse (!) 114, temperature 98.9 F (37.2 C), temperature source Oral, resp. rate 18, height 5\' 1"  (1.549 m), weight 115 lb (52.2 kg), SpO2 100 %.  LABORATORY DATA: Lab Results  Component Value Date   WBC 5.9 12/02/2018   HGB 12.1 12/02/2018   HCT 38.3 12/02/2018   MCV 104.1 (H) 12/02/2018   PLT 186 12/02/2018      Chemistry      Component Value Date/Time   NA 138 11/04/2018 0930   NA 138 07/08/2017 0831   K 3.8 11/04/2018 0930   K 3.3 (L) 07/08/2017 0831   CL 104 11/04/2018 0930   CO2 25 11/04/2018 0930   CO2 24 07/08/2017 0831   BUN 6 (L) 11/04/2018 0930   BUN 4.5 (L) 07/08/2017 0831   CREATININE 0.70 11/04/2018 0930   CREATININE 0.6 07/08/2017 0831      Component Value Date/Time   CALCIUM 8.8 (L) 11/04/2018 0930   CALCIUM 9.0 07/08/2017 0831   ALKPHOS 94 11/04/2018 0930   ALKPHOS 119 07/08/2017 0831   AST 15 11/04/2018 0930   AST 25 07/08/2017 0831   ALT 7 11/04/2018 0930   ALT 16 07/08/2017 0831   BILITOT 0.6 11/04/2018 0930   BILITOT 0.52 07/08/2017 0831       RADIOGRAPHIC STUDIES: Ct Chest W Contrast  Result Date: 11/02/2018 CLINICAL DATA:  Non-small-cell lung cancer. EXAM: CT CHEST, ABDOMEN, AND PELVIS WITH CONTRAST TECHNIQUE: Multidetector CT imaging of the chest, abdomen and pelvis was performed following the standard protocol during bolus administration of intravenous contrast. CONTRAST:  128mL ISOVUE-300 IOPAMIDOL  (ISOVUE-300) INJECTION 61% COMPARISON:  08/10/2018 FINDINGS: CT CHEST FINDINGS Cardiovascular: The heart size is normal. Small pericardial effusion has not changed substantially in the interval. Aberrant origin right subclavian artery again noted. Coronary artery calcification is evident. Atherosclerotic calcification is noted in the wall of the thoracic aorta. Mediastinum/Nodes: No mediastinal lymphadenopathy. No right hilar lymphadenopathy. Similar appearance of left upper lobe collapse into the superior left hilum with post treatment changes in the mid and lower left hilum. Lungs/Pleura: Low-density lesion measured previously in the left upper lobe at 2.1 x 1.8 cm now measures 2.1 x 1.6 cm when measuring at the same level. Left upper lobe collapse persists. Centrilobular and paraseptal emphysema evident. Bilateral diffuse subpleural reticulation is similar to prior. No new suspicious pulmonary nodule or mass. Small to moderate left pleural effusion is slightly progressed in the interval. Musculoskeletal: Bones are diffusely demineralized. No worrisome lytic or sclerotic osseous abnormality. CT ABDOMEN PELVIS FINDINGS Hepatobiliary: No suspicious focal abnormality within the liver parenchyma. Tiny calcified gallstones evident. No intrahepatic or extrahepatic biliary dilation. Pancreas: No focal mass lesion. No dilatation of the  main duct. No intraparenchymal cyst. No peripancreatic edema. Spleen: Calcified granulomata. Adrenals/Urinary Tract: No adrenal nodule or mass. Kidneys unremarkable. No evidence for hydroureter. Bladder partially obscured by beam hardening artifact from right hip replacement. Stomach/Bowel: Stomach is unremarkable. No gastric wall thickening. No evidence of outlet obstruction. Duodenum is normally positioned as is the ligament of Treitz. No small bowel wall thickening. No small bowel dilatation. The terminal ileum is normal. No gross colonic mass. No colonic wall thickening.  Vascular/Lymphatic: There is abdominal aortic atherosclerosis without aneurysm. There is no gastrohepatic or hepatoduodenal ligament lymphadenopathy. No intraperitoneal or retroperitoneal lymphadenopathy. No pelvic sidewall lymphadenopathy. Reproductive: The uterus is surgically absent. There is no adnexal mass. Other: No intraperitoneal free fluid. Musculoskeletal: Bones are diffusely demineralized. Status post right hip replacement. No worrisome lytic or sclerotic osseous abnormality. Stable appearance of compression deformity involving all lumbar vertebral bodies. IMPRESSION: 1. Similar appearance of the left upper lobe lesion with persistent left upper lobe collapse. 2. Small to moderate left pleural effusion shows slight interval progression. 3. No evidence for metastatic disease in the abdomen or pelvis. 4. Cholelithiasis. 5.  Aortic Atherosclerois (ICD10-170.0) 6.  Emphysema. ([id number]) Electronically Signed   By: Misty Stanley M.D.   On: 11/02/2018 15:58   Ct Abdomen Pelvis W Contrast  Result Date: 11/02/2018 CLINICAL DATA:  Non-small-cell lung cancer. EXAM: CT CHEST, ABDOMEN, AND PELVIS WITH CONTRAST TECHNIQUE: Multidetector CT imaging of the chest, abdomen and pelvis was performed following the standard protocol during bolus administration of intravenous contrast. CONTRAST:  142mL ISOVUE-300 IOPAMIDOL (ISOVUE-300) INJECTION 61% COMPARISON:  08/10/2018 FINDINGS: CT CHEST FINDINGS Cardiovascular: The heart size is normal. Small pericardial effusion has not changed substantially in the interval. Aberrant origin right subclavian artery again noted. Coronary artery calcification is evident. Atherosclerotic calcification is noted in the wall of the thoracic aorta. Mediastinum/Nodes: No mediastinal lymphadenopathy. No right hilar lymphadenopathy. Similar appearance of left upper lobe collapse into the superior left hilum with post treatment changes in the mid and lower left hilum. Lungs/Pleura: Low-density  lesion measured previously in the left upper lobe at 2.1 x 1.8 cm now measures 2.1 x 1.6 cm when measuring at the same level. Left upper lobe collapse persists. Centrilobular and paraseptal emphysema evident. Bilateral diffuse subpleural reticulation is similar to prior. No new suspicious pulmonary nodule or mass. Small to moderate left pleural effusion is slightly progressed in the interval. Musculoskeletal: Bones are diffusely demineralized. No worrisome lytic or sclerotic osseous abnormality. CT ABDOMEN PELVIS FINDINGS Hepatobiliary: No suspicious focal abnormality within the liver parenchyma. Tiny calcified gallstones evident. No intrahepatic or extrahepatic biliary dilation. Pancreas: No focal mass lesion. No dilatation of the main duct. No intraparenchymal cyst. No peripancreatic edema. Spleen: Calcified granulomata. Adrenals/Urinary Tract: No adrenal nodule or mass. Kidneys unremarkable. No evidence for hydroureter. Bladder partially obscured by beam hardening artifact from right hip replacement. Stomach/Bowel: Stomach is unremarkable. No gastric wall thickening. No evidence of outlet obstruction. Duodenum is normally positioned as is the ligament of Treitz. No small bowel wall thickening. No small bowel dilatation. The terminal ileum is normal. No gross colonic mass. No colonic wall thickening. Vascular/Lymphatic: There is abdominal aortic atherosclerosis without aneurysm. There is no gastrohepatic or hepatoduodenal ligament lymphadenopathy. No intraperitoneal or retroperitoneal lymphadenopathy. No pelvic sidewall lymphadenopathy. Reproductive: The uterus is surgically absent. There is no adnexal mass. Other: No intraperitoneal free fluid. Musculoskeletal: Bones are diffusely demineralized. Status post right hip replacement. No worrisome lytic or sclerotic osseous abnormality. Stable appearance of compression deformity involving all lumbar vertebral  bodies. IMPRESSION: 1. Similar appearance of the left upper  lobe lesion with persistent left upper lobe collapse. 2. Small to moderate left pleural effusion shows slight interval progression. 3. No evidence for metastatic disease in the abdomen or pelvis. 4. Cholelithiasis. 5.  Aortic Atherosclerois (ICD10-170.0) 6.  Emphysema. ([id number]) Electronically Signed   By: Misty Stanley M.D.   On: 11/02/2018 15:58    ASSESSMENT AND PLAN: This is a very pleasant 78 years old white female with stage IV (T2b, N3, M1 C) non-small cell lung cancer, squamous cell carcinoma presented with large left upper lobe lung mass in addition to mediastinal and supraclavicular lymphadenopathy as well as metastatic brain lesions diagnosed in September 2018. The patient underwent a course of palliative radiotherapy to the left upper lobe lung mass as well as the metastatic brain lesions. She is currently undergoing palliative systemic chemotherapy with carboplatin for AUC of 5 and paclitaxel 175 mg/M2 every 3 weeks, status post 6 cycles. She has been in observation for several months. Repeat CT scan of the chest, abdomen and pelvis showed evidence for disease progression with enlargement of right adrenal nodule in addition to progressive enlargement of small left pleural effusion in addition to the right parietal scalp lesion.  The patient also was found to have evidence for progressive disease in the brain. She was started on second line treatment with immunotherapy with Nivolumab 480 mg IV every 4 weeks status post 10  cycles. The patient has been tolerating this treatment well with no concerning adverse effects. I recommended for her to proceed with cycle #11 today. For the vomiting, she has a history of GERD and I recommended for the patient to start taking Prilosec over-the-counter on daily basis. For the depression, lack of appetite and weight loss, I will start the patient on Remeron 15 mg p.o. nightly. She will come back for follow-up visit in 4 weeks for evaluation before the  next cycle of her treatment. She was advised to call immediately if she has any concerning symptoms in the interval. The patient voices understanding of current disease status and treatment options and is in agreement with the current care plan. All questions were answered. The patient knows to call the clinic with any problems, questions or concerns. We can certainly see the patient much sooner if necessary.  Disclaimer: This note was dictated with voice recognition software. Similar sounding words can inadvertently be transcribed and may not be corrected upon review.

## 2018-12-02 NOTE — Patient Instructions (Signed)
[facility] Discharge Instructions for Patients Receiving Chemotherapy  Today you received the following chemotherapy agents Nivolumab (OPDIVO).  To help prevent nausea and vomiting after your treatment, we encourage you to take your nausea medication as prescribed.   If you develop nausea and vomiting that is not controlled by your nausea medication, call the clinic.   BELOW ARE SYMPTOMS THAT SHOULD BE REPORTED IMMEDIATELY:  *FEVER GREATER THAN 100.5 F  *CHILLS WITH OR WITHOUT FEVER  NAUSEA AND VOMITING THAT IS NOT CONTROLLED WITH YOUR NAUSEA MEDICATION  *UNUSUAL SHORTNESS OF BREATH  *UNUSUAL BRUISING OR BLEEDING  TENDERNESS IN MOUTH AND THROAT WITH OR WITHOUT PRESENCE OF ULCERS  *URINARY PROBLEMS  *BOWEL PROBLEMS  UNUSUAL RASH Items with * indicate a potential emergency and should be followed up as soon as possible.  Feel free to call the clinic should you have any questions or concerns. The clinic phone number is [phone].  Please show the [facility] at check-in to the Emergency Department and triage nurse.  Coronavirus (COVID-19) Are you at risk?  Are you at risk for the Coronavirus (COVID-19)?  To be considered HIGH RISK for Coronavirus (COVID-19), you have to meet the following criteria:  . Traveled to [place], [place], [place], [place] or [place]; or in the [place] to [place], [place], [place], or [place]; and have fever, cough, and shortness of breath within the last 2 weeks of travel OR . Been in close contact with a person diagnosed with COVID-19 within the last 2 weeks and have fever, cough, and shortness of breath . IF YOU DO NOT MEET THESE CRITERIA, YOU ARE CONSIDERED LOW RISK FOR COVID-19.  What to do if you are HIGH RISK for COVID-19?  Marland Kitchen If you are having a medical emergency, call 911. . Seek medical care right away. Before you go to a doctor's office, urgent care or emergency department, call ahead and tell them  about your recent travel, contact with someone diagnosed with COVID-19, and your symptoms. You should receive instructions from your physician's office regarding next steps of care.  . When you arrive at healthcare provider, tell the healthcare staff immediately you have returned from visiting [place], [place], [place], [place] or [place]; or traveled in the [place] to [place], [place], [place], or [place]; in the last two weeks or you have been in close contact with a person diagnosed with COVID-19 in the last 2 weeks.   . Tell the health care staff about your symptoms: fever, cough and shortness of breath. . After you have been seen by a medical provider, you will be either: o Tested for (COVID-19) and discharged home on quarantine except to seek medical care if symptoms worsen, and asked to  - Stay home and avoid contact with others until you get your results (4-5 days)  - Avoid travel on public transportation if possible (such as bus, train, or airplane) or o Sent to the Emergency Department by EMS for evaluation, COVID-19 testing, and possible admission depending on your condition and test results.  What to do if you are LOW RISK for COVID-19?  Reduce your risk of any infection by using the same precautions used for avoiding the common cold or flu:  Marland Kitchen Wash your hands often with soap and warm water for at least 20 seconds.  If soap and water are not readily available, use an alcohol-based hand sanitizer with at least 60% alcohol.  . If coughing or  sneezing, cover your mouth and nose by coughing or sneezing into the elbow areas of your shirt or coat, into a tissue or into your sleeve (not your hands). . Avoid shaking hands with others and consider head nods or verbal greetings only. . Avoid touching your eyes, nose, or mouth with unwashed hands.  . Avoid close contact with people who are sick. . Avoid places or events with large numbers of people in one location, like concerts or  sporting events. . Carefully consider travel plans you have or are making. . If you are planning any travel outside or inside the [place], visit the CDC's Travelers' Health webpage for the latest health notices. . If you have some symptoms but not all symptoms, continue to monitor at home and seek medical attention if your symptoms worsen. . If you are having a medical emergency, call 911.   [facility] / e-Visit: [email or web address]         [facility]: [facility]: [phone]                   [facility]: [phone]

## 2018-12-30 ENCOUNTER — Inpatient Hospital Stay: Payer: Medicare Other | Attending: Internal Medicine

## 2018-12-30 ENCOUNTER — Inpatient Hospital Stay: Payer: Medicare Other

## 2018-12-30 ENCOUNTER — Encounter: Payer: Self-pay | Admitting: Internal Medicine

## 2018-12-30 ENCOUNTER — Inpatient Hospital Stay (HOSPITAL_BASED_OUTPATIENT_CLINIC_OR_DEPARTMENT_OTHER): Payer: Medicare Other | Admitting: Internal Medicine

## 2018-12-30 ENCOUNTER — Other Ambulatory Visit: Payer: Self-pay

## 2018-12-30 VITALS — HR 96

## 2018-12-30 VITALS — BP 116/65 | HR 109 | Temp 98.0°F | Resp 18 | Ht 61.0 in | Wt 112.9 lb

## 2018-12-30 DIAGNOSIS — C3412 Malignant neoplasm of upper lobe, left bronchus or lung: Secondary | ICD-10-CM

## 2018-12-30 DIAGNOSIS — Z5112 Encounter for antineoplastic immunotherapy: Secondary | ICD-10-CM | POA: Diagnosis not present

## 2018-12-30 DIAGNOSIS — C7931 Secondary malignant neoplasm of brain: Secondary | ICD-10-CM | POA: Diagnosis not present

## 2018-12-30 DIAGNOSIS — K219 Gastro-esophageal reflux disease without esophagitis: Secondary | ICD-10-CM

## 2018-12-30 DIAGNOSIS — E279 Disorder of adrenal gland, unspecified: Secondary | ICD-10-CM | POA: Diagnosis not present

## 2018-12-30 DIAGNOSIS — Z79899 Other long term (current) drug therapy: Secondary | ICD-10-CM | POA: Diagnosis not present

## 2018-12-30 DIAGNOSIS — R5382 Chronic fatigue, unspecified: Secondary | ICD-10-CM

## 2018-12-30 DIAGNOSIS — J90 Pleural effusion, not elsewhere classified: Secondary | ICD-10-CM

## 2018-12-30 DIAGNOSIS — C3490 Malignant neoplasm of unspecified part of unspecified bronchus or lung: Secondary | ICD-10-CM

## 2018-12-30 DIAGNOSIS — F329 Major depressive disorder, single episode, unspecified: Secondary | ICD-10-CM

## 2018-12-30 DIAGNOSIS — R630 Anorexia: Secondary | ICD-10-CM

## 2018-12-30 DIAGNOSIS — R634 Abnormal weight loss: Secondary | ICD-10-CM

## 2018-12-30 LAB — CMP (CANCER CENTER ONLY)
ALT: 7 U/L (ref 0–44)
AST: 16 U/L (ref 15–41)
Albumin: 3.2 g/dL — ABNORMAL LOW (ref 3.5–5.0)
Alkaline Phosphatase: 84 U/L (ref 38–126)
Anion gap: 7 (ref 5–15)
BUN: 9 mg/dL (ref 8–23)
CO2: 26 mmol/L (ref 22–32)
Calcium: 8.9 mg/dL (ref 8.9–10.3)
Chloride: 105 mmol/L (ref 98–111)
Creatinine: 0.68 mg/dL (ref 0.44–1.00)
GFR, Est AFR Am: >60 mL/min (ref >60)
GFR, Estimated: >60 mL/min (ref >60)
Glucose, Bld: 107 mg/dL — ABNORMAL HIGH (ref 70–99)
Potassium: 3.7 mmol/L (ref 3.5–5.1)
Sodium: 138 mmol/L (ref 135–145)
Total Bilirubin: 0.6 mg/dL (ref 0.3–1.2)
Total Protein: 7.5 g/dL (ref 6.5–8.1)

## 2018-12-30 LAB — CBC WITH DIFFERENTIAL (CANCER CENTER ONLY)
Abs Immature Granulocytes: 0.01 10*3/uL (ref 0.00–0.07)
Basophils Absolute: 0 10*3/uL (ref 0.0–0.1)
Basophils Relative: 0 %
Eosinophils Absolute: 0.1 10*3/uL (ref 0.0–0.5)
Eosinophils Relative: 1 %
HCT: 36.5 % (ref 36.0–46.0)
Hemoglobin: 11.9 g/dL — ABNORMAL LOW (ref 12.0–15.0)
Immature Granulocytes: 0 %
Lymphocytes Relative: 19 %
Lymphs Abs: 1 10*3/uL (ref 0.7–4.0)
MCH: 33.3 pg (ref 26.0–34.0)
MCHC: 32.6 g/dL (ref 30.0–36.0)
MCV: 102.2 fL — ABNORMAL HIGH (ref 80.0–100.0)
Monocytes Absolute: 0.5 10*3/uL (ref 0.1–1.0)
Monocytes Relative: 9 %
Neutro Abs: 3.9 10*3/uL (ref 1.7–7.7)
Neutrophils Relative %: 71 %
Platelet Count: 186 10*3/uL (ref 150–400)
RBC: 3.57 MIL/uL — ABNORMAL LOW (ref 3.87–5.11)
RDW: 13.2 % (ref 11.5–15.5)
WBC Count: 5.5 10*3/uL (ref 4.0–10.5)
nRBC: 0 % (ref 0.0–0.2)

## 2018-12-30 LAB — TSH: TSH: 1.232 u[IU]/mL (ref 0.308–3.960)

## 2018-12-30 MED ORDER — SODIUM CHLORIDE 0.9 % IV SOLN
Freq: Once | INTRAVENOUS | Status: AC
  Administered 2018-12-30: 11:00:00 via INTRAVENOUS
  Filled 2018-12-30: qty 250

## 2018-12-30 MED ORDER — SODIUM CHLORIDE 0.9 % IV SOLN
480.0000 mg | Freq: Once | INTRAVENOUS | Status: AC
  Administered 2018-12-30: 480 mg via INTRAVENOUS
  Filled 2018-12-30: qty 48

## 2018-12-30 NOTE — Progress Notes (Signed)
[place] [phone]   Fax:([phone] [place], [place] [address]  DIAGNOSIS: stage IV(T2b, N3, M1c) non-small cell lung cancer, squamous cell carcinoma.  The patient presented with large left upper lobe lung mass in addition to left hilar and mediastinal lymphadenopathy and left true vocal cord paralysis. PDL 1 expression was 0%  PRIOR THERAPY:  1) Palliative radiotherapy to the large left upper lobe lung mass as well as metastatic brain lesions. 2)  systemic chemotherapy with carboplatin for AUC of 5 and paclitaxel 175 MG/M2 every 3 weeks. First dose on 04/15/2017.  Status post 6 cycles.  Paclitaxel has been reduced to 150 mg/M2 starting from cycle #4 secondary to peripheral neuropathy.  Last dose of chemotherapy was given July 29, 2017. 3) palliative radiotherapy to the enlarging left upper lobe lung mass.  CURRENT THERAPY: Second line treatment with immunotherapy with Nivolumab 480 mg IV every 4 weeks.  First dose of February 25, 2018.  Status post 11 cycles.  INTERVAL HISTORY: Jacqueline Kidd 78 y.o. female returns to the clinic today for follow-up visit.  The patient is feeling fine today with no concerning complaints except for intermittent heartburn.  She denied having any chest pain, shortness of breath, cough or hemoptysis.  She denied having any fever or chills.  She has no nausea, vomiting, diarrhea or constipation.  She denied having any headache or visual changes.  The patient is here today for evaluation before starting cycle #12 of her treatment.   MEDICAL HISTORY: Past Medical History:  Diagnosis Date   Chronic low back pain    COPD (chronic obstructive pulmonary disease) (HCC)    GERD (gastroesophageal reflux disease)    Hyperlipidemia    Hypertension    Lesion of left lung    lung ca dx'd 01/2017   Osteoporosis     ALLERGIES:  is allergic to fosamax  [alendronate]; hydrocodone-acetaminophen; and lisinopril-hydrochlorothiazide.  MEDICATIONS:  Current Outpatient Medications  Medication Sig Dispense Refill   acetaminophen (TYLENOL) 325 MG tablet Take 650 mg by mouth every 6 (six) hours as needed for mild pain or moderate pain.      Albuterol Sulfate 108 (90 Base) MCG/ACT AEPB Inhale 2 puffs into the lungs every 6 (six) hours as needed. 1 each 0   cholecalciferol (VITAMIN D3) 25 MCG (1000 UT) tablet Take 1,000 Units by mouth daily.     DULoxetine (CYMBALTA) 60 MG capsule Take 1 capsule (60 mg total) by mouth daily. (Patient not taking: Reported on 09/07/2018) 30 capsule 3   gabapentin (NEURONTIN) 400 MG capsule Take 2 capsules (800 mg total) by mouth at bedtime. (Patient not taking: Reported on 09/07/2018) 270 capsule 3   LORazepam (ATIVAN) 0.5 MG tablet Take 1 tablet (0.5 mg total) by mouth as needed for anxiety (take one tablet 30 minutes prior to MRI scans and radiation treatment). (Patient not taking: Reported on 09/07/2018) 10 tablet 0   Omega-3 Fatty Acids (FISH OIL OMEGA-3 PO) Take by mouth.     prochlorperazine (COMPAZINE) 10 MG tablet Take 10 mg by mouth every 6 (six) hours as needed for nausea or vomiting.     No current facility-administered medications for this visit.     SURGICAL HISTORY:  Past Surgical History:  Procedure Laterality Date   ABDOMINAL HYSTERECTOMY     TOTAL HIP ARTHROPLASTY     right    REVIEW OF SYSTEMS:  A comprehensive review  of systems was negative except for: Constitutional: positive for fatigue Respiratory: positive for dyspnea on exertion Gastrointestinal: positive for dyspepsia Musculoskeletal: positive for muscle weakness   PHYSICAL EXAMINATION: General appearance: alert, cooperative, fatigued and no distress Head: Normocephalic, without obvious abnormality, atraumatic Neck: no adenopathy, no JVD, supple, symmetrical, trachea midline and thyroid not enlarged, symmetric, no  tenderness/mass/nodules Lymph nodes: Cervical, supraclavicular, and axillary nodes normal. Resp: wheezes bilaterally Back: symmetric, no curvature. ROM normal. No CVA tenderness. Cardio: regular rate and rhythm, S1, S2 normal, no murmur, click, rub or gallop GI: soft, non-tender; bowel sounds normal; no masses,  no organomegaly Extremities: extremities normal, atraumatic, no cyanosis or edema  ECOG PERFORMANCE STATUS: 1 - Symptomatic but completely ambulatory  Blood pressure 116/65, pulse (!) 109, temperature 98 F (36.7 C), temperature source Oral, resp. rate 18, height 5\' 1"  (1.549 m), weight 112 lb 14.4 oz (51.2 kg), SpO2 96 %.  LABORATORY DATA: Lab Results  Component Value Date   WBC 5.5 12/30/2018   HGB 11.9 (L) 12/30/2018   HCT 36.5 12/30/2018   MCV 102.2 (H) 12/30/2018   PLT 186 12/30/2018      Chemistry      Component Value Date/Time   NA 139 12/02/2018 0952   NA 138 07/08/2017 0831   K 3.7 12/02/2018 0952   K 3.3 (L) 07/08/2017 0831   CL 105 12/02/2018 0952   CO2 26 12/02/2018 0952   CO2 24 07/08/2017 0831   BUN 8 12/02/2018 0952   BUN 4.5 (L) 07/08/2017 0831   CREATININE 0.71 12/02/2018 0952   CREATININE 0.6 07/08/2017 0831      Component Value Date/Time   CALCIUM 8.9 12/02/2018 0952   CALCIUM 9.0 07/08/2017 0831   ALKPHOS 87 12/02/2018 0952   ALKPHOS 119 07/08/2017 0831   AST 19 12/02/2018 0952   AST 25 07/08/2017 0831   ALT 11 12/02/2018 0952   ALT 16 07/08/2017 0831   BILITOT 0.5 12/02/2018 0952   BILITOT 0.52 07/08/2017 0831       RADIOGRAPHIC STUDIES: No results found.  ASSESSMENT AND PLAN: This is a very pleasant 78 years old white female with stage IV (T2b, N3, M1 C) non-small cell lung cancer, squamous cell carcinoma presented with large left upper lobe lung mass in addition to mediastinal and supraclavicular lymphadenopathy as well as metastatic brain lesions diagnosed in September 2018. The patient underwent a course of palliative  radiotherapy to the left upper lobe lung mass as well as the metastatic brain lesions. She is currently undergoing palliative systemic chemotherapy with carboplatin for AUC of 5 and paclitaxel 175 mg/M2 every 3 weeks, status post 6 cycles. She has been in observation for several months. Repeat CT scan of the chest, abdomen and pelvis showed evidence for disease progression with enlargement of right adrenal nodule in addition to progressive enlargement of small left pleural effusion in addition to the right parietal scalp lesion.  The patient also was found to have evidence for progressive disease in the brain. She was started on second line treatment with immunotherapy with Nivolumab 480 mg IV every 4 weeks status post 11  cycles.  She continues to tolerate this treatment well. I recommended for the patient to proceed with cycle #12 today as planned. I will see her back for follow-up visit in 4 weeks for evaluation with repeat CT scan of the chest, abdomen and pelvis for restaging of her disease. For the vomiting, she has a history of GERD and I recommended for the patient  to start taking Prilosec over-the-counter on daily basis.  She has not started this medication yet. For the depression, lack of appetite and weight loss, she will continue her treatment with Remeron 15 mg p.o. nightly. The patient was advised to call immediately if she has any concerning symptoms in the interval. The patient voices understanding of current disease status and treatment options and is in agreement with the current care plan. All questions were answered. The patient knows to call the clinic with any problems, questions or concerns. We can certainly see the patient much sooner if necessary.  Disclaimer: This note was dictated with voice recognition software. Similar sounding words can inadvertently be transcribed and may not be corrected upon review.

## 2018-12-30 NOTE — Patient Instructions (Signed)
Coronavirus (COVID-19) Are you at risk?  Are you at risk for the Coronavirus (COVID-19)?  To be considered HIGH RISK for Coronavirus (COVID-19), you have to meet the following criteria:  . Traveled to [place], [place], [place], [place] or [place]; or in the [place] to [place], [place], [place], or [place]; and have fever, cough, and shortness of breath within the last 2 weeks of travel OR . Been in close contact with a person diagnosed with COVID-19 within the last 2 weeks and have fever, cough, and shortness of breath . IF YOU DO NOT MEET THESE CRITERIA, YOU ARE CONSIDERED LOW RISK FOR COVID-19.  What to do if you are HIGH RISK for COVID-19?  Marland Kitchen If you are having a medical emergency, call 911. . Seek medical care right away. Before you go to a doctor's office, urgent care or emergency department, call ahead and tell them about your recent travel, contact with someone diagnosed with COVID-19, and your symptoms. You should receive instructions from your physician's office regarding next steps of care.  . When you arrive at healthcare provider, tell the healthcare staff immediately you have returned from visiting [place], [place], [place], [place] or [place]; or traveled in the [place] to [place], [place], [place], or [place]; in the last two weeks or you have been in close contact with a person diagnosed with COVID-19 in the last 2 weeks.   . Tell the health care staff about your symptoms: fever, cough and shortness of breath. . After you have been seen by a medical provider, you will be either: o Tested for (COVID-19) and discharged home on quarantine except to seek medical care if symptoms worsen, and asked to  - Stay home and avoid contact with others until you get your results (4-5 days)  - Avoid travel on public transportation if possible (such as bus, train, or airplane) or o Sent to the Emergency Department by EMS for evaluation, COVID-19 testing, and possible  admission depending on your condition and test results.  What to do if you are LOW RISK for COVID-19?  Reduce your risk of any infection by using the same precautions used for avoiding the common cold or flu:  Marland Kitchen Wash your hands often with soap and warm water for at least 20 seconds.  If soap and water are not readily available, use an alcohol-based hand sanitizer with at least 60% alcohol.  . If coughing or sneezing, cover your mouth and nose by coughing or sneezing into the elbow areas of your shirt or coat, into a tissue or into your sleeve (not your hands). . Avoid shaking hands with others and consider head nods or verbal greetings only. . Avoid touching your eyes, nose, or mouth with unwashed hands.  . Avoid close contact with people who are sick. . Avoid places or events with large numbers of people in one location, like concerts or sporting events. . Carefully consider travel plans you have or are making. . If you are planning any travel outside or inside the [place], visit the CDC's Travelers' Health webpage for the latest health notices. . If you have some symptoms but not all symptoms, continue to monitor at home and seek medical attention if your symptoms worsen. . If you are having a medical emergency, call 911.   [place] / e-Visit: [email or web address]         [facility]: [place]  [facility]: [place]: [place] Discharge Instructions for Patients Receiving Chemotherapy  Today you received the following chemotherapy agents Opdivo  To help prevent nausea and vomiting after your treatment, we encourage you to take your nausea medication as directed.    If you develop nausea and vomiting that is not controlled by your nausea medication, call the clinic.   BELOW ARE  SYMPTOMS THAT SHOULD BE REPORTED IMMEDIATELY:  *FEVER GREATER THAN 100.5 F  *CHILLS WITH OR WITHOUT FEVER  NAUSEA AND VOMITING THAT IS NOT CONTROLLED WITH YOUR NAUSEA MEDICATION  *UNUSUAL SHORTNESS OF BREATH  *UNUSUAL BRUISING OR BLEEDING  TENDERNESS IN MOUTH AND THROAT WITH OR WITHOUT PRESENCE OF ULCERS  *URINARY PROBLEMS  *BOWEL PROBLEMS  UNUSUAL RASH Items with * indicate a potential emergency and should be followed up as soon as possible.  Feel free to call the clinic should you have any questions or concerns. The clinic phone number is [phone].  Please show the [facility] at check-in to the Emergency Department and triage nurse.

## 2018-12-31 ENCOUNTER — Telehealth: Payer: Self-pay | Admitting: Internal Medicine

## 2018-12-31 NOTE — Telephone Encounter (Signed)
Added additional cycles per 6/25 los - pt to get an updated schedule next visit.

## 2019-01-17 ENCOUNTER — Other Ambulatory Visit: Payer: Self-pay

## 2019-01-17 ENCOUNTER — Ambulatory Visit
Admission: RE | Admit: 2019-01-17 | Discharge: 2019-01-17 | Disposition: A | Discharge status: Home / Self Care | Payer: Medicare Other | Source: Ambulatory Visit | Attending: Internal Medicine | Admitting: Internal Medicine

## 2019-01-17 DIAGNOSIS — J850 Gangrene and necrosis of lung: Secondary | ICD-10-CM | POA: Diagnosis not present

## 2019-01-17 DIAGNOSIS — E278 Other specified disorders of adrenal gland: Secondary | ICD-10-CM | POA: Diagnosis not present

## 2019-01-17 DIAGNOSIS — C3490 Malignant neoplasm of unspecified part of unspecified bronchus or lung: Secondary | ICD-10-CM

## 2019-01-17 DIAGNOSIS — J90 Pleural effusion, not elsewhere classified: Secondary | ICD-10-CM | POA: Diagnosis not present

## 2019-01-17 MED ORDER — IOPAMIDOL (ISOVUE-300) INJECTION 61%
100.0000 mL | Freq: Once | INTRAVENOUS | Status: AC | PRN
  Administered 2019-01-17: 100 mL via INTRAVENOUS

## 2019-01-26 ENCOUNTER — Other Ambulatory Visit: Payer: Self-pay | Admitting: Radiation Therapy

## 2019-01-26 ENCOUNTER — Other Ambulatory Visit: Payer: Self-pay | Admitting: Medical Oncology

## 2019-01-26 DIAGNOSIS — C3412 Malignant neoplasm of upper lobe, left bronchus or lung: Secondary | ICD-10-CM

## 2019-01-27 ENCOUNTER — Inpatient Hospital Stay (HOSPITAL_BASED_OUTPATIENT_CLINIC_OR_DEPARTMENT_OTHER): Payer: Medicare Other | Admitting: Internal Medicine

## 2019-01-27 ENCOUNTER — Telehealth: Payer: Self-pay | Admitting: *Deleted

## 2019-01-27 ENCOUNTER — Inpatient Hospital Stay: Payer: Medicare Other | Attending: Internal Medicine

## 2019-01-27 ENCOUNTER — Encounter: Payer: Self-pay | Admitting: Internal Medicine

## 2019-01-27 ENCOUNTER — Inpatient Hospital Stay: Payer: Medicare Other

## 2019-01-27 ENCOUNTER — Other Ambulatory Visit: Payer: Self-pay

## 2019-01-27 VITALS — BP 113/77 | HR 109 | Temp 98.6°F | Resp 16 | Ht 61.0 in | Wt 109.3 lb

## 2019-01-27 VITALS — HR 98

## 2019-01-27 DIAGNOSIS — C7931 Secondary malignant neoplasm of brain: Secondary | ICD-10-CM | POA: Insufficient documentation

## 2019-01-27 DIAGNOSIS — R110 Nausea: Secondary | ICD-10-CM

## 2019-01-27 DIAGNOSIS — Z5112 Encounter for antineoplastic immunotherapy: Secondary | ICD-10-CM | POA: Diagnosis not present

## 2019-01-27 DIAGNOSIS — J90 Pleural effusion, not elsewhere classified: Secondary | ICD-10-CM

## 2019-01-27 DIAGNOSIS — C3412 Malignant neoplasm of upper lobe, left bronchus or lung: Secondary | ICD-10-CM

## 2019-01-27 DIAGNOSIS — G620 Drug-induced polyneuropathy: Secondary | ICD-10-CM | POA: Diagnosis not present

## 2019-01-27 DIAGNOSIS — T451X5A Adverse effect of antineoplastic and immunosuppressive drugs, initial encounter: Secondary | ICD-10-CM

## 2019-01-27 DIAGNOSIS — E279 Disorder of adrenal gland, unspecified: Secondary | ICD-10-CM | POA: Diagnosis not present

## 2019-01-27 DIAGNOSIS — R630 Anorexia: Secondary | ICD-10-CM

## 2019-01-27 DIAGNOSIS — R0609 Other forms of dyspnea: Secondary | ICD-10-CM

## 2019-01-27 DIAGNOSIS — Z79899 Other long term (current) drug therapy: Secondary | ICD-10-CM | POA: Diagnosis not present

## 2019-01-27 DIAGNOSIS — R634 Abnormal weight loss: Secondary | ICD-10-CM

## 2019-01-27 DIAGNOSIS — F329 Major depressive disorder, single episode, unspecified: Secondary | ICD-10-CM

## 2019-01-27 LAB — CBC WITH DIFFERENTIAL (CANCER CENTER ONLY)
Abs Immature Granulocytes: 0.03 K/uL (ref 0.00–0.07)
Basophils Absolute: 0 K/uL (ref 0.0–0.1)
Basophils Relative: 1 %
Eosinophils Absolute: 0.1 K/uL (ref 0.0–0.5)
Eosinophils Relative: 1 %
HCT: 36.5 % (ref 36.0–46.0)
Hemoglobin: 12 g/dL (ref 12.0–15.0)
Immature Granulocytes: 1 %
Lymphocytes Relative: 21 %
Lymphs Abs: 1.2 K/uL (ref 0.7–4.0)
MCH: 33.1 pg (ref 26.0–34.0)
MCHC: 32.9 g/dL (ref 30.0–36.0)
MCV: 100.6 fL — ABNORMAL HIGH (ref 80.0–100.0)
Monocytes Absolute: 0.6 K/uL (ref 0.1–1.0)
Monocytes Relative: 10 %
Neutro Abs: 3.9 K/uL (ref 1.7–7.7)
Neutrophils Relative %: 66 %
Platelet Count: 169 K/uL (ref 150–400)
RBC: 3.63 MIL/uL — ABNORMAL LOW (ref 3.87–5.11)
RDW: 13.2 % (ref 11.5–15.5)
WBC Count: 5.8 K/uL (ref 4.0–10.5)
nRBC: 0 % (ref 0.0–0.2)

## 2019-01-27 LAB — CMP (CANCER CENTER ONLY)
ALT: 8 U/L (ref 0–44)
AST: 16 U/L (ref 15–41)
Albumin: 3.2 g/dL — ABNORMAL LOW (ref 3.5–5.0)
Alkaline Phosphatase: 90 U/L (ref 38–126)
Anion gap: 7 (ref 5–15)
BUN: 10 mg/dL (ref 8–23)
CO2: 27 mmol/L (ref 22–32)
Calcium: 9.1 mg/dL (ref 8.9–10.3)
Chloride: 104 mmol/L (ref 98–111)
Creatinine: 0.68 mg/dL (ref 0.44–1.00)
GFR, Est AFR Am: >60 mL/min
GFR, Estimated: >60 mL/min
Glucose, Bld: 112 mg/dL — ABNORMAL HIGH (ref 70–99)
Potassium: 3.9 mmol/L (ref 3.5–5.1)
Sodium: 138 mmol/L (ref 135–145)
Total Bilirubin: 0.6 mg/dL (ref 0.3–1.2)
Total Protein: 7.5 g/dL (ref 6.5–8.1)

## 2019-01-27 LAB — TSH: TSH: 1.076 u[IU]/mL (ref 0.308–3.960)

## 2019-01-27 MED ORDER — PROCHLORPERAZINE MALEATE 10 MG PO TABS: 10.0000 mg | ORAL_TABLET | Freq: Four times a day (QID) | ORAL | 0 refills | Status: AC | PRN

## 2019-01-27 MED ORDER — SODIUM CHLORIDE 0.9 % IV SOLN
Freq: Once | INTRAVENOUS | Status: AC
  Administered 2019-01-27: 11:00:00 via INTRAVENOUS
  Filled 2019-01-27: qty 250

## 2019-01-27 MED ORDER — SODIUM CHLORIDE 0.9 % IV SOLN
480.0000 mg | Freq: Once | INTRAVENOUS | Status: AC
  Administered 2019-01-27: 12:00:00 480 mg via INTRAVENOUS
  Filled 2019-01-27: qty 48

## 2019-01-27 NOTE — Progress Notes (Signed)
[place] [phone]   Fax:([phone] [place], [place] [address]  DIAGNOSIS: stage IV(T2b, N3, M1c) non-small cell lung cancer, squamous cell carcinoma.  The patient presented with large left upper lobe lung mass in addition to left hilar and mediastinal lymphadenopathy and left true vocal cord paralysis. PDL 1 expression was 0%  PRIOR THERAPY:  1) Palliative radiotherapy to the large left upper lobe lung mass as well as metastatic brain lesions. 2)  systemic chemotherapy with carboplatin for AUC of 5 and paclitaxel 175 MG/M2 every 3 weeks. First dose on 04/15/2017.  Status post 6 cycles.  Paclitaxel has been reduced to 150 mg/M2 starting from cycle #4 secondary to peripheral neuropathy.  Last dose of chemotherapy was given July 29, 2017. 3) palliative radiotherapy to the enlarging left upper lobe lung mass.  CURRENT THERAPY: Second line treatment with immunotherapy with Nivolumab 480 mg IV every 4 weeks.  First dose of February 25, 2018.  Status post 12 cycles.  INTERVAL HISTORY: Jacqueline Kidd 78 y.o. female returns to the clinic today for follow-up visit.  The patient is feeling fine today with no concerning complaints except for the peripheral neuropathy.  She is currently on gabapentin.  She denied having any significant chest pain but has shortness of breath with exertion with no cough or hemoptysis.  She denied having any fever or chills.  She has no nausea, vomiting, diarrhea or constipation.  She denied having any headache or visual changes.  The patient has no recent weight loss or night sweats.  She has been tolerating her treatment with nivolumab fairly well.  She had repeat CT scan of the chest, abdomen pelvis performed recently and she is here for evaluation and discussion of her scan results.   MEDICAL HISTORY: Past Medical History:  Diagnosis Date   Chronic low back pain      COPD (chronic obstructive pulmonary disease) (HCC)    GERD (gastroesophageal reflux disease)    Hyperlipidemia    Hypertension    Lesion of left lung    lung ca dx'd 01/2017   Osteoporosis     ALLERGIES:  is allergic to fosamax [alendronate]; hydrocodone-acetaminophen; and lisinopril-hydrochlorothiazide.  MEDICATIONS:  Current Outpatient Medications  Medication Sig Dispense Refill   acetaminophen (TYLENOL) 325 MG tablet Take 650 mg by mouth every 6 (six) hours as needed for mild pain or moderate pain.      cholecalciferol (VITAMIN D3) 25 MCG (1000 UT) tablet Take 1,000 Units by mouth daily.     gabapentin (NEURONTIN) 400 MG capsule Take 2 capsules (800 mg total) by mouth at bedtime. 270 capsule 3   LORazepam (ATIVAN) 0.5 MG tablet Take 1 tablet (0.5 mg total) by mouth as needed for anxiety (take one tablet 30 minutes prior to MRI scans and radiation treatment). 10 tablet 0   Omega-3 Fatty Acids (FISH OIL OMEGA-3 PO) Take by mouth.     prochlorperazine (COMPAZINE) 10 MG tablet Take 10 mg by mouth every 6 (six) hours as needed for nausea or vomiting.     Albuterol Sulfate 108 (90 Base) MCG/ACT AEPB Inhale 2 puffs into the lungs every 6 (six) hours as needed. (Patient not taking: Reported on 01/27/2019) 1 each 0   DULoxetine (CYMBALTA) 60 MG capsule Take 1 capsule (60 mg total) by mouth daily. (Patient not taking: Reported on 09/07/2018) 30 capsule 3   No current facility-administered medications for this  visit.     SURGICAL HISTORY:  Past Surgical History:  Procedure Laterality Date   ABDOMINAL HYSTERECTOMY     TOTAL HIP ARTHROPLASTY     right    REVIEW OF SYSTEMS:  Constitutional: positive for fatigue Eyes: negative Ears, nose, mouth, throat, and face: negative Respiratory: positive for dyspnea on exertion Cardiovascular: negative Gastrointestinal: negative Genitourinary:negative Integument/breast: negative Hematologic/lymphatic:  negative Musculoskeletal:negative Neurological: positive for paresthesia Behavioral/Psych: negative Endocrine: negative Allergic/Immunologic: negative   PHYSICAL EXAMINATION: General appearance: alert, cooperative, fatigued and no distress Head: Normocephalic, without obvious abnormality, atraumatic Neck: no adenopathy, no JVD, supple, symmetrical, trachea midline and thyroid not enlarged, symmetric, no tenderness/mass/nodules Lymph nodes: Cervical, supraclavicular, and axillary nodes normal. Resp: wheezes bilaterally Back: symmetric, no curvature. ROM normal. No CVA tenderness. Cardio: regular rate and rhythm, S1, S2 normal, no murmur, click, rub or gallop GI: soft, non-tender; bowel sounds normal; no masses,  no organomegaly Extremities: extremities normal, atraumatic, no cyanosis or edema Neurologic: Alert and oriented X 3, normal strength and tone. Normal symmetric reflexes. Normal coordination and gait  ECOG PERFORMANCE STATUS: 1 - Symptomatic but completely ambulatory  Blood pressure 113/77, pulse (!) 109, temperature 98.6 F (37 C), temperature source Temporal, resp. rate 16, height 5\' 1"  (1.549 m), weight 109 lb 4.8 oz (49.6 kg), SpO2 96 %.  LABORATORY DATA: Lab Results  Component Value Date   WBC 5.8 01/27/2019   HGB 12.0 01/27/2019   HCT 36.5 01/27/2019   MCV 100.6 (H) 01/27/2019   PLT 169 01/27/2019      Chemistry      Component Value Date/Time   NA 138 12/30/2018 0958   NA 138 07/08/2017 0831   K 3.7 12/30/2018 0958   K 3.3 (L) 07/08/2017 0831   CL 105 12/30/2018 0958   CO2 26 12/30/2018 0958   CO2 24 07/08/2017 0831   BUN 9 12/30/2018 0958   BUN 4.5 (L) 07/08/2017 0831   CREATININE 0.68 12/30/2018 0958   CREATININE 0.6 07/08/2017 0831      Component Value Date/Time   CALCIUM 8.9 12/30/2018 0958   CALCIUM 9.0 07/08/2017 0831   ALKPHOS 84 12/30/2018 0958   ALKPHOS 119 07/08/2017 0831   AST 16 12/30/2018 0958   AST 25 07/08/2017 0831   ALT 7 12/30/2018  0958   ALT 16 07/08/2017 0831   BILITOT 0.6 12/30/2018 0958   BILITOT 0.52 07/08/2017 0831       RADIOGRAPHIC STUDIES: Ct Chest W Contrast  Result Date: 01/17/2019 CLINICAL DATA:  Non-small cell lung cancer. Original diagnosis 2018. Immunotherapy ongoing. Completion of chemo radiation therapy. EXAM: CT CHEST, ABDOMEN, AND PELVIS WITH CONTRAST TECHNIQUE: Multidetector CT imaging of the chest, abdomen and pelvis was performed following the standard protocol during bolus administration of intravenous contrast. CONTRAST:  138mL ISOVUE-300 IOPAMIDOL (ISOVUE-300) INJECTION 61% COMPARISON:  CT 11/02/2018, 08/10/2018 FINDINGS: CT CHEST FINDINGS Cardiovascular: No significant vascular findings. Normal heart size. No pericardial effusion. Aberrant RIGHT subclavian artery. Atherosclerotic calcification of the aorta. Mediastinum/Nodes: No axillary supraclavicular adenopathy. No mediastinal adenopathy. The esophagus is somewhat ill-defined along its border which likely relates radiation change. Lungs/Pleura: Again demonstrated collapse of the LEFT upper lobe. The collapsed portion measures approximately 5.1 cm compared to 5.9 cm on prior. The central necrotic portion which measures 2.3 x 1.7 cm compared to 3.7 x 2.1 cm for some retraction. No new lesion evident. There is a moderate LEFT effusion which layers dependently and not changed in volume compared to prior. There is diffuse subpleural reticulation in  LEFT and RIGHT lung. No new suspicious nodularity. Musculoskeletal: No aggressive osseous lesion. CT ABDOMEN AND PELVIS FINDINGS Hepatobiliary: No biliary duct dilatation. Tiny subcapsular lesion in the RIGHT hepatic lobe (image 61/2 is unchanged. No evidence of metastasis Pancreas: No pancreatic inflammation or mass Spleen: Normal spleen Adrenals/urinary tract: Thickening LEFT adrenal gland to 1.4 cm (image 53/2) compares to 1.1 cm on most recent CT scan and 1.2 cm on CT 08/10/2018. RIGHT adrenal glands normal.  Stomach/Bowel: Stomach, small bowel, appendix, and cecum are normal. The colon and rectosigmoid colon are normal. Vascular/Lymphatic: Abdominal aorta is normal caliber with atherosclerotic calcification. There is no retroperitoneal or periportal lymphadenopathy. No pelvic lymphadenopathy. Reproductive: Post hysterectomy Other: No peritoneal metastasis Musculoskeletal: No aggressive osseous lesion. IMPRESSION: Chest Impression: 1. LEFT upper lobe collapse with central necrosis is contracted somewhat compared to prior. 2. Persistent small LEFT effusion. 3. No evidence of metastatic progression in the thorax. Abdomen / Pelvis Impression: 1. Prominent thickening of the LEFT adrenal gland similar to comparison exams. Recommend attention on follow-up. 2. No evidence of liver metastasis. 3. No metastatic adenopathy in the abdomen pelvis. Electronically Signed   By: Suzy Bouchard M.D.   On: 01/17/2019 16:01   Ct Abdomen Pelvis W Contrast  Result Date: 01/17/2019 CLINICAL DATA:  Non-small cell lung cancer. Original diagnosis 2018. Immunotherapy ongoing. Completion of chemo radiation therapy. EXAM: CT CHEST, ABDOMEN, AND PELVIS WITH CONTRAST TECHNIQUE: Multidetector CT imaging of the chest, abdomen and pelvis was performed following the standard protocol during bolus administration of intravenous contrast. CONTRAST:  169mL ISOVUE-300 IOPAMIDOL (ISOVUE-300) INJECTION 61% COMPARISON:  CT 11/02/2018, 08/10/2018 FINDINGS: CT CHEST FINDINGS Cardiovascular: No significant vascular findings. Normal heart size. No pericardial effusion. Aberrant RIGHT subclavian artery. Atherosclerotic calcification of the aorta. Mediastinum/Nodes: No axillary supraclavicular adenopathy. No mediastinal adenopathy. The esophagus is somewhat ill-defined along its border which likely relates radiation change. Lungs/Pleura: Again demonstrated collapse of the LEFT upper lobe. The collapsed portion measures approximately 5.1 cm compared to 5.9 cm on  prior. The central necrotic portion which measures 2.3 x 1.7 cm compared to 3.7 x 2.1 cm for some retraction. No new lesion evident. There is a moderate LEFT effusion which layers dependently and not changed in volume compared to prior. There is diffuse subpleural reticulation in LEFT and RIGHT lung. No new suspicious nodularity. Musculoskeletal: No aggressive osseous lesion. CT ABDOMEN AND PELVIS FINDINGS Hepatobiliary: No biliary duct dilatation. Tiny subcapsular lesion in the RIGHT hepatic lobe (image 61/2 is unchanged. No evidence of metastasis Pancreas: No pancreatic inflammation or mass Spleen: Normal spleen Adrenals/urinary tract: Thickening LEFT adrenal gland to 1.4 cm (image 53/2) compares to 1.1 cm on most recent CT scan and 1.2 cm on CT 08/10/2018. RIGHT adrenal glands normal. Stomach/Bowel: Stomach, small bowel, appendix, and cecum are normal. The colon and rectosigmoid colon are normal. Vascular/Lymphatic: Abdominal aorta is normal caliber with atherosclerotic calcification. There is no retroperitoneal or periportal lymphadenopathy. No pelvic lymphadenopathy. Reproductive: Post hysterectomy Other: No peritoneal metastasis Musculoskeletal: No aggressive osseous lesion. IMPRESSION: Chest Impression: 1. LEFT upper lobe collapse with central necrosis is contracted somewhat compared to prior. 2. Persistent small LEFT effusion. 3. No evidence of metastatic progression in the thorax. Abdomen / Pelvis Impression: 1. Prominent thickening of the LEFT adrenal gland similar to comparison exams. Recommend attention on follow-up. 2. No evidence of liver metastasis. 3. No metastatic adenopathy in the abdomen pelvis. Electronically Signed   By: Suzy Bouchard M.D.   On: 01/17/2019 16:01    ASSESSMENT AND PLAN:  This is a very pleasant 78 years old white female with stage IV (T2b, N3, M1 C) non-small cell lung cancer, squamous cell carcinoma presented with large left upper lobe lung mass in addition to mediastinal  and supraclavicular lymphadenopathy as well as metastatic brain lesions diagnosed in September 2018. The patient underwent a course of palliative radiotherapy to the left upper lobe lung mass as well as the metastatic brain lesions. She is currently undergoing palliative systemic chemotherapy with carboplatin for AUC of 5 and paclitaxel 175 mg/M2 every 3 weeks, status post 6 cycles. She has been in observation for several months. Repeat CT scan of the chest, abdomen and pelvis showed evidence for disease progression with enlargement of right adrenal nodule in addition to progressive enlargement of small left pleural effusion in addition to the right parietal scalp lesion.  The patient also was found to have evidence for progressive disease in the brain. She was started on second line treatment with immunotherapy with Nivolumab 480 mg IV every 4 weeks status post 12  cycles.  The patient continues to tolerate this treatment well with no concerning adverse effects. Restaging CT scan of the chest, abdomen pelvis after cycle #12 showed stable disease with no concerning findings for progression. I recommended for the patient to continue her current treatment with nivolumab every 4 weeks and she will proceed with cycle #13 today. For the intermittent nausea I gave her refill for Compazine. For the peripheral neuropathy she will continue on gabapentin. For the depression, lack of appetite and weight loss, she will continue her treatment with Remeron 15 mg p.o. nightly. She will come back for follow-up visit in 4 weeks for evaluation before the next cycle of her treatment. She was advised to call immediately if she has any concerning symptoms in the interval. The patient voices understanding of current disease status and treatment options and is in agreement with the current care plan. All questions were answered. The patient knows to call the clinic with any problems, questions or concerns. We can certainly  see the patient much sooner if necessary.  Disclaimer: This note was dictated with voice recognition software. Similar sounding words can inadvertently be transcribed and may not be corrected upon review.

## 2019-01-27 NOTE — Patient Instructions (Signed)
[facility] Discharge Instructions for Patients Receiving Chemotherapy  Today you received the following chemotherapy agents: Nivolumab  To help prevent nausea and vomiting after your treatment, we encourage you to take your nausea medication as directed.    If you develop nausea and vomiting that is not controlled by your nausea medication, call the clinic.   BELOW ARE SYMPTOMS THAT SHOULD BE REPORTED IMMEDIATELY:  *FEVER GREATER THAN 100.5 F  *CHILLS WITH OR WITHOUT FEVER  NAUSEA AND VOMITING THAT IS NOT CONTROLLED WITH YOUR NAUSEA MEDICATION  *UNUSUAL SHORTNESS OF BREATH  *UNUSUAL BRUISING OR BLEEDING  TENDERNESS IN MOUTH AND THROAT WITH OR WITHOUT PRESENCE OF ULCERS  *URINARY PROBLEMS  *BOWEL PROBLEMS  UNUSUAL RASH Items with * indicate a potential emergency and should be followed up as soon as possible.  Feel free to call the clinic should you have any questions or concerns. The clinic phone number is [phone].  Please show the CHEMO ALERT CARD at check-in to the Emergency Department and triage nurse.   

## 2019-01-27 NOTE — Telephone Encounter (Signed)
Patient has MRI of brain tomorrow and follow up next Tuesday.  Wanted to know if she could have her follow up visit to review results via phone.  Routed to Dr. Mickeal Skinner for advisement.

## 2019-01-28 ENCOUNTER — Encounter: Payer: Self-pay | Admitting: Internal Medicine

## 2019-01-28 ENCOUNTER — Ambulatory Visit
Admission: RE | Admit: 2019-01-28 | Discharge: 2019-01-28 | Disposition: A | Discharge status: Home / Self Care | Payer: Medicare Other | Source: Ambulatory Visit | Attending: Internal Medicine | Admitting: Internal Medicine

## 2019-01-28 DIAGNOSIS — C7931 Secondary malignant neoplasm of brain: Secondary | ICD-10-CM | POA: Diagnosis not present

## 2019-01-28 DIAGNOSIS — C3490 Malignant neoplasm of unspecified part of unspecified bronchus or lung: Secondary | ICD-10-CM | POA: Diagnosis not present

## 2019-01-28 DIAGNOSIS — C719 Malignant neoplasm of brain, unspecified: Secondary | ICD-10-CM

## 2019-01-28 MED ORDER — GADOBENATE DIMEGLUMINE 529 MG/ML IV SOLN
10.0000 mL | Freq: Once | INTRAVENOUS | Status: AC | PRN
  Administered 2019-01-28: 10 mL via INTRAVENOUS

## 2019-02-01 ENCOUNTER — Inpatient Hospital Stay (HOSPITAL_BASED_OUTPATIENT_CLINIC_OR_DEPARTMENT_OTHER): Payer: Medicare Other | Admitting: Internal Medicine

## 2019-02-01 DIAGNOSIS — C7931 Secondary malignant neoplasm of brain: Secondary | ICD-10-CM

## 2019-02-01 NOTE — Progress Notes (Signed)
I connected with Jacqueline Kidd on 02/01/19 at 11:30 AM EDT by telephone visit and verified that I am speaking with the correct person using two identifiers.  I discussed the limitations, risks, security and privacy concerns of performing an evaluation and management service by telemedicine and the availability of in-person appointments. I also discussed with the patient that there may be a patient responsible charge related to this service. The patient expressed understanding and agreed to proceed.  Other persons participating in the visit and their role in the encounter:  n/a   Patient's location:  Home   Provider's location:  Office  Chief Complaint:  Brain Metastases  History of Present Ilness: Denies new or progressive neurologic symptoms.  No seizures or headaches Observations: Cognition and memory as prior.  No dysarthria  Imaging:  [facility] Clinician Interpretation: I have personally reviewed the CNS images as listed.  My interpretation, in the context of the patient's clinical presentation, is likely treatment effect  Ct Chest W Contrast  Result Date: 01/17/2019 CLINICAL DATA:  Non-small cell lung cancer. Original diagnosis 2018. Immunotherapy ongoing. Completion of chemo radiation therapy. EXAM: CT CHEST, ABDOMEN, AND PELVIS WITH CONTRAST TECHNIQUE: Multidetector CT imaging of the chest, abdomen and pelvis was performed following the standard protocol during bolus administration of intravenous contrast. CONTRAST:  170mL ISOVUE-300 IOPAMIDOL (ISOVUE-300) INJECTION 61% COMPARISON:  CT 11/02/2018, 08/10/2018 FINDINGS: CT CHEST FINDINGS Cardiovascular: No significant vascular findings. Normal heart size. No pericardial effusion. Aberrant RIGHT subclavian artery. Atherosclerotic calcification of the aorta. Mediastinum/Nodes: No axillary supraclavicular adenopathy. No mediastinal adenopathy. The esophagus is somewhat ill-defined along its border which likely relates radiation change. Lungs/Pleura:  Again demonstrated collapse of the LEFT upper lobe. The collapsed portion measures approximately 5.1 cm compared to 5.9 cm on prior. The central necrotic portion which measures 2.3 x 1.7 cm compared to 3.7 x 2.1 cm for some retraction. No new lesion evident. There is a moderate LEFT effusion which layers dependently and not changed in volume compared to prior. There is diffuse subpleural reticulation in LEFT and RIGHT lung. No new suspicious nodularity. Musculoskeletal: No aggressive osseous lesion. CT ABDOMEN AND PELVIS FINDINGS Hepatobiliary: No biliary duct dilatation. Tiny subcapsular lesion in the RIGHT hepatic lobe (image 61/2 is unchanged. No evidence of metastasis Pancreas: No pancreatic inflammation or mass Spleen: Normal spleen Adrenals/urinary tract: Thickening LEFT adrenal gland to 1.4 cm (image 53/2) compares to 1.1 cm on most recent CT scan and 1.2 cm on CT 08/10/2018. RIGHT adrenal glands normal. Stomach/Bowel: Stomach, small bowel, appendix, and cecum are normal. The colon and rectosigmoid colon are normal. Vascular/Lymphatic: Abdominal aorta is normal caliber with atherosclerotic calcification. There is no retroperitoneal or periportal lymphadenopathy. No pelvic lymphadenopathy. Reproductive: Post hysterectomy Other: No peritoneal metastasis Musculoskeletal: No aggressive osseous lesion. IMPRESSION: Chest Impression: 1. LEFT upper lobe collapse with central necrosis is contracted somewhat compared to prior. 2. Persistent small LEFT effusion. 3. No evidence of metastatic progression in the thorax. Abdomen / Pelvis Impression: 1. Prominent thickening of the LEFT adrenal gland similar to comparison exams. Recommend attention on follow-up. 2. No evidence of liver metastasis. 3. No metastatic adenopathy in the abdomen pelvis. Electronically Signed   By: Suzy Bouchard M.D.   On: 01/17/2019 16:01   Mr Jacqueline Kidd VH Contrast  Result Date: 01/28/2019 CLINICAL DATA:  Lung cancer with brain metastases  previously treated with SRS. EXAM: MRI HEAD WITHOUT AND WITH CONTRAST TECHNIQUE: Multiplanar, multiecho pulse sequences of the brain and surrounding structures were obtained without and  with intravenous contrast. CONTRAST:  48mL MULTIHANCE GADOBENATE DIMEGLUMINE 529 MG/ML IV SOLN COMPARISON:  MRI brain 10/22/2018 and 07/23/2018 FINDINGS: BRAIN New Lesions: None. Larger lesions: 5. Enhancing lesion located left occipital pole at the posterior horn of the left lateral ventricle has increased from 9 x 8 x 9 mm to now 15 x 12 x 12 mm, with new or increased edema and mass effect seen on 11, 53. Enhancing lesion located right temporal lobe has increased from 9 x 7 mm to now 11 x 6 mm, without new or increased edema and mass effect seen on 11, 57. Enhancing lesion located right occipital lobe along the posterior horn of the right lateral ventricle has increased from 6.5 mm to now 8.5 mm, with new or increased edema and mass effect seen on treated. Enhancing lesion located right occipital lobe has increased from 3.6 mm to now 7.0 mm, with new or increased edema and mass effect seen on 11, 81. Enhancing lesion located in the posterior left insula has increased from 9 mm to now 11 mm, without new or increased edema and mass effect seen on 11, 84. Stable or Smaller lesions: 1 Six mm enhancing lesion located left temporal lobe and seen on 11, 70. Other Brain findings: Moderate atrophy and diffuse confluent periventricular white matter changes are again seen bilaterally. Ventricular enlargement is stable. No significant extra-axial fluid collection is present. Chronic white matter changes extend into the brainstem. Vascular: Flow is present in the major intracranial arteries. Skull and upper cervical spine: The craniocervical junction is normal. Upper cervical spine is within normal limits. Marrow signal is unremarkable. Sinuses/Orbits: The paranasal sinuses and mastoid air cells are clear. IMPRESSION: 1. Progression of, Stable,  Regression of disease as evidenced by progressive size of multiple previously treated lesions. 2. Total of 6 enhancing brain metastases, each annotated on 11. 3. Stable chronic atrophy and diffuse white matter disease. This likely represents a combination of prior radiation therapy and microvascular ischemia 4. Remote lacunar infarcts of the left thalamus and basal ganglia. Electronically Signed   By: San Morelle M.D.   On: 01/28/2019 20:45   Ct Abdomen Pelvis W Contrast  Result Date: 01/17/2019 CLINICAL DATA:  Non-small cell lung cancer. Original diagnosis 2018. Immunotherapy ongoing. Completion of chemo radiation therapy. EXAM: CT CHEST, ABDOMEN, AND PELVIS WITH CONTRAST TECHNIQUE: Multidetector CT imaging of the chest, abdomen and pelvis was performed following the standard protocol during bolus administration of intravenous contrast. CONTRAST:  115mL ISOVUE-300 IOPAMIDOL (ISOVUE-300) INJECTION 61% COMPARISON:  CT 11/02/2018, 08/10/2018 FINDINGS: CT CHEST FINDINGS Cardiovascular: No significant vascular findings. Normal heart size. No pericardial effusion. Aberrant RIGHT subclavian artery. Atherosclerotic calcification of the aorta. Mediastinum/Nodes: No axillary supraclavicular adenopathy. No mediastinal adenopathy. The esophagus is somewhat ill-defined along its border which likely relates radiation change. Lungs/Pleura: Again demonstrated collapse of the LEFT upper lobe. The collapsed portion measures approximately 5.1 cm compared to 5.9 cm on prior. The central necrotic portion which measures 2.3 x 1.7 cm compared to 3.7 x 2.1 cm for some retraction. No new lesion evident. There is a moderate LEFT effusion which layers dependently and not changed in volume compared to prior. There is diffuse subpleural reticulation in LEFT and RIGHT lung. No new suspicious nodularity. Musculoskeletal: No aggressive osseous lesion. CT ABDOMEN AND PELVIS FINDINGS Hepatobiliary: No biliary duct dilatation. Tiny  subcapsular lesion in the RIGHT hepatic lobe (image 61/2 is unchanged. No evidence of metastasis Pancreas: No pancreatic inflammation or mass Spleen: Normal spleen Adrenals/urinary tract: Thickening LEFT  adrenal gland to 1.4 cm (image 53/2) compares to 1.1 cm on most recent CT scan and 1.2 cm on CT 08/10/2018. RIGHT adrenal glands normal. Stomach/Bowel: Stomach, small bowel, appendix, and cecum are normal. The colon and rectosigmoid colon are normal. Vascular/Lymphatic: Abdominal aorta is normal caliber with atherosclerotic calcification. There is no retroperitoneal or periportal lymphadenopathy. No pelvic lymphadenopathy. Reproductive: Post hysterectomy Other: No peritoneal metastasis Musculoskeletal: No aggressive osseous lesion. IMPRESSION: Chest Impression: 1. LEFT upper lobe collapse with central necrosis is contracted somewhat compared to prior. 2. Persistent small LEFT effusion. 3. No evidence of metastatic progression in the thorax. Abdomen / Pelvis Impression: 1. Prominent thickening of the LEFT adrenal gland similar to comparison exams. Recommend attention on follow-up. 2. No evidence of liver metastasis. 3. No metastatic adenopathy in the abdomen pelvis. Electronically Signed   By: Suzy Bouchard M.D.   On: 01/17/2019 16:01    Assessment and Plan: 5 of 9 treated metastases demonstrate subtle interval growth, likely etiology is post-RT inflammation Follow Up Instructions: Will review case in brain/spine tumor board tomorrow. Repeat Brain MRI and follow up in 3 months or sooner as needed.  I discussed the assessment and treatment plan with the patient.  The patient was provided an opportunity to ask questions and all were answered.  The patient agreed with the plan and demonstrated understanding of the instructions.    The patient was advised to call back or seek an in-person evaluation if the symptoms worsen or if the condition fails to improve as anticipated.  I provided 5-10 minutes of  non-face-to-face time during this enocunter.  Ventura Sellers, MD   I provided 10 minutes of non face-to-face telephone visit time during this encounter, and > 50% was spent counseling as documented under my assessment & plan.

## 2019-02-02 ENCOUNTER — Inpatient Hospital Stay: Payer: Medicare Other

## 2019-02-02 ENCOUNTER — Telehealth: Payer: Self-pay | Admitting: Internal Medicine

## 2019-02-02 NOTE — Telephone Encounter (Signed)
Scheduled appt per 7/28 los.  Patient will get a print out at her next appt.

## 2019-02-10 ENCOUNTER — Emergency Department (HOSPITAL_COMMUNITY): Payer: Medicare Other

## 2019-02-10 ENCOUNTER — Encounter (HOSPITAL_COMMUNITY): Payer: Self-pay | Admitting: Emergency Medicine

## 2019-02-10 ENCOUNTER — Inpatient Hospital Stay (HOSPITAL_COMMUNITY)
Admission: EM | Admit: 2019-02-10 | Discharge: 2019-02-12 | DRG: 071 | Disposition: A | Discharge status: Home Health | Payer: Medicare Other | Attending: Student | Admitting: Student

## 2019-02-10 ENCOUNTER — Other Ambulatory Visit: Payer: Self-pay

## 2019-02-10 ENCOUNTER — Observation Stay (HOSPITAL_COMMUNITY): Payer: Medicare Other

## 2019-02-10 DIAGNOSIS — E871 Hypo-osmolality and hyponatremia: Secondary | ICD-10-CM | POA: Diagnosis present

## 2019-02-10 DIAGNOSIS — C3490 Malignant neoplasm of unspecified part of unspecified bronchus or lung: Secondary | ICD-10-CM | POA: Diagnosis not present

## 2019-02-10 DIAGNOSIS — Z885 Allergy status to narcotic agent status: Secondary | ICD-10-CM

## 2019-02-10 DIAGNOSIS — I10 Essential (primary) hypertension: Secondary | ICD-10-CM | POA: Diagnosis present

## 2019-02-10 DIAGNOSIS — B370 Candidal stomatitis: Secondary | ICD-10-CM | POA: Diagnosis present

## 2019-02-10 DIAGNOSIS — J449 Chronic obstructive pulmonary disease, unspecified: Secondary | ICD-10-CM | POA: Diagnosis present

## 2019-02-10 DIAGNOSIS — R197 Diarrhea, unspecified: Secondary | ICD-10-CM | POA: Diagnosis present

## 2019-02-10 DIAGNOSIS — E785 Hyperlipidemia, unspecified: Secondary | ICD-10-CM | POA: Diagnosis present

## 2019-02-10 DIAGNOSIS — Z96641 Presence of right artificial hip joint: Secondary | ICD-10-CM | POA: Diagnosis present

## 2019-02-10 DIAGNOSIS — C7931 Secondary malignant neoplasm of brain: Secondary | ICD-10-CM | POA: Diagnosis not present

## 2019-02-10 DIAGNOSIS — Z85118 Personal history of other malignant neoplasm of bronchus and lung: Secondary | ICD-10-CM

## 2019-02-10 DIAGNOSIS — Z87891 Personal history of nicotine dependence: Secondary | ICD-10-CM

## 2019-02-10 DIAGNOSIS — Z888 Allergy status to other drugs, medicaments and biological substances status: Secondary | ICD-10-CM | POA: Diagnosis not present

## 2019-02-10 DIAGNOSIS — K219 Gastro-esophageal reflux disease without esophagitis: Secondary | ICD-10-CM | POA: Diagnosis present

## 2019-02-10 DIAGNOSIS — C3412 Malignant neoplasm of upper lobe, left bronchus or lung: Secondary | ICD-10-CM | POA: Diagnosis not present

## 2019-02-10 DIAGNOSIS — R4182 Altered mental status, unspecified: Secondary | ICD-10-CM | POA: Diagnosis present

## 2019-02-10 DIAGNOSIS — R0689 Other abnormalities of breathing: Secondary | ICD-10-CM | POA: Diagnosis not present

## 2019-02-10 DIAGNOSIS — M810 Age-related osteoporosis without current pathological fracture: Secondary | ICD-10-CM | POA: Diagnosis not present

## 2019-02-10 DIAGNOSIS — R4020 Unspecified coma: Secondary | ICD-10-CM | POA: Diagnosis not present

## 2019-02-10 DIAGNOSIS — M545 Low back pain: Secondary | ICD-10-CM | POA: Diagnosis not present

## 2019-02-10 DIAGNOSIS — R5381 Other malaise: Secondary | ICD-10-CM | POA: Diagnosis not present

## 2019-02-10 DIAGNOSIS — G9341 Metabolic encephalopathy: Principal | ICD-10-CM | POA: Diagnosis present

## 2019-02-10 DIAGNOSIS — T451X5A Adverse effect of antineoplastic and immunosuppressive drugs, initial encounter: Secondary | ICD-10-CM | POA: Diagnosis present

## 2019-02-10 DIAGNOSIS — Z923 Personal history of irradiation: Secondary | ICD-10-CM | POA: Diagnosis not present

## 2019-02-10 DIAGNOSIS — R000 Tachycardia, unspecified: Secondary | ICD-10-CM | POA: Diagnosis not present

## 2019-02-10 DIAGNOSIS — I700 Atherosclerosis of aorta: Secondary | ICD-10-CM | POA: Diagnosis not present

## 2019-02-10 DIAGNOSIS — E860 Dehydration: Secondary | ICD-10-CM | POA: Diagnosis not present

## 2019-02-10 DIAGNOSIS — Z20828 Contact with and (suspected) exposure to other viral communicable diseases: Secondary | ICD-10-CM | POA: Diagnosis present

## 2019-02-10 DIAGNOSIS — Z79899 Other long term (current) drug therapy: Secondary | ICD-10-CM | POA: Diagnosis not present

## 2019-02-10 DIAGNOSIS — R531 Weakness: Secondary | ICD-10-CM | POA: Diagnosis not present

## 2019-02-10 DIAGNOSIS — G8929 Other chronic pain: Secondary | ICD-10-CM | POA: Diagnosis not present

## 2019-02-10 LAB — URINALYSIS, COMPLETE (UACMP) WITH MICROSCOPIC
Bilirubin Urine: NEGATIVE
Glucose, UA: NEGATIVE mg/dL
Hgb urine dipstick: NEGATIVE
Ketones, ur: NEGATIVE mg/dL
Leukocytes,Ua: NEGATIVE
Nitrite: NEGATIVE
Protein, ur: NEGATIVE mg/dL
Specific Gravity, Urine: 1.013 (ref 1.005–1.030)
pH: 7 (ref 5.0–8.0)

## 2019-02-10 LAB — CBC WITH DIFFERENTIAL/PLATELET
Abs Immature Granulocytes: 0.03 10*3/uL (ref 0.00–0.07)
Basophils Absolute: 0 10*3/uL (ref 0.0–0.1)
Basophils Relative: 0 %
Eosinophils Absolute: 0 10*3/uL (ref 0.0–0.5)
Eosinophils Relative: 1 %
HCT: 35.1 % — ABNORMAL LOW (ref 36.0–46.0)
Hemoglobin: 11.2 g/dL — ABNORMAL LOW (ref 12.0–15.0)
Immature Granulocytes: 1 %
Lymphocytes Relative: 14 %
Lymphs Abs: 0.9 10*3/uL (ref 0.7–4.0)
MCH: 32.7 pg (ref 26.0–34.0)
MCHC: 31.9 g/dL (ref 30.0–36.0)
MCV: 102.6 fL — ABNORMAL HIGH (ref 80.0–100.0)
Monocytes Absolute: 0.6 10*3/uL (ref 0.1–1.0)
Monocytes Relative: 10 %
Neutro Abs: 4.7 10*3/uL (ref 1.7–7.7)
Neutrophils Relative %: 74 %
Platelets: 147 10*3/uL — ABNORMAL LOW (ref 150–400)
RBC: 3.42 MIL/uL — ABNORMAL LOW (ref 3.87–5.11)
RDW: 13.5 % (ref 11.5–15.5)
WBC: 6.3 10*3/uL (ref 4.0–10.5)
nRBC: 0 % (ref 0.0–0.2)

## 2019-02-10 LAB — COMPREHENSIVE METABOLIC PANEL
ALT: 13 U/L (ref 0–44)
AST: 18 U/L (ref 15–41)
Albumin: 3.1 g/dL — ABNORMAL LOW (ref 3.5–5.0)
Alkaline Phosphatase: 76 U/L (ref 38–126)
Anion gap: 6 (ref 5–15)
BUN: 10 mg/dL (ref 8–23)
CO2: 27 mmol/L (ref 22–32)
Calcium: 8.4 mg/dL — ABNORMAL LOW (ref 8.9–10.3)
Chloride: 101 mmol/L (ref 98–111)
Creatinine, Ser: 0.53 mg/dL (ref 0.44–1.00)
GFR calc Af Amer: >60 mL/min (ref >60)
GFR calc non Af Amer: >60 mL/min (ref >60)
Glucose, Bld: 106 mg/dL — ABNORMAL HIGH (ref 70–99)
Potassium: 3.5 mmol/L (ref 3.5–5.1)
Sodium: 134 mmol/L — ABNORMAL LOW (ref 135–145)
Total Bilirubin: 0.6 mg/dL (ref 0.3–1.2)
Total Protein: 7 g/dL (ref 6.5–8.1)

## 2019-02-10 LAB — RAPID URINE DRUG SCREEN, HOSP PERFORMED
Amphetamines: NOT DETECTED
Barbiturates: NOT DETECTED
Benzodiazepines: NOT DETECTED
Cocaine: NOT DETECTED
Opiates: NOT DETECTED
Tetrahydrocannabinol: NOT DETECTED

## 2019-02-10 LAB — LACTIC ACID, PLASMA: Lactic Acid, Venous: 1.1 mmol/L (ref 0.5–1.9)

## 2019-02-10 LAB — AMMONIA: Ammonia: <9 umol/L — ABNORMAL LOW (ref 9–35)

## 2019-02-10 LAB — ETHANOL: Alcohol, Ethyl (B): <10 mg/dL (ref <10)

## 2019-02-10 LAB — MAGNESIUM: Magnesium: 2 mg/dL (ref 1.7–2.4)

## 2019-02-10 LAB — SARS CORONAVIRUS 2 BY RT PCR (HOSPITAL ORDER, PERFORMED IN ~~LOC~~ HOSPITAL LAB): SARS Coronavirus 2: NEGATIVE

## 2019-02-10 LAB — CBG MONITORING, ED: Glucose-Capillary: 95 mg/dL (ref 70–99)

## 2019-02-10 MED ORDER — POTASSIUM CHLORIDE IN NACL 20-0.9 MEQ/L-% IV SOLN
INTRAVENOUS | Status: AC
  Administered 2019-02-10 – 2019-02-11 (×3): via INTRAVENOUS
  Filled 2019-02-10 (×4): qty 1000

## 2019-02-10 MED ORDER — PROCHLORPERAZINE EDISYLATE 10 MG/2ML IJ SOLN
10.0000 mg | Freq: Once | INTRAMUSCULAR | Status: AC
  Administered 2019-02-10: 10 mg via INTRAVENOUS
  Filled 2019-02-10: qty 2

## 2019-02-10 MED ORDER — SODIUM CHLORIDE 0.9 % IV BOLUS
1000.0000 mL | Freq: Once | INTRAVENOUS | Status: AC
  Administered 2019-02-10: 21:00:00 1000 mL via INTRAVENOUS

## 2019-02-10 NOTE — H&P (Signed)
History and Physical    Jacqueline Kidd:[id number] DOB: May 17, 1941 DOA: 02/10/2019  PCP: Curt Bears, MD   Patient coming from: Home  I have personally briefly reviewed patient's old medical records in [place]  Chief Complaint: Diarrhea, confusion and lethargy  HPI: Jacqueline Kidd is a 78 y.o. female with medical history significant for hypertension, lung mass with brain metastases, COPD, osteoporosis, was brought to the ED with complaints of diarrhea and weakness for 2 days and confusion and lethargy of about 1 week. At the time of my evaluation patient appeared lethargic but was awake and she was answering questions, and oriented x 3.  She denies significant diarrhea, and tells me she was not confused.  She tells me she came to the ED because she was dehydrated and has lung cancer.  Denies difficulty breathing or cough denies vomiting no abdominal pain, denies blood pain with urination, denies fever or chills.   I talked to patient spouse Fritz Pickerel, he denies significant diarrhea, tells me patient has been having 1 or 2 loose bowel movements every day or on alternating days.  He denies vomiting.  No change in difficulty breathing or cough.  No fever no chills.  He reports patient has not been eating or drinking much, she has not been ambulating-probably from weakness, also he thinks he is a bit confused over the past week.  ED Course: Heart rate 90s to 100, tachypneic 31, systolic blood pressure 867-672C, O2 sats > 94% on room air.  Potassium 3.5, creatinine 0.53, unremarkable CBC, ammonia, alcohol level.  Lactic acid 1.1.  Negative for acute abnormality, stable to mildly increased dilatation of the ventricles, multiple brain metastatic lesions previously demonstrated by MRI. (See detailed report). EDP talked to Dr. Delton Coombes who recommended admission to [facility] long for continued T of care with her oncologist.  Patient's name was incidentally on Dr. 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   Review of Systems: As per HPI all other systems reviewed and negative.  Past Medical History:  Diagnosis Date   Chronic low back pain    COPD (chronic obstructive pulmonary disease) (HCC)    GERD (gastroesophageal reflux disease)    Hyperlipidemia    Hypertension    Lesion of left lung    lung ca dx'd 01/2017   Osteoporosis     Past Surgical History:  Procedure Laterality Date   ABDOMINAL HYSTERECTOMY     TOTAL HIP ARTHROPLASTY     right     reports that she quit smoking about 1 years ago. She has a 57.00 pack-year smoking history. She has never used smokeless tobacco. She reports that she does not drink alcohol or use drugs.  Allergies  Allergen Reactions   Fosamax [Alendronate]    Hydrocodone-Acetaminophen Nausea And Vomiting    EXTREME VOMITING    Lisinopril-Hydrochlorothiazide Other (See Comments)    Chest spasms    Family History  Problem Relation Age of Onset   Cancer Mother        breast   Cancer Father        esophageal   Cancer Sister        breast    Prior to Admission medications   Medication Sig Start Date End Date Taking? Authorizing Provider  acetaminophen (TYLENOL) 325 MG tablet Take 325  mg by mouth every 6 (six) hours as needed for mild pain or moderate pain.    Yes [provider]  Albuterol Sulfate 108 (90 Base) MCG/ACT AEPB Inhale 2 puffs into the lungs every 6 (six) hours as needed. 09/28/17  Yes [name], MD  cholecalciferol (VITAMIN D3) 25 MCG (1000 UT) tablet Take 1,000 Units by mouth daily.   Yes [provider]  DULoxetine (CYMBALTA) 60 MG capsule Take 1 capsule (60 mg total) by mouth daily. 06/04/18  Yes Vaslow, Acey Lav, MD  gabapentin (NEURONTIN) 400 MG capsule Take 2 capsules (800 mg  total) by mouth at bedtime. 07/27/18  Yes Vaslow, Acey Lav, MD  LORazepam (ATIVAN) 0.5 MG tablet Take 1 tablet (0.5 mg total) by mouth as needed for anxiety (take one tablet 30 minutes prior to MRI scans and radiation treatment). 10/13/17  Yes Bruning, Ashlyn, PA-C  NIVOLUMAB IV Inject 1 Dose into the vein every 28 (twenty-eight) days.   Yes [provider]  Omega-3 Fatty Acids (FISH OIL OMEGA-3 PO) Take 1 capsule by mouth daily.    Yes [provider]  prochlorperazine (COMPAZINE) 10 MG tablet Take 1 tablet (10 mg total) by mouth every 6 (six) hours as needed for nausea or vomiting. 01/27/19  Yes Curt Bears, MD    Physical Exam: Vitals:   02/10/19 1426 02/10/19 1430 02/10/19 1500 02/10/19 1900  BP:  132/78 115/66 126/70  Pulse:  100 90 92  Resp:   (!) 31 (!) 30  SpO2:  97% 97% 94%  Weight: 43.1 kg     Height: 5\' 1"  (1.549 m)       Constitutional: Chronically ill-appearing, lethargic, patient appears very weak and dehydrated.  Calm, comfortable Vitals:   02/10/19 1426 02/10/19 1430 02/10/19 1500 02/10/19 1900  BP:  132/78 115/66 126/70  Pulse:  100 90 92  Resp:   (!) 31 (!) 30  SpO2:  97% 97% 94%  Weight: 43.1 kg     Height: 5\' 1"  (1.549 m)      Eyes: PERRL, lids and conjunctivae normal ENMT: Mucous membranes are clearly dry with whitish coating. Neck: normal, supple, no masses, no thyromegaly Respiratory: clear to auscultation bilaterally, no wheezing, no crackles. Normal respiratory effort. No accessory muscle use.  Cardiovascular: Regular rate and rhythm, no murmurs / rubs / gallops. No extremity edema. 2+ pedal pulses.   Abdomen: no tenderness, no masses palpated. No hepatosplenomegaly. Bowel sounds positive.  Musculoskeletal: no clubbing / cyanosis. No joint deformity upper and lower extremities. Good ROM, no contractures. Normal muscle tone.  Skin: no rashes, lesions, ulcers. No induration Neurologic: CN 2-12 grossly intact.  Strength 4/5 in all  extremities.  Obvious generalized weakness. Psychiatric: Normal judgment and insight.  Lethargic but awake and oriented x 3, able to tell me her name, name of her oncologist, where she is in time of year, normal mood.   Labs on Admission: I have personally reviewed following labs and imaging studies  CBC: Recent Labs  Lab 02/10/19 1537  WBC 6.3  NEUTROABS 4.7  HGB 11.2*  HCT 35.1*  MCV 102.6*  PLT 478*   Basic Metabolic Panel: Recent Labs  Lab 02/10/19 1537  NA 134*  K 3.5  CL 101  CO2 27  GLUCOSE 106*  BUN 10  CREATININE 0.53  CALCIUM 8.4*  MG 2.0   Liver Function Tests: Recent Labs  Lab 02/10/19 1537  AST 18  ALT 13  ALKPHOS 76  BILITOT 0.6  PROT 7.0  ALBUMIN 3.1*    Recent Labs  Lab 02/10/19 1537  AMMONIA <9*   CBG: Recent Labs  Lab 02/10/19 1730  GLUCAP 95   Urine analysis:    Component Value Date/Time   COLORURINE YELLOW 02/10/2019 1519   APPEARANCEUR CLOUDY (A) 02/10/2019 1519   APPEARANCEUR Clear 07/04/2016 1620   LABSPEC 1.013 02/10/2019 1519   PHURINE 7.0 02/10/2019 1519   GLUCOSEU NEGATIVE 02/10/2019 1519   HGBUR NEGATIVE 02/10/2019 1519   BILIRUBINUR NEGATIVE 02/10/2019 1519   BILIRUBINUR Negative 07/04/2016 1620   KETONESUR NEGATIVE 02/10/2019 1519   PROTEINUR NEGATIVE 02/10/2019 1519   NITRITE NEGATIVE 02/10/2019 1519   LEUKOCYTESUR NEGATIVE 02/10/2019 1519    Radiological Exams on Admission: Ct Head Wo Contrast  Result Date: 02/10/2019 CLINICAL DATA:  Altered level of consciousness. EXAM: CT HEAD WITHOUT CONTRAST TECHNIQUE: Contiguous axial images were obtained from the base of the skull through the vertex without intravenous contrast. COMPARISON:  Brain MRI January 28, 2019 FINDINGS: Brain: No evidence of acute infarction, or hemorrhage. Stable to mildly increased dilation of the ventricles, accounting for cross modality comparison. Multiple brain metastatic lesions previously demonstrated by MRI are poorly seen. Moderate brain  parenchymal volume loss and deep white matter areas of hypoattenuation, which in correlation to recent brain MRI were thought to represent combination of sequela of radiation therapy and microvascular ischemia. Stable sequela of prior lacunar left thalamic infarct. Vascular: Intracranial calcific atherosclerotic disease. Skull: Normal. Negative for fracture or focal lesion. Sinuses/Orbits: No acute finding. Other: None. IMPRESSION: 1. No acute intracranial abnormality. 2. Stable to mildly increased dilation of the ventricles. 3. Multiple brain metastatic lesions previously demonstrated by MRI are poorly seen. 4. Moderate brain parenchymal volume loss and deep white matter areas of hypoattenuation, which in correlation to recent brain MRI were thought to represent combination of sequela of radiation therapy and microvascular ischemia. Electronically Signed   By: Fidela Salisbury M.D.   On: 02/10/2019 16:30   Dg Chest Port 1 View  Result Date: 02/10/2019 CLINICAL DATA:  Acute metabolic encephalopathy. Confusion. EXAM: PORTABLE CHEST 1 VIEW COMPARISON:  CT scan of the chest dated 01/17/2019 FINDINGS: The heart size and pulmonary vascularity are normal. Aortic atherosclerosis. There is chronic consolidation and volume loss in the left upper lobe at the site of the known non-small cell carcinoma. There is also chronic elevation of the left hemidiaphragm. Right lung is clear. No acute bone abnormality. Old healed fracture of the proximal right humerus. IMPRESSION: 1. Chronic consolidation and volume loss in the left upper lobe at the site of the known non-small cell carcinoma. Chronic elevation of the left hemidiaphragm. 2. No acute abnormalities. 3. Aortic atherosclerosis. Electronically Signed   By: Lorriane Shire M.D.   On: 02/10/2019 19:49    EKG: Independently reviewed.  Sinus rhythm.  QTc 452.  Slight ST elevation in 2 3 aVF but not in pattern of MI.  Assessment/Plan Active Problems:   Acute metabolic  encephalopathy   Acute metabolic encephalopathy- patient appears dehydrated clinically, but creatinine is at baseline without lactic acidosis or electrolyte abnormality.  WBC 6.3.  Potassium 3.5, magnesium 2.  Head CT -without acute abnormality but shows multiple brain metastasis-?  If these are contributing to her change in mental status.  UA shows rare bacteria.  Tachypneic to 30, on room air, without obvious dyspnea- chest X-ray- chronic consolidation and volume loss in left upper lobe at site of known non-small cell cancer.  Without acute abnormality.  Abdomen is benign. She is  not having significant diarrhea. - Held off on steroids as patient and spouse deny significant diarrhea - Give 1L bolus, cont N/s 100cc/hr x 1 day - BMP a.m -Hold home psychoactive medications at this time, Cymbalta, gabapentin and PRN Ativan.  Oral thrush-from immunocompromise status cancer and chemotherapy.  Denies dysphagia. -Nystatin swish and swallow  Lung cancer with brain metastasis-follows with Dr. Earlie Server. S/p palliative radiotherapy to upper lung mass, systemic chemotherapy 2018- 2019, currently on immunotherapy with nivolumab. -Held off on oncology consult at this time, as symptoms may just be secondary to dehydration.  Pending clinical course may benefit from consult.  COPD-stable. -PRN duo nebs  Hypertension-stable.  Not on antihypertensives.  DVT prophylaxis: Lovenox Code Status: Full Family Communication: None at bedside Disposition Plan: Per rounding team Consults called: None Admission status: Obs,  Med-surg.   Bethena Roys MD [facility]  02/10/2019, 9:09 PM

## 2019-02-10 NOTE — Progress Notes (Signed)
CHART NOTE I incidentally found the patient's name on my inpatient list.  I reviewed the history of current complain from the emergency department APP note. There is indication that the patient has been suffering of diarrhea the last 2 days and now presenting with mental status change and weakness. Her diarrhea could be secondary to the treatment with immunotherapy with nivolumab.  If her daily diarrhea is more than 4-6 times a day, this could be secondary to immunotherapy mediated colitis. I would recommend for the patient to start high-dose prednisone 1 mg/KG and to be tapered slowly over the next few weeks.  Imodium or Lomotil would not be effective in this condition. Thank you for taking good care of Jacqueline Kidd.  Please call the office if you have any other question.

## 2019-02-10 NOTE — ED Provider Notes (Signed)
[facility] Provider Note   CSN: [id number] Arrival date & time: 02/10/19  1424    History   Chief Complaint Chief Complaint  Patient presents with  . Weakness    HPI Jacqueline Kidd is a 78 y.o. female  Who  has a past medical history of Chronic low back pain, COPD (chronic obstructive pulmonary disease) ([place]), GERD (gastroesophageal reflux disease), Hyperlipidemia, Hypertension, Lesion of left lung, lung ca (dx'd 01/2017), and Osteoporosis. Patient is confused and unable to give history at this time.  There is a level 5 caveat due to altered mental status.  She does have a known history of lung metastasis to the brain.  According to nursing intake note the patient is complaining of diarrhea and weakness for the past 2 days. Patient denies any complaints at this time. I was able to gather history from the patient's daughter-in-law Henry Russel.  She states that over the past week the patient has had significant declining mental status.  She has become more and more confused and has had episodes of lethargy.  She states that prior to this week the patient was lucid.  She is on no new medications.  She states that she has had several episodes of stooling on herself and loose stools and diarrhea.  She is not taking any antibiotics.  Review of EMR shows that the patient's previous MRI brain showed increased swelling of the brain metastases thought to be secondary to post radiation treatment swelling.     HPI  Past Medical History:  Diagnosis Date  . Chronic low back pain   . COPD (chronic obstructive pulmonary disease) ([place])   . GERD (gastroesophageal reflux disease)   . Hyperlipidemia   . Hypertension   . Lesion of left lung   . lung ca dx'd 01/2017  . Osteoporosis     Patient Active Problem List   Diagnosis Date Noted  . Acute metabolic encephalopathy 38/04/1750  . Encounter for antineoplastic immunotherapy 03/25/2018  . Chemotherapy-induced neuropathy ([place])  02/18/2018  . Peripheral neuropathy 11/18/2017  . Need for influenza vaccination 04/15/2017  . Brain metastases ([place]) 03/30/2017  . Goals of care, counseling/discussion 03/23/2017  . Encounter for antineoplastic chemotherapy 03/23/2017  . Mass of parotid gland 23-Oct-202018  . Primary cancer of left upper lobe of lung ([place]) 02/19/2017  . Lung mass 02/16/2017  . BMI 33.0-33.9,adult 03/21/2016  . Peripheral edema 03/21/2016  . Osteoporosis 06/21/2014  . Hypertension 01/21/2013    Past Surgical History:  Procedure Laterality Date  . ABDOMINAL HYSTERECTOMY    . TOTAL HIP ARTHROPLASTY     right     OB History   No obstetric history on file.      Home Medications    Prior to Admission medications   Medication Sig Start Date End Date Taking? Authorizing Provider  acetaminophen (TYLENOL) 325 MG tablet Take 325 mg by mouth every 6 (six) hours as needed for mild pain or moderate pain.    Yes [provider]  Albuterol Sulfate 108 (90 Base) MCG/ACT AEPB Inhale 2 puffs into the lungs every 6 (six) hours as needed. 09/28/17  Yes [name], MD  cholecalciferol (VITAMIN D3) 25 MCG (1000 UT) tablet Take 1,000 Units by mouth daily.   Yes [provider]  DULoxetine (CYMBALTA) 60 MG capsule Take 1 capsule (60 mg total) by mouth daily. 06/04/18  Yes Vaslow, Acey Lav, MD  gabapentin (NEURONTIN) 400 MG capsule Take 2 capsules (800 mg total) by mouth  at bedtime. 07/27/18  Yes Vaslow, Acey Lav, MD  LORazepam (ATIVAN) 0.5 MG tablet Take 1 tablet (0.5 mg total) by mouth as needed for anxiety (take one tablet 30 minutes prior to MRI scans and radiation treatment). 10/13/17  Yes Bruning, Ashlyn, PA-C  NIVOLUMAB IV Inject 1 Dose into the vein every 28 (twenty-eight) days.   Yes [provider]  Omega-3 Fatty Acids (FISH OIL OMEGA-3 PO) Take 1 capsule by mouth daily.    Yes [provider]  prochlorperazine (COMPAZINE) 10 MG tablet Take 1 tablet (10 mg total) by mouth  every 6 (six) hours as needed for nausea or vomiting. 01/27/19  Yes Curt Bears, MD    Family History Family History  Problem Relation Age of Onset  . Cancer Mother        breast  . Cancer Father        esophageal  . Cancer Sister        breast    Social History Social History   Tobacco Use  . Smoking status: Former Smoker    Packs/day: 1.00    Years: 57.00    Pack years: 57.00    Quit date: 02/16/2017    Years since quitting: 1.9  . Smokeless tobacco: Never Used  Substance Use Topics  . Alcohol use: No  . Drug use: No     Allergies   Fosamax [alendronate], Hydrocodone-acetaminophen, and Lisinopril-hydrochlorothiazide   Review of Systems Review of Systems  Unable to perform ROS: Mental status change     Physical Exam Updated Vital Signs BP 115/66   Pulse 90   Resp (!) 31   Ht 5\' 1"  (1.549 m)   Wt 43.1 kg   SpO2 97%   BMI 17.95 kg/m   Physical Exam Vitals signs and nursing note reviewed.  Constitutional:      General: She is not in acute distress.    Appearance: She is well-developed. She is not diaphoretic.  HENT:     Head: Normocephalic and atraumatic.  Eyes:     General: No scleral icterus.    Conjunctiva/sclera: Conjunctivae normal.  Neck:     Musculoskeletal: Normal range of motion.  Cardiovascular:     Rate and Rhythm: Normal rate and regular rhythm.     Heart sounds: Normal heart sounds. No murmur. No friction rub. No gallop.   Pulmonary:     Effort: Pulmonary effort is normal. No respiratory distress.     Breath sounds: Normal breath sounds.  Abdominal:     General: Bowel sounds are normal. There is no distension.     Palpations: Abdomen is soft. There is no mass.     Tenderness: There is no abdominal tenderness. There is no guarding.  Skin:    General: Skin is warm and dry.  Neurological:     General: No focal deficit present.     Mental Status: She is alert. She is disoriented and confused.     GCS: GCS eye subscore is 4. GCS  verbal subscore is 4. GCS motor subscore is 6.     Cranial Nerves: Cranial nerves are intact.     Sensory: Sensation is intact.     Motor: Motor function is intact.     Coordination: Coordination is intact.  Psychiatric:        Behavior: Behavior normal.      ED Treatments / Results  Labs (all labs ordered are listed, but only abnormal results are displayed) Labs Reviewed  COMPREHENSIVE METABOLIC PANEL -  Abnormal; Notable for the following components:      Result Value   Sodium 134 (*)    Glucose, Bld 106 (*)    Calcium 8.4 (*)    Albumin 3.1 (*)    All other components within normal limits  CBC WITH DIFFERENTIAL/PLATELET - Abnormal; Notable for the following components:   RBC 3.42 (*)    Hemoglobin 11.2 (*)    HCT 35.1 (*)    MCV 102.6 (*)    Platelets 147 (*)    All other components within normal limits  AMMONIA - Abnormal; Notable for the following components:   Ammonia <9 (*)    All other components within normal limits  LACTIC ACID, PLASMA  ETHANOL  URINALYSIS, COMPLETE (UACMP) WITH MICROSCOPIC  RAPID URINE DRUG SCREEN, HOSP PERFORMED  CBG MONITORING, ED    EKG EKG Interpretation  Date/Time:  Thursday February 10 2019 15:26:52 EDT Ventricular Rate:  96 PR Interval:    QRS Duration: 98 QT Interval:  357 QTC Calculation: 452 R Axis:   62 Text Interpretation:  Sinus rhythm Anterior infarct, old Minimal ST elevation, inferior leads Confirmed by Milton Ferguson [phone]) on 02/10/2019 4:30:08 PM   Radiology Ct Head Wo Contrast  Result Date: 02/10/2019 CLINICAL DATA:  Altered level of consciousness. EXAM: CT HEAD WITHOUT CONTRAST TECHNIQUE: Contiguous axial images were obtained from the base of the skull through the vertex without intravenous contrast. COMPARISON:  Brain MRI January 28, 2019 FINDINGS: Brain: No evidence of acute infarction, or hemorrhage. Stable to mildly increased dilation of the ventricles, accounting for cross modality comparison. Multiple brain  metastatic lesions previously demonstrated by MRI are poorly seen. Moderate brain parenchymal volume loss and deep white matter areas of hypoattenuation, which in correlation to recent brain MRI were thought to represent combination of sequela of radiation therapy and microvascular ischemia. Stable sequela of prior lacunar left thalamic infarct. Vascular: Intracranial calcific atherosclerotic disease. Skull: Normal. Negative for fracture or focal lesion. Sinuses/Orbits: No acute finding. Other: None. IMPRESSION: 1. No acute intracranial abnormality. 2. Stable to mildly increased dilation of the ventricles. 3. Multiple brain metastatic lesions previously demonstrated by MRI are poorly seen. 4. Moderate brain parenchymal volume loss and deep white matter areas of hypoattenuation, which in correlation to recent brain MRI were thought to represent combination of sequela of radiation therapy and microvascular ischemia. Electronically Signed   By: Fidela Salisbury M.D.   On: 02/10/2019 16:30    Procedures Procedures (including critical care time)  Medications Ordered in ED Medications  prochlorperazine (COMPAZINE) injection 10 mg (has no administration in time range)     Initial Impression / Assessment and Plan / ED Course  I have reviewed the triage vital signs and the nursing notes.  Pertinent labs & imaging results that were available during my care of the patient were reviewed by me and considered in my medical decision making (see chart for details).  Clinical Course as of Feb 09 1829  Thu Feb 10, 2019  1628 Sodium(!): 134 [AH]  1628 Ammonia(!): <9 [AH]  1628 MCV(!): 102.6 [AH]  1713 I spoke with Dr. Worthy Keeler who recommends admission to [facility] [AH] Margarita Mail, PA-C       CC: Altered mental status VS:  Vitals:   02/10/19 1426 02/10/19 1430 02/10/19 1500  BP:  132/78 115/66  Pulse:  100 90  Resp:   (!) 31  SpO2:  97% 97%  Weight: 43.1  kg  Height: 5\' 1"  (1.549 m)     FI:EPPIRJJ is gathered by EMR and phone call with patient's daughter-in-law. DDX:The differential diagnosis for AMS is extensive and includes, but is not limited to: drug overdose - opioids, alcohol, sedatives, antipsychotics, drug withdrawal, others; Metabolic: hypoxia, hypoglycemia, hyperglycemia, hypercalcemia, hypernatremia, hyponatremia, uremia, hepatic encephalopathy, hypothyroidism, hyperthyroidism, vitamin B12 or thiamine deficiency, carbon monoxide poisoning, Wilson's disease, Lactic acidosis, DKA/HHOS; Infectious: meningitis, encephalitis, bacteremia/sepsis, urinary tract infection, pneumonia, neurosyphilis; Structural: Space-occupying lesion, (brain tumor, subdural hematoma, hydrocephalus,); Vascular: stroke, subarachnoid hemorrhage, coronary ischemia, hypertensive encephalopathy, CNS vasculitis, thrombotic thrombocytopenic purpura, disseminated intravascular coagulation, hyperviscosity; Psychiatric: Schizophrenia, depression; Other: Seizure, hypothermia, heat stroke, ICU psychosis, dementia -"sundowning."  Labs: I reviewed the labs which show mild hyponatremia, slightly elevated blood glucose.  Mild hypocalcemia and hypoalbuminemia.  Patient CBC shows mild macrocytic anemia with mild thrombocytopenia.  Patient has normal lactic acid levels, normal ammonia and ethanol levels. Imaging: I personally reviewed the images (CT head) which show(s)   No acute abnormalities, no bleeds, no mass-effect. EKG:  EKG Interpretation  Date/Time:  Thursday February 10 2019 15:26:52 EDT Ventricular Rate:  96 PR Interval:    QRS Duration: 98 QT Interval:  357 QTC Calculation: 452 R Axis:   62 Text Interpretation:  Sinus rhythm Anterior infarct, old Minimal ST elevation, inferior leads Confirmed by Milton Ferguson [phone]) on 02/10/2019 4:30:08 PM      MDM: 78 year old female with known metastatic non-small cell lung carcinoma, no episodes of diarrhea here however review of  EMR does show that Dr. Julien Nordmann put in a note dated May be secondary to her new immunotherapy medication.  He places recommendations.  Denied having obvious reason for her altered mentation.  Her urine is still currently pending.  CT is negative for any acute changes since her previous MRI 2 weeks ago although it does not assess the metastases as well as the MRI.  She does not appear to have any hypoxia.  She is afebrile.  The patient will be admitted by the hospitalist service and transferred to [facility] long for continuity of care with her oncology team.  I have informed the patient's daughter-in-law who wishes to receive updates. Patient disposition: Admit Patient condition: Stable. The patient appears reasonably stabilized for admission considering the current resources, flow, and capabilities available in the ED at this time, and I doubt any other [facility] requiring further screening and/or treatment in the ED prior to admission.   Final Clinical Impressions(s) / ED Diagnoses   Final diagnoses:  Altered mental status, unspecified altered mental status type    ED Discharge Orders    None       Margarita Mail, PA-C 02/10/19 1842    Milton Ferguson, MD 02/11/19 [phone]

## 2019-02-10 NOTE — ED Notes (Signed)
ED TO INPATIENT HANDOFF REPORT  ED Nurse Name and Phone #: CTurner, RN  S Name/Age/Gender Jacqueline Kidd 78 y.o. female Room/Bed: APA19/APA19  Code Status   Code Status: Not on file  Home/SNF/Other Home Patient oriented to: self and place Is this baseline? No   Triage Complete: Triage complete  Chief Complaint dehydration  Triage Note Pt from home with family. Pt has been having diarrhea and weakness x 2 days. Denies any pain at this time    Allergies Allergies  Allergen Reactions  . Fosamax [Alendronate]   . Hydrocodone-Acetaminophen Nausea And Vomiting    EXTREME VOMITING   . Lisinopril-Hydrochlorothiazide Other (See Comments)    Chest spasms    Level of Care/Admitting Diagnosis ED Disposition    ED Disposition Condition Comment   Admit  Hospital Area: [facility] [[id number]]  Level of Care: Med-Surg [16]  Covid Evaluation: Asymptomatic Screening Protocol (No Symptoms)  Diagnosis: Acute metabolic encephalopathy [[id number]]  Admitting Physician: Bethena Roys [phone]  Attending Physician: Bethena Roys Nessa.Cuff  PT Class (Do Not Modify): Observation [104]  PT Acc Code (Do Not Modify): Observation [10022]       B Medical/Surgery History Past Medical History:  Diagnosis Date  . Chronic low back pain   . COPD (chronic obstructive pulmonary disease) ([place])   . GERD (gastroesophageal reflux disease)   . Hyperlipidemia   . Hypertension   . Lesion of left lung   . lung ca dx'd 01/2017  . Osteoporosis    Past Surgical History:  Procedure Laterality Date  . ABDOMINAL HYSTERECTOMY    . TOTAL HIP ARTHROPLASTY     right     A IV Location/Drains/Wounds Patient Lines/Drains/Airways Status   Active Line/Drains/Airways    Name:   Placement date:   Placement time:   Site:   Days:   Peripheral IV 02/10/19 Right Antecubital   02/10/19    1834    Antecubital   less than 1          Intake/Output Last 24 hours  Intake/Output  Summary (Last 24 hours) at 02/10/2019 2337 Last data filed at 02/10/2019 2226 Gross per 24 hour  Intake 1000 ml  Output -  Net 1000 ml    Labs/Imaging Results for orders placed or performed during the hospital encounter of 02/10/19 (from the past 48 hour(s))  Urinalysis, Complete w Microscopic     Status: Abnormal   Collection Time: 02/10/19  3:19 PM  Result Value Ref Range   Color, Urine YELLOW YELLOW   APPearance CLOUDY (A) CLEAR   Specific Gravity, Urine 1.013 1.005 - 1.030   pH 7.0 5.0 - 8.0   Glucose, UA NEGATIVE NEGATIVE mg/dL   Hgb urine dipstick NEGATIVE NEGATIVE   Bilirubin Urine NEGATIVE NEGATIVE   Ketones, ur NEGATIVE NEGATIVE mg/dL   Protein, ur NEGATIVE NEGATIVE mg/dL   Nitrite NEGATIVE NEGATIVE   Leukocytes,Ua NEGATIVE NEGATIVE   RBC / HPF 0-5 0 - 5 RBC/hpf   WBC, UA 0-5 0 - 5 WBC/hpf   Bacteria, UA RARE (A) NONE SEEN   Mucus PRESENT    Amorphous Crystal PRESENT     Comment: Performed at [facility], [address], [place], [place] [address]  Urine rapid drug screen (hosp performed)     Status: None   Collection Time: 02/10/19  3:20 PM  Result Value Ref Range   Opiates NONE DETECTED NONE DETECTED   Cocaine NONE DETECTED NONE DETECTED   Benzodiazepines NONE DETECTED NONE DETECTED  Amphetamines NONE DETECTED NONE DETECTED   Tetrahydrocannabinol NONE DETECTED NONE DETECTED   Barbiturates NONE DETECTED NONE DETECTED    Comment: (NOTE) DRUG SCREEN FOR MEDICAL PURPOSES ONLY.  IF CONFIRMATION IS NEEDED FOR ANY PURPOSE, NOTIFY LAB WITHIN 5 DAYS. LOWEST DETECTABLE LIMITS FOR URINE DRUG SCREEN Drug Class                     Cutoff (ng/mL) Amphetamine and metabolites    1000 Barbiturate and metabolites    200 Benzodiazepine                 660 Tricyclics and metabolites     300 Opiates and metabolites        300 Cocaine and metabolites        300 THC                            50 Performed at [facility], [address] [place], [place] [address]    Comprehensive metabolic panel     Status: Abnormal   Collection Time: 02/10/19  3:37 PM  Result Value Ref Range   Sodium 134 (L) 135 - 145 mmol/L   Potassium 3.5 3.5 - 5.1 mmol/L   Chloride 101 98 - 111 mmol/L   CO2 27 22 - 32 mmol/L   Glucose, Bld 106 (H) 70 - 99 mg/dL   BUN 10 8 - 23 mg/dL   Creatinine, Ser 0.53 0.44 - 1.00 mg/dL   Calcium 8.4 (L) 8.9 - 10.3 mg/dL   Total Protein 7.0 6.5 - 8.1 g/dL   Albumin 3.1 (L) 3.5 - 5.0 g/dL   AST 18 15 - 41 U/L   ALT 13 0 - 44 U/L   Alkaline Phosphatase 76 38 - 126 U/L   Total Bilirubin 0.6 0.3 - 1.2 mg/dL   GFR calc non Af Amer >60 >60 mL/min   GFR calc Af Amer >60 >60 mL/min   Anion gap 6 5 - 15    Comment: Performed at [facility], [address] [place], [place] [address]  CBC WITH DIFFERENTIAL     Status: Abnormal   Collection Time: 02/10/19  3:37 PM  Result Value Ref Range   WBC 6.3 4.0 - 10.5 K/uL   RBC 3.42 (L) 3.87 - 5.11 MIL/uL   Hemoglobin 11.2 (L) 12.0 - 15.0 g/dL   HCT 35.1 (L) 36.0 - 46.0 %   MCV 102.6 (H) 80.0 - 100.0 fL   MCH 32.7 26.0 - 34.0 pg   MCHC 31.9 30.0 - 36.0 g/dL   RDW 13.5 11.5 - 15.5 %   Platelets 147 (L) 150 - 400 K/uL   nRBC 0.0 0.0 - 0.2 %   Neutrophils Relative % 74 %   Neutro Abs 4.7 1.7 - 7.7 K/uL   Lymphocytes Relative 14 %   Lymphs Abs 0.9 0.7 - 4.0 K/uL   Monocytes Relative 10 %   Monocytes Absolute 0.6 0.1 - 1.0 K/uL   Eosinophils Relative 1 %   Eosinophils Absolute 0.0 0.0 - 0.5 K/uL   Basophils Relative 0 %   Basophils Absolute 0.0 0.0 - 0.1 K/uL   Immature Granulocytes 1 %   Abs Immature Granulocytes 0.03 0.00 - 0.07 K/uL    Comment: Performed at [facility], [address], [place], [place] [address]  Ammonia     Status: Abnormal   Collection Time: 02/10/19  3:37 PM  Result Value Ref Range  Ammonia <9 (L) 9 - 35 umol/L    Comment: Performed at [facility], [address] [place], [place] [address]  Lactic acid, plasma     Status: None   Collection Time: 02/10/19  3:37 PM   Result Value Ref Range   Lactic Acid, Venous 1.1 0.5 - 1.9 mmol/L    Comment: Performed at [facility], [address], [place], [place] [address]  Ethanol     Status: None   Collection Time: 02/10/19  3:37 PM  Result Value Ref Range   Alcohol, Ethyl (B) <10 <10 mg/dL    Comment: (NOTE) Lowest detectable limit for serum alcohol is 10 mg/dL. For medical purposes only. Performed at [facility], [address] [place], [place] [address]   Magnesium     Status: None   Collection Time: 02/10/19  3:37 PM  Result Value Ref Range   Magnesium 2.0 1.7 - 2.4 mg/dL    Comment: Performed at [facility], [address] [place],  [address]  CBG monitoring, ED     Status: None   Collection Time: 02/10/19  5:30 PM  Result Value Ref Range   Glucose-Capillary 95 70 - 99 mg/dL  SARS Coronavirus 2 [facility] order, Performed in [facility] hospital lab) Nasopharyngeal Nasopharyngeal Swab     Status: None   Collection Time: 02/10/19  9:23 PM   Specimen: Nasopharyngeal Swab  Result Value Ref Range   SARS Coronavirus 2 NEGATIVE NEGATIVE    Comment: (NOTE) If result is NEGATIVE SARS-CoV-2 target nucleic acids are NOT DETECTED. The SARS-CoV-2 RNA is generally detectable in upper and lower  respiratory specimens during the acute phase of infection. The lowest  concentration of SARS-CoV-2 viral copies this assay can detect is 250  copies / mL. A negative result does not preclude SARS-CoV-2 infection  and should not be used as the sole basis for treatment or other  patient management decisions.  A negative result may occur with  improper specimen collection / handling, submission of specimen other  than nasopharyngeal swab, presence of viral mutation(s) within the  areas targeted by this assay, and inadequate number of viral copies  (<250 copies / mL). A negative result must be combined with clinical  observations, patient history, and epidemiological information. If result is  POSITIVE SARS-CoV-2 target nucleic acids are DETECTED. The SARS-CoV-2 RNA is generally detectable in upper and lower  respiratory specimens dur ing the acute phase of infection.  Positive  results are indicative of active infection with SARS-CoV-2.  Clinical  correlation with patient history and other diagnostic information is  necessary to determine patient infection status.  Positive results do  not rule out bacterial infection or co-infection with other viruses. If result is PRESUMPTIVE POSTIVE SARS-CoV-2 nucleic acids MAY BE PRESENT.   A presumptive positive result was obtained on the submitted specimen  and confirmed on repeat testing.  While 2019 novel coronavirus  (SARS-CoV-2) nucleic acids may be present in the submitted sample  additional confirmatory testing may be necessary for epidemiological  and / or clinical management purposes  to differentiate between  SARS-CoV-2 and other Sarbecovirus currently known to infect humans.  If clinically indicated additional testing with an alternate test  methodology [phone]) is advised. The SARS-CoV-2 RNA is generally  detectable in upper and lower respiratory sp ecimens during the acute  phase of infection. The expected result is Negative. Fact Sheet for Patients:  StrictlyIdeas.no Fact Sheet for Healthcare Providers: BankingDealers.co.za This test is not yet approved or  cleared by the [place] and has been authorized for detection and/or diagnosis of SARS-CoV-2 by FDA under an Emergency Use Authorization (EUA).  This EUA will remain in effect (meaning this test can be used) for the duration of the COVID-19 declaration under Section 564(b)(1) of the Act, 21 U.S.C. section 360bbb-3(b)(1), unless the authorization is terminated or revoked sooner. Performed at [facility], [address], [place], [place] [id number]    Ct Head Wo Contrast  Result Date: 02/10/2019 CLINICAL DATA:   Altered level of consciousness. EXAM: CT HEAD WITHOUT CONTRAST TECHNIQUE: Contiguous axial images were obtained from the base of the skull through the vertex without intravenous contrast. COMPARISON:  Brain MRI January 28, 2019 FINDINGS: Brain: No evidence of acute infarction, or hemorrhage. Stable to mildly increased dilation of the ventricles, accounting for cross modality comparison. Multiple brain metastatic lesions previously demonstrated by MRI are poorly seen. Moderate brain parenchymal volume loss and deep white matter areas of hypoattenuation, which in correlation to recent brain MRI were thought to represent combination of sequela of radiation therapy and microvascular ischemia. Stable sequela of prior lacunar left thalamic infarct. Vascular: Intracranial calcific atherosclerotic disease. Skull: Normal. Negative for fracture or focal lesion. Sinuses/Orbits: No acute finding. Other: None. IMPRESSION: 1. No acute intracranial abnormality. 2. Stable to mildly increased dilation of the ventricles. 3. Multiple brain metastatic lesions previously demonstrated by MRI are poorly seen. 4. Moderate brain parenchymal volume loss and deep white matter areas of hypoattenuation, which in correlation to recent brain MRI were thought to represent combination of sequela of radiation therapy and microvascular ischemia. Electronically Signed   By: Fidela Salisbury M.D.   On: 02/10/2019 16:30   Dg Chest Port 1 View  Result Date: 02/10/2019 CLINICAL DATA:  Acute metabolic encephalopathy. Confusion. EXAM: PORTABLE CHEST 1 VIEW COMPARISON:  CT scan of the chest dated 01/17/2019 FINDINGS: The heart size and pulmonary vascularity are normal. Aortic atherosclerosis. There is chronic consolidation and volume loss in the left upper lobe at the site of the known non-small cell carcinoma. There is also chronic elevation of the left hemidiaphragm. Right lung is clear. No acute bone abnormality. Old healed fracture of the proximal  right humerus. IMPRESSION: 1. Chronic consolidation and volume loss in the left upper lobe at the site of the known non-small cell carcinoma. Chronic elevation of the left hemidiaphragm. 2. No acute abnormalities. 3. Aortic atherosclerosis. Electronically Signed   By: Lorriane Shire M.D.   On: 02/10/2019 19:49    Pending Labs [employer] (From admission, onward)    Start     Ordered   Signed and Held  Basic metabolic panel  Tomorrow morning,   R     Signed and Held          Vitals/Pain Today's Vitals   02/10/19 2200 02/10/19 2300 02/10/19 2313 02/10/19 2336  BP: 130/71 (!) 100/57    Pulse: (!) 105 87    Resp: (!) 32 (!) 24    SpO2: 97% 97%    Weight:      Height:      PainSc:   0-No pain 0-No pain    Isolation Precautions No active isolations  Medications Medications  0.9 % NaCl with KCl 20 mEq/ L  infusion ( Intravenous Transfusing/Transfer 02/10/19 2314)  prochlorperazine (COMPAZINE) injection 10 mg (10 mg Intravenous Given 02/10/19 1834)  sodium chloride 0.9 % bolus 1,000 mL (0 mLs Intravenous Stopped 02/10/19 2226)    Mobility walks with device High fall risk  Focused Assessments    R Recommendations: See Admitting Provider Note  Report given to:   Additional Notes:

## 2019-02-10 NOTE — ED Triage Notes (Signed)
Pt from home with family. Pt has been having diarrhea and weakness x 2 days. Denies any pain at this time

## 2019-02-11 DIAGNOSIS — K219 Gastro-esophageal reflux disease without esophagitis: Secondary | ICD-10-CM | POA: Diagnosis present

## 2019-02-11 DIAGNOSIS — E860 Dehydration: Secondary | ICD-10-CM | POA: Diagnosis not present

## 2019-02-11 DIAGNOSIS — Z85118 Personal history of other malignant neoplasm of bronchus and lung: Secondary | ICD-10-CM | POA: Diagnosis not present

## 2019-02-11 DIAGNOSIS — Z888 Allergy status to other drugs, medicaments and biological substances status: Secondary | ICD-10-CM | POA: Diagnosis not present

## 2019-02-11 DIAGNOSIS — Z885 Allergy status to narcotic agent status: Secondary | ICD-10-CM | POA: Diagnosis not present

## 2019-02-11 DIAGNOSIS — C3490 Malignant neoplasm of unspecified part of unspecified bronchus or lung: Secondary | ICD-10-CM | POA: Diagnosis not present

## 2019-02-11 DIAGNOSIS — M810 Age-related osteoporosis without current pathological fracture: Secondary | ICD-10-CM | POA: Diagnosis present

## 2019-02-11 DIAGNOSIS — R197 Diarrhea, unspecified: Secondary | ICD-10-CM | POA: Diagnosis present

## 2019-02-11 DIAGNOSIS — Z96641 Presence of right artificial hip joint: Secondary | ICD-10-CM | POA: Diagnosis present

## 2019-02-11 DIAGNOSIS — T451X5A Adverse effect of antineoplastic and immunosuppressive drugs, initial encounter: Secondary | ICD-10-CM | POA: Diagnosis present

## 2019-02-11 DIAGNOSIS — C3412 Malignant neoplasm of upper lobe, left bronchus or lung: Secondary | ICD-10-CM | POA: Diagnosis present

## 2019-02-11 DIAGNOSIS — E871 Hypo-osmolality and hyponatremia: Secondary | ICD-10-CM | POA: Diagnosis not present

## 2019-02-11 DIAGNOSIS — Z79899 Other long term (current) drug therapy: Secondary | ICD-10-CM | POA: Diagnosis not present

## 2019-02-11 DIAGNOSIS — J449 Chronic obstructive pulmonary disease, unspecified: Secondary | ICD-10-CM | POA: Diagnosis present

## 2019-02-11 DIAGNOSIS — Z20828 Contact with and (suspected) exposure to other viral communicable diseases: Secondary | ICD-10-CM | POA: Diagnosis present

## 2019-02-11 DIAGNOSIS — G8929 Other chronic pain: Secondary | ICD-10-CM | POA: Diagnosis present

## 2019-02-11 DIAGNOSIS — G9341 Metabolic encephalopathy: Secondary | ICD-10-CM | POA: Diagnosis not present

## 2019-02-11 DIAGNOSIS — B370 Candidal stomatitis: Secondary | ICD-10-CM | POA: Diagnosis present

## 2019-02-11 DIAGNOSIS — I10 Essential (primary) hypertension: Secondary | ICD-10-CM | POA: Diagnosis present

## 2019-02-11 DIAGNOSIS — C7931 Secondary malignant neoplasm of brain: Secondary | ICD-10-CM | POA: Diagnosis not present

## 2019-02-11 DIAGNOSIS — Z923 Personal history of irradiation: Secondary | ICD-10-CM | POA: Diagnosis not present

## 2019-02-11 DIAGNOSIS — M545 Low back pain: Secondary | ICD-10-CM | POA: Diagnosis present

## 2019-02-11 DIAGNOSIS — R4182 Altered mental status, unspecified: Secondary | ICD-10-CM | POA: Diagnosis present

## 2019-02-11 DIAGNOSIS — E785 Hyperlipidemia, unspecified: Secondary | ICD-10-CM | POA: Diagnosis present

## 2019-02-11 DIAGNOSIS — Z87891 Personal history of nicotine dependence: Secondary | ICD-10-CM | POA: Diagnosis not present

## 2019-02-11 LAB — BASIC METABOLIC PANEL
Anion gap: 7 (ref 5–15)
BUN: 7 mg/dL — ABNORMAL LOW (ref 8–23)
CO2: 25 mmol/L (ref 22–32)
Calcium: 8.1 mg/dL — ABNORMAL LOW (ref 8.9–10.3)
Chloride: 103 mmol/L (ref 98–111)
Creatinine, Ser: 0.53 mg/dL (ref 0.44–1.00)
GFR calc Af Amer: >60 mL/min (ref >60)
GFR calc non Af Amer: >60 mL/min (ref >60)
Glucose, Bld: 92 mg/dL (ref 70–99)
Potassium: 3.5 mmol/L (ref 3.5–5.1)
Sodium: 135 mmol/L (ref 135–145)

## 2019-02-11 MED ORDER — DULOXETINE HCL 30 MG PO CPEP
30.0000 mg | ORAL_CAPSULE | Freq: Every day | ORAL | Status: DC
  Administered 2019-02-11: 30 mg via ORAL
  Filled 2019-02-11 (×2): qty 1

## 2019-02-11 MED ORDER — DEXAMETHASONE SODIUM PHOSPHATE 10 MG/ML IJ SOLN
8.0000 mg | Freq: Once | INTRAMUSCULAR | Status: AC
  Administered 2019-02-11: 19:00:00 8 mg via INTRAVENOUS
  Filled 2019-02-11: qty 0.8

## 2019-02-11 MED ORDER — PROCHLORPERAZINE MALEATE 10 MG PO TABS: 10.0000 mg | ORAL_TABLET | Freq: Four times a day (QID) | ORAL | Status: DC | PRN

## 2019-02-11 MED ORDER — NYSTATIN 100000 UNIT/ML MT SUSP
5.0000 mL | Freq: Four times a day (QID) | OROMUCOSAL | Status: DC
  Administered 2019-02-11 – 2019-02-12 (×5): 500000 [IU] via ORAL
  Filled 2019-02-11 (×5): qty 5

## 2019-02-11 MED ORDER — DEXAMETHASONE SODIUM PHOSPHATE 4 MG/ML IJ SOLN
4.0000 mg | INTRAMUSCULAR | Status: DC
  Filled 2019-02-11: qty 1

## 2019-02-11 MED ORDER — ENOXAPARIN SODIUM 40 MG/0.4ML ~~LOC~~ SOLN
40.0000 mg | Freq: Every day | SUBCUTANEOUS | Status: DC
  Administered 2019-02-11 – 2019-02-12 (×2): 40 mg via SUBCUTANEOUS
  Filled 2019-02-11 (×2): qty 0.4

## 2019-02-11 MED ORDER — POLYETHYLENE GLYCOL 3350 17 G PO PACK: 17.0000 g | PACK | Freq: Every day | ORAL | Status: DC | PRN

## 2019-02-11 MED ORDER — ACETAMINOPHEN 650 MG RE SUPP: 650.0000 mg | Freq: Four times a day (QID) | RECTAL | Status: DC | PRN

## 2019-02-11 MED ORDER — GABAPENTIN 300 MG PO CAPS
300.0000 mg | ORAL_CAPSULE | Freq: Every day | ORAL | Status: DC
  Administered 2019-02-11: 300 mg via ORAL
  Filled 2019-02-11: qty 1

## 2019-02-11 MED ORDER — IPRATROPIUM-ALBUTEROL 0.5-2.5 (3) MG/3ML IN SOLN: 3.0000 mL | RESPIRATORY_TRACT | Status: DC | PRN

## 2019-02-11 MED ORDER — ENSURE ENLIVE PO LIQD: 237.0000 mL | Freq: Two times a day (BID) | ORAL | Status: DC

## 2019-02-11 MED ORDER — ACETAMINOPHEN 325 MG PO TABS: 650.0000 mg | ORAL_TABLET | Freq: Four times a day (QID) | ORAL | Status: DC | PRN

## 2019-02-11 NOTE — Evaluation (Signed)
Occupational Therapy Evaluation Patient Details Name: Jacqueline Kidd MRN: [id number] DOB: 02/10/1941 Today's Date: 02/11/2019    History of Present Illness This 78 y.o. female with h/o lung CA with brain mets, admitted with diarrhea, weakness, confusion, and lethargy. MRI of brain showed 1. No acute intracranial abnormality; 2. Stable to mildly increased dilation of the ventricles; 3. Multiple brain metastatic lesions previously demonstrated by MRI are poorly seen; 4. Moderate brain parenchymal volume loss and deep white matter areas of hypoattenuation, which in correlation to recent brain MRI were thought to represent combination of sequela of radiation therapy and microvascular ischemia. Dx: acute metabolic encephalopathy.  PMH includes; COPD, Chronic LBP    Clinical Impression   Pt admitted with above. She demonstrates the below listed deficits and will benefit from continued OT to maximize safety and independence with BADLs.  Pt presents to OT with generalized weakness, impaired balance, decreased activity tolerance, as well as significant cognitive deficits that include deficits with orientation, problem solving, attention, initiation, sequencing.  She currently requires min - mod A for ADLs and functional mobility.  Per pt and chart review, she lived with spouse at home, and required assist, but she was unable to provide details re: amount of assist.  She will require 24 hour assist at discharge due to severity of cognitive deficits.  If family unable to provide this level of care, she will require SNF.       Follow Up Recommendations  Home health OT;Supervision/Assistance - 24 hour (SNF if family unable to provide 24 hour assist )   Equipment Recommendations  Wheelchair (measurements OT)    Recommendations for Other Services       Precautions / Restrictions Precautions Precautions: Fall Precaution Comments: impaired balance and significant cognitive deficits       Mobility Bed  Mobility Overal bed mobility: Needs Assistance Bed Mobility: Supine to Sit;Sit to Supine     Supine to sit: Min assist Sit to supine: Mod assist   General bed mobility comments: step by step cues to initiate and sequence, as well as assist to initiate movement.  She had difficulty problem solving how to get back into bed as she fatigued   Transfers Overall transfer level: Needs assistance Equipment used: 1 person hand held assist Transfers: Stand Pivot Transfers;Sit to/from Stand Sit to Stand: Min assist;Mod assist Stand pivot transfers: Mod assist;Min assist       General transfer comment: Pt required min A to move sit to stand from EOB, and mod A from the toilet.  She was able to transfer with min A until distracted, then required mod A to prevent fall     Balance Overall balance assessment: Needs assistance Sitting-balance support: Feet supported Sitting balance-Leahy Scale: Fair Sitting balance - Comments: requires min guard assist    Standing balance support: Single extremity supported Standing balance-Leahy Scale: Poor Standing balance comment: requires UE support and up to min A for static standing                            ADL either performed or assessed with clinical judgement   ADL Overall ADL's : Needs assistance/impaired Eating/Feeding: Set up;Sitting   Grooming: Wash/dry hands;Oral care;Minimal assistance;Standing Grooming Details (indicate cue type and reason): assist for standing balance  Upper Body Bathing: Moderate assistance;Sitting   Lower Body Bathing: Moderate assistance;Sit to/from stand Lower Body Bathing Details (indicate cue type and reason): cues for thoroughness and sequencing  Upper Body  Dressing : Moderate assistance;Sitting   Lower Body Dressing: Moderate assistance;Sit to/from stand   Toilet Transfer: Moderate assistance;Ambulation;Comfort height toilet Toilet Transfer Details (indicate cue type and reason): pt requires  assist for balance, sit to stand from commode.  She had incontinence while ambulating, but was unable to explain this to therapist, and instead became anxious/restless, and lost her balance  Toileting- Clothing Manipulation and Hygiene: Moderate assistance;Sit to/from stand       Functional mobility during ADLs: Moderate assistance       Vision         Perception Perception Comments: Did not formally assess due to time constraints and cognition    Praxis Praxis Praxis tested?: Deficits Deficits: Initiation    Pertinent Vitals/Pain Pain Assessment: No/denies pain     Hand Dominance Right   Extremity/Trunk Assessment Upper Extremity Assessment Upper Extremity Assessment: Generalized weakness   Lower Extremity Assessment Lower Extremity Assessment: Generalized weakness       Communication Communication Communication: Expressive difficulties   Cognition Arousal/Alertness: Awake/alert Behavior During Therapy: Impulsive Overall Cognitive Status: Impaired/Different from baseline Area of Impairment: Orientation;Attention;Memory;Following commands;Safety/judgement;Awareness;Problem solving                 Orientation Level: Disoriented to;Place;Time;Situation Current Attention Level: Sustained   Following Commands: Follows one step commands inconsistently;Follows one step commands with increased time Safety/Judgement: Decreased awareness of safety;Decreased awareness of deficits   Problem Solving: Slow processing;Decreased initiation;Difficulty sequencing;Requires verbal cues;Requires tactile cues General Comments: Pt demonstrates sustained attention, but is internally distracted.  She is very slow to process info and to respond.  She requires mod cues for problem solving.     General Comments  DOE 3/4 - 4/4 with activity     Exercises     Shoulder Instructions      Home Living Family/patient expects to be discharged to:: Private residence Living  Arrangements: Spouse/significant other Available Help at Discharge: Family;Available 24 hours/day                         Home Equipment: Shower seat   Additional Comments: Pt unable to provide details.  She does indicate she has a shower seat       Prior Functioning/Environment Level of Independence: Needs assistance        Comments: Per RN, family has been providing assist, but pt unable to provide details         OT Problem List: Decreased strength;Decreased activity tolerance;Impaired balance (sitting and/or standing);Decreased cognition;Decreased safety awareness;Decreased knowledge of use of DME or AE      OT Treatment/Interventions: Self-care/ADL training;Energy conservation;DME and/or AE instruction;Therapeutic activities;Cognitive remediation/compensation;Patient/family education;Balance training    OT Goals(Current goals can be found in the care plan section) Acute Rehab OT Goals OT Goal Formulation: Patient unable to participate in goal setting Time For Goal Achievement: 02/25/19 Potential to Achieve Goals: Good ADL Goals Pt Will Perform Grooming: with min guard assist;standing Pt Will Perform Upper Body Bathing: with supervision;sitting Pt Will Perform Lower Body Bathing: with min assist;sit to/from stand Pt Will Perform Upper Body Dressing: with min assist;sitting Pt Will Perform Lower Body Dressing: with min assist;sit to/from stand Pt Will Transfer to Toilet: with min assist;ambulating;regular height toilet;grab bars;bedside commode Pt Will Perform Toileting - Clothing Manipulation and hygiene: with min assist;sit to/from stand  OT Frequency: Min 2X/week   Barriers to D/C:    family not present to confirm 24 hour care        Co-evaluation  AM-PAC OT "6 Clicks" Daily Activity     Outcome Measure Help from another person eating meals?: A Little Help from another person taking care of personal grooming?: A Little Help from  another person toileting, which includes using toliet, bedpan, or urinal?: A Lot Help from another person bathing (including washing, rinsing, drying)?: A Lot Help from another person to put on and taking off regular upper body clothing?: A Lot Help from another person to put on and taking off regular lower body clothing?: A Lot 6 Click Score: 14   End of Session Nurse Communication: Mobility status  Activity Tolerance: Patient limited by fatigue Patient left: in bed;with call bell/phone within reach;with bed alarm set  OT Visit Diagnosis: Unsteadiness on feet (R26.81);Cognitive communication deficit (R41.841)                Time: 1345-1411 OT Time Calculation (min): 26 min Charges:  OT General Charges $OT Visit: 1 Visit OT Evaluation $OT Eval Moderate Complexity: 1 Mod OT Treatments $Self Care/Home Management : 8-22 mins  Lucille Passy, OTR/L Acute Rehabilitation Services Pager [phone] Office [place], [place] 02/11/2019, 2:43 PM

## 2019-02-11 NOTE — Progress Notes (Signed)
Initial Nutrition Assessment  INTERVENTION:   Ensure Enlive po BID, each supplement provides 350 kcal and 20 grams of protein  NUTRITION DIAGNOSIS:   Increased nutrient needs related to cancer and cancer related treatments as evidenced by estimated needs.  GOAL:   Patient will meet greater than or equal to 90% of their needs  MONITOR:   PO intake, Supplement acceptance, Labs, Weight trends, I & O's  REASON FOR ASSESSMENT:   Consult Assessment of nutrition requirement/status  ASSESSMENT:   78 year old female with history of HTN, lung cancer with brain mets, COPD not on oxygen and osteoporosis presented to [facility] with "diarrhea" and weakness for 2 days and confusion and lethargy for 1 week.  **RD working remotely**  Patient currently not eating well, "bites" per documentation. Pt unable to provide history given confusion. Per chart review, confusion/lethargy has lasted for a week now and has impacted her intakes. Patient would benefit from nutritional supplements given poor PO.  Per weight records, pt has lost 21 lbs since 1/21 (17% wt loss x 6.5 months, significant for time frame).    Labs reviewed. Medications reviewed.  NUTRITION - FOCUSED PHYSICAL EXAM:  Unable to perform -working remotely.  Diet Order:   Diet Order            Diet regular Room service appropriate? Yes; Fluid consistency: Thin  Diet effective now              EDUCATION NEEDS:   No education needs have been identified at this time  Skin:  Skin Assessment: Reviewed RN Assessment  Last BM:  8/6  Height:   Ht Readings from Last 1 Encounters:  02/11/19 5\' 1"  (1.549 m)    Weight:   Wt Readings from Last 1 Encounters:  02/11/19 46 kg    Ideal Body Weight:  47.7 kg  BMI:  Body mass index is 19.16 kg/m.  Estimated Nutritional Needs:   Kcal:  1400-1600  Protein:  65-75g  Fluid:  1.6L/day  Clayton Bibles, MS, RD, LDN [place] Dietitian Pager:  [phone] After Hours Pager: [phone]

## 2019-02-11 NOTE — Progress Notes (Signed)
PROGRESS NOTE  Jacqueline Kidd MGQ:[id number] DOB: Dec 13, 1940   PCP: Curt Bears, MD  Patient is from: Home  DOA: 02/10/2019 LOS: 0  Brief Narrative / Interim history: 78 year old female with history of HTN, lung cancer with brain mets, COPD not on oxygen and osteoporosis presented to [facility] with "diarrhea" and weakness for 2 days and confusion and lethargy for 1 week.  Admitted here per Dr. Tomie China recommendation to be followed by oncologist, Dr. Earlie Server here.  Initially, patient's symptoms/diarrhea were thought to be due to immunotherapy, nivolumab, and Dr. Julien Nordmann recommended high-dose prednisone 1 mg/kg/day.  However, patient has not had further diarrhea since arrival and steroid has not started.  Subjective: No major events overnight of this morning.  Has no complaints this morning.  She denies headache, vision change, chest pain, dyspnea, nausea, vomiting, abdominal pain, diarrhea, urinary symptoms or focal neuro symptoms.  She is oriented to self.  She knew she is in the hospital but does not know the name.  She thinks she is in [place].  Oriented to family name but not to time.   Objective: Vitals:   02/11/19 0100 02/11/19 0135 02/11/19 0146 02/11/19 0534  BP:   134/70 (!) 113/59  Pulse:   96 90  Resp:   18 16  Temp:   97.7 F (36.5 C) 98.8 F (37.1 C)  TempSrc:   Oral Oral  SpO2:   100% 97%  Weight: 46 kg 46 kg 46 kg   Height: 5\' 1"  (1.549 m) 5\' 1"  (1.549 m) 5\' 1"  (1.549 m)     Intake/Output Summary (Last 24 hours) at 02/11/2019 1312 Last data filed at 02/11/2019 0900 Gross per 24 hour  Intake 2083.64 ml  Output 200 ml  Net 1883.64 ml   Filed Weights   02/11/19 0100 02/11/19 0135 02/11/19 0146  Weight: 46 kg 46 kg 46 kg    Examination:  GENERAL: No acute distress.  Appears well.  HEENT: MMM.  Vision and hearing grossly intact.  NECK: Supple.  No apparent JVD.  RESP:  No IWOB. Good air movement bilaterally. CVS:  RRR. Heart sounds normal.   ABD/GI/GU: Bowel sounds present. Soft. Non tender.  MSK/EXT:  Moves extremities. No apparent deformity or edema.  SKIN: no apparent skin lesion or wound NEURO: Awake, alert and oriented to self.  She understands she is in the hospital but does not know the name.  Oriented to family member's name.  No focal neuro deficit. PSYCH: Calm. Normal affect.    I have personally reviewed the following labs and images:  Radiology Studies: Ct Head Wo Contrast  Result Date: 02/10/2019 CLINICAL DATA:  Altered level of consciousness. EXAM: CT HEAD WITHOUT CONTRAST TECHNIQUE: Contiguous axial images were obtained from the base of the skull through the vertex without intravenous contrast. COMPARISON:  Brain MRI January 28, 2019 FINDINGS: Brain: No evidence of acute infarction, or hemorrhage. Stable to mildly increased dilation of the ventricles, accounting for cross modality comparison. Multiple brain metastatic lesions previously demonstrated by MRI are poorly seen. Moderate brain parenchymal volume loss and deep white matter areas of hypoattenuation, which in correlation to recent brain MRI were thought to represent combination of sequela of radiation therapy and microvascular ischemia. Stable sequela of prior lacunar left thalamic infarct. Vascular: Intracranial calcific atherosclerotic disease. Skull: Normal. Negative for fracture or focal lesion. Sinuses/Orbits: No acute finding. Other: None. IMPRESSION: 1. No acute intracranial abnormality. 2. Stable to mildly increased dilation of the ventricles. 3. Multiple brain metastatic lesions  previously demonstrated by MRI are poorly seen. 4. Moderate brain parenchymal volume loss and deep white matter areas of hypoattenuation, which in correlation to recent brain MRI were thought to represent combination of sequela of radiation therapy and microvascular ischemia. Electronically Signed   By: Fidela Salisbury M.D.   On: 02/10/2019 16:30   Dg Chest Port 1 View  Result  Date: 02/10/2019 CLINICAL DATA:  Acute metabolic encephalopathy. Confusion. EXAM: PORTABLE CHEST 1 VIEW COMPARISON:  CT scan of the chest dated 01/17/2019 FINDINGS: The heart size and pulmonary vascularity are normal. Aortic atherosclerosis. There is chronic consolidation and volume loss in the left upper lobe at the site of the known non-small cell carcinoma. There is also chronic elevation of the left hemidiaphragm. Right lung is clear. No acute bone abnormality. Old healed fracture of the proximal right humerus. IMPRESSION: 1. Chronic consolidation and volume loss in the left upper lobe at the site of the known non-small cell carcinoma. Chronic elevation of the left hemidiaphragm. 2. No acute abnormalities. 3. Aortic atherosclerosis. Electronically Signed   By: Lorriane Shire M.D.   On: 02/10/2019 19:49    Microbiology: Recent Results (from the past 240 hour(s))  SARS Coronavirus 2 [facility] order, Performed in [facility] hospital lab) Nasopharyngeal Nasopharyngeal Swab     Status: None   Collection Time: 02/10/19  9:23 PM   Specimen: Nasopharyngeal Swab  Result Value Ref Range Status   SARS Coronavirus 2 NEGATIVE NEGATIVE Final    Comment: (NOTE) If result is NEGATIVE SARS-CoV-2 target nucleic acids are NOT DETECTED. The SARS-CoV-2 RNA is generally detectable in upper and lower  respiratory specimens during the acute phase of infection. The lowest  concentration of SARS-CoV-2 viral copies this assay can detect is 250  copies / mL. A negative result does not preclude SARS-CoV-2 infection  and should not be used as the sole basis for treatment or other  patient management decisions.  A negative result may occur with  improper specimen collection / handling, submission of specimen other  than nasopharyngeal swab, presence of viral mutation(s) within the  areas targeted by this assay, and inadequate number of viral copies  (<250 copies / mL). A negative result must be combined with clinical   observations, patient history, and epidemiological information. If result is POSITIVE SARS-CoV-2 target nucleic acids are DETECTED. The SARS-CoV-2 RNA is generally detectable in upper and lower  respiratory specimens dur ing the acute phase of infection.  Positive  results are indicative of active infection with SARS-CoV-2.  Clinical  correlation with patient history and other diagnostic information is  necessary to determine patient infection status.  Positive results do  not rule out bacterial infection or co-infection with other viruses. If result is PRESUMPTIVE POSTIVE SARS-CoV-2 nucleic acids MAY BE PRESENT.   A presumptive positive result was obtained on the submitted specimen  and confirmed on repeat testing.  While 2019 novel coronavirus  (SARS-CoV-2) nucleic acids may be present in the submitted sample  additional confirmatory testing may be necessary for epidemiological  and / or clinical management purposes  to differentiate between  SARS-CoV-2 and other Sarbecovirus currently known to infect humans.  If clinically indicated additional testing with an alternate test  methodology [phone]) is advised. The SARS-CoV-2 RNA is generally  detectable in upper and lower respiratory sp ecimens during the acute  phase of infection. The expected result is Negative. Fact Sheet for Patients:  StrictlyIdeas.no Fact Sheet for Healthcare Providers: BankingDealers.co.za This test is not yet approved or  cleared by the [place] and has been authorized for detection and/or diagnosis of SARS-CoV-2 by FDA under an Emergency Use Authorization (EUA).  This EUA will remain in effect (meaning this test can be used) for the duration of the COVID-19 declaration under Section 564(b)(1) of the Act, 21 U.S.C. section 360bbb-3(b)(1), unless the authorization is terminated or revoked sooner. Performed at [facility], [address],  [place], [place] [address]     Sepsis Labs: Invalid input(s): PROCALCITONIN, LACTICIDVEN  Urine analysis:    Component Value Date/Time   COLORURINE YELLOW 02/10/2019 1519   APPEARANCEUR CLOUDY (A) 02/10/2019 1519   APPEARANCEUR Clear 07/04/2016 1620   LABSPEC 1.013 02/10/2019 1519   PHURINE 7.0 02/10/2019 1519   GLUCOSEU NEGATIVE 02/10/2019 1519   HGBUR NEGATIVE 02/10/2019 1519   BILIRUBINUR NEGATIVE 02/10/2019 1519   BILIRUBINUR Negative 07/04/2016 1620   KETONESUR NEGATIVE 02/10/2019 1519   PROTEINUR NEGATIVE 02/10/2019 1519   NITRITE NEGATIVE 02/10/2019 1519   LEUKOCYTESUR NEGATIVE 02/10/2019 1519    Anemia Panel: No results for input(s): VITAMINB12, FOLATE, FERRITIN, TIBC, IRON, RETICCTPCT in the last 72 hours.  Thyroid Function Tests: No results for input(s): TSH, T4TOTAL, FREET4, T3FREE, THYROIDAB in the last 72 hours.  Lipid Profile: No results for input(s): CHOL, HDL, LDLCALC, TRIG, CHOLHDL, LDLDIRECT in the last 72 hours.  CBG: Recent Labs  Lab 02/10/19 1730  GLUCAP 95    HbA1C: No results for input(s): HGBA1C in the last 72 hours.  BNP (last 3 results): No results for input(s): PROBNP in the last 8760 hours.  Cardiac Enzymes: No results for input(s): CKTOTAL, CKMB, CKMBINDEX, TROPONINI in the last 168 hours.  Coagulation Profile: No results for input(s): INR, PROTIME in the last 168 hours.  Liver Function Tests: Recent Labs  Lab 02/10/19 1537  AST 18  ALT 13  ALKPHOS 76  BILITOT 0.6  PROT 7.0  ALBUMIN 3.1*   No results for input(s): LIPASE, AMYLASE in the last 168 hours. Recent Labs  Lab 02/10/19 1537  AMMONIA <9*    Basic Metabolic Panel: Recent Labs  Lab 02/10/19 1537 02/11/19 0444  NA 134* 135  K 3.5 3.5  CL 101 103  CO2 27 25  GLUCOSE 106* 92  BUN 10 7*  CREATININE 0.53 0.53  CALCIUM 8.4* 8.1*  MG 2.0  --    GFR: Estimated Creatinine Clearance: 41.4 mL/min (by C-G formula based on SCr of 0.53 mg/dL).  CBC: Recent Labs   Lab 02/10/19 1537  WBC 6.3  NEUTROABS 4.7  HGB 11.2*  HCT 35.1*  MCV 102.6*  PLT 147*    Procedures:  None  Microbiology summarized: SARS-CoV-2 screen negative.  Assessment & Plan: Acute metabolic encephalopathy: Not quite at baseline per family/husband although she has not been able to keep up with dates at baseline.  Hemodynamically stable.  CBC and CMP not impressive.  UA reveals rare bacteria but no UTI symptoms.  CXR with chronic consolidation and volume loss in LUL outside of known cancer.  Head CT without acute finding but multiple brain mets as noted on recent MRI.  She is on gabapentin, Compazine, Ativan and high-dose Cymbalta which could contribute. -Reduce Cymbalta to 30 mg daily -Reduce gabapentin to 300 mg nightly -Low-dose Ativan as needed -Hold Compazine -Frequent reorientation and delirium precautions -Will curbside oncology for further input. -PT/OT eval  "Diarrhea"/dehydration: Reportedly had about 2 BMs every 1 to 2 days. Concern this could be related to her immunotherapy but no further diarrhea since here.  No leukocytosis or abdominal tenderness to suggest infectious process. -We will hold off steroid at this time -We will use Imodium as needed -Continue IV fluids  History of lung cancer with brain mets: Status post palliative radiotherapy to upper lung mass, systemic chemo 2018-2019.  Currently on immunotherapy with nivolumab -Per oncology.   Chronic COPD: Stable.  Not on oxygen. -PRN duo nebs  Hypertension: Normotensive.  Not on meds. -PRN meds  DVT prophylaxis: Subcu Lovenox Code Status: Full code Family Communication: Discussed with patient's husband over the phone. Disposition Plan: Remains inpatient.  Still with significant encephalopathy and not quite back to baseline per family. Consultants: Oncology   Antimicrobials: Anti-infectives (From admission, onward)   None      Sch Meds:  Scheduled Meds: . DULoxetine  30 mg Oral Daily  .  enoxaparin (LOVENOX) injection  40 mg Subcutaneous Daily  . nystatin  5 mL Oral QID   Continuous Infusions: . 0.9 % NaCl with KCl 20 mEq / L 100 mL/hr at 02/11/19 0601   PRN Meds:.acetaminophen **OR** acetaminophen, ipratropium-albuterol, polyethylene glycol, prochlorperazine   Mayara Paulson T. [place]  If 7PM-7AM, please contact night-coverage [email or web address] Password [email or web address] 02/11/2019, 1:12 PM

## 2019-02-12 DIAGNOSIS — C3490 Malignant neoplasm of unspecified part of unspecified bronchus or lung: Secondary | ICD-10-CM

## 2019-02-12 DIAGNOSIS — E860 Dehydration: Secondary | ICD-10-CM

## 2019-02-12 DIAGNOSIS — R4182 Altered mental status, unspecified: Secondary | ICD-10-CM

## 2019-02-12 DIAGNOSIS — E871 Hypo-osmolality and hyponatremia: Secondary | ICD-10-CM

## 2019-02-12 LAB — CBC
HCT: 36.4 % (ref 36.0–46.0)
Hemoglobin: 12 g/dL (ref 12.0–15.0)
MCH: 33.7 pg (ref 26.0–34.0)
MCHC: 33 g/dL (ref 30.0–36.0)
MCV: 102.2 fL — ABNORMAL HIGH (ref 80.0–100.0)
Platelets: 141 10*3/uL — ABNORMAL LOW (ref 150–400)
RBC: 3.56 MIL/uL — ABNORMAL LOW (ref 3.87–5.11)
RDW: 13 % (ref 11.5–15.5)
WBC: 3.9 10*3/uL — ABNORMAL LOW (ref 4.0–10.5)
nRBC: 0 % (ref 0.0–0.2)

## 2019-02-12 LAB — COMPREHENSIVE METABOLIC PANEL
ALT: 13 U/L (ref 0–44)
AST: 19 U/L (ref 15–41)
Albumin: 3 g/dL — ABNORMAL LOW (ref 3.5–5.0)
Alkaline Phosphatase: 79 U/L (ref 38–126)
Anion gap: 8 (ref 5–15)
BUN: 7 mg/dL — ABNORMAL LOW (ref 8–23)
CO2: 22 mmol/L (ref 22–32)
Calcium: 8.4 mg/dL — ABNORMAL LOW (ref 8.9–10.3)
Chloride: 99 mmol/L (ref 98–111)
Creatinine, Ser: 0.37 mg/dL — ABNORMAL LOW (ref 0.44–1.00)
GFR calc Af Amer: >60 mL/min (ref >60)
GFR calc non Af Amer: >60 mL/min (ref >60)
Glucose, Bld: 137 mg/dL — ABNORMAL HIGH (ref 70–99)
Potassium: 4.1 mmol/L (ref 3.5–5.1)
Sodium: 129 mmol/L — ABNORMAL LOW (ref 135–145)
Total Bilirubin: 1.3 mg/dL — ABNORMAL HIGH (ref 0.3–1.2)
Total Protein: 7.1 g/dL (ref 6.5–8.1)

## 2019-02-12 LAB — SODIUM: Sodium: 130 mmol/L — ABNORMAL LOW (ref 135–145)

## 2019-02-12 LAB — OSMOLALITY, URINE: Osmolality, Ur: 449 mOsm/kg (ref 300–900)

## 2019-02-12 LAB — TSH: TSH: 0.521 u[IU]/mL (ref 0.350–4.500)

## 2019-02-12 LAB — VITAMIN B12: Vitamin B-12: 570 pg/mL (ref 180–914)

## 2019-02-12 LAB — MAGNESIUM: Magnesium: 1.9 mg/dL (ref 1.7–2.4)

## 2019-02-12 LAB — CREATININE, URINE, RANDOM: Creatinine, Urine: 43.09 mg/dL

## 2019-02-12 LAB — SODIUM, URINE, RANDOM: Sodium, Ur: 104 mmol/L

## 2019-02-12 MED ORDER — ENSURE ENLIVE PO LIQD: 237.0000 mL | Freq: Two times a day (BID) | ORAL | 0 refills | Status: AC

## 2019-02-12 MED ORDER — GABAPENTIN 300 MG PO CAPS: 300.0000 mg | ORAL_CAPSULE | Freq: Every day | ORAL | 0 refills | Status: AC

## 2019-02-12 MED ORDER — DEXAMETHASONE 4 MG PO TABS: 4.0000 mg | ORAL_TABLET | Freq: Every day | ORAL | 0 refills | Status: DC

## 2019-02-12 MED ORDER — DULOXETINE HCL 30 MG PO CPEP: 30.0000 mg | ORAL_CAPSULE | Freq: Every day | ORAL | 0 refills | Status: AC

## 2019-02-12 NOTE — Progress Notes (Signed)
Jacqueline Kidd seems to be doing a little bit better from what I can tell.  She really has no problems talking coherently.  She and I had excellent fellowship today.  She has a very strong faith..  She does have some difficulties with time.  She is not sure how long she has been in the hospital.  She knows that she has lung cancer and that it went to her brain.  She knows that she has had radiation to the brain.  More importantly that she knows that Dr. Julien Nordmann is her oncologist.  It looks like she probably does have more brain metastasis.  I probably would get radiation therapy to see her for the possibility of stereotactic radiosurgery.  She has lost quite a bit of weight.  She says she is lost 50 pounds since she was diagnosed.  She is having no problems with cough.  There is no shortness of breath.  There is no diarrhea.  She has a catheter in for her urine.  Her labs look okay.  Her white cell count is 3.9.  Hemoglobin 12 platelet count 141,000.  Her BUN is 7 creatinine 0.37.  Potassium 4.1.  Sodium is 129.  Her calcium is 8.4 with an albumin of 3.  On her physical exam, her temperature is 98.1.  Pulse 68.  Blood pressure 125/74.  Head neck exam shows no ocular or oral lesions.  There are no palpable cervical or supra clavicular lymph nodes.  She has no thrush.  Lungs sound pretty clear bilaterally.  There may be some slight wheezing.  Cardiac exam regular rate and rhythm.  Abdomen is soft.  Extremities show some muscle atrophy in upper and lower extremities.  Neurological exam shows some slight disorientation but really only to time.  Jacqueline Kidd has metastatic non-small cell lung cancer.  It looks like she may have progressive brain metastasis.  I would think that she might be a candidate for additional radiosurgery.  However, I am sure Dr. Julien Nordmann is well aware of all this.  He is always on top of his patients and knows exactly what to do for them.  I am just glad that we were able to have some  fellowship.  She does have a strong faith and is happy that she could pray with somebody.  Jacqueline Haw, MD  Exodus 14:14

## 2019-02-12 NOTE — Discharge Summary (Signed)
Physician Discharge Summary  Jacqueline Kidd TML:[id number] DOB: 13-Aug-1940 DOA: 02/10/2019  PCP: Curt Bears, MD  Admit date: 02/10/2019 Discharge date: 02/12/2019  Admitted From: Home Disposition: Home  Recommendations for Outpatient Follow-up:  1. Follow up with PCP in 1-2 weeks 2. Patient to follow-up with oncology as scheduled. 3. Please obtain CBC/BMP/Mag at follow up 4. Please follow up on the following pending results: Urine sodium, creatinine and osmolality  Home Health: PT/OT Equipment/Devices: Light wheelchair  Discharge Condition: Stable CODE STATUS: Full code  Hospital Course: 78 year old female with history of HTN, lung cancer with brain mets, COPD not on oxygen and osteoporosis presented to [facility] with "diarrhea" and weakness for 2 days and confusion and lethargy for 1 week.  Admitted here per Dr. Tomie China recommendation to be followed by oncologist, Dr. Earlie Server here.  Initially, patient's symptoms/diarrhea were thought to be due to immunotherapy, nivolumab, and Dr. Julien Nordmann recommended high-dose prednisone 1 mg/kg/day.  However, patient has not had further diarrhea since arrival and steroid has not started.  Patient was started on Decadron the next day after discussion with oncology, Dr. Marin Olp.  She was continued on IV fluid.  She has not had further diarrhea since admission.  Mental status improved significantly.  She was oriented x4- date on the day of discharge.  Medication adjustments were made prior to discharge.  She was evaluated by PT/OT who recommended home health PT/OT and light wheelchair.  Patient's husband has been updated throughout her hospitalization and on the day of discharge.  See individual problems below for more.  Discharge Diagnoses:  Acute metabolic encephalopathy:  Suspect this to be due to polypharmacy and possible dehydration versus brain mets. Head CT without acute finding but multiple brain mets as noted on recent MRI.  She is on gabapentin, Compazine, Ativan and high-dose Cymbalta which could contribute. Back to baseline on the day of discharge.  Hemodynamically stable.  UA reveals rare bacteria but no UTI symptoms.  CXR with chronic consolidation and volume loss in LUL outside of known cancer.    -Reduce Cymbalta to 30 mg daily -Reduce gabapentin to 300 mg nightly -Low-dose Ativan as needed -Resume home Compazine on discharge. -Started on Decadron and discharged on 4 mg daily for 7 days -Home health PT/OT and light wheelchair -Patient has upcoming appointment with oncology in a week.  "Diarrhea"/dehydration: Reportedly had about 2 BMs every 1 to 2 days prior to admission. Concern this could be related to her immunotherapy but no further diarrhea since here.  No leukocytosis or abdominal tenderness to suggest infectious process.  She has not had further diarrhea since admission.  History of lung cancer with brain mets: Status post palliative radiotherapy to upper lung mass, systemic chemo 2018-2019.  Currently on immunotherapy with nivolumab -Per oncology.  -Steroid as above  Chronic COPD: Stable.  Not on oxygen. -PRN duo nebs  Hypertension: Normotensive.  Not on meds. -PRN meds  Hyponatremia: Suspect this to be due to SIADH.  Serum sodium dropped to 129 the morning of discharge.  IV normal saline stopped.  Repeat sodium 130 prior to discharge.  Urine chemistry pending at time of discharge. -Recommend BMP at follow-up. -Anticipate improvement with the steroid.  Discharge Instructions  Discharge Instructions    Call MD for:  difficulty breathing, headache or visual disturbances   Complete by: As directed    Call MD for:  extreme fatigue   Complete by: As directed    Call MD for:  persistant dizziness or light-headedness  Complete by: As directed    Call MD for:  persistant nausea and vomiting   Complete by: As directed    Call MD for:  temperature >100.4   Complete by: As directed    Diet  general   Complete by: As directed    Discharge instructions   Complete by: As directed    It has been a pleasure taking care of you! You were admitted with altered mental status, generalized weakness and diarrhea.  Your symptoms improved with hydration and adjustment to you medications to the point we think it is safe to let you go home and follow-up with your primary care doctor and your oncologist. Please review your new medication list and the directions before you take your medications. Please call your primary care office  as soon as possible to schedule hospital follow-up visit in 1 to 2 weeks. Please keep your follow-up appointments with your oncologist.  Take care,   Increase activity slowly   Complete by: As directed      Allergies as of 02/12/2019      Reactions   Fosamax [alendronate]    Hydrocodone-acetaminophen Nausea And Vomiting   EXTREME VOMITING    Lisinopril-hydrochlorothiazide Other (See Comments)   Chest spasms      Medication List    TAKE these medications   acetaminophen 325 MG tablet Commonly known as: TYLENOL Take 325 mg by mouth every 6 (six) hours as needed for mild pain or moderate pain.   Albuterol Sulfate 108 (90 Base) MCG/ACT Aepb Inhale 2 puffs into the lungs every 6 (six) hours as needed.   cholecalciferol 25 MCG (1000 UT) tablet Commonly known as: VITAMIN D3 Take 1,000 Units by mouth daily.   dexamethasone 4 MG tablet Commonly known as: DECADRON Take 1 tablet (4 mg total) by mouth daily.   DULoxetine 30 MG capsule Commonly known as: CYMBALTA Take 1 capsule (30 mg total) by mouth daily. Start taking on: February 13, 2019 What changed:   medication strength  how much to take   feeding supplement (ENSURE ENLIVE) Liqd Take 237 mLs by mouth 2 (two) times daily between meals.   FISH OIL OMEGA-3 PO Take 1 capsule by mouth daily.   gabapentin 300 MG capsule Commonly known as: NEURONTIN Take 1 capsule (300 mg total) by mouth at  bedtime. What changed:   medication strength  how much to take   LORazepam 0.5 MG tablet Commonly known as: ATIVAN Take 1 tablet (0.5 mg total) by mouth as needed for anxiety (take one tablet 30 minutes prior to MRI scans and radiation treatment).   NIVOLUMAB IV Inject 1 Dose into the vein every 28 (twenty-eight) days.   prochlorperazine 10 MG tablet Commonly known as: COMPAZINE Take 1 tablet (10 mg total) by mouth every 6 (six) hours as needed for nausea or vomiting.            Durable Medical Equipment  (From admission, onward)         Start     Ordered   02/12/19 1124  For home use only DME lightweight manual wheelchair with seat cushion  Once    Comments: Patient suffers from home which impairs their ability to perform daily activities like bathing and toileting in the home.  A walker will not resolve  issue with performing activities of daily living. A wheelchair will allow patient to safely perform daily activities. Patient is not able to propel themselves in the home using a standard weight wheelchair due  to arm weakness. Patient can self propel in the lightweight wheelchair. Length of need 6 months . Accessories: elevating leg rests (ELRs), wheel locks, extensions and anti-tippers.   02/12/19 1123         Follow-up Information    Curt Bears, MD. Schedule an appointment as soon as possible for a visit in 1 week(s).   Specialty: Oncology Contact information: [place] [address] [phone]           Consultations:  Oncology  Procedures/Studies:  2D Echo: None  Ct Head Wo Contrast  Result Date: 02/10/2019 CLINICAL DATA:  Altered level of consciousness. EXAM: CT HEAD WITHOUT CONTRAST TECHNIQUE: Contiguous axial images were obtained from the base of the skull through the vertex without intravenous contrast. COMPARISON:  Brain MRI January 28, 2019 FINDINGS: Brain: No evidence of acute infarction, or hemorrhage. Stable to  mildly increased dilation of the ventricles, accounting for cross modality comparison. Multiple brain metastatic lesions previously demonstrated by MRI are poorly seen. Moderate brain parenchymal volume loss and deep white matter areas of hypoattenuation, which in correlation to recent brain MRI were thought to represent combination of sequela of radiation therapy and microvascular ischemia. Stable sequela of prior lacunar left thalamic infarct. Vascular: Intracranial calcific atherosclerotic disease. Skull: Normal. Negative for fracture or focal lesion. Sinuses/Orbits: No acute finding. Other: None. IMPRESSION: 1. No acute intracranial abnormality. 2. Stable to mildly increased dilation of the ventricles. 3. Multiple brain metastatic lesions previously demonstrated by MRI are poorly seen. 4. Moderate brain parenchymal volume loss and deep white matter areas of hypoattenuation, which in correlation to recent brain MRI were thought to represent combination of sequela of radiation therapy and microvascular ischemia. Electronically Signed   By: Fidela Salisbury M.D.   On: 02/10/2019 16:30   Ct Chest W Contrast  Result Date: 01/17/2019 CLINICAL DATA:  Non-small cell lung cancer. Original diagnosis 2018. Immunotherapy ongoing. Completion of chemo radiation therapy. EXAM: CT CHEST, ABDOMEN, AND PELVIS WITH CONTRAST TECHNIQUE: Multidetector CT imaging of the chest, abdomen and pelvis was performed following the standard protocol during bolus administration of intravenous contrast. CONTRAST:  144mL ISOVUE-300 IOPAMIDOL (ISOVUE-300) INJECTION 61% COMPARISON:  CT 11/02/2018, 08/10/2018 FINDINGS: CT CHEST FINDINGS Cardiovascular: No significant vascular findings. Normal heart size. No pericardial effusion. Aberrant RIGHT subclavian artery. Atherosclerotic calcification of the aorta. Mediastinum/Nodes: No axillary supraclavicular adenopathy. No mediastinal adenopathy. The esophagus is somewhat ill-defined along its  border which likely relates radiation change. Lungs/Pleura: Again demonstrated collapse of the LEFT upper lobe. The collapsed portion measures approximately 5.1 cm compared to 5.9 cm on prior. The central necrotic portion which measures 2.3 x 1.7 cm compared to 3.7 x 2.1 cm for some retraction. No new lesion evident. There is a moderate LEFT effusion which layers dependently and not changed in volume compared to prior. There is diffuse subpleural reticulation in LEFT and RIGHT lung. No new suspicious nodularity. Musculoskeletal: No aggressive osseous lesion. CT ABDOMEN AND PELVIS FINDINGS Hepatobiliary: No biliary duct dilatation. Tiny subcapsular lesion in the RIGHT hepatic lobe (image 61/2 is unchanged. No evidence of metastasis Pancreas: No pancreatic inflammation or mass Spleen: Normal spleen Adrenals/urinary tract: Thickening LEFT adrenal gland to 1.4 cm (image 53/2) compares to 1.1 cm on most recent CT scan and 1.2 cm on CT 08/10/2018. RIGHT adrenal glands normal. Stomach/Bowel: Stomach, small bowel, appendix, and cecum are normal. The colon and rectosigmoid colon are normal. Vascular/Lymphatic: Abdominal aorta is normal caliber with atherosclerotic calcification. There is no retroperitoneal or periportal lymphadenopathy.  No pelvic lymphadenopathy. Reproductive: Post hysterectomy Other: No peritoneal metastasis Musculoskeletal: No aggressive osseous lesion. IMPRESSION: Chest Impression: 1. LEFT upper lobe collapse with central necrosis is contracted somewhat compared to prior. 2. Persistent small LEFT effusion. 3. No evidence of metastatic progression in the thorax. Abdomen / Pelvis Impression: 1. Prominent thickening of the LEFT adrenal gland similar to comparison exams. Recommend attention on follow-up. 2. No evidence of liver metastasis. 3. No metastatic adenopathy in the abdomen pelvis. Electronically Signed   By: Suzy Bouchard M.D.   On: 01/17/2019 16:01   Mr [name] GN Contrast  Result Date:  01/28/2019 CLINICAL DATA:  Lung cancer with brain metastases previously treated with SRS. EXAM: MRI HEAD WITHOUT AND WITH CONTRAST TECHNIQUE: Multiplanar, multiecho pulse sequences of the brain and surrounding structures were obtained without and with intravenous contrast. CONTRAST:  44mL MULTIHANCE GADOBENATE DIMEGLUMINE 529 MG/ML IV SOLN COMPARISON:  MRI brain 10/22/2018 and 07/23/2018 FINDINGS: BRAIN New Lesions: None. Larger lesions: 5. Enhancing lesion located left occipital pole at the posterior horn of the left lateral ventricle has increased from 9 x 8 x 9 mm to now 15 x 12 x 12 mm, with new or increased edema and mass effect seen on 11, 53. Enhancing lesion located right temporal lobe has increased from 9 x 7 mm to now 11 x 6 mm, without new or increased edema and mass effect seen on 11, 57. Enhancing lesion located right occipital lobe along the posterior horn of the right lateral ventricle has increased from 6.5 mm to now 8.5 mm, with new or increased edema and mass effect seen on treated. Enhancing lesion located right occipital lobe has increased from 3.6 mm to now 7.0 mm, with new or increased edema and mass effect seen on 11, 81. Enhancing lesion located in the posterior left insula has increased from 9 mm to now 11 mm, without new or increased edema and mass effect seen on 11, 84. Stable or Smaller lesions: 1 Six mm enhancing lesion located left temporal lobe and seen on 11, 70. Other Brain findings: Moderate atrophy and diffuse confluent periventricular white matter changes are again seen bilaterally. Ventricular enlargement is stable. No significant extra-axial fluid collection is present. Chronic white matter changes extend into the brainstem. Vascular: Flow is present in the major intracranial arteries. Skull and upper cervical spine: The craniocervical junction is normal. Upper cervical spine is within normal limits. Marrow signal is unremarkable. Sinuses/Orbits: The paranasal sinuses and  mastoid air cells are clear. IMPRESSION: 1. Progression of, Stable, Regression of disease as evidenced by progressive size of multiple previously treated lesions. 2. Total of 6 enhancing brain metastases, each annotated on 11. 3. Stable chronic atrophy and diffuse white matter disease. This likely represents a combination of prior radiation therapy and microvascular ischemia 4. Remote lacunar infarcts of the left thalamus and basal ganglia. Electronically Signed   By: San Morelle M.D.   On: 01/28/2019 20:45   Ct Abdomen Pelvis W Contrast  Result Date: 01/17/2019 CLINICAL DATA:  Non-small cell lung cancer. Original diagnosis 2018. Immunotherapy ongoing. Completion of chemo radiation therapy. EXAM: CT CHEST, ABDOMEN, AND PELVIS WITH CONTRAST TECHNIQUE: Multidetector CT imaging of the chest, abdomen and pelvis was performed following the standard protocol during bolus administration of intravenous contrast. CONTRAST:  147mL ISOVUE-300 IOPAMIDOL (ISOVUE-300) INJECTION 61% COMPARISON:  CT 11/02/2018, 08/10/2018 FINDINGS: CT CHEST FINDINGS Cardiovascular: No significant vascular findings. Normal heart size. No pericardial effusion. Aberrant RIGHT subclavian artery. Atherosclerotic calcification of the aorta. Mediastinum/Nodes: No  axillary supraclavicular adenopathy. No mediastinal adenopathy. The esophagus is somewhat ill-defined along its border which likely relates radiation change. Lungs/Pleura: Again demonstrated collapse of the LEFT upper lobe. The collapsed portion measures approximately 5.1 cm compared to 5.9 cm on prior. The central necrotic portion which measures 2.3 x 1.7 cm compared to 3.7 x 2.1 cm for some retraction. No new lesion evident. There is a moderate LEFT effusion which layers dependently and not changed in volume compared to prior. There is diffuse subpleural reticulation in LEFT and RIGHT lung. No new suspicious nodularity. Musculoskeletal: No aggressive osseous lesion. CT ABDOMEN AND  PELVIS FINDINGS Hepatobiliary: No biliary duct dilatation. Tiny subcapsular lesion in the RIGHT hepatic lobe (image 61/2 is unchanged. No evidence of metastasis Pancreas: No pancreatic inflammation or mass Spleen: Normal spleen Adrenals/urinary tract: Thickening LEFT adrenal gland to 1.4 cm (image 53/2) compares to 1.1 cm on most recent CT scan and 1.2 cm on CT 08/10/2018. RIGHT adrenal glands normal. Stomach/Bowel: Stomach, small bowel, appendix, and cecum are normal. The colon and rectosigmoid colon are normal. Vascular/Lymphatic: Abdominal aorta is normal caliber with atherosclerotic calcification. There is no retroperitoneal or periportal lymphadenopathy. No pelvic lymphadenopathy. Reproductive: Post hysterectomy Other: No peritoneal metastasis Musculoskeletal: No aggressive osseous lesion. IMPRESSION: Chest Impression: 1. LEFT upper lobe collapse with central necrosis is contracted somewhat compared to prior. 2. Persistent small LEFT effusion. 3. No evidence of metastatic progression in the thorax. Abdomen / Pelvis Impression: 1. Prominent thickening of the LEFT adrenal gland similar to comparison exams. Recommend attention on follow-up. 2. No evidence of liver metastasis. 3. No metastatic adenopathy in the abdomen pelvis. Electronically Signed   By: Suzy Bouchard M.D.   On: 01/17/2019 16:01   Dg Chest Port 1 View  Result Date: 02/10/2019 CLINICAL DATA:  Acute metabolic encephalopathy. Confusion. EXAM: PORTABLE CHEST 1 VIEW COMPARISON:  CT scan of the chest dated 01/17/2019 FINDINGS: The heart size and pulmonary vascularity are normal. Aortic atherosclerosis. There is chronic consolidation and volume loss in the left upper lobe at the site of the known non-small cell carcinoma. There is also chronic elevation of the left hemidiaphragm. Right lung is clear. No acute bone abnormality. Old healed fracture of the proximal right humerus. IMPRESSION: 1. Chronic consolidation and volume loss in the left upper  lobe at the site of the known non-small cell carcinoma. Chronic elevation of the left hemidiaphragm. 2. No acute abnormalities. 3. Aortic atherosclerosis. Electronically Signed   By: Lorriane Shire M.D.   On: 02/10/2019 19:49      Subjective: No major events overnight of this morning.  Has no complaints.  She denies headache, vision change, chest pain, dyspnea, nausea, vomiting, abdominal pain, diarrhea or GU symptoms.   Discharge Exam: Vitals:   02/11/19 2148 02/12/19 0502  BP: 136/72 125/74  Pulse: 82 68  Resp:    Temp:  98.1 F (36.7 C)  SpO2: 96% 92%    GENERAL: No acute distress.  Appears well.  HEENT: MMM.  Vision and hearing grossly intact.  NECK: Supple.  No JVD.  LUNGS:  No IWOB. Good air movement bilaterally. HEART:  RRR. Heart sounds normal.  ABD: Bowel sounds present. Soft. Non tender.  MSK/EXT:  Moves all extremities. No apparent deformity. No edema bilaterally. SKIN: no apparent skin lesion or wound NEURO: Awake, alert and oriented x4- date..  No gross deficit.  PSYCH: Calm. Normal affect.     The results of significant diagnostics from this hospitalization (including imaging, microbiology, ancillary and laboratory) are  listed below for reference.     Microbiology: Recent Results (from the past 240 hour(s))  SARS Coronavirus 2 [facility] order, Performed in [facility] hospital lab) Nasopharyngeal Nasopharyngeal Swab     Status: None   Collection Time: 02/10/19  9:23 PM   Specimen: Nasopharyngeal Swab  Result Value Ref Range Status   SARS Coronavirus 2 NEGATIVE NEGATIVE Final    Comment: (NOTE) If result is NEGATIVE SARS-CoV-2 target nucleic acids are NOT DETECTED. The SARS-CoV-2 RNA is generally detectable in upper and lower  respiratory specimens during the acute phase of infection. The lowest  concentration of SARS-CoV-2 viral copies this assay can detect is 250  copies / mL. A negative result does not preclude SARS-CoV-2 infection  and should not be  used as the sole basis for treatment or other  patient management decisions.  A negative result may occur with  improper specimen collection / handling, submission of specimen other  than nasopharyngeal swab, presence of viral mutation(s) within the  areas targeted by this assay, and inadequate number of viral copies  (<250 copies / mL). A negative result must be combined with clinical  observations, patient history, and epidemiological information. If result is POSITIVE SARS-CoV-2 target nucleic acids are DETECTED. The SARS-CoV-2 RNA is generally detectable in upper and lower  respiratory specimens dur ing the acute phase of infection.  Positive  results are indicative of active infection with SARS-CoV-2.  Clinical  correlation with patient history and other diagnostic information is  necessary to determine patient infection status.  Positive results do  not rule out bacterial infection or co-infection with other viruses. If result is PRESUMPTIVE POSTIVE SARS-CoV-2 nucleic acids MAY BE PRESENT.   A presumptive positive result was obtained on the submitted specimen  and confirmed on repeat testing.  While 2019 novel coronavirus  (SARS-CoV-2) nucleic acids may be present in the submitted sample  additional confirmatory testing may be necessary for epidemiological  and / or clinical management purposes  to differentiate between  SARS-CoV-2 and other Sarbecovirus currently known to infect humans.  If clinically indicated additional testing with an alternate test  methodology [id number]) is advised. The SARS-CoV-2 RNA is generally  detectable in upper and lower respiratory sp ecimens during the acute  phase of infection. The expected result is Negative. Fact Sheet for Patients:  StrictlyIdeas.no Fact Sheet for Healthcare Providers: BankingDealers.co.za This test is not yet approved or cleared by the [place] FDA and has been authorized  for detection and/or diagnosis of SARS-CoV-2 by FDA under an Emergency Use Authorization (EUA).  This EUA will remain in effect (meaning this test can be used) for the duration of the COVID-19 declaration under Section 564(b)(1) of the Act, 21 U.S.C. section 360bbb-3(b)(1), unless the authorization is terminated or revoked sooner. Performed at [facility], [address] [place], [place] [address]      Labs: BNP (last 3 results) No results for input(s): BNP in the last 8760 hours. Basic Metabolic Panel: Recent Labs  Lab 02/10/19 1537 02/11/19 0444 02/12/19 0522 02/12/19 0823  NA 134* 135 129* 130*  K 3.5 3.5 4.1  --   CL 101 103 99  --   CO2 27 25 22   --   GLUCOSE 106* 92 137*  --   BUN 10 7* 7*  --   CREATININE 0.53 0.53 0.37*  --   CALCIUM 8.4* 8.1* 8.4*  --   MG 2.0  --  1.9  --    Liver Function Tests: Recent  Labs  Lab 02/10/19 1537 02/12/19 0522  AST 18 19  ALT 13 13  ALKPHOS 76 79  BILITOT 0.6 1.3*  PROT 7.0 7.1  ALBUMIN 3.1* 3.0*   No results for input(s): LIPASE, AMYLASE in the last 168 hours. Recent Labs  Lab 02/10/19 1537  AMMONIA <9*   CBC: Recent Labs  Lab 02/10/19 1537 02/12/19 0522  WBC 6.3 3.9*  NEUTROABS 4.7  --   HGB 11.2* 12.0  HCT 35.1* 36.4  MCV 102.6* 102.2*  PLT 147* 141*   Cardiac Enzymes: No results for input(s): CKTOTAL, CKMB, CKMBINDEX, TROPONINI in the last 168 hours. BNP: Invalid input(s): POCBNP CBG: Recent Labs  Lab 02/10/19 1730  GLUCAP 95   D-Dimer No results for input(s): DDIMER in the last 72 hours. Hgb A1c No results for input(s): HGBA1C in the last 72 hours. Lipid Profile No results for input(s): CHOL, HDL, LDLCALC, TRIG, CHOLHDL, LDLDIRECT in the last 72 hours. Thyroid function studies Recent Labs    02/12/19 0522  TSH 0.521   Anemia work up Recent Labs    02/12/19 0522  VITAMINB12 570   Urinalysis    Component Value Date/Time   COLORURINE YELLOW 02/10/2019 1519   APPEARANCEUR CLOUDY (A)  02/10/2019 1519   APPEARANCEUR Clear 07/04/2016 1620   LABSPEC 1.013 02/10/2019 1519   PHURINE 7.0 02/10/2019 1519   GLUCOSEU NEGATIVE 02/10/2019 1519   HGBUR NEGATIVE 02/10/2019 1519   BILIRUBINUR NEGATIVE 02/10/2019 1519   BILIRUBINUR Negative 07/04/2016 1620   KETONESUR NEGATIVE 02/10/2019 1519   PROTEINUR NEGATIVE 02/10/2019 1519   NITRITE NEGATIVE 02/10/2019 1519   LEUKOCYTESUR NEGATIVE 02/10/2019 1519   Sepsis Labs Invalid input(s): PROCALCITONIN,  WBC,  LACTICIDVEN   Time coordinating discharge: 35 minutes  SIGNED:  Mercy Riding, MD  [facility] 02/12/2019, 11:28 AM  If 7PM-7AM, please contact night-coverage [email or web address] Password [email or web address]

## 2019-02-12 NOTE — Progress Notes (Signed)
Patient discharged to home with husband, discharge instructions reviewed with patient and husband who verbalized understanding.

## 2019-02-12 NOTE — TOC Progression Note (Signed)
Transition of Care [facility]) - Progression Note    Patient Details  Name: Jacqueline Kidd MRN: [id number] Date of Birth: 05-Feb-1941  Transition of Care [facility]) CM/SW Contact  Joaquin Courts, RN Phone Number: 02/12/2019, 11:42 AM  Clinical Narrative:    CM spoke with patient. [facility] with [facility] for HHPT and OT. Patient declines manual wheelchair at this time.  CM explained to pt that should she change her mind after discharge, she can reach out to her primary care office and they will assist her. CM offered to speak with patient's spouse about this which she declined.     Expected Discharge Plan: [place] Barriers to Discharge: No Barriers Identified  Expected Discharge Plan and Services Expected Discharge Plan: [place]   Discharge Planning Services: CM Consult Post Acute Care Choice: [place] arrangements for the past 2 months: Single Family Home Expected Discharge Date: 02/12/19               DME Arranged: N/A DME Agency: NA       HH Arranged: OT, PT [place] Agency: [place] ([place]) Date [place]: 02/12/19 Time [place]: 1142 Representative spoke with at [place]: [place] ([place]) Interventions    Readmission Risk Interventions No flowsheet data found.

## 2019-02-12 NOTE — Evaluation (Signed)
Physical Therapy Evaluation Patient Details Name: Jacqueline Kidd MRN: [id number] DOB: [birth year] Today's Date: 02/12/2019   History of Present Illness  This 78 y.o. female with h/o lung CA with brain mets, admitted with diarrhea, weakness, confusion, and lethargy. MRI of brain showed 1. No acute intracranial abnormality; 2. Stable to mildly increased dilation of the ventricles; 3. Multiple brain metastatic lesions previously demonstrated by MRI are poorly seen; 4. Moderate brain parenchymal volume loss and deep white matter areas of hypoattenuation, which in correlation to recent brain MRI were thought to represent combination of sequela of radiation therapy and microvascular ischemia. Dx: acute metabolic encephalopathy.  PMH includes; COPD, Chronic LBP   Clinical Impression  Pt admitted as above and presenting with functional mobility limitations 2* generalized weakness and ambulatory balance deficits.  Pt should progress to dc home but currently would require 24/7 assist and would benefit from follow up HHPT to further address deficits.    Follow Up Recommendations Home health PT;Supervision/Assistance - 24 hour    Equipment Recommendations  None recommended by PT    Recommendations for Other Services       Precautions / Restrictions Precautions Precautions: Fall Restrictions Weight Bearing Restrictions: No      Mobility  Bed Mobility Overal bed mobility: Needs Assistance Bed Mobility: Supine to Sit     Supine to sit: Modified independent (Device/Increase time)        Transfers Overall transfer level: Needs assistance Equipment used: Rolling walker (2 wheeled)   Sit to Stand: Min guard         General transfer comment: assist for safety, pt moves impulsively  Ambulation/Gait Ambulation/Gait assistance: Min assist;Min guard Gait Distance (Feet): 250 Feet Assistive device: Rolling walker (2 wheeled);1 person hand held assist Gait Pattern/deviations: Step-through  pattern;Shuffle;Trunk flexed;Staggering left;Staggering right     General Gait Details: Pt ambulated 125' with HHA for balance and additional 125' with RW - marked improvement in stability with AD.  Pt demonstrating mild-mod SOB with mask in place but SaO2 @ 93%  Stairs            Wheelchair Mobility    Modified Rankin (Stroke Patients Only)       Balance Overall balance assessment: Needs assistance Sitting-balance support: Feet supported;No upper extremity supported Sitting balance-Leahy Scale: Good     Standing balance support: No upper extremity supported Standing balance-Leahy Scale: Fair                               Pertinent Vitals/Pain Pain Assessment: No/denies pain    Home Living Family/patient expects to be discharged to:: Private residence Living Arrangements: Spouse/significant other Available Help at Discharge: Family;Available 24 hours/day Type of Home: House Home Access: Level entry     Home Layout: One level Home Equipment: Clinical cytogeneticist - 2 wheels;Cane - single point Additional Comments: Pt states she lives with husband who is in fair health and has assist as needed from daughter in law    Prior Function Level of Independence: Needs assistance   Gait / Transfers Assistance Needed: used cane or RW as needed but mostly no assistive device  ADL's / Homemaking Assistance Needed: Daughter in Sports coach assisted with some meals and laundry        Hand Dominance   Dominant Hand: Right    Extremity/Trunk Assessment   Upper Extremity Assessment Upper Extremity Assessment: Generalized weakness    Lower Extremity Assessment Lower Extremity Assessment: Generalized weakness  Communication   Communication: No difficulties  Cognition Arousal/Alertness: Awake/alert Behavior During Therapy: Impulsive Overall Cognitive Status: Within Functional Limits for tasks assessed                                 General  Comments: Marked improvement from OT session yesterday.  Pt aware oriented to person and place and able to describe home setting, assist available.      General Comments      Exercises     Assessment/Plan    PT Assessment Patient needs continued PT services  PT Problem List Decreased strength;Decreased activity tolerance;Decreased balance;Decreased mobility;Decreased knowledge of use of DME       PT Treatment Interventions DME instruction;Gait training;Functional mobility training;Therapeutic activities;Therapeutic exercise;Balance training;Patient/family education    PT Goals (Current goals can be found in the Care Plan section)  Acute Rehab PT Goals Patient Stated Goal: Home PT Goal Formulation: With patient Time For Goal Achievement: 02/26/19 Potential to Achieve Goals: Good    Frequency Min 3X/week   Barriers to discharge        Co-evaluation               AM-PAC PT "6 Clicks" Mobility  Outcome Measure Help needed turning from your back to your side while in a flat bed without using bedrails?: None Help needed moving from lying on your back to sitting on the side of a flat bed without using bedrails?: None Help needed moving to and from a bed to a chair (including a wheelchair)?: A Little Help needed standing up from a chair using your arms (e.g., wheelchair or bedside chair)?: A Little Help needed to walk in hospital room?: A Little Help needed climbing 3-5 steps with a railing? : A Little 6 Click Score: 20    End of Session Equipment Utilized During Treatment: Gait belt Activity Tolerance: Patient tolerated treatment well Patient left: in chair;with call bell/phone within reach;with chair alarm set Nurse Communication: Mobility status PT Visit Diagnosis: Muscle weakness (generalized) (M62.81);Difficulty in walking, not elsewhere classified (R26.2)    Time: 4403-4742 PT Time Calculation (min) (ACUTE ONLY): 25 min   Charges:   PT Evaluation $PT Eval  Low Complexity: 1 Low PT Treatments $Gait Training: 8-22 mins        Debe Coder PT Acute Rehabilitation Services Pager [phone] Office [phone]   Zorawar Strollo 02/12/2019, 12:51 PM

## 2019-02-14 ENCOUNTER — Encounter (HOSPITAL_COMMUNITY): Payer: Self-pay | Admitting: Emergency Medicine

## 2019-02-14 ENCOUNTER — Emergency Department (HOSPITAL_COMMUNITY): Payer: Medicare Other

## 2019-02-14 ENCOUNTER — Other Ambulatory Visit: Payer: Self-pay

## 2019-02-14 ENCOUNTER — Emergency Department (HOSPITAL_COMMUNITY)
Admission: EM | Admit: 2019-02-14 | Discharge: 2019-02-17 | Disposition: A | Discharge status: Rehabilitation Facility | Payer: Medicare Other | Attending: Emergency Medicine | Admitting: Emergency Medicine

## 2019-02-14 DIAGNOSIS — M25552 Pain in left hip: Secondary | ICD-10-CM | POA: Insufficient documentation

## 2019-02-14 DIAGNOSIS — S82045A Nondisplaced comminuted fracture of left patella, initial encounter for closed fracture: Secondary | ICD-10-CM | POA: Diagnosis not present

## 2019-02-14 DIAGNOSIS — I10 Essential (primary) hypertension: Secondary | ICD-10-CM | POA: Diagnosis not present

## 2019-02-14 DIAGNOSIS — C7931 Secondary malignant neoplasm of brain: Secondary | ICD-10-CM | POA: Diagnosis not present

## 2019-02-14 DIAGNOSIS — M25462 Effusion, left knee: Secondary | ICD-10-CM | POA: Diagnosis not present

## 2019-02-14 DIAGNOSIS — J449 Chronic obstructive pulmonary disease, unspecified: Secondary | ICD-10-CM | POA: Diagnosis not present

## 2019-02-14 DIAGNOSIS — S82042A Displaced comminuted fracture of left patella, initial encounter for closed fracture: Secondary | ICD-10-CM | POA: Diagnosis not present

## 2019-02-14 DIAGNOSIS — Y939 Activity, unspecified: Secondary | ICD-10-CM | POA: Insufficient documentation

## 2019-02-14 DIAGNOSIS — Y929 Unspecified place or not applicable: Secondary | ICD-10-CM | POA: Insufficient documentation

## 2019-02-14 DIAGNOSIS — S82002A Unspecified fracture of left patella, initial encounter for closed fracture: Secondary | ICD-10-CM

## 2019-02-14 DIAGNOSIS — M6281 Muscle weakness (generalized): Secondary | ICD-10-CM | POA: Diagnosis not present

## 2019-02-14 DIAGNOSIS — Y999 Unspecified external cause status: Secondary | ICD-10-CM | POA: Insufficient documentation

## 2019-02-14 DIAGNOSIS — R51 Headache: Secondary | ICD-10-CM | POA: Insufficient documentation

## 2019-02-14 DIAGNOSIS — M545 Low back pain: Secondary | ICD-10-CM | POA: Diagnosis not present

## 2019-02-14 DIAGNOSIS — Z85118 Personal history of other malignant neoplasm of bronchus and lung: Secondary | ICD-10-CM | POA: Insufficient documentation

## 2019-02-14 DIAGNOSIS — S79912A Unspecified injury of left hip, initial encounter: Secondary | ICD-10-CM | POA: Diagnosis not present

## 2019-02-14 DIAGNOSIS — M25519 Pain in unspecified shoulder: Secondary | ICD-10-CM | POA: Diagnosis not present

## 2019-02-14 DIAGNOSIS — R0902 Hypoxemia: Secondary | ICD-10-CM | POA: Diagnosis not present

## 2019-02-14 DIAGNOSIS — W19XXXA Unspecified fall, initial encounter: Secondary | ICD-10-CM | POA: Insufficient documentation

## 2019-02-14 DIAGNOSIS — Z87891 Personal history of nicotine dependence: Secondary | ICD-10-CM | POA: Diagnosis not present

## 2019-02-14 DIAGNOSIS — M810 Age-related osteoporosis without current pathological fracture: Secondary | ICD-10-CM | POA: Diagnosis not present

## 2019-02-14 DIAGNOSIS — R918 Other nonspecific abnormal finding of lung field: Secondary | ICD-10-CM | POA: Diagnosis not present

## 2019-02-14 DIAGNOSIS — R296 Repeated falls: Secondary | ICD-10-CM | POA: Diagnosis not present

## 2019-02-14 DIAGNOSIS — G8929 Other chronic pain: Secondary | ICD-10-CM | POA: Diagnosis not present

## 2019-02-14 DIAGNOSIS — Z20828 Contact with and (suspected) exposure to other viral communicable diseases: Secondary | ICD-10-CM | POA: Insufficient documentation

## 2019-02-14 DIAGNOSIS — C3492 Malignant neoplasm of unspecified part of left bronchus or lung: Secondary | ICD-10-CM | POA: Diagnosis not present

## 2019-02-14 DIAGNOSIS — Z9181 History of falling: Secondary | ICD-10-CM | POA: Diagnosis not present

## 2019-02-14 DIAGNOSIS — R531 Weakness: Secondary | ICD-10-CM

## 2019-02-14 DIAGNOSIS — E860 Dehydration: Secondary | ICD-10-CM | POA: Diagnosis not present

## 2019-02-14 DIAGNOSIS — E785 Hyperlipidemia, unspecified: Secondary | ICD-10-CM | POA: Diagnosis not present

## 2019-02-14 DIAGNOSIS — R52 Pain, unspecified: Secondary | ICD-10-CM | POA: Diagnosis not present

## 2019-02-14 DIAGNOSIS — Z79899 Other long term (current) drug therapy: Secondary | ICD-10-CM | POA: Diagnosis not present

## 2019-02-14 LAB — URINALYSIS, ROUTINE W REFLEX MICROSCOPIC
Bilirubin Urine: NEGATIVE
Glucose, UA: NEGATIVE mg/dL
Ketones, ur: NEGATIVE mg/dL
Nitrite: NEGATIVE
Protein, ur: NEGATIVE mg/dL
Specific Gravity, Urine: 1.006 (ref 1.005–1.030)
pH: 8 (ref 5.0–8.0)

## 2019-02-14 LAB — COMPREHENSIVE METABOLIC PANEL
ALT: 22 U/L (ref 0–44)
AST: 22 U/L (ref 15–41)
Albumin: 3.2 g/dL — ABNORMAL LOW (ref 3.5–5.0)
Alkaline Phosphatase: 74 U/L (ref 38–126)
Anion gap: 7 (ref 5–15)
BUN: 11 mg/dL (ref 8–23)
CO2: 25 mmol/L (ref 22–32)
Calcium: 8.2 mg/dL — ABNORMAL LOW (ref 8.9–10.3)
Chloride: 97 mmol/L — ABNORMAL LOW (ref 98–111)
Creatinine, Ser: 0.42 mg/dL — ABNORMAL LOW (ref 0.44–1.00)
GFR calc Af Amer: >60 mL/min (ref >60)
GFR calc non Af Amer: >60 mL/min (ref >60)
Glucose, Bld: 150 mg/dL — ABNORMAL HIGH (ref 70–99)
Potassium: 3.9 mmol/L (ref 3.5–5.1)
Sodium: 129 mmol/L — ABNORMAL LOW (ref 135–145)
Total Bilirubin: 0.7 mg/dL (ref 0.3–1.2)
Total Protein: 7.2 g/dL (ref 6.5–8.1)

## 2019-02-14 LAB — CBC WITH DIFFERENTIAL/PLATELET
Abs Immature Granulocytes: 0.08 10*3/uL — ABNORMAL HIGH (ref 0.00–0.07)
Basophils Absolute: 0 10*3/uL (ref 0.0–0.1)
Basophils Relative: 0 %
Eosinophils Absolute: 0 10*3/uL (ref 0.0–0.5)
Eosinophils Relative: 0 %
HCT: 35.1 % — ABNORMAL LOW (ref 36.0–46.0)
Hemoglobin: 11.2 g/dL — ABNORMAL LOW (ref 12.0–15.0)
Immature Granulocytes: 1 %
Lymphocytes Relative: 6 %
Lymphs Abs: 0.6 10*3/uL — ABNORMAL LOW (ref 0.7–4.0)
MCH: 32.4 pg (ref 26.0–34.0)
MCHC: 31.9 g/dL (ref 30.0–36.0)
MCV: 101.4 fL — ABNORMAL HIGH (ref 80.0–100.0)
Monocytes Absolute: 0.4 10*3/uL (ref 0.1–1.0)
Monocytes Relative: 5 %
Neutro Abs: 7.5 10*3/uL (ref 1.7–7.7)
Neutrophils Relative %: 88 %
Platelets: 149 10*3/uL — ABNORMAL LOW (ref 150–400)
RBC: 3.46 MIL/uL — ABNORMAL LOW (ref 3.87–5.11)
RDW: 13.4 % (ref 11.5–15.5)
WBC: 8.6 10*3/uL (ref 4.0–10.5)
nRBC: 0 % (ref 0.0–0.2)

## 2019-02-14 LAB — MAGNESIUM: Magnesium: 1.8 mg/dL (ref 1.7–2.4)

## 2019-02-14 MED ORDER — ONDANSETRON HCL 4 MG/2ML IJ SOLN
4.0000 mg | Freq: Once | INTRAMUSCULAR | Status: DC
  Filled 2019-02-14: qty 2

## 2019-02-14 MED ORDER — OMEGA-3-ACID ETHYL ESTERS 1 G PO CAPS
ORAL_CAPSULE | Freq: Every day | ORAL | Status: DC
  Administered 2019-02-15: 1 g via ORAL
  Administered 2019-02-16 – 2019-02-17 (×2): via ORAL
  Filled 2019-02-14 (×4): qty 1

## 2019-02-14 MED ORDER — DULOXETINE HCL 30 MG PO CPEP
30.0000 mg | ORAL_CAPSULE | Freq: Every day | ORAL | Status: DC
  Administered 2019-02-16 – 2019-02-17 (×2): 30 mg via ORAL
  Filled 2019-02-14 (×4): qty 1

## 2019-02-14 MED ORDER — DEXAMETHASONE 4 MG PO TABS
4.0000 mg | ORAL_TABLET | Freq: Every day | ORAL | Status: DC
  Administered 2019-02-15 – 2019-02-17 (×3): 4 mg via ORAL
  Filled 2019-02-14 (×3): qty 1

## 2019-02-14 MED ORDER — LORAZEPAM 0.5 MG PO TABS: 0.5000 mg | ORAL_TABLET | ORAL | Status: DC | PRN

## 2019-02-14 MED ORDER — GABAPENTIN 300 MG PO CAPS
300.0000 mg | ORAL_CAPSULE | Freq: Every day | ORAL | Status: DC
  Administered 2019-02-15 – 2019-02-16 (×3): 300 mg via ORAL
  Filled 2019-02-14 (×3): qty 1

## 2019-02-14 MED ORDER — PROCHLORPERAZINE MALEATE 5 MG PO TABS: 10.0000 mg | ORAL_TABLET | Freq: Four times a day (QID) | ORAL | Status: DC | PRN

## 2019-02-14 MED ORDER — ENSURE ENLIVE PO LIQD
237.0000 mL | Freq: Two times a day (BID) | ORAL | Status: DC
  Administered 2019-02-15: 14:00:00 237 mL via ORAL
  Filled 2019-02-14 (×8): qty 237

## 2019-02-14 MED ORDER — ACETAMINOPHEN 325 MG PO TABS
325.0000 mg | ORAL_TABLET | Freq: Four times a day (QID) | ORAL | Status: DC | PRN
  Administered 2019-02-16: 22:00:00 325 mg via ORAL
  Filled 2019-02-14: qty 1

## 2019-02-14 MED ORDER — ALBUTEROL SULFATE (2.5 MG/3ML) 0.083% IN NEBU: 3.0000 mL | INHALATION_SOLUTION | Freq: Four times a day (QID) | RESPIRATORY_TRACT | Status: DC | PRN

## 2019-02-14 MED ORDER — FENTANYL CITRATE (PF) 100 MCG/2ML IJ SOLN
50.0000 ug | Freq: Once | INTRAMUSCULAR | Status: DC
  Filled 2019-02-14: qty 2

## 2019-02-14 NOTE — ED Triage Notes (Addendum)
Pt here for on-going generalized weakness and multiple falls. Pt fell last night and this morning. Pt c/o pain to LT shoulder, LT hip, LT ribcage, & side of head. Pt not on blood thinners. Pt does not like to ask for help even though she has been weak and having difficulty ambulating with a walker for a while. Pt given 250 cc NS en route.

## 2019-02-14 NOTE — ED Notes (Signed)
Patient transported to X-ray 

## 2019-02-14 NOTE — ED Provider Notes (Signed)
[facility] EMERGENCY DEPARTMENT Provider Note   CSN: [id number] Arrival date & time: 02/14/19  1433     History   Chief Complaint Chief Complaint  Patient presents with   Weakness    HPI Jacqueline Kidd is a 78 y.o. female.     HPI   78yF with pain in her L side. She said she lost her balance and had two falls earlier today. Didn't want to come to the hospital after just being discharged but just having too much pain and difficulty walking. Pain mostly in L chest and L knee. Did strike her head but no LOC. Mild HA. Also L hip pain.   Past Medical History:  Diagnosis Date   Chronic low back pain    COPD (chronic obstructive pulmonary disease) (HCC)    GERD (gastroesophageal reflux disease)    Hyperlipidemia    Hypertension    Lesion of left lung    lung ca dx'd 01/2017   Osteoporosis     Patient Active Problem List   Diagnosis Date Noted   Acute metabolic encephalopathy 13/24/4010   Encounter for antineoplastic immunotherapy 03/25/2018   Chemotherapy-induced neuropathy ([place]) 02/18/2018   Peripheral neuropathy 11/18/2017   Need for influenza vaccination 04/15/2017   Brain metastases ([place]) 03/30/2017   Goals of care, counseling/discussion 03/23/2017   Encounter for antineoplastic chemotherapy 03/23/2017   Mass of parotid gland 03-Oct-202018   Primary cancer of left upper lobe of lung ([place]) 02/19/2017   Lung mass 02/16/2017   BMI 33.0-33.9,adult 03/21/2016   Peripheral edema 03/21/2016   Osteoporosis 06/21/2014   Hypertension 01/21/2013    Past Surgical History:  Procedure Laterality Date   ABDOMINAL HYSTERECTOMY     TOTAL HIP ARTHROPLASTY     right     OB History   No obstetric history on file.      Home Medications    Prior to Admission medications   Medication Sig Start Date End Date Taking? Authorizing Provider  acetaminophen (TYLENOL) 325 MG tablet Take 325 mg by mouth every 6 (six) hours as needed for mild pain or moderate  pain.    Yes [provider]  Albuterol Sulfate 108 (90 Base) MCG/ACT AEPB Inhale 2 puffs into the lungs every 6 (six) hours as needed. 09/28/17  Yes [name], MD  cholecalciferol (VITAMIN D3) 25 MCG (1000 UT) tablet Take 1,000 Units by mouth daily.   Yes [provider]  dexamethasone (DECADRON) 4 MG tablet Take 1 tablet (4 mg total) by mouth daily. 02/12/19  Yes Mercy Riding, MD  DULoxetine (CYMBALTA) 30 MG capsule Take 1 capsule (30 mg total) by mouth daily. 02/13/19  Yes Mercy Riding, MD  feeding supplement, ENSURE ENLIVE, (ENSURE ENLIVE) LIQD Take 237 mLs by mouth 2 (two) times daily between meals. 02/12/19 03/14/19 Yes Mercy Riding, MD  gabapentin (NEURONTIN) 300 MG capsule Take 1 capsule (300 mg total) by mouth at bedtime. 02/12/19  Yes Mercy Riding, MD  LORazepam (ATIVAN) 0.5 MG tablet Take 1 tablet (0.5 mg total) by mouth as needed for anxiety (take one tablet 30 minutes prior to MRI scans and radiation treatment). 10/13/17  Yes Bruning, Ashlyn, PA-C  NIVOLUMAB IV Inject 1 Dose into the vein every 28 (twenty-eight) days.   Yes [provider]  Omega-3 Fatty Acids (FISH OIL OMEGA-3 PO) Take 1 capsule by mouth daily.    Yes [provider]  prochlorperazine (COMPAZINE) 10 MG tablet Take 1 tablet (10 mg total) by  mouth every 6 (six) hours as needed for nausea or vomiting. 01/27/19  Yes Curt Bears, MD    Family History Family History  Problem Relation Age of Onset   Cancer Mother        breast   Cancer Father        esophageal   Cancer Sister        breast    Social History Social History   Tobacco Use   Smoking status: Former Smoker    Packs/day: 1.00    Years: 57.00    Pack years: 57.00    Quit date: 02/16/2017    Years since quitting: 1.9   Smokeless tobacco: Never Used  Substance Use Topics   Alcohol use: No   Drug use: No     Allergies   Fosamax [alendronate], Hydrocodone-acetaminophen, and  Lisinopril-hydrochlorothiazide   Review of Systems Review of Systems All systems reviewed and negative, other than as noted in HPI.   Physical Exam Updated Vital Signs BP 132/73    Pulse 78    Temp 98 F (36.7 C) (Oral)    Resp (!) 26    Ht 5\' 1"  (1.549 m)    Wt 46 kg    SpO2 93%    BMI 19.16 kg/m   Physical Exam Vitals signs and nursing note reviewed.  Constitutional:      Appearance: She is well-developed.     Comments: Chronically ill appearing  HENT:     Head: Normocephalic.     Comments: Faint ecchymosis L forehead. No midline spinal tenderness.  Eyes:     General:        Right eye: No discharge.        Left eye: No discharge.     Extraocular Movements: Extraocular movements intact.     Conjunctiva/sclera: Conjunctivae normal.  Neck:     Musculoskeletal: Neck supple.  Cardiovascular:     Rate and Rhythm: Normal rate and regular rhythm.     Heart sounds: Normal heart sounds. No murmur. No friction rub. No gallop.   Pulmonary:     Effort: Pulmonary effort is normal. No respiratory distress.     Breath sounds: Normal breath sounds.  Chest:     Chest wall: Tenderness present.  Abdominal:     General: There is no distension.     Palpations: Abdomen is soft.     Tenderness: There is no abdominal tenderness.  Musculoskeletal:        General: Tenderness present.     Comments: L knee effusion. Patella TTP. Able to actively range although with a lot of pain. Mild discomfort with ROM of L hip.   Skin:    General: Skin is warm and dry.  Neurological:     Mental Status: She is alert.  Psychiatric:        Behavior: Behavior normal.        Thought Content: Thought content normal.      ED Treatments / Results  Labs (all labs ordered are listed, but only abnormal results are displayed) Labs Reviewed  CBC WITH DIFFERENTIAL/PLATELET - Abnormal; Notable for the following components:      Result Value   RBC 3.46 (*)    Hemoglobin 11.2 (*)    HCT 35.1 (*)    MCV 101.4  (*)    Platelets 149 (*)    Lymphs Abs 0.6 (*)    Abs Immature Granulocytes 0.08 (*)    All other components within normal limits  COMPREHENSIVE METABOLIC  PANEL - Abnormal; Notable for the following components:   Sodium 129 (*)    Chloride 97 (*)    Glucose, Bld 150 (*)    Creatinine, Ser 0.42 (*)    Calcium 8.2 (*)    Albumin 3.2 (*)    All other components within normal limits  URINALYSIS, ROUTINE W REFLEX MICROSCOPIC - Abnormal; Notable for the following components:   Hgb urine dipstick MODERATE (*)    Leukocytes,Ua LARGE (*)    Bacteria, UA RARE (*)    All other components within normal limits  MAGNESIUM    EKG EKG Interpretation  Date/Time:  Monday February 14 2019 15:48:03 EDT Ventricular Rate:  88 PR Interval:    QRS Duration: 99 QT Interval:  375 QTC Calculation: 454 R Axis:   52 Text Interpretation:  Sinus rhythm Anterior infarct, old No significant change since last tracing Confirmed by Virgel Manifold [phone]) on 02/14/2019 4:55:30 PM   Radiology Dg Ribs Unilateral W/chest Left  Result Date: 02/14/2019 CLINICAL DATA:  Pt here for on-going generalized weakness and multiple falls. Pt fell last night and this morning. Pt painLT hip, LT ribcage, & l knee EXAM: LEFT RIBS AND CHEST - 3+ VIEW COMPARISON:  02/10/2019 FINDINGS: Focal opacity in the left upper hemithorax is consistent with left upper lobe collapse as noted on the CT dated 01/17/2019. This is similar to the prior radiograph. There are irregularly thickened interstitial markings most evident in the left mid to upper lung. These opacities appear mildly increased in the left mid to upper lung from the prior study, stable elsewhere. No convincing pleural effusion.  No pneumothorax. Cardiac silhouette is normal size. Skeletal structures are demineralized. No convincing fracture or rib lesion. IMPRESSION: 1. No convincing acute rib fracture.  No rib lesion. 2. Left upper lobe collapse similar to the prior radiographs and  CT. There is increased interstitial thickening in the left mid to upper lung. This may be due to asymmetric interstitial edema or lymphatic obstruction versus interstitial infection/inflammation. Other prominent interstitial markings seen diffusely and bilaterally are stable from exam. Electronically Signed   By: Lajean Manes M.D.   On: 02/14/2019 17:48   Ct Head Wo Contrast  Result Date: 02/14/2019 CLINICAL DATA:  Headache posttraumatic EXAM: CT HEAD WITHOUT CONTRAST TECHNIQUE: Contiguous axial images were obtained from the base of the skull through the vertex without intravenous contrast. COMPARISON:  February 10, 2019. FINDINGS: Brain: No evidence of acute territorial infarction, hemorrhage, hydrocephalus,extra-axial collection or mass lesion/mass effect. There is dilatation the ventricles and sulci consistent with age-related atrophy. Low-attenuation changes in the deep white matter consistent with small vessel ischemia. Vascular: No hyperdense vessel. Calcifications are seen within the internal carotid artery and vertebral artery. Skull: The skull is intact. No fracture or focal lesion identified. Sinuses/Orbits: The visualized paranasal sinuses and mastoid air cells are clear. The orbits and globes intact. Other: None IMPRESSION: 1. No acute intracranial abnormality 2. Findings consistent with age related atrophy and chronic small vessel ischemia Electronically Signed   By: Prudencio Pair M.D.   On: 02/14/2019 17:37   Ct Knee Left Wo Contrast  Result Date: 02/14/2019 CLINICAL DATA:  Fracture fall large joint effusion EXAM: CT OF THE left KNEE WITHOUT CONTRAST TECHNIQUE: Multidetector CT imaging of the left knee was performed according to the standard protocol. Multiplanar CT image reconstructions were also generated. COMPARISON:  February 14, 2019 FINDINGS: Bones/Joint/Cartilage There is comminuted slightly impacted fracture seen involving the entirety of the patella. There is intra-articular extension  seen  throughout the patellar surfaces. No definite displaced patellar fragments are identified. There is slight patellar [place]. No other definite fracture is seen. There is diffuse osteopenia. Tricompartmental osteoarthritis is seen most notable in the medial compartment with joint space loss and subchondral sclerosis. A tiny round loose body is seen within the posterior medial compartment. Ligaments Suboptimally assessed by CT. Muscles and Tendons Mild fatty atrophy noted within the musculature. The patellar and quadriceps tendon appear to be intact. Soft tissues There is a moderate lipohemarthrosis. Prepatellar subcutaneous edema is noted. No subcutaneous emphysema. Scattered vascular calcifications are noted. IMPRESSION: 1. Extensive comminuted nondisplaced fractures of throughout the entirety of the patella. 2. Moderate lipohemarthrosis 3. patellar [place] Electronically Signed   By: Prudencio Pair M.D.   On: 02/14/2019 19:05   Dg Knee Complete 4 Views Left  Result Date: 02/14/2019 CLINICAL DATA:  Pt here for on-going generalized weakness and multiple falls. Pt fell last night and this morning. Pt painLT hip, LT ribcage, AND l knee EXAM: LEFT KNEE - COMPLETE 4+ VIEW COMPARISON:  None. FINDINGS: No fracture.  No bone lesion. Moderate to large joint effusion with evidence of a fat fluid level. This distends the suprapatellar joint capsule. There is medial joint space compartment narrowing with small marginal osteophytes from all 3 compartments consistent with osteoarthritis. Skeletal structures are demineralized. IMPRESSION: 1. Moderate to large joint effusion with evidence of a fat fluid level. This suggests a fracture, although a fracture is not visualized. Patient may have occult fracture. This could be further assessed, if desired clinically, with the CT or MRI, MRI more sensitive. 2. Mild-to-moderate osteoarthritis. Electronically Signed   By: Lajean Manes M.D.   On: 02/14/2019 17:52   Dg Hip Unilat W Or Wo  Pelvis 2-3 Views Left  Result Date: 02/14/2019 CLINICAL DATA:  Pt here for on-going generalized weakness and multiple falls. Pt fell last night and this morning. Pt painLT hip, LT ribcage, AND l knee EXAM: DG HIP (WITH OR WITHOUT PELVIS) 2-3V LEFT COMPARISON:  None. FINDINGS: No fracture or bone lesion. Hip joint, SI joints and symphysis pubis are normally spaced and aligned. Right hip arthroplasty appears well seated and aligned. Skeletal structures are demineralized. Soft tissues are unremarkable. IMPRESSION: No fracture, dislocation or acute finding. Electronically Signed   By: Lajean Manes M.D.   On: 02/14/2019 17:53    Procedures Procedures (including critical care time)  Medications Ordered in ED Medications  fentaNYL (SUBLIMAZE) injection 50 mcg (0 mcg Intravenous Hold 02/14/19 1735)  ondansetron (ZOFRAN) injection 4 mg (0 mg Intravenous Hold 02/14/19 1748)     Initial Impression / Assessment and Plan / ED Course  I have reviewed the triage vital signs and the nursing notes.  Pertinent labs & imaging results that were available during my care of the patient were reviewed by me and considered in my medical decision making (see chart for details).        78yF with pain after fall. Hx of lung CA with mets to brain. Just discharged two days ago after presenting with confusion and lethargy. Fell twice today.  Couldn't participate with home PT. Now has patella fracture. She needs placement. She understands this. She doesn't like the idea but understands that she and her family aren't able to care for her safely at home in her current condition. Knee immobilizer for her knee. PRN pain meds. Ortho FU but will likely be non-op. No medical reason for re-admission. Will hold in ED overnight fort social work.  Final Clinical Impressions(s) / ED Diagnoses   Final diagnoses:  Generalized weakness  Fall, initial encounter  Closed nondisplaced fracture of left patella, unspecified fracture  morphology, initial encounter    ED Discharge Orders    None       Virgel Manifold, MD 02/14/19 2342

## 2019-02-14 NOTE — ED Notes (Signed)
Attempted to reach Jacqueline Kidd for Social Work consult.  Left message for her to call ER.

## 2019-02-14 NOTE — Discharge Instructions (Addendum)
Please go to your nursing facility.  Return to ER for difficulty breathing, chest pain, abdominal pain, vomiting, fever.

## 2019-02-14 NOTE — ED Notes (Signed)
Pt given water and crackers  

## 2019-02-14 NOTE — ED Notes (Signed)
Pt now states " im not in a lot of pain at this moment, can I have pain meds in a bit" MD notified.  meds on hold

## 2019-02-15 NOTE — ED Notes (Signed)
Pt was given meal tray, but has not wanted to eat. Stated she would eat when she got ready

## 2019-02-15 NOTE — ED Notes (Signed)
Have updated pts son on information

## 2019-02-15 NOTE — ED Notes (Signed)
Per [facility] policy, will wait until pt has a SNF placement before COVID test.

## 2019-02-15 NOTE — ED Notes (Signed)
Pt did not each much of her food. States she is just not hungry. Will work on getting her and Ensure

## 2019-02-15 NOTE — Plan of Care (Signed)
  Problem: Acute Rehab PT Goals(only PT should resolve) Goal: Pt Will Go Supine/Side To Sit Outcome: Progressing Flowsheets (Taken 02/15/2019 1512) Pt will go Supine/Side to Sit:  with minimal assist  with moderate assist Goal: Patient Will Transfer Sit To/From Stand Outcome: Progressing Flowsheets (Taken 02/15/2019 1512) Patient will transfer sit to/from stand:  with minimal assist  with moderate assist Goal: Pt Will Transfer Bed To Chair/Chair To Bed Outcome: Progressing Flowsheets (Taken 02/15/2019 1512) Pt will Transfer Bed to Chair/Chair to Bed:  with min assist  with mod assist Goal: Pt Will Ambulate Outcome: Progressing Flowsheets (Taken 02/15/2019 1512) Pt will Ambulate:  25 feet  with minimal assist  with moderate assist  with rolling walker   3:13 PM, 02/15/19 Lonell Grandchild, MPT Physical Therapist with [facility] [phone] office [phone] mobile phone

## 2019-02-15 NOTE — [place] FL2 (Signed)
[place] MEDICAID FL2 LEVEL OF CARE SCREENING TOOL     IDENTIFICATION  Patient Name: Jacqueline Kidd Birthdate: Oct 17, 1940 Sex: female Admission Date (Current Location): 02/10/2019  [facility] and [place] Number:  [employer] and Address:  [place] [address], [place]      Provider Number: [id number]  Attending Physician Name and Address:  No att. providers found  Relative Name and Phone Number:       Current Level of Care: Other (Comment)(Patient is in ED) Recommended Level of Care: [place] Prior Approval Number:    Date Approved/Denied:   PASRR Number: [id number]  Discharge Plan: SNF    Current Diagnoses: Patient Active Problem List   Diagnosis Date Noted  . Acute metabolic encephalopathy 96/29/5284  . Encounter for antineoplastic immunotherapy 03/25/2018  . Chemotherapy-induced neuropathy ([place]) 02/18/2018  . Peripheral neuropathy 11/18/2017  . Need for influenza vaccination 04/15/2017  . Brain metastases ([place]) 03/30/2017  . Goals of care, counseling/discussion 03/23/2017  . Encounter for antineoplastic chemotherapy 03/23/2017  . Mass of parotid gland 09-01-2016  . Primary cancer of left upper lobe of lung ([place]) 02/19/2017  . Lung mass 02/16/2017  . BMI 33.0-33.9,adult 03/21/2016  . Peripheral edema 03/21/2016  . Osteoporosis 06/21/2014  . Hypertension 01/21/2013    Orientation RESPIRATION BLADDER Height & Weight     Self, Time, Situation, Place  Normal Incontinent Weight: 101 lb 6.6 oz (46 kg) Height:  5\' 1"  (154.9 cm)  BEHAVIORAL SYMPTOMS/MOOD NEUROLOGICAL BOWEL NUTRITION STATUS      Continent Diet  AMBULATORY STATUS COMMUNICATION OF NEEDS Skin   Extensive Assist Verbally Normal                       Personal Care Assistance Level of Assistance  Bathing, Feeding, Dressing Bathing Assistance: Limited assistance Feeding assistance: Independent Dressing Assistance: Limited assistance      Functional Limitations Info  Sight, Hearing, Speech Sight Info: Adequate Hearing Info: Adequate Speech Info: Adequate    SPECIAL CARE FACTORS FREQUENCY  PT (By licensed PT)     PT Frequency: 5x/week              Contractures Contractures Info: Not present    Additional Factors Info  Code Status, Allergies Code Status Info: Full Code Allergies Info: fosamax, hydrocodone-acetaminophen, lisinopril-hydrochlorothiazide           Current Medications (02/15/2019):  This is the current hospital active medication list No current facility-administered medications for this encounter.    Current Outpatient Medications  Medication Sig Dispense Refill  . acetaminophen (TYLENOL) 325 MG tablet Take 325 mg by mouth every 6 (six) hours as needed for mild pain or moderate pain.     . Albuterol Sulfate 108 (90 Base) MCG/ACT AEPB Inhale 2 puffs into the lungs every 6 (six) hours as needed. 1 each 0  . cholecalciferol (VITAMIN D3) 25 MCG (1000 UT) tablet Take 1,000 Units by mouth daily.    Marland Kitchen LORazepam (ATIVAN) 0.5 MG tablet Take 1 tablet (0.5 mg total) by mouth as needed for anxiety (take one tablet 30 minutes prior to MRI scans and radiation treatment). 10 tablet 0  . NIVOLUMAB IV Inject 1 Dose into the vein every 28 (twenty-eight) days.    . Omega-3 Fatty Acids (FISH OIL OMEGA-3 PO) Take 1 capsule by mouth daily.     . prochlorperazine (COMPAZINE) 10 MG tablet Take 1 tablet (10 mg total) by mouth every 6 (six) hours  as needed for nausea or vomiting. 30 tablet 0  . dexamethasone (DECADRON) 4 MG tablet Take 1 tablet (4 mg total) by mouth daily. 7 tablet 0  . DULoxetine (CYMBALTA) 30 MG capsule Take 1 capsule (30 mg total) by mouth daily. 90 capsule 0  . feeding supplement, ENSURE ENLIVE, (ENSURE ENLIVE) LIQD Take 237 mLs by mouth 2 (two) times daily between meals. 14220 mL 0  . gabapentin (NEURONTIN) 300 MG capsule Take 1 capsule (300 mg total) by mouth at bedtime. 90 capsule 0    Facility-Administered Medications Ordered in Other Encounters  Medication Dose Route Frequency Provider Last Rate Last Dose  . acetaminophen (TYLENOL) tablet 325 mg  325 mg Oral Q6H PRN Virgel Manifold, MD      . albuterol (PROVENTIL) (2.5 MG/3ML) 0.083% nebulizer solution 3 mL  3 mL Inhalation Q6H PRN Virgel Manifold, MD      . dexamethasone (DECADRON) tablet 4 mg  4 mg Oral Daily Virgel Manifold, MD   4 mg at 02/15/19 1034  . DULoxetine (CYMBALTA) DR capsule 30 mg  30 mg Oral Daily Virgel Manifold, MD      . feeding supplement (ENSURE ENLIVE) (ENSURE ENLIVE) liquid 237 mL  237 mL Oral BID BM Virgel Manifold, MD   237 mL at 02/15/19 1415  . fentaNYL (SUBLIMAZE) injection 50 mcg  50 mcg Intravenous Once Virgel Manifold, MD   Stopped at 02/14/19 1735  . gabapentin (NEURONTIN) capsule 300 mg  300 mg Oral QHS Virgel Manifold, MD   300 mg at 02/15/19 0046  . LORazepam (ATIVAN) tablet 0.5 mg  0.5 mg Oral PRN Virgel Manifold, MD      . omega-3 acid ethyl esters (LOVAZA) capsule   Oral Daily Virgel Manifold, MD   1 g at 02/15/19 1034  . ondansetron (ZOFRAN) injection 4 mg  4 mg Intravenous Once Virgel Manifold, MD   Stopped at 02/14/19 1748  . prochlorperazine (COMPAZINE) tablet 10 mg  10 mg Oral Q6H PRN Virgel Manifold, MD         Discharge Medications: Please see discharge summary for a list of discharge medications.  Relevant Imaging Results:  Relevant Lab Results:   Additional Information SSN [id number] Clydene Pugh, LCSW

## 2019-02-15 NOTE — ED Notes (Signed)
Pts O2 sats decreased to 90%. Have applied 2L [place]

## 2019-02-15 NOTE — ED Notes (Signed)
Have applied 2L[place].  

## 2019-02-15 NOTE — ED Notes (Signed)
Have changed sheets and purewick.

## 2019-02-15 NOTE — ED Notes (Signed)
Contacted Health visitor and she stated that she was working on placement and would update [place] with more information.

## 2019-02-15 NOTE — ED Notes (Signed)
Dr. Wilson Singer aware os low O2 sats. Stated to keep pt on 2L [name] and keep an eye on her.

## 2019-02-15 NOTE — Evaluation (Signed)
Physical Therapy Evaluation Patient Details Name: Jacqueline Kidd MRN: [id number] DOB: 1940/11/02 Today's Date: 02/15/2019   History of Present Illness  78yF with pain after fall. Hx of lung CA with mets to brain. Just discharged two days ago after presenting with confusion and lethargy. Fell twice today.  Couldn't participate with home PT. Now has patella fracture. She needs placement. She understands this. She doesn't like the idea but understands that she and her family aren't able to care for her safely at home in her current condition. Knee immobilizer for her knee. PRN pain meds. Ortho FU but will likely be non-op. No medical reason for re-admission. Will hold in ED overnight fort social work.     Clinical Impression  Patient presents with immobilizer left knee, requires much time and assistance to sit up at bedside with c/o severe pain left knee during movement, limited to a few unsteady short labored side steps at bedside with fair/poor tolerance for weightbearing on LLE.  Patient put back to bed after therapy.  Patient will benefit from continued physical therapy in hospital and recommended venue below to increase strength, balance, endurance for safe ADLs and gait.    Follow Up Recommendations SNF    Equipment Recommendations  None recommended by PT    Recommendations for Other Services       Precautions / Restrictions Precautions Precautions: Fall Required Braces or Orthoses: Knee Immobilizer - Left Restrictions Weight Bearing Restrictions: Yes LLE Weight Bearing: Weight bearing as tolerated      Mobility  Bed Mobility Overal bed mobility: Needs Assistance Bed Mobility: Supine to Sit;Sit to Supine     Supine to sit: Mod assist;Max assist Sit to supine: Mod assist;Max assist   General bed mobility comments: requires much time, labored movement  Transfers Overall transfer level: Needs assistance Equipment used: Rolling walker (2 wheeled) Transfers: Sit to/from  Stand Sit to Stand: Mod assist         General transfer comment: slow labored movement  Ambulation/Gait Ambulation/Gait assistance: Mod assist;Max assist Gait Distance (Feet): 4 Feet Assistive device: Rolling walker (2 wheeled) Gait Pattern/deviations: Decreased step length - right;Decreased step length - left;Decreased stance time - left;Decreased stride length Gait velocity: slow   General Gait Details: limited to 4-5 slow unsteady side steps at bedside due to increased LLE pain when weightbearing  Stairs            Wheelchair Mobility    Modified Rankin (Stroke Patients Only)       Balance Overall balance assessment: Needs assistance Sitting-balance support: Feet supported;Bilateral upper extremity supported Sitting balance-Leahy Scale: Fair Sitting balance - Comments: seated at side of gurney   Standing balance support: During functional activity;Bilateral upper extremity supported Standing balance-Leahy Scale: Poor Standing balance comment: fair/poor using RW                             Pertinent Vitals/Pain Pain Assessment: Faces Faces Pain Scale: Hurts even more Pain Location: left knee when weightbearing Pain Descriptors / Indicators: Grimacing;Guarding;Moaning Pain Intervention(s): Limited activity within patient's tolerance;Monitored during session;Repositioned    Home Living Family/patient expects to be discharged to:: Private residence Living Arrangements: Spouse/significant other Available Help at Discharge: Family;Available PRN/intermittently Type of Home: House Home Access: Stairs to enter   Entrance Stairs-Number of Steps: 4 Home Layout: One level Home Equipment: Walker - 2 wheels;Shower seat;Bedside commode;Cane - single point      Prior Function Level of Independence: Needs  assistance   Gait / Transfers Assistance Needed: household ambulator with RW  ADL's / Homemaking Assistance Needed: assisted by family         Hand Dominance   Dominant Hand: Right    Extremity/Trunk Assessment   Upper Extremity Assessment Upper Extremity Assessment: Generalized weakness    Lower Extremity Assessment Lower Extremity Assessment: Generalized weakness;LLE deficits/detail LLE Deficits / Details: grossly -3/5 LLE: Unable to fully assess due to pain LLE Sensation: WNL LLE Coordination: WNL    Cervical / Trunk Assessment Cervical / Trunk Assessment: Normal  Communication   Communication: No difficulties  Cognition Arousal/Alertness: Awake/alert Behavior During Therapy: WFL for tasks assessed/performed Overall Cognitive Status: Within Functional Limits for tasks assessed                                        General Comments      Exercises     Assessment/Plan    PT Assessment Patient needs continued PT services  PT Problem List Decreased strength;Decreased activity tolerance;Decreased balance;Decreased mobility       PT Treatment Interventions DME instruction;Gait training;Stair training;Functional mobility training;Therapeutic activities;Therapeutic exercise;Patient/family education    PT Goals (Current goals can be found in the Care Plan section)  Acute Rehab PT Goals Patient Stated Goal: return home PT Goal Formulation: With patient Time For Goal Achievement: 03/01/19 Potential to Achieve Goals: Good    Frequency Min 3X/week   Barriers to discharge        Co-evaluation               AM-PAC PT "6 Clicks" Mobility  Outcome Measure Help needed turning from your back to your side while in a flat bed without using bedrails?: A Lot Help needed moving from lying on your back to sitting on the side of a flat bed without using bedrails?: A Lot Help needed moving to and from a bed to a chair (including a wheelchair)?: A Lot Help needed standing up from a chair using your arms (e.g., wheelchair or bedside chair)?: A Lot Help needed to walk in hospital room?: A  Lot Help needed climbing 3-5 steps with a railing? : Total 6 Click Score: 11    End of Session Equipment Utilized During Treatment: Left knee immobilizer Activity Tolerance: Patient tolerated treatment well;Patient limited by fatigue;Patient limited by pain Patient left: in bed;with call bell/phone within reach Nurse Communication: Mobility status PT Visit Diagnosis: Unsteadiness on feet (R26.81);Other abnormalities of gait and mobility (R26.89);Repeated falls (R29.6);Muscle weakness (generalized) (M62.81)    Time: 1410-1440 PT Time Calculation (min) (ACUTE ONLY): 30 min   Charges:   PT Evaluation $PT Eval Moderate Complexity: 1 Mod PT Treatments $Therapeutic Activity: 23-37 mins        3:11 PM, 02/15/19 Lonell Grandchild, MPT Physical Therapist with [facility] [phone] office [phone] mobile phone

## 2019-02-15 NOTE — Clinical Social Work Note (Addendum)
[facility] initiated services with patient on yesterday and advised that patient need to come to ED. PT assessed and recommended SNF. Patient's husband was provided SNF choices and referrals sent to choices.  Both facilities advised that they will notify in the morning of potential bed offer. Charge nurse made aware.    Thyra Yinger, Clydene Pugh, LCSW

## 2019-02-16 ENCOUNTER — Other Ambulatory Visit: Payer: Self-pay

## 2019-02-16 LAB — SARS CORONAVIRUS 2 BY RT PCR (HOSPITAL ORDER, PERFORMED IN ~~LOC~~ HOSPITAL LAB): SARS Coronavirus 2: NEGATIVE

## 2019-02-16 MED ORDER — SULFAMETHOXAZOLE-TRIMETHOPRIM 800-160 MG PO TABS
1.0000 | ORAL_TABLET | Freq: Two times a day (BID) | ORAL | Status: DC
  Administered 2019-02-16 – 2019-02-17 (×3): 1 via ORAL
  Filled 2019-02-16 (×3): qty 1

## 2019-02-16 NOTE — ED Notes (Signed)
Have given pt a meal tray  

## 2019-02-16 NOTE — ED Notes (Signed)
Pt is sleeping with equal rise and chest fall

## 2019-02-16 NOTE — Patient Outreach (Signed)
[facility]) Care Management  02/16/2019  CRISTIANNA CYR 12/01/1940 [id number]   EMMiIGeneral Discharge received. Patient currently in ED waiting placement.    Plan: RN CM will have CMA stop EMMI calls and close case.  Jone Baseman, RN, MSN [place] Management Care Management Coordinator Direct Line [phone] Cell [phone] Toll Free: [phone]  Fax: [phone]

## 2019-02-16 NOTE — Clinical Social Work Note (Signed)
[facility] called with bed offer.  Confirmed with husband that he wanted [place] proceed.  Confirmed.  Kerri at [facility] is working on Ship broker

## 2019-02-16 NOTE — ED Notes (Signed)
Pt ate small amount of vegetables and a dinner roll from dinner tray

## 2019-02-17 ENCOUNTER — Inpatient Hospital Stay
Admission: RE | Admit: 2019-02-17 | Discharge: 2019-02-23 | Disposition: A | Discharge status: Home / Self Care | Payer: Medicare Other | Source: Ambulatory Visit | Attending: Internal Medicine | Admitting: Internal Medicine

## 2019-02-17 ENCOUNTER — Telehealth: Payer: Self-pay | Admitting: *Deleted

## 2019-02-17 DIAGNOSIS — Z79899 Other long term (current) drug therapy: Secondary | ICD-10-CM | POA: Diagnosis not present

## 2019-02-17 DIAGNOSIS — M810 Age-related osteoporosis without current pathological fracture: Secondary | ICD-10-CM | POA: Diagnosis not present

## 2019-02-17 DIAGNOSIS — R51 Headache: Secondary | ICD-10-CM | POA: Diagnosis not present

## 2019-02-17 DIAGNOSIS — I10 Essential (primary) hypertension: Secondary | ICD-10-CM | POA: Diagnosis not present

## 2019-02-17 DIAGNOSIS — Z741 Need for assistance with personal care: Secondary | ICD-10-CM | POA: Diagnosis not present

## 2019-02-17 DIAGNOSIS — M545 Low back pain: Secondary | ICD-10-CM | POA: Diagnosis not present

## 2019-02-17 DIAGNOSIS — S82045D Nondisplaced comminuted fracture of left patella, subsequent encounter for closed fracture with routine healing: Secondary | ICD-10-CM | POA: Diagnosis not present

## 2019-02-17 DIAGNOSIS — N390 Urinary tract infection, site not specified: Secondary | ICD-10-CM | POA: Diagnosis not present

## 2019-02-17 DIAGNOSIS — J449 Chronic obstructive pulmonary disease, unspecified: Secondary | ICD-10-CM | POA: Diagnosis not present

## 2019-02-17 DIAGNOSIS — G9009 Other idiopathic peripheral autonomic neuropathy: Secondary | ICD-10-CM | POA: Diagnosis not present

## 2019-02-17 DIAGNOSIS — Z7401 Bed confinement status: Secondary | ICD-10-CM | POA: Diagnosis not present

## 2019-02-17 DIAGNOSIS — S82015D Nondisplaced osteochondral fracture of left patella, subsequent encounter for closed fracture with routine healing: Secondary | ICD-10-CM | POA: Diagnosis not present

## 2019-02-17 DIAGNOSIS — C3412 Malignant neoplasm of upper lobe, left bronchus or lung: Secondary | ICD-10-CM | POA: Diagnosis not present

## 2019-02-17 DIAGNOSIS — M6281 Muscle weakness (generalized): Secondary | ICD-10-CM | POA: Diagnosis not present

## 2019-02-17 DIAGNOSIS — C719 Malignant neoplasm of brain, unspecified: Secondary | ICD-10-CM | POA: Diagnosis not present

## 2019-02-17 DIAGNOSIS — R296 Repeated falls: Secondary | ICD-10-CM | POA: Diagnosis not present

## 2019-02-17 DIAGNOSIS — Z85118 Personal history of other malignant neoplasm of bronchus and lung: Secondary | ICD-10-CM | POA: Diagnosis not present

## 2019-02-17 DIAGNOSIS — G620 Drug-induced polyneuropathy: Secondary | ICD-10-CM | POA: Diagnosis not present

## 2019-02-17 DIAGNOSIS — K219 Gastro-esophageal reflux disease without esophagitis: Secondary | ICD-10-CM | POA: Diagnosis not present

## 2019-02-17 DIAGNOSIS — E440 Moderate protein-calorie malnutrition: Secondary | ICD-10-CM | POA: Diagnosis not present

## 2019-02-17 DIAGNOSIS — R262 Difficulty in walking, not elsewhere classified: Secondary | ICD-10-CM | POA: Diagnosis not present

## 2019-02-17 DIAGNOSIS — E871 Hypo-osmolality and hyponatremia: Secondary | ICD-10-CM | POA: Diagnosis not present

## 2019-02-17 DIAGNOSIS — Z5112 Encounter for antineoplastic immunotherapy: Secondary | ICD-10-CM | POA: Diagnosis not present

## 2019-02-17 DIAGNOSIS — Z87891 Personal history of nicotine dependence: Secondary | ICD-10-CM | POA: Diagnosis not present

## 2019-02-17 DIAGNOSIS — S82002A Unspecified fracture of left patella, initial encounter for closed fracture: Secondary | ICD-10-CM | POA: Diagnosis not present

## 2019-02-17 DIAGNOSIS — E785 Hyperlipidemia, unspecified: Secondary | ICD-10-CM | POA: Diagnosis not present

## 2019-02-17 DIAGNOSIS — R29898 Other symptoms and signs involving the musculoskeletal system: Secondary | ICD-10-CM | POA: Diagnosis not present

## 2019-02-17 DIAGNOSIS — C7931 Secondary malignant neoplasm of brain: Secondary | ICD-10-CM | POA: Diagnosis not present

## 2019-02-17 DIAGNOSIS — M25552 Pain in left hip: Secondary | ICD-10-CM | POA: Diagnosis not present

## 2019-02-17 LAB — URINE CULTURE: Culture: >=100000 — AB

## 2019-02-17 MED ORDER — DEXAMETHASONE 4 MG PO TABS: 4.0000 mg | ORAL_TABLET | Freq: Every day | ORAL | 0 refills | Status: AC

## 2019-02-17 NOTE — Clinical Social Work Note (Signed)
Received call from [place] at [facility] that patient's insurance has approved SNF stay.  Nursing to call report, after which [facility] staff will come to transport patient.  Husband alerted.

## 2019-02-17 NOTE — ED Notes (Signed)
Patient's gown changed. Brief and pure wick changed with canister emptied. Patient repositioned in bed and given warm blankets with call bell in reach.

## 2019-02-17 NOTE — Telephone Encounter (Signed)
"  [place], [place] [phone]).  We are receiving [name] today.  We have Dr. Julien Nordmann as her PCP.  Has she received a TDAP vaccine?  We called Dr. Laurance Flatten about a 2015 immunization note."  No TDAP noted in EPIC immunization list.  No TDAP or other immunizations noted in Care Everywhere files.  Currently no further request or needs.

## 2019-02-17 NOTE — Discharge Summary (Signed)
Report given to [place] at the [facility].

## 2019-02-18 ENCOUNTER — Encounter: Payer: Self-pay | Admitting: Adult Health

## 2019-02-18 ENCOUNTER — Other Ambulatory Visit: Payer: Self-pay | Admitting: Adult Health

## 2019-02-18 ENCOUNTER — Telehealth: Payer: Self-pay

## 2019-02-18 ENCOUNTER — Telehealth: Payer: Self-pay | Admitting: *Deleted

## 2019-02-18 ENCOUNTER — Non-Acute Institutional Stay (SKILLED_NURSING_FACILITY): Payer: Medicare Other | Admitting: Adult Health

## 2019-02-18 DIAGNOSIS — C3412 Malignant neoplasm of upper lobe, left bronchus or lung: Secondary | ICD-10-CM

## 2019-02-18 DIAGNOSIS — G620 Drug-induced polyneuropathy: Secondary | ICD-10-CM

## 2019-02-18 DIAGNOSIS — C7931 Secondary malignant neoplasm of brain: Secondary | ICD-10-CM

## 2019-02-18 DIAGNOSIS — B9620 Unspecified Escherichia coli [E. coli] as the cause of diseases classified elsewhere: Secondary | ICD-10-CM

## 2019-02-18 DIAGNOSIS — N390 Urinary tract infection, site not specified: Secondary | ICD-10-CM | POA: Diagnosis not present

## 2019-02-18 DIAGNOSIS — T451X5A Adverse effect of antineoplastic and immunosuppressive drugs, initial encounter: Secondary | ICD-10-CM

## 2019-02-18 DIAGNOSIS — S82045D Nondisplaced comminuted fracture of left patella, subsequent encounter for closed fracture with routine healing: Secondary | ICD-10-CM

## 2019-02-18 DIAGNOSIS — S82045A Nondisplaced comminuted fracture of left patella, initial encounter for closed fracture: Secondary | ICD-10-CM | POA: Insufficient documentation

## 2019-02-18 DIAGNOSIS — E440 Moderate protein-calorie malnutrition: Secondary | ICD-10-CM | POA: Diagnosis not present

## 2019-02-18 MED ORDER — LORAZEPAM 0.5 MG PO TABS: 0.5000 mg | ORAL_TABLET | ORAL | 0 refills | Status: AC | PRN

## 2019-02-18 NOTE — Telephone Encounter (Signed)
Received call from pt's husband, Fritz Pickerel. He states she had a fall and is in rehab now. He needs to cancel her appts for next and stated he would call us to let us know when gets out of rehab.

## 2019-02-18 NOTE — Telephone Encounter (Signed)
No treatment needed for [facility] ED 02/17/2019 per Sophina Caccavale PA

## 2019-02-18 NOTE — Progress Notes (Signed)
Location:    [facility] Room Number: 154/W Place of Service:  SNF (31)   CODE STATUS: Full Code  Allergies  Allergen Reactions  . Fosamax [Alendronate]     Pain   . Hydrocodone-Acetaminophen Nausea And Vomiting    EXTREME VOMITING   . Lisinopril-Hydrochlorothiazide Other (See Comments)    Chest spasms    Chief Complaint  Patient presents with  . Follow-up    Hospital Follow Up    HPI:  She is a 78 year old who has a history of lung cancer with mets to the brain. She had been hospitalized at the end of last month for weakness and diarrhea. On 02-24-19 she had a fall and sustained a left petalla fracture. She went to the ED for further work up and treatment. At this time the preferred coarse of action is to avoid surgical intervention. She is here for short term rehab with her goal to return back home. She does live with her spouse who is her primary care giver. She does have steps to get into her house. She denies any uncontrolled pain. She has a poor appetite. No complaints of anxiety or depressive thoughts. She will continue to be followed for her chronic illnesses including: lung cancer; brain mets; neuropathy; malnutrition   Past Medical History:  Diagnosis Date  . Chronic low back pain   . COPD (chronic obstructive pulmonary disease) ([place])   . GERD (gastroesophageal reflux disease)   . Hyperlipidemia   . Hypertension   . Lesion of left lung   . lung ca dx'd 01/2017  . Osteoporosis     Past Surgical History:  Procedure Laterality Date  . ABDOMINAL HYSTERECTOMY    . TOTAL HIP ARTHROPLASTY     right    Social History   Socioeconomic History  . Marital status: Married    Spouse name: Not on file  . Number of children: Not on file  . Years of education: Not on file  . Highest education level: Not on file  Occupational History  . Not on file  Social Needs  . Financial resource strain: Not on file  . Food insecurity    Worry: Not on file     Inability: Not on file  . Transportation needs    Medical: Not on file    Non-medical: Not on file  Tobacco Use  . Smoking status: Former Smoker    Packs/day: 1.00    Years: 57.00    Pack years: 57.00    Quit date: 02/16/2017    Years since quitting: 2.0  . Smokeless tobacco: Never Used  Substance and Sexual Activity  . Alcohol use: No  . Drug use: No  . Sexual activity: Never  Lifestyle  . Physical activity    Days per week: Not on file    Minutes per session: Not on file  . Stress: Not on file  Relationships  . Social Herbalist on phone: Not on file    Gets together: Not on file    Attends religious service: Not on file    Active member of club or organization: Not on file    Attends meetings of clubs or organizations: Not on file    Relationship status: Not on file  . Intimate partner violence    Fear of current or ex partner: Not on file    Emotionally abused: Not on file    Physically abused: Not on file  Forced sexual activity: Not on file  Other Topics Concern  . Not on file  Social History Narrative   03-09-08 Unable to ask husband with her today.   Family History  Problem Relation Age of Onset  . Cancer Mother        breast  . Cancer Father        esophageal  . Cancer Sister        breast      VITAL SIGNS BP 111/64   Pulse 63   Temp 97.7 F (36.5 C) (Oral)   Resp 18   Wt 105 lb (47.6 kg)   BMI 19.84 kg/m   Outpatient Encounter Medications as of 02/18/2019  Medication Sig Note  . acetaminophen (TYLENOL) 325 MG tablet Take 325 mg by mouth every 6 (six) hours as needed for mild pain or moderate pain.    . Albuterol Sulfate 108 (90 Base) MCG/ACT AEPB Inhale 2 puffs into the lungs every 6 (six) hours as needed.   . cholecalciferol (VITAMIN D3) 25 MCG (1000 UT) tablet Take 1,000 Units by mouth daily.   Marland Kitchen dexamethasone (DECADRON) 4 MG tablet Take 1 tablet (4 mg total) by mouth daily for 2 days.   . DULoxetine (CYMBALTA) 30 MG capsule  Take 1 capsule (30 mg total) by mouth daily.   . feeding supplement, ENSURE ENLIVE, (ENSURE ENLIVE) LIQD Take 237 mLs by mouth 2 (two) times daily between meals.   . gabapentin (NEURONTIN) 300 MG capsule Take 1 capsule (300 mg total) by mouth at bedtime.   Marland Kitchen LORazepam (ATIVAN) 0.5 MG tablet Take 1 tablet (0.5 mg total) by mouth as needed for anxiety (take one tablet 30 minutes prior to MRI scans and radiation treatment).   Marland Kitchen NIVOLUMAB IV Inject 1 Dose into the vein every 28 (twenty-eight) days. 02/10/2019: Next infusion due 02/24/2019  . NON FORMULARY Diet: Regular, Consistent Carbohydrate   . Omega-3 Fatty Acids (FISH OIL OMEGA-3 PO) Take 1 capsule by mouth daily.    . prochlorperazine (COMPAZINE) 10 MG tablet Take 1 tablet (10 mg total) by mouth every 6 (six) hours as needed for nausea or vomiting.    No facility-administered encounter medications on file as of 02/18/2019.      SIGNIFICANT DIAGNOSTIC EXAMS  TODAY  02-14-19: left knee x-ray:  1. Moderate to large joint effusion with evidence of a fat fluid level. This suggests a fracture, although a fracture is not visualized. Patient may have occult fracture.  2. Mild-to-moderate osteoarthritis.  02-14-19: ct of left knee:  1. Extensive comminuted nondisplaced fractures of throughout the entirety of the patella. 2. Moderate lipohemarthrosis 3. patellar [place]  02-14-19: left hip x-ray: No fracture, dislocation or acute finding.   02-14-19: left rib with chest x-ray:  1. No convincing acute rib fracture.  No rib lesion. 2. Left upper lobe collapse similar to the prior radiographs and CT. There is increased interstitial thickening in the left mid to upper lung. This may be due to asymmetric interstitial edema or lymphatic obstruction versus interstitial infection/inflammation. Other prominent interstitial markings seen diffusely and bilaterally are stable from exam.   02-14-19: ct of head:  1. No acute intracranial abnormality 2. Findings  consistent with age related atrophy and chronic small vessel ischemia  LABS REVIEWED TODAY:   02-12-19: tsh 0.521; vit B12: 570 02-14-19: wbc 8.6; hgb 11.2; hct 35.1; mcv 101.4; plt 149; glucose 150; bun 11; creat 0.42; k+ 3.9; na++ 129; ca 8.2; liver normal albumin 3.2; mag 1.8: urine  culture: e-coli   Review of Systems  Constitutional: Negative for malaise/fatigue.  Respiratory: Negative for cough and shortness of breath.   Cardiovascular: Negative for chest pain, palpitations and leg swelling.  Gastrointestinal: Negative for abdominal pain, constipation and heartburn.  Musculoskeletal: Negative for back pain, joint pain and myalgias.  Skin: Negative.   Neurological: Negative for dizziness.  Psychiatric/Behavioral: The patient is not nervous/anxious.     Physical Exam Constitutional:      General: She is not in acute distress.    Appearance: She is well-developed. She is not diaphoretic.     Comments: Frail   Neck:     Thyroid: No thyromegaly.  Cardiovascular:     Rate and Rhythm: Normal rate and regular rhythm.     Heart sounds: Normal heart sounds.  Pulmonary:     Effort: Pulmonary effort is normal. No respiratory distress.     Breath sounds: Wheezing present.     Comments: Scattered throughout  Abdominal:     General: Bowel sounds are normal. There is no distension.     Palpations: Abdomen is soft.     Tenderness: There is no abdominal tenderness.  Musculoskeletal:     Right lower leg: No edema.     Left lower leg: No edema.     Comments: Has left lower extremity with splint in place Is able to move all extremities  History of right hip arthroplasty   Lymphadenopathy:     Cervical: No cervical adenopathy.  Skin:    General: Skin is warm and dry.  Neurological:     Mental Status: She is alert and oriented to person, place, and time.  Psychiatric:        Mood and Affect: Mood normal.       ASSESSMENT/ PLAN:  TODAY:   1. E-coli UTI: is without change will  begin cipro 250 mg twice daily through 02-23-19 with probiotic twice daily through 02-28-19.   2. Primary cancer of left upper lobe of lung/brain metastasis: is due for NVOLUMAB IV: injection on 02-24-19. Will continue decadron 4 mg through 02-19-19. Will continue ativan 0.5 mg as needed prior to radiation therapy or mri  Has compazine 10 mg every 6 hours as needed for n/a.   3. Chemotherapy induced neuropathy: is stable will continue neurontin 300 mg nightly and cymbalta 30 mg daily   4. Protein calorie malnutrition moderate: is stable will continue ensure twice daily  5. Closed nondisplaced comminuted fracture of left patella with routine healing subsequent encounter: is without change: will continue therapy as directed will follow up with orthopedics. Has tylenol 325 mg every 6 hours as needed for pain. Will continue to wear brace.   Will check bmp     MD is aware of resident's narcotic use and is in agreement with current plan of care. We will attempt to wean resident as apropriate   Ok Edwards NP [facility]  Contact [phone] Monday through Friday 8am- 5pm  After hours call [phone]

## 2019-02-21 ENCOUNTER — Encounter: Payer: Self-pay | Admitting: Adult Health

## 2019-02-21 ENCOUNTER — Other Ambulatory Visit (HOSPITAL_COMMUNITY)
Admission: RE | Admit: 2019-02-21 | Discharge: 2019-02-21 | Disposition: A | Discharge status: Home / Self Care | Payer: Medicare Other | Source: Skilled Nursing Facility | Attending: Adult Health | Admitting: Adult Health

## 2019-02-21 ENCOUNTER — Non-Acute Institutional Stay (SKILLED_NURSING_FACILITY): Payer: Medicare Other | Admitting: Adult Health

## 2019-02-21 DIAGNOSIS — E871 Hypo-osmolality and hyponatremia: Secondary | ICD-10-CM | POA: Diagnosis not present

## 2019-02-21 DIAGNOSIS — C7931 Secondary malignant neoplasm of brain: Secondary | ICD-10-CM | POA: Diagnosis not present

## 2019-02-21 DIAGNOSIS — C3412 Malignant neoplasm of upper lobe, left bronchus or lung: Secondary | ICD-10-CM | POA: Diagnosis not present

## 2019-02-21 DIAGNOSIS — S82045D Nondisplaced comminuted fracture of left patella, subsequent encounter for closed fracture with routine healing: Secondary | ICD-10-CM | POA: Diagnosis not present

## 2019-02-21 LAB — BASIC METABOLIC PANEL
Anion gap: 9 (ref 5–15)
BUN: 14 mg/dL (ref 8–23)
CO2: 25 mmol/L (ref 22–32)
Calcium: 8.4 mg/dL — ABNORMAL LOW (ref 8.9–10.3)
Chloride: 93 mmol/L — ABNORMAL LOW (ref 98–111)
Creatinine, Ser: 0.4 mg/dL — ABNORMAL LOW (ref 0.44–1.00)
GFR calc Af Amer: >60 mL/min (ref >60)
GFR calc non Af Amer: >60 mL/min (ref >60)
Glucose, Bld: 105 mg/dL — ABNORMAL HIGH (ref 70–99)
Potassium: 3.8 mmol/L (ref 3.5–5.1)
Sodium: 127 mmol/L — ABNORMAL LOW (ref 135–145)

## 2019-02-21 NOTE — Progress Notes (Signed)
Location:   [facility] Room Number: [place] of Service:  SNF (31)    CODE STATUS: Full Code  Allergies  Allergen Reactions  . Fosamax [Alendronate]     Pain   . Hydrocodone-Acetaminophen Nausea And Vomiting    EXTREME VOMITING   . Lisinopril-Hydrochlorothiazide Other (See Comments)    Chest spasms    Chief Complaint  Patient presents with  . Discharge Note    Discharging home with Hospice on 02/23/2019    HPI:  She is being discharged to home with hospice care. She will not need dme; her medications will be provided through hospice care. She will follow up with hospice care. She had been hospitalized for a left patella fracture. She does have lung cancer with brain mets. Her family has decided that her needs will be better met at home with hospice.    Past Medical History:  Diagnosis Date  . Chronic low back pain   . COPD (chronic obstructive pulmonary disease) ([place])   . GERD (gastroesophageal reflux disease)   . Hyperlipidemia   . Hypertension   . Lesion of left lung   . lung ca dx'd 01/2017  . Osteoporosis     Past Surgical History:  Procedure Laterality Date  . ABDOMINAL HYSTERECTOMY    . TOTAL HIP ARTHROPLASTY     right    Social History   Socioeconomic History  . Marital status: Married    Spouse name: Not on file  . Number of children: Not on file  . Years of education: Not on file  . Highest education level: Not on file  Occupational History  . Not on file  Social Needs  . Financial resource strain: Not on file  . Food insecurity    Worry: Not on file    Inability: Not on file  . Transportation needs    Medical: Not on file    Non-medical: Not on file  Tobacco Use  . Smoking status: Former Smoker    Packs/day: 1.00    Years: 57.00    Pack years: 57.00    Quit date: 02/16/2017    Years since quitting: 2.0  . Smokeless tobacco: Never Used  Substance and Sexual Activity  . Alcohol use: No  . Drug use: No   . Sexual activity: Never  Lifestyle  . Physical activity    Days per week: Not on file    Minutes per session: Not on file  . Stress: Not on file  Relationships  . Social Herbalist on phone: Not on file    Gets together: Not on file    Attends religious service: Not on file    Active member of club or organization: Not on file    Attends meetings of clubs or organizations: Not on file    Relationship status: Not on file  . Intimate partner violence    Fear of current or ex partner: Not on file    Emotionally abused: Not on file    Physically abused: Not on file    Forced sexual activity: Not on file  Other Topics Concern  . Not on file  Social History Narrative   03-09-08 Unable to ask husband with her today.   Family History  Problem Relation Age of Onset  . Cancer Mother        breast  . Cancer Father        esophageal  . Cancer Sister  breast    VITAL SIGNS BP (!) 95/58   Pulse 83   Temp 98.3 F (36.8 C)   Resp 18   Ht 5' 1" (1.549 m)   Wt 105 lb (47.6 kg)   BMI 19.84 kg/m   Patient's Medications  New Prescriptions   No medications on file  Previous Medications   ACETAMINOPHEN (TYLENOL) 325 MG TABLET    Take 325 mg by mouth every 6 (six) hours as needed for mild pain or moderate pain.    ALBUTEROL SULFATE 108 (90 BASE) MCG/ACT AEPB    Inhale 2 puffs into the lungs every 6 (six) hours as needed.   CHOLECALCIFEROL (VITAMIN D3) 25 MCG (1000 UT) TABLET    Take 1,000 Units by mouth daily.   CIPROFLOXACIN (CIPRO) 250 MG TABLET    Take 250 mg by mouth 2 (two) times daily.   DULOXETINE (CYMBALTA) 30 MG CAPSULE    Take 1 capsule (30 mg total) by mouth daily.   FEEDING SUPPLEMENT, ENSURE ENLIVE, (ENSURE ENLIVE) LIQD    Take 237 mLs by mouth 2 (two) times daily between meals.   GABAPENTIN (NEURONTIN) 300 MG CAPSULE    Take 1 capsule (300 mg total) by mouth at bedtime.   LORAZEPAM (ATIVAN) 0.5 MG TABLET    Take 1 tablet (0.5 mg total) by mouth as  needed for anxiety (take one tablet 30 minutes prior to MRI scans and radiation treatment).   NIVOLUMAB IV    Inject 1 Dose into the vein every 28 (twenty-eight) days.   NON FORMULARY    Diet: Regular   OMEGA-3 FATTY ACIDS (FISH OIL OMEGA-3 PO)    Take 1 capsule by mouth daily.    OSTOMY SUPPLIES (SKIN PREP WIPES) MISC    Apply to bilateral heels every shift for prevention   PROBIOTIC PRODUCT (RISA-BID PROBIOTIC) TABS    Take 1 tablet by mouth 2 (two) times daily.   PROCHLORPERAZINE (COMPAZINE) 10 MG TABLET    Take 1 tablet (10 mg total) by mouth every 6 (six) hours as needed for nausea or vomiting.  Modified Medications   No medications on file  Discontinued Medications   No medications on file     SIGNIFICANT DIAGNOSTIC EXAMS  PREVIOUS   02-14-19: left knee x-ray:  1. Moderate to large joint effusion with evidence of a fat fluid level. This suggests a fracture, although a fracture is not visualized. Patient may have occult fracture.  2. Mild-to-moderate osteoarthritis.  02-14-19: ct of left knee:  1. Extensive comminuted nondisplaced fractures of throughout the entirety of the patella. 2. Moderate lipohemarthrosis 3. patellar [place]  02-14-19: left hip x-ray: No fracture, dislocation or acute finding.   02-14-19: left rib with chest x-ray:  1. No convincing acute rib fracture.  No rib lesion. 2. Left upper lobe collapse similar to the prior radiographs and CT. There is increased interstitial thickening in the left mid to upper lung. This may be due to asymmetric interstitial edema or lymphatic obstruction versus interstitial infection/inflammation. Other prominent interstitial markings seen diffusely and bilaterally are stable from exam.   02-14-19: ct of head:  1. No acute intracranial abnormality 2. Findings consistent with age related atrophy and chronic small vessel ischemia  NO NEW EXAMS.     LABS REVIEWED PREVIOUS:   02-12-19: tsh 0.521; vit B12: 570 02-14-19: wbc 8.6; hgb  11.2; hct 35.1; mcv 101.4; plt 149; glucose 150; bun 11; creat 0.42; k+ 3.9; na++ 129; ca 8.2; liver normal albumin 3.2; mag  1.8: urine culture: e-coli  NO NEW LABS.   Review of Systems  Constitutional: Negative for malaise/fatigue.  Respiratory: Negative for cough and shortness of breath.   Cardiovascular: Negative for chest pain, palpitations and leg swelling.  Gastrointestinal: Negative for abdominal pain, constipation and heartburn.  Musculoskeletal: Negative for back pain, joint pain and myalgias.  Skin: Negative.   Neurological: Negative for dizziness.  Psychiatric/Behavioral: The patient is not nervous/anxious.     Physical Exam Constitutional:      General: She is not in acute distress.    Appearance: She is well-developed. She is not diaphoretic.     Comments: Frail   Neck:     Musculoskeletal: Neck supple.     Thyroid: No thyromegaly.  Cardiovascular:     Rate and Rhythm: Normal rate and regular rhythm.     Pulses: Normal pulses.     Heart sounds: Normal heart sounds.  Pulmonary:     Effort: Pulmonary effort is normal. No respiratory distress.     Breath sounds: Wheezing present.  Abdominal:     General: Bowel sounds are normal. There is no distension.     Palpations: Abdomen is soft.     Tenderness: There is no abdominal tenderness.  Musculoskeletal:     Right lower leg: No edema.     Left lower leg: No edema.     Comments: Has left lower extremity with splint in place Is able to move all extremities  History of right hip arthroplasty    Lymphadenopathy:     Cervical: No cervical adenopathy.  Skin:    General: Skin is warm and dry.  Neurological:     Mental Status: She is alert and oriented to person, place, and time.  Psychiatric:        Mood and Affect: Mood normal.       ASSESSMENT/ PLAN:    Patient is being discharged with the following home health services:  Hospice care   Patient is being discharged with the following durable medical  equipment:  None needed   Patient has been advised to f/u with their PCP in 1-2 weeks to bring them up to date on their rehab stay.  Social services at facility was responsible for arranging this appointment.  Pt was provided with a 30 day supply of prescriptions for medications and refills must be obtained from their PCP.  For controlled substances, a more limited supply may be provided adequate until PCP appointment only.  Her medications will be provided via hospice care  Time spent with patient: 35 minutes: medications dme; home health; hospice care  Ok Edwards NP [facility]  Contact [phone] Monday through Friday 8am- 5pm  After hours call [phone]

## 2019-02-24 ENCOUNTER — Ambulatory Visit: Payer: Medicare Other

## 2019-02-24 ENCOUNTER — Other Ambulatory Visit: Payer: Medicare Other

## 2019-02-24 ENCOUNTER — Ambulatory Visit: Payer: Medicare Other | Admitting: Internal Medicine

## 2019-03-08 DEATH — deceased

## 2019-03-24 ENCOUNTER — Ambulatory Visit: Payer: Medicare Other | Admitting: Internal Medicine

## 2019-03-24 ENCOUNTER — Other Ambulatory Visit: Payer: Medicare Other

## 2019-03-24 ENCOUNTER — Ambulatory Visit: Payer: Medicare Other

## 2019-03-31 ENCOUNTER — Telehealth: Payer: Self-pay | Admitting: *Deleted

## 2019-03-31 NOTE — Telephone Encounter (Signed)
Patient deceased as of 03-29-19

## 2019-05-09 ENCOUNTER — Ambulatory Visit: Payer: Medicare Other | Admitting: Internal Medicine

## 2019-05-13 ENCOUNTER — Encounter: Payer: Self-pay | Admitting: Radiation Therapy

## 2019-05-13 NOTE — Progress Notes (Signed)
Clip from obituary - pt expired 03-24-19    Rayan J Weslaco, PARSONS) [place], of [place], [place] formerly of [place] passed away on 03-24-19 after a long courageous battle with cancer. Jacqueline Kidd was a devoted, loving and caring wife, mother and grandmother. Her beautiful smile, kind words and hugs will be missed by all. 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 Jacqueline Kidd was born on Aug 07, 1940 to the late [place] and [place]. Also proceeding her in death was sister William Hamburger, brother Mariana Arn and Link Snuffer. Jacqueline Kidd is survived by her husband Jacqueline Kidd; sons Jacqueline Kidd) and Jacqueline Kidd (Angie); stepsons Jacqueline Kidd ([facility]) and Jacqueline Kidd; two brothers Jacqueline Kidd) and Jacqueline Kidd); six grandchildren and three great grandchildren. Special thanks to Jacqueline Kidd and Jacqueline Kidd. There will be a private family memorial at a later date.

## 2020-09-10 IMAGING — CT CT HEAD WITHOUT CONTRAST
4 series · 16 of 47 positions shown, 18 images · non-contrast
Comparison: Brain MRI January 28, 2019

CLINICAL DATA: Altered level of consciousness.

EXAM:
CT HEAD WITHOUT CONTRAST
TECHNIQUE: Contiguous axial images were obtained from the base of the skull
through the vertex without intravenous contrast.

[Series 2: head w o · axial · 0.41mm/px · z∈[+9,+124]mm · 7 of 31 slices shown, 9 images]
[im 4/31  brain]
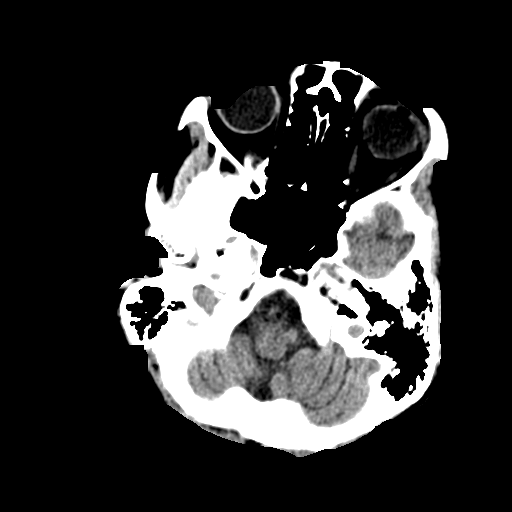
[im 4/31  bone]
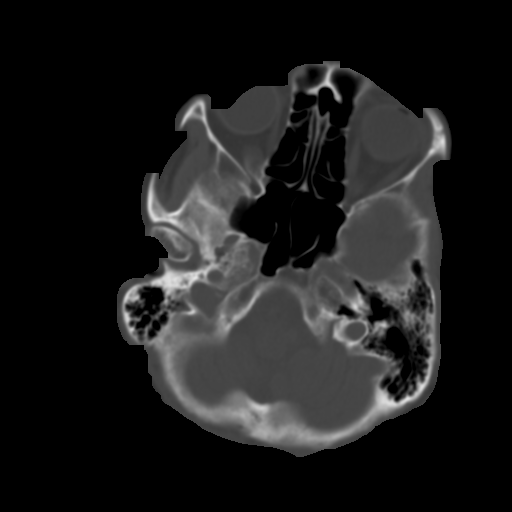
[im 8/31  brain]
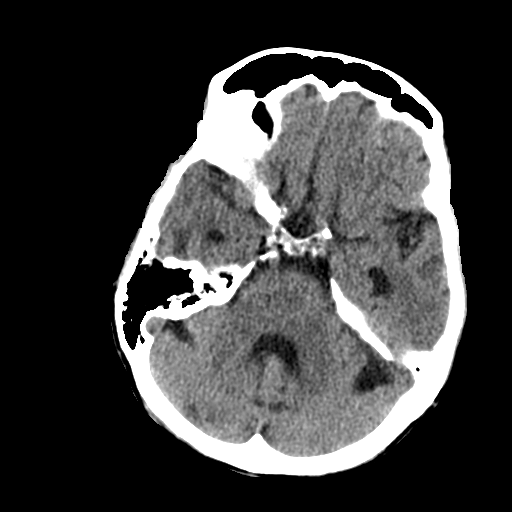
[im 12/31  brain]
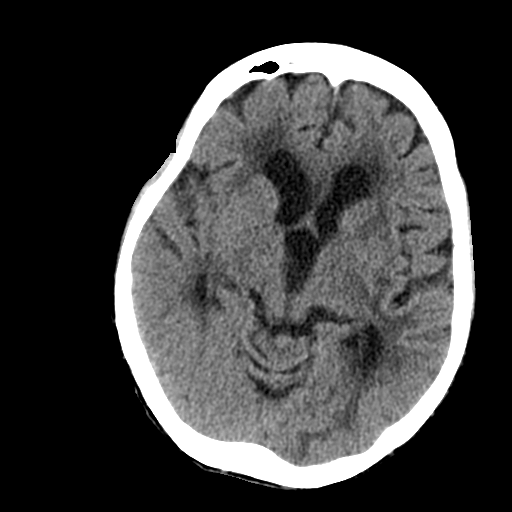
[im 16/31  brain]
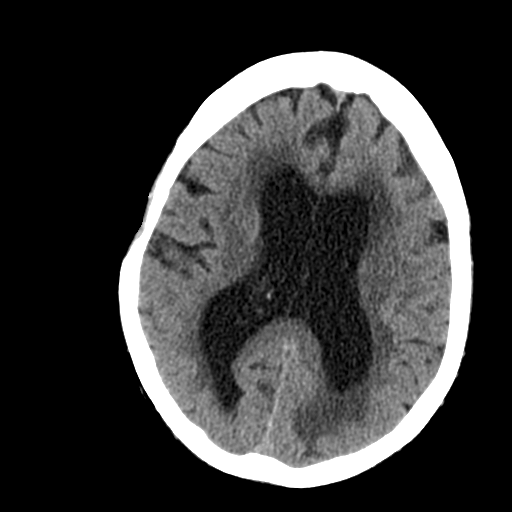
[im 19/31  brain]
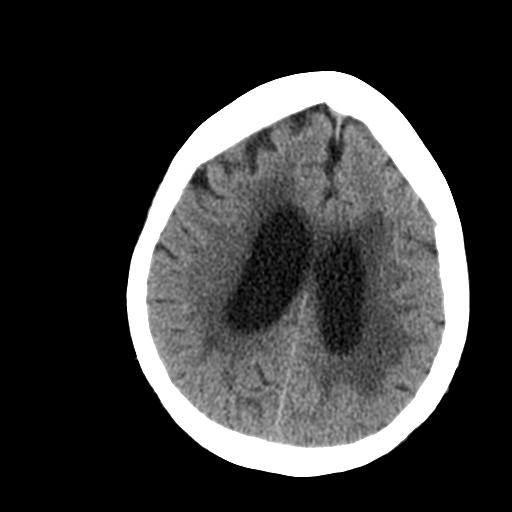
[im 19/31  bone]
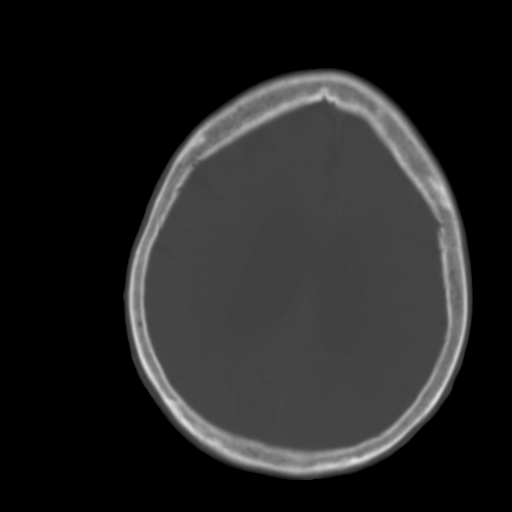
[im 23/31  brain]
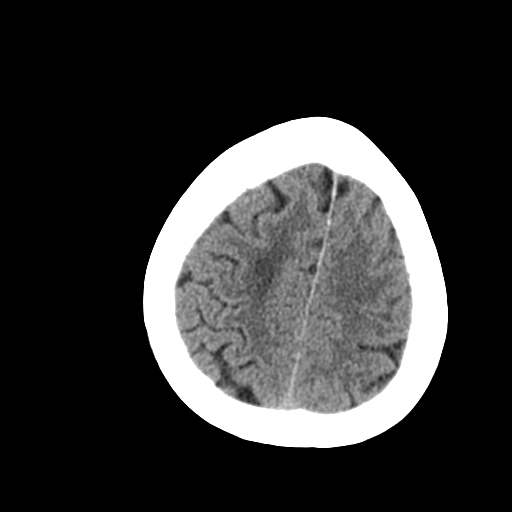
[im 27/31  brain]
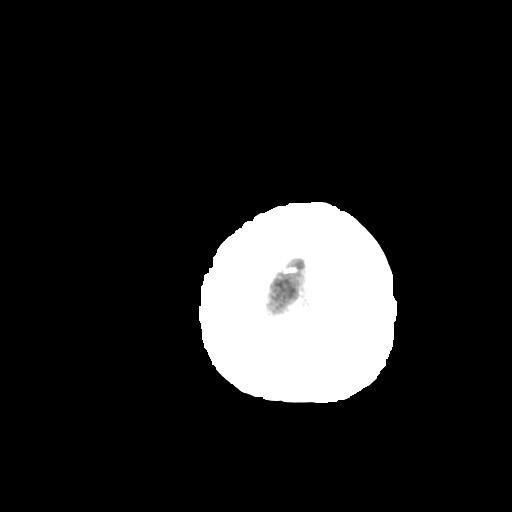

[Series 3: head bone · axial · 0.41mm/px · z∈[+8,+40]mm · 3 of 78 slices shown]
[im 8/78  bone]
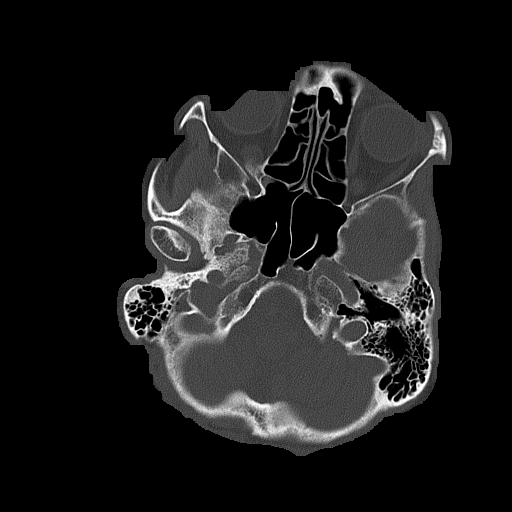
[im 16/78  bone]
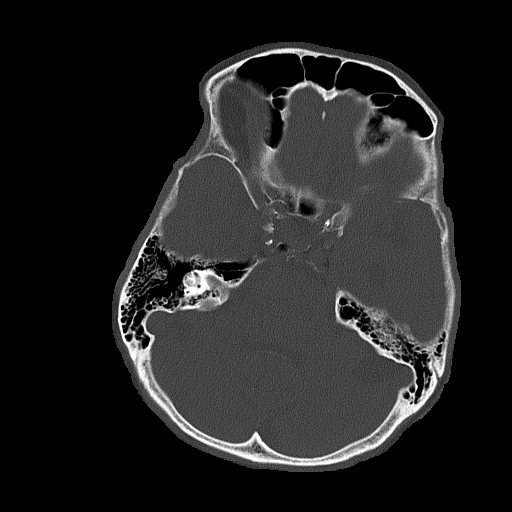
[im 24/78  bone]
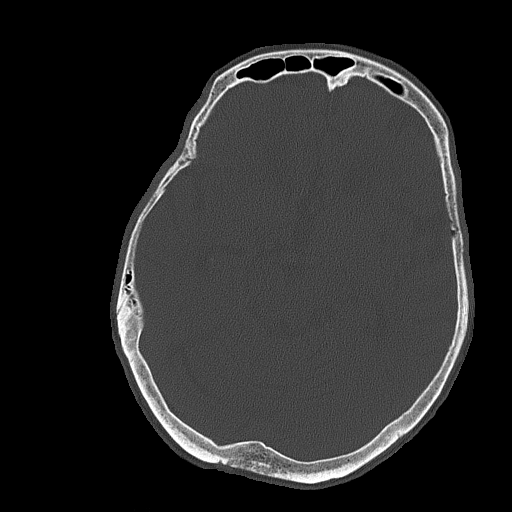

[Series 4: coronal soft · coronal · 0.29mm/px · 3 of 70 slices shown]
[im 24/70  brain]
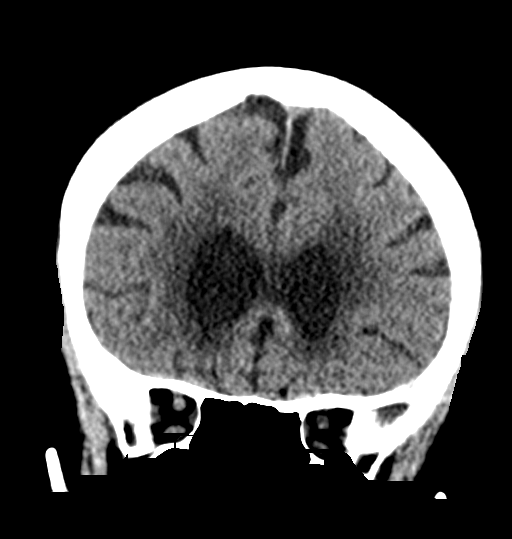
[im 31/70  brain]
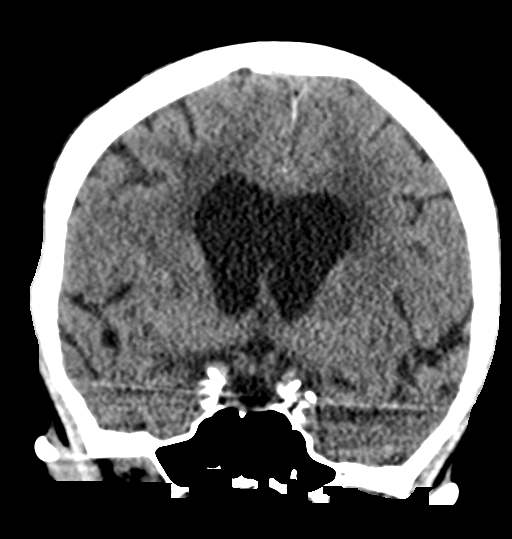
[im 39/70  brain]
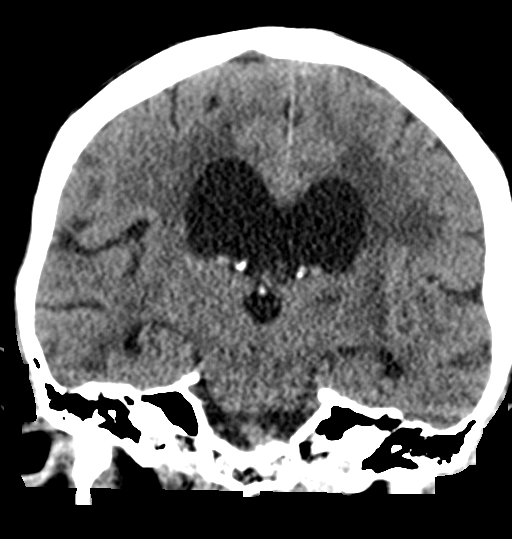

[Series 5: sagittal soft · sagittal · 0.32mm/px · 3 of 52 slices shown]
[im 18/52  brain]
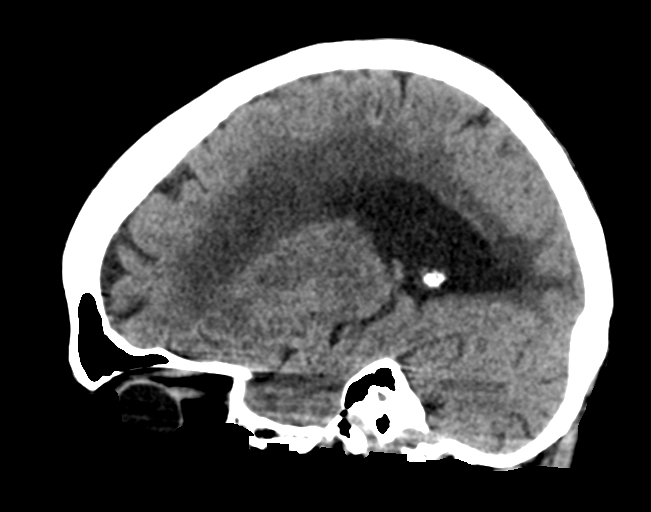
[im 26/52  brain]
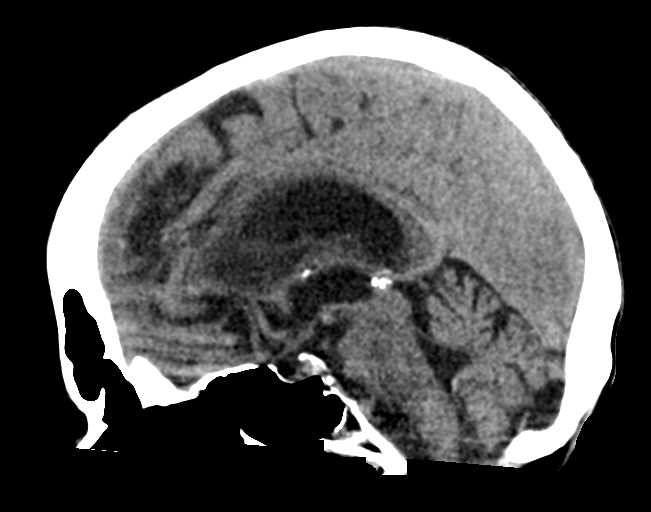
[im 35/52  brain]
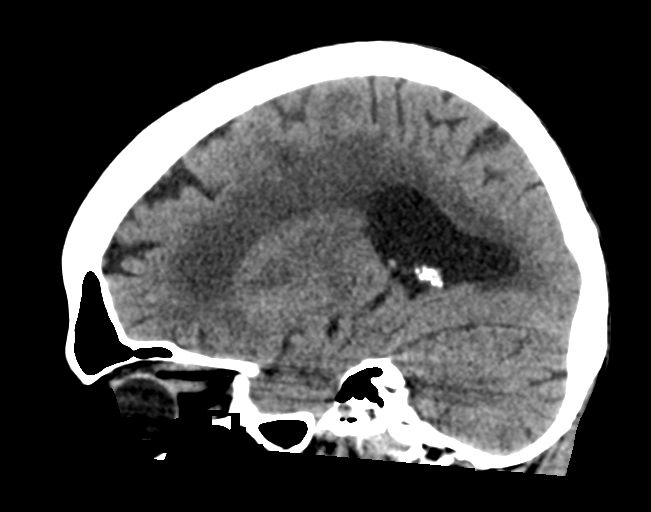

[16 of 47 positions shown; findings below may reference images not displayed]

FINDINGS: Brain: No evidence of acute infarction, or hemorrhage. Stable to
mildly increased dilation of the ventricles, accounting for cross
modality comparison. Multiple brain metastatic lesions previously
demonstrated by MRI are poorly seen. Moderate brain parenchymal
volume loss and deep white matter areas of hypoattenuation, which in
correlation to recent brain MRI were thought to represent
combination of sequela of radiation therapy and microvascular
ischemia. Stable sequela of prior lacunar left thalamic infarct.

Vascular: Intracranial calcific atherosclerotic disease.

Skull: Normal. Negative for fracture or focal lesion.

Sinuses/Orbits: No acute finding.

Other: None.
IMPRESSION: 1. No acute intracranial abnormality.
2. Stable to mildly increased dilation of the ventricles.
3. Multiple brain metastatic lesions previously demonstrated by MRI
are poorly seen.
4. Moderate brain parenchymal volume loss and deep white matter
areas of hypoattenuation, which in correlation to recent brain MRI
were thought to represent combination of sequela of radiation
therapy and microvascular ischemia.

## 2020-09-10 IMAGING — CR PORTABLE CHEST - 1 VIEW
1 series · 1 of 1 positions shown · non-contrast
Comparison: CT scan of the chest dated 01/17/2019

CLINICAL DATA: Acute metabolic encephalopathy. Confusion.

EXAM:
PORTABLE CHEST 1 VIEW

[portable]
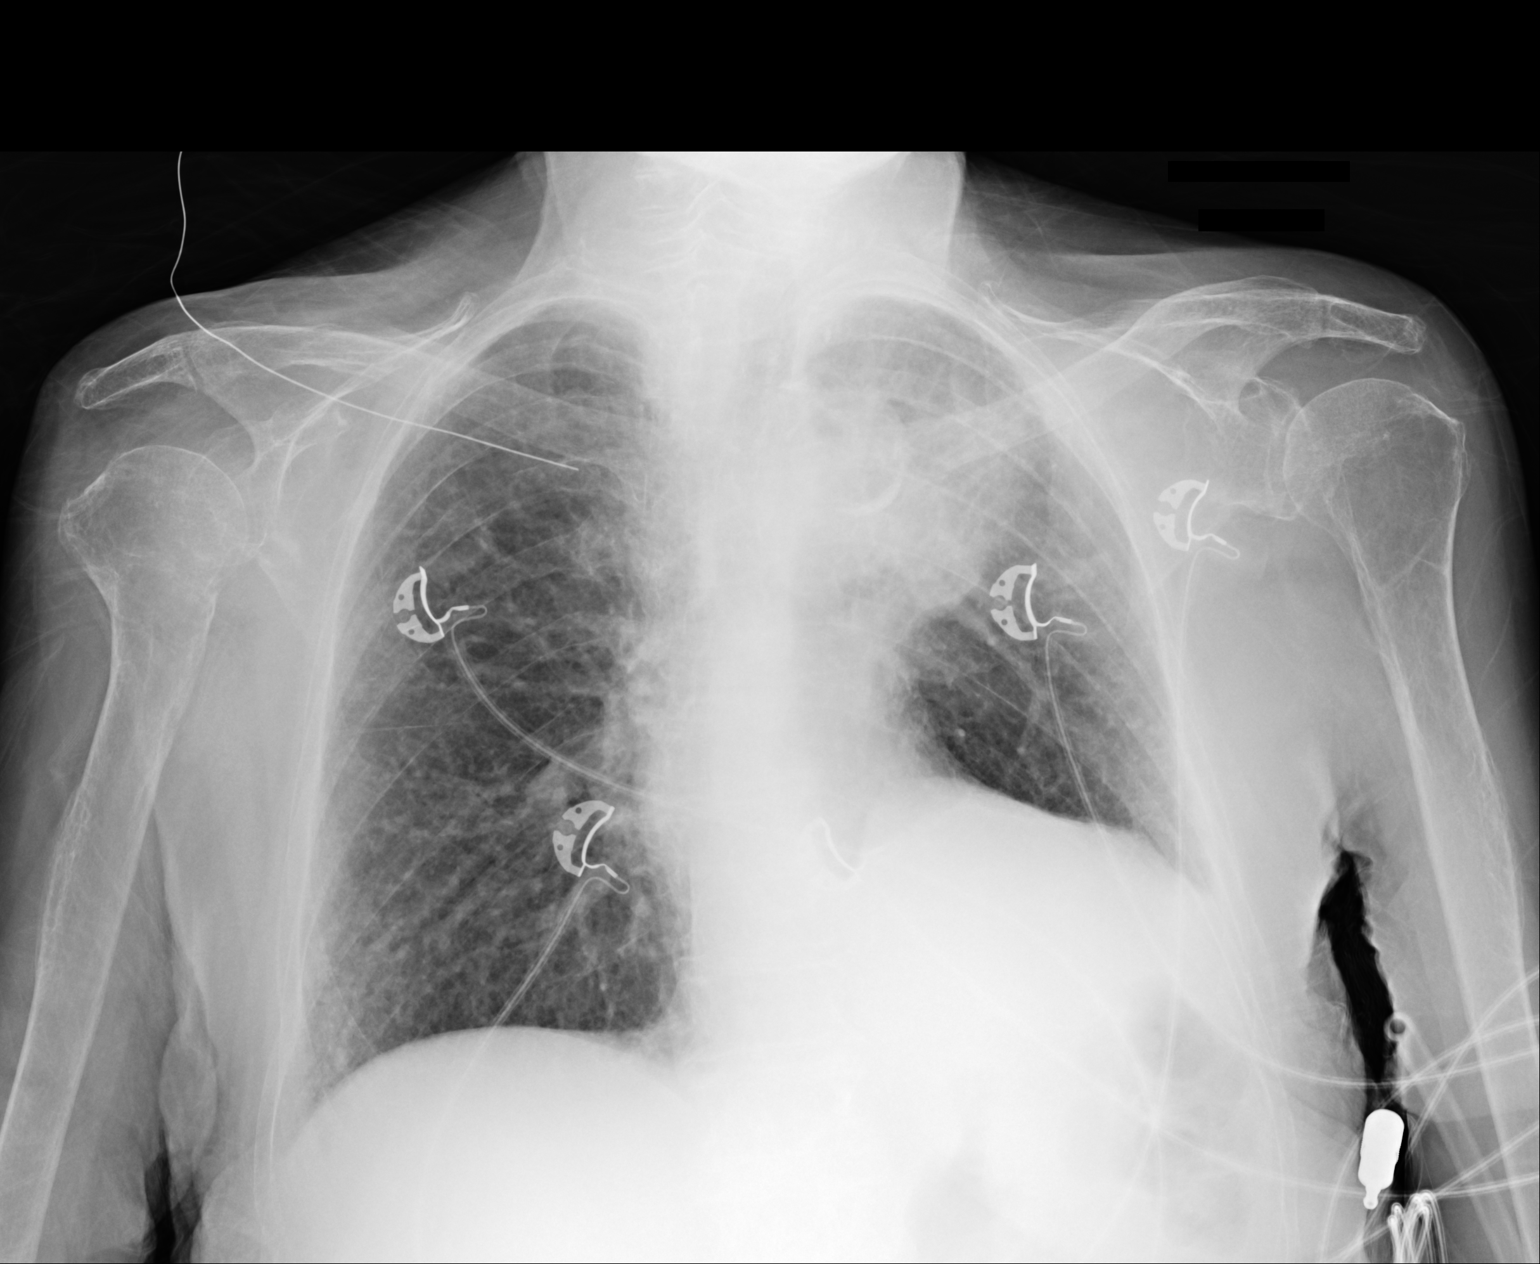

[1 of 1 positions shown; findings below may reference images not displayed]

FINDINGS: The heart size and pulmonary vascularity are normal. Aortic
atherosclerosis. There is chronic consolidation and volume loss in
the left upper lobe at the site of the known non-small cell
carcinoma. There is also chronic elevation of the left
hemidiaphragm.

Right lung is clear. No acute bone abnormality. Old healed fracture
of the proximal right humerus.
IMPRESSION: 1. Chronic consolidation and volume loss in the left upper lobe at
the site of the known non-small cell carcinoma. Chronic elevation of
the left hemidiaphragm.
2. No acute abnormalities.
3. Aortic atherosclerosis.
